# Patient Record
Sex: Female | Born: 1945 | Race: White | Hispanic: No | Smoking: Former smoker
Health system: Southern US, Community
[De-identification: ages and names within clinical notes are randomized; demographics above are authoritative.]

## PROBLEM LIST (undated history)

## (undated) DIAGNOSIS — R918 Other nonspecific abnormal finding of lung field: Secondary | ICD-10-CM

## (undated) DIAGNOSIS — F17200 Nicotine dependence, unspecified, uncomplicated: Secondary | ICD-10-CM

## (undated) DIAGNOSIS — J309 Allergic rhinitis, unspecified: Secondary | ICD-10-CM

## (undated) DIAGNOSIS — R0600 Dyspnea, unspecified: Secondary | ICD-10-CM

## (undated) DIAGNOSIS — R61 Generalized hyperhidrosis: Secondary | ICD-10-CM

## (undated) DIAGNOSIS — J189 Pneumonia, unspecified organism: Secondary | ICD-10-CM

## (undated) DIAGNOSIS — E785 Hyperlipidemia, unspecified: Secondary | ICD-10-CM

## (undated) DIAGNOSIS — J45909 Unspecified asthma, uncomplicated: Secondary | ICD-10-CM

## (undated) DIAGNOSIS — F4321 Adjustment disorder with depressed mood: Secondary | ICD-10-CM

## (undated) DIAGNOSIS — I10 Essential (primary) hypertension: Secondary | ICD-10-CM

## (undated) DIAGNOSIS — Z8701 Personal history of pneumonia (recurrent): Secondary | ICD-10-CM

## (undated) DIAGNOSIS — C801 Malignant (primary) neoplasm, unspecified: Secondary | ICD-10-CM

## (undated) DIAGNOSIS — L299 Pruritus, unspecified: Secondary | ICD-10-CM

## (undated) DIAGNOSIS — F419 Anxiety disorder, unspecified: Secondary | ICD-10-CM

## (undated) DIAGNOSIS — J449 Chronic obstructive pulmonary disease, unspecified: Secondary | ICD-10-CM

## (undated) HISTORY — DX: Hyperlipidemia, unspecified: E78.5

## (undated) HISTORY — DX: Nicotine dependence, unspecified, uncomplicated: F17.200

## (undated) HISTORY — DX: Other nonspecific abnormal finding of lung field: R91.8

## (undated) HISTORY — DX: Pruritus, unspecified: L29.9

## (undated) HISTORY — DX: Chronic obstructive pulmonary disease, unspecified: J44.9

## (undated) HISTORY — DX: Generalized hyperhidrosis: R61

## (undated) HISTORY — DX: Essential (primary) hypertension: I10

## (undated) HISTORY — DX: Allergic rhinitis, unspecified: J30.9

## (undated) HISTORY — DX: Unspecified asthma, uncomplicated: J45.909

## (undated) HISTORY — DX: Adjustment disorder with depressed mood: F43.21

---
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
## 1973-08-20 HISTORY — PX: TOTAL ABDOMINAL HYSTERECTOMY W/ BILATERAL SALPINGOOPHORECTOMY: SHX83

## 1998-07-12 ENCOUNTER — Ambulatory Visit (HOSPITAL_COMMUNITY): Admission: RE | Admit: 1998-07-12 | Discharge: 1998-07-12 | Payer: Self-pay | Admitting: Gastroenterology

## 2000-10-29 ENCOUNTER — Encounter: Admission: RE | Admit: 2000-10-29 | Discharge: 2000-10-29 | Payer: Self-pay | Admitting: Family Medicine

## 2000-10-29 ENCOUNTER — Encounter: Payer: Self-pay | Admitting: Family Medicine

## 2000-11-26 ENCOUNTER — Encounter: Payer: Self-pay | Admitting: Family Medicine

## 2000-11-26 ENCOUNTER — Encounter: Admission: RE | Admit: 2000-11-26 | Discharge: 2000-11-26 | Payer: Self-pay | Admitting: Family Medicine

## 2002-08-31 ENCOUNTER — Ambulatory Visit (HOSPITAL_COMMUNITY): Admission: RE | Admit: 2002-08-31 | Discharge: 2002-08-31 | Payer: Self-pay | Admitting: Gastroenterology

## 2002-08-31 ENCOUNTER — Encounter: Payer: Self-pay | Admitting: Gastroenterology

## 2003-01-27 ENCOUNTER — Encounter: Payer: Self-pay | Admitting: *Deleted

## 2003-01-28 ENCOUNTER — Encounter: Payer: Self-pay | Admitting: Cardiology

## 2003-01-28 ENCOUNTER — Observation Stay (HOSPITAL_COMMUNITY): Admission: EM | Admit: 2003-01-28 | Discharge: 2003-01-28 | Payer: Self-pay | Admitting: *Deleted

## 2005-09-10 ENCOUNTER — Encounter: Admission: RE | Admit: 2005-09-10 | Discharge: 2005-09-10 | Payer: Self-pay | Admitting: Family Medicine

## 2006-08-20 DIAGNOSIS — J45909 Unspecified asthma, uncomplicated: Secondary | ICD-10-CM

## 2006-08-20 HISTORY — DX: Unspecified asthma, uncomplicated: J45.909

## 2007-03-04 ENCOUNTER — Encounter: Admission: RE | Admit: 2007-03-04 | Discharge: 2007-03-04 | Payer: Self-pay | Admitting: Family Medicine

## 2011-01-05 NOTE — H&P (Signed)
NAME:  Pamela Russell, Pamela Russell                        ACCOUNT NO.:  [id number]   MEDICAL RECORD NO.:  [id number]                   PATIENT TYPE:  OBV   LOCATION:  3731                                 FACILITY:  [facility]   PHYSICIAN:  Peter M. Swaziland, M.D.               DATE OF BIRTH:  [birth year]   DATE OF ADMISSION:  01/28/2003  DATE OF DISCHARGE:                                HISTORY & PHYSICAL   HISTORY OF PRESENT ILLNESS:  The patient is a 65 year old white woman who  was admitted to [facility] for further evaluation of chest pain.   The patient has no past history of cardiac disease other than mitral valve  prolapse.  She presented to the emergency department after having two  episodes of chest pain.  These episodes occurred while she was eating dinner  at work.  The chest pain was described as a sharp, focal discomfort located  in the left inframammary region.  It radiated up the left side of her body.  It lased several seconds.  Several minutes later, she experience another  similar episode.  It was also fleeting in duration.  The chest pain was not  associated with dyspnea, diaphoresis or nausea.  There were no exacerbating  or ameliorating factors.  It appeared not to be related to position,  activity, meals or respiration.  The patient did take nitroglycerin (which  she was given in the remote past for chest pain thought to be related to her  mitral valve prolapse) but her chest pain had already resolved by the time  that she took it.   As noted, the patient has a history of mitral valve prolapse.  In the remote  past, she had experienced episodes of chest pain.  These were attributed to  the mitral valve prolapse.  She was given nitroglycerin but had not used it  in a number of years.  The patient had no history of coronary artery  disease, myocardial infarction, congestive heart failure, or arrhythmia.   The patient has a history of hypertension and chronic obstructive  pulmonary  disease.  She smokes one pack of cigarettes a day.  There is no history of  hyperlipidemia, diabetes mellitus, or family history of early coronary  artery disease.   ALLERGIES:  The patient is reportedly allergic to codeine and penicillin.   MEDICATIONS:  She takes a number of medications but is not sure of their  names.  She takes Altace, Premarin, several inhalers, and one other blood  pressure medication.  The name is not known at this time.   SOCIAL HISTORY:  The patient lives with her son.  She is widowed.   FAMILY HISTORY:  Noncontributory.   REVIEW OF SYSTEMS:  No problems related to her head, eyes, ears, nose,  mouth, throat, lungs, gastrointestinal system, or extremities.  There is no  history of neurologic or psychiatric disorder.  There  is no history of  fever, chills, or weight loss.   PHYSICAL EXAMINATION:  VITAL SIGNS:  Blood pressure 169/83, pulse 80 and  regular, respirations 18, temperature 97.1.  GENERAL:  The patient was a thin, middle-aged white woman in no discomfort.  She was alert, oriented, appropriate, and responsive.  HEENT:  Head, eyes, nose and mouth were unremarkable.  NECK:  Without thyromegaly or adenopathy.  Carotid pulses were palpable  bilaterally and without bruits.  CARDIAC:  Examination revealed a normal S1 and S2.  There was no S3, S4,  murmur, rub or click.  Cardiac rhythm was regular.  No chest wall tenderness  was noted.  LUNGS:  Clear.  ABDOMEN:  Soft, nontender.  There was no mas, hepatosplenic, bruits,  distension, rebound, guarding or rigidity.  Bowel sounds are normal.  BREASTS/PELVIS AND RECTAL:  Examinations were not performed as they were not  pertinent to the reason for acute-care hospitalization.  EXTREMITIES:  Without edema, deviation, or deformity.  Radials are palpable.  Pedal pulses were palpable bilaterally.  NEUROLOGIC:  Brief screening neurological survey was unremarkable.   Echocardiogram revealed normal  sinus rhythm. Q-M's were present in V2 and V3  consistent with a prior anteroseptal myocardial infarction.   The chest radiograph revealed a small left upper-lobe nodule.  There was no  focal infiltrate or edema.   LABORATORY DATA:  Initial CK was 37 with CK-MB of 0.9 and troponin 0.01.  White count was 7.7 with a hemoglobin of 12.7 and hematocrit of 36.9.  Potassium was 3.6, BUN 13, creatinine 0.7.  The remaining studies were  pending at the time of this dictation.   IMPRESSION:  1. Chest pain, rule out coronary artery disease.  The pain was sharp,     fleeting and located in a focal region in the left inframmary area.     However, there are anteroseptal Q waves on her electrocardiogram.  2. History of mitral valve prolapse.  3. Hypertension.  4. Chronic obstructive pulmonary disease.  5. Left upper-lobe nodule on chest radiograph.   PLAN:  1. Telemetry.  2. Serial cardiac enzymes.  3. Aspirin.  4. Nitroglycerin paste.  5. Echocardiogram to assess mitral valve prolapse and wall motion.  6. Adenosine Cardiolite.  7.     Fasting lipid profile.  8. Discontinue smoking.  9. Check CT and follow up left upper lobe nodule.     Quita Skye. Waldon Reining, MD                   Peter M. Swaziland, M.D.    MSC/MEDQ  D:  01/28/2003  T:  01/28/2003  Job:  [id number]   cc:   Quita Skye. Waldon Reining, MD  [address]  [place], [place] [address]  Fax: [phone]   Peter M. Swaziland, M.D.  [address], [address]  [place], [place] [address]  Fax: [phone]

## 2011-01-05 NOTE — Discharge Summary (Signed)
NAME:  Pamela Russell, Pamela Russell                        ACCOUNT NO.:  [id number]   MEDICAL RECORD NO.:  [id number]                   PATIENT TYPE:  OBV   LOCATION:  3731                                 FACILITY:  [facility]   PHYSICIAN:  Peter M. [place]aziland, M.D.               DATE OF BIRTH:  June 21, 1946   DATE OF ADMISSION:  01/28/2003  DATE OF DISCHARGE:  01/28/2003                                 DISCHARGE SUMMARY   HISTORY OF PRESENT ILLNESS:  [name] is a 65 year old white female with  a history of tobacco abuse, COPD, and hypertension who presented with  atypical chest pain, described as a focal pain beneath her left breast that  was sharp and fleeting in character.  Because of her risk factors, she was  admitted for further evaluation.  For details of her past medical history,  social history, family history, and physical exam, please see admission  history and physical.   LABORATORY DATA:  ECG showed poor R-wave progression in V1 through V3;  otherwise normal.  White count was 7700, hemoglobin 12.7, hematocrit 36.8,  platelets 190,000.  Sodium 141, potassium 3.6, chloride 106, CO2 of 27, BUN  13, creatinine 0.7, glucose 133.  Coags were normal.  CPK-MB and troponin  were negative times three.  Magnesium was normal at 2.1.  Chest x-ray showed  a tiny left upper lobe nodular density; otherwise, no acute process.  Lipid  panel showed cholesterol 186, LDL of 105, HDL of 60, and triglycerides of  107.   HOSPITAL COURSE:  The patient was admitted to telemetry; she ruled out for  myocardial infarction.  She was treated with aspirin and nitro paste.  She  subsequently underwent an adenosine Cardiolite study, which was normal,  showing no perfusion abnormalities and an ejection fraction of 72%.  She  also had an echocardiogram, which was a normal study, showing normal left  ventricular function and no valvular abnormalities; specifically, there was  no evidence of mitral valve prolapse.  A CT of  the chest was also performed,  which showed no pulmonary nodules or other acute pathology; it was noted  that she had extensive coronary calcification.  Based on these studies, it  was felt that her pain was noncardiac.  She was discharged home on the same  day.  It was recommended that she continue on her prior medications and stop  smoking.   DISCHARGE DIAGNOSES:  1. Atypical chest pain.  2. Tobacco abuse.  3. Chronic obstructive pulmonary disease.  4. Hypertension.   RECOMMENDATIONS:   DISCHARGE MEDICATIONS:  We will continue her prior medications, which  include:  1. Altace 10 mg daily.  2. Premarin 0.625 mg daily.  3. Norvasc 10 mg per day.  4. Advair 250/50 mcg one puff b.i.d.  5. Albuterol inhaler p.r.n.   DISCHARGE INSTRUCTIONS:  She was encouraged to stop smoking.   DIET:  I recommended a low-fat  diet.    FOLLOW UP:  Will follow up with Dr. Arvilla Market in two weeks.   DISCHARGE STATUS:  Improved.                                               Peter M. Swaziland, M.D.    PMJ/MEDQ  D:  01/28/2003  T:  01/29/2003  Job:  [id number]   cc:   Donia Guiles, M.D.  [address] [place]  [place] [address]  Fax: [phone]

## 2013-04-27 ENCOUNTER — Ambulatory Visit (INDEPENDENT_AMBULATORY_CARE_PROVIDER_SITE_OTHER): Payer: Medicare Other | Admitting: Emergency Medicine

## 2013-04-27 VITALS — BP 168/80 | HR 100 | Temp 99.0°F | Resp 20 | Ht 63.0 in | Wt 110.0 lb

## 2013-04-27 DIAGNOSIS — L84 Corns and callosities: Secondary | ICD-10-CM

## 2013-04-27 NOTE — Progress Notes (Signed)
[facility] [address] [place] [address] [phone]  Date:  04/27/2013   Name:  Pamela Russell   DOB:  23-Nov-1945   MRN:  [id number]  PCP:  Hassan Rowan, MD    Chief Complaint: Callouses   History of Present Illness:  Pamela Russell is a 67 y.o. very pleasant female patient who presents with the following:  Has a corn between her great and second toe on apppositional surfaces on left for months and is experiencing pain and difficulty wearing shoes.  No improvement with over the counter medications or other home remedies. Denies other complaint or health concern today.   There are no active problems to display for this patient.   History reviewed. No pertinent past medical history.  History reviewed. No pertinent past surgical history.  History  Substance Use Topics  . Smoking status: Current Every Day Smoker  . Smokeless tobacco: Not on file  . Alcohol Use: Not on file    Family History  Problem Relation Age of Onset  . Heart disease Mother   . Heart disease Father     Allergies  Allergen Reactions  . Codeine Swelling  . Penicillins Swelling    Medication list has been reviewed and updated.  No current outpatient prescriptions on file prior to visit.   No current facility-administered medications on file prior to visit.    Review of Systems:  As per HPI, otherwise negative.    Physical Examination: Filed Vitals:   04/27/13 1446  BP: 168/80  Pulse: 100  Temp: 99 F (37.2 C)  Resp: 20   Filed Vitals:   04/27/13 1446  Height: 5\' 3"  (1.6 m)  Weight: 110 lb (49.896 kg)   Body mass index is 19.49 kg/(m^2). Ideal Body Weight: Weight in (lb) to have BMI = 25: 140.8   GEN: WDWN, NAD, Non-toxic, Alert & Oriented x 3 HEENT: Atraumatic, Normocephalic.  Ears and Nose: No external deformity. EXTR: No clubbing/cyanosis/edema NEURO: Normal gait.  PSYCH: Normally interactive. Conversant. Not depressed or anxious appearing.   Calm demeanor.  LEFT foot corn on first and second toe.  Assessment and Plan: Corn Podiatrist Salicylic acid plasters. Signed,  Phillips Odor, MD

## 2013-04-27 NOTE — Patient Instructions (Addendum)

## 2016-05-08 HISTORY — PX: CATARACT EXTRACTION: SUR2

## 2016-05-22 HISTORY — PX: CATARACT EXTRACTION: SUR2

## 2016-06-08 ENCOUNTER — Ambulatory Visit (INDEPENDENT_AMBULATORY_CARE_PROVIDER_SITE_OTHER): Payer: Medicare Other | Admitting: Family Medicine

## 2016-06-08 ENCOUNTER — Encounter: Payer: Self-pay | Admitting: Family Medicine

## 2016-06-08 VITALS — BP 168/92 | HR 94 | Temp 97.7°F | Ht 63.0 in | Wt 108.6 lb

## 2016-06-08 DIAGNOSIS — R61 Generalized hyperhidrosis: Secondary | ICD-10-CM | POA: Insufficient documentation

## 2016-06-08 DIAGNOSIS — R7309 Other abnormal glucose: Secondary | ICD-10-CM

## 2016-06-08 DIAGNOSIS — J449 Chronic obstructive pulmonary disease, unspecified: Secondary | ICD-10-CM

## 2016-06-08 DIAGNOSIS — J4520 Mild intermittent asthma, uncomplicated: Secondary | ICD-10-CM

## 2016-06-08 DIAGNOSIS — I10 Essential (primary) hypertension: Secondary | ICD-10-CM | POA: Diagnosis not present

## 2016-06-08 DIAGNOSIS — J45909 Unspecified asthma, uncomplicated: Secondary | ICD-10-CM | POA: Insufficient documentation

## 2016-06-08 DIAGNOSIS — F4320 Adjustment disorder, unspecified: Secondary | ICD-10-CM

## 2016-06-08 DIAGNOSIS — N951 Menopausal and female climacteric states: Secondary | ICD-10-CM

## 2016-06-08 DIAGNOSIS — Z0000 Encounter for general adult medical examination without abnormal findings: Secondary | ICD-10-CM | POA: Insufficient documentation

## 2016-06-08 DIAGNOSIS — E559 Vitamin D deficiency, unspecified: Secondary | ICD-10-CM

## 2016-06-08 DIAGNOSIS — F4321 Adjustment disorder with depressed mood: Secondary | ICD-10-CM

## 2016-06-08 HISTORY — DX: Generalized hyperhidrosis: R61

## 2016-06-08 HISTORY — DX: Adjustment disorder with depressed mood: F43.21

## 2016-06-08 HISTORY — DX: Chronic obstructive pulmonary disease, unspecified: J44.9

## 2016-06-08 LAB — COMPLETE METABOLIC PANEL WITH GFR
ALBUMIN: 4.6 g/dL (ref 3.6–5.1)
ALK PHOS: 69 U/L (ref 33–130)
ALT: 16 U/L (ref 6–29)
AST: 16 U/L (ref 10–35)
BILIRUBIN TOTAL: 0.6 mg/dL (ref 0.2–1.2)
BUN: 6 mg/dL — AB (ref 7–25)
CALCIUM: 10 mg/dL (ref 8.6–10.4)
CO2: 28 mmol/L (ref 20–31)
CREATININE: 0.69 mg/dL (ref 0.50–0.99)
Chloride: 100 mmol/L (ref 98–110)
GFR, Est African American: >89 mL/min (ref >=60)
GFR, Est Non African American: 89 mL/min (ref >=60)
GLUCOSE: 102 mg/dL — AB (ref 65–99)
POTASSIUM: 4 mmol/L (ref 3.5–5.3)
SODIUM: 137 mmol/L (ref 135–146)
TOTAL PROTEIN: 7.2 g/dL (ref 6.1–8.1)

## 2016-06-08 LAB — LIPID PANEL
CHOLESTEROL: 239 mg/dL — AB (ref 125–200)
HDL: 82 mg/dL (ref >=46)
LDL Cholesterol: 137 mg/dL — ABNORMAL HIGH (ref <130)
Total CHOL/HDL Ratio: 2.9 Ratio (ref <=5.0)
Triglycerides: 101 mg/dL (ref <150)
VLDL: 20 mg/dL (ref <30)

## 2016-06-08 LAB — POCT GLYCOSYLATED HEMOGLOBIN (HGB A1C): HEMOGLOBIN A1C: 5.6

## 2016-06-08 LAB — TSH: TSH: 0.98 m[IU]/L

## 2016-06-08 MED ORDER — HYDROCHLOROTHIAZIDE 12.5 MG PO TABS: 12.5000 mg | ORAL_TABLET | Freq: Every day | ORAL | 3 refills | Status: DC

## 2016-06-08 NOTE — Assessment & Plan Note (Addendum)
Acute. Lost son 1 year ago as of next week. Patient was tearful but said she was managing fine. Does not seem to impact her ADL. Depression screen neg. Denied SI or HI. Active with friends and activities. Continues to work. Close to daughter but lives alone. - Discussed with patient need to continue to maintain active lifestyle - Educated on resources at [facility] for grieving and counseling options if needed - F/u 1 month

## 2016-06-08 NOTE — Assessment & Plan Note (Addendum)
Uncontrolled. New diagnosis. BP 169/92. Seem chronic given h/o hypertension on encounters. Continues to smoke 2 ppd. Uncertain cardiac history but seems to carry probable COPD history given meds. Used to take aspirin 81 mg daily but stopped in 2014 when told to not take it by physician. - Will start HCTZ 12.5 mg QD - CMET, A1c, lipid panel, TSH pending - Patient to schedule appointment with Dr. Valentina Lucks for smoking cessation - Educated to restrict sodium intake - F/u in 1 month

## 2016-06-08 NOTE — Progress Notes (Signed)
Subjective:   Patient ID: Pamela Russell    DOB: 1946-06-04, 70 y.o. female   MRN: [id number]  CC: Establishing Care  HPI: Pamela Russell is a 70 y.o. female who presents to clinic today to establish care as new patient. Problems discussed today are as follows:  1. Preventative Care: patient was receiving care until 2008 when PCP retired. Has not seen physician since except urgent care. Says she has no complaints today. Has received all of her immunizations. Wants to be checked for diabetes because her sister was recently diagnosed. Also wants flu shot today.   2. Grieving: says she lost her son approximately 1 year ago. Was not initially discussed until patient told me she could not follow up at clinic in 2 weeks because "she would not be good." She was upset but denies depressive feelings. She lives alone but remains active with biking and working part time. Her grievance has not caused a decrease in activity and she is thinking positive. Will be seeing daughter next week. Denies suicidal or homicidal ideations.  3. Tobacco Abuse: ongoing use since age 38. Currently smoking 2 packs per day and wish to stop. Says she has attempted to use nicotine patches and lozenges without success. Does continue to use her Spiriva for what she says is "asthma." rarely uses albuterol inhaler. Denies fevers, chest pain, dyspnea, cough, lightheadedness, fatigue.   ROS: See HPI for pertinent ROS.  [place]: Pertinent past medical, surgical, family, and social history were reviewed and updated as appropriate. Smoking status reviewed.  Medications reviewed. Current Outpatient Prescriptions  Medication Sig Dispense Refill  . albuterol (PROVENTIL HFA;VENTOLIN HFA) 108 (90 BASE) MCG/ACT inhaler Inhale 2 puffs into the lungs every 6 (six) hours as needed for wheezing.    . Black Cohosh-Soy-Ginkgo-Magnol (ESTROVEN MOOD & MEMORY PO) Take 1 tablet by mouth.    . diphenhydrAMINE (BENADRYL) 25 MG tablet Take 25 mg by  mouth every 6 (six) hours as needed.    Marland Kitchen glucosamine-chondroitin 500-400 MG tablet Take 1 tablet by mouth once.    Marland Kitchen guaifenesin (HUMIBID E) 400 MG TABS tablet Take 400 mg by mouth once.    . Multiple Vitamin (MULTIVITAMIN) tablet Take 1 tablet by mouth daily.    Marland Kitchen tiotropium (SPIRIVA) 18 MCG inhalation capsule Place 18 mcg into inhaler and inhale daily.    Marland Kitchen aspirin 81 MG tablet Take 81 mg by mouth daily.    . hydrochlorothiazide (HYDRODIURIL) 12.5 MG tablet Take 1 tablet (12.5 mg total) by mouth daily. 90 tablet 3   No current facility-administered medications for this visit.     Objective:   BP (!) 168/92   Pulse 94   Temp 97.7 F (36.5 C) (Oral)   Ht '5\' 3"'$  (1.6 m)   Wt 108 lb 9.6 oz (49.3 kg)   BMI 19.24 kg/m  Vitals and nursing note reviewed.  General: thin, well nourished, well developed, in no acute distress with non-toxic appearance HEENT: normocephalic, atraumatic, moist mucous membranes Neck: supple, non-tender without lymphadenopathy CV: regular rate and rhythm without murmurs, rubs, or gallops Lungs: clear to auscultation bilaterally with normal work of breathing Abdomen: soft, non-tender, non-distended, no masses or organomegaly palpable, normoactive bowel sounds Skin: warm, dry, no rashes or lesions, cap refill < 2 seconds Extremities: warm and well perfused, normal tone Psych: anxious and grieving  Assessment & Plan:   Essential hypertension, benign Uncontrolled. New diagnosis. BP 169/92. Seem chronic given h/o hypertension on encounters. Continues to smoke 2 ppd.  Uncertain cardiac history but seems to carry probable COPD history given meds. Used to take aspirin 81 mg daily but stopped in 2014 when told to not take it by physician. - Will start HCTZ 12.5 mg QD - CMET, A1c, lipid panel, TSH pending - Patient to schedule appointment with Dr. Valentina Lucks for smoking cessation - Educated to restrict sodium intake - F/u in 1 month  Chronic obstructive pulmonary disease  (COPD) ([place]) Chronic since 2008. Patient continues to smoke 2 ppd. Significant ~60 pack year history. Says she has "asthma" but patient does not seem to understand why. On SABA and Spiriva which is more consistent with COPD. Other risk factors include ridding motorcycles often and working in [employer], nature as lab tech, uses mask at all times). - Proventil inhaler PRN - Spiriva 18 mcg inhaler QD - Spent majority of time counseling smoking cessation, patient agreeable to stop, referred to Dr. Valentina Lucks for smoking cessation - Encouraged patient to continue wearing mask at work - F/u 1 month  Encounter for screening and preventative care Establishing care today. No concerns per patient. Has not received health care in last decade due to no PCP. Concerned she is not aware of her medical problems because she says she has asthma but is on COPD medications and continues to smoke. No cardiac history per patient. - Will discuss colonoscopy/mammogram/DEXA during next visit - Discussed need for daily Aspirin 81 mg, patient will start taking considering her smoking history - Up to date on flu and other vaccinations - A1c, TSH, CMET, lipid panel, vit D level pending - F/u in 1 month  Grieving Acute. Lost son 1 year ago as of next week. Patient was tearful but said she was managing fine. Does not seem to impact her ADL. Depression screen neg. Denied SI or HI. Active with friends and activities. Continues to work. Close to daughter but lives alone. - Discussed with patient need to continue to maintain active lifestyle - Educated on resources at [facility] for grieving and counseling options if needed - F/u 1 month  Orders Placed This Encounter  Procedures  . COMPLETE METABOLIC PANEL WITH GFR  . Lipid panel  . TSH  . VITAMIN D 25 Hydroxy (Vit-D Deficiency, Fractures)  . POCT glycosylated hemoglobin (Hb A1C)   Meds ordered this encounter  Medications  . glucosamine-chondroitin 500-400 MG tablet     Sig: Take 1 tablet by mouth once.  . Multiple Vitamin (MULTIVITAMIN) tablet    Sig: Take 1 tablet by mouth daily.  . Black Cohosh-Soy-Ginkgo-Magnol (ESTROVEN MOOD & MEMORY PO)    Sig: Take 1 tablet by mouth.  . guaifenesin (HUMIBID E) 400 MG TABS tablet    Sig: Take 400 mg by mouth once.  . diphenhydrAMINE (BENADRYL) 25 MG tablet    Sig: Take 25 mg by mouth every 6 (six) hours as needed.  . hydrochlorothiazide (HYDRODIURIL) 12.5 MG tablet    Sig: Take 1 tablet (12.5 mg total) by mouth daily.    Dispense:  90 tablet    Refill:  [place], [place], PGY-1 06/08/2016 12:03 PM

## 2016-06-08 NOTE — Assessment & Plan Note (Addendum)
Establishing care today. No concerns per patient. Has not received health care in last decade due to no PCP. Concerned she is not aware of her medical problems because she says she has asthma but is on COPD medications and continues to smoke. No cardiac history per patient. - Will discuss colonoscopy/mammogram/DEXA during next visit - Discussed need for daily Aspirin 81 mg, patient will start taking considering her smoking history - Up to date on flu and other vaccinations - A1c, TSH, CMET, lipid panel, vit D level pending - F/u in 1 month

## 2016-06-08 NOTE — Assessment & Plan Note (Deleted)
A 

## 2016-06-08 NOTE — Assessment & Plan Note (Addendum)
Chronic since 2008. Patient continues to smoke 2 ppd. Significant ~120 pack year history. Says she has "asthma" but patient does not seem to understand why. On SABA and Spiriva which is more consistent with COPD. Other risk factors include ridding motorcycles often and working in [employer], nature as lab tech, uses mask at all times). - Proventil inhaler PRN - Spiriva 18 mcg inhaler QD - Spent majority of time counseling smoking cessation, patient agreeable to stop, referred to Dr. Valentina Lucks for smoking cessation - Encouraged patient to continue wearing mask at work - F/u 1 month

## 2016-06-08 NOTE — Patient Instructions (Addendum)
It was a pleasure to meet you today. Please see below to review our plan for today's visit.  1. Your blood pressure is elevated. We will start you on a medication called HCTZ. Please take this daily. 2. You will be notified of your blood work. 3. Please return in 2 weeks.   Please call the clinic at [phone] if your symptoms worsen or you have any concerns. It was my pleasure to see you. -- Harriet Butte, [place], PGY-1   Hypertension Hypertension, commonly called high blood pressure, is when the force of blood pumping through your arteries is too strong. Your arteries are the blood vessels that carry blood from your heart throughout your body. A blood pressure reading consists of a higher number over a lower number, such as 110/72. The higher number (systolic) is the pressure inside your arteries when your heart pumps. The lower number (diastolic) is the pressure inside your arteries when your heart relaxes. Ideally you want your blood pressure below 120/80. Hypertension forces your heart to work harder to pump blood. Your arteries may become narrow or stiff. Having untreated or uncontrolled hypertension can cause heart attack, stroke, kidney disease, and other problems. RISK FACTORS Some risk factors for high blood pressure are controllable. Others are not.  Risk factors you cannot control include:   Race. You may be at higher risk if you are African American.  Age. Risk increases with age.  Gender. Men are at higher risk than women before age 57 years. After age 52, women are at higher risk than men. Risk factors you can control include:  Not getting enough exercise or physical activity.  Being overweight.  Getting too much fat, sugar, calories, or salt in your diet.  Drinking too much alcohol. SIGNS AND SYMPTOMS Hypertension does not usually cause signs or symptoms. Extremely high blood pressure (hypertensive crisis) may cause headache, anxiety,  shortness of breath, and nosebleed. DIAGNOSIS To check if you have hypertension, your health care provider will measure your blood pressure while you are seated, with your arm held at the level of your heart. It should be measured at least twice using the same arm. Certain conditions can cause a difference in blood pressure between your right and left arms. A blood pressure reading that is higher than normal on one occasion does not mean that you need treatment. If it is not clear whether you have high blood pressure, you may be asked to return on a different day to have your blood pressure checked again. Or, you may be asked to monitor your blood pressure at home for 1 or more weeks. TREATMENT Treating high blood pressure includes making lifestyle changes and possibly taking medicine. Living a healthy lifestyle can help lower high blood pressure. You may need to change some of your habits. Lifestyle changes may include:  Following the DASH diet. This diet is high in fruits, vegetables, and whole grains. It is low in salt, red meat, and added sugars.  Keep your sodium intake below 2,300 mg per day.  Getting at least 30-45 minutes of aerobic exercise at least 4 times per week.  Losing weight if necessary.  Not smoking.  Limiting alcoholic beverages.  Learning ways to reduce stress. Your health care provider may prescribe medicine if lifestyle changes are not enough to get your blood pressure under control, and if one of the following is true:  You are 38-4 years of age and your systolic blood pressure is above 140.  You are 61 years of age or older, and your systolic blood pressure is above 150.  Your diastolic blood pressure is above 90.  You have diabetes, and your systolic blood pressure is over 505 or your diastolic blood pressure is over 90.  You have kidney disease and your blood pressure is above 140/90.  You have heart disease and your blood pressure is above 140/90. Your  personal target blood pressure may vary depending on your medical conditi ons, your age, and other factors. HOME CARE INSTRUCTIONS  Have your blood pressure rechecked as directed by your health care provider.   Take medicines only as directed by your health care provider. Follow the directions carefully. Blood pressure medicines must be taken as prescribed. The medicine does not work as well when you skip doses. Skipping doses also puts you at risk for problems.  Do not smoke.   Monitor your blood pressure at home as directed by your health care provider. SEEK MEDICAL CARE IF:   You think you are having a reaction to medicines taken.  You have recurrent headaches or feel dizzy.  You have swelling in your ankles.  You have trouble with your vision. SEEK IMMEDIATE MEDICAL CARE IF:  You develop a severe headache or confusion.  You have unusual weakness, numbness, or feel faint.  You have severe chest or abdominal pain.  You vomit repeatedly.  You have trouble breathing. MAKE SURE YOU:   Understand these instructions.  Will watch your condition.  Will get help right away if you are not doing well or get worse.   This information is not intended to replace advice given to you by your health care provider. Make sure you discuss any questions you have with your health care provider.   Document Released: 08/06/2005 Document Revised: 12/21/2014 Document Reviewed: 05/29/2013 Elsevier Interactive Patient Education [employer].

## 2016-06-09 LAB — VITAMIN D 25 HYDROXY (VIT D DEFICIENCY, FRACTURES): VIT D 25 HYDROXY: 43 ng/mL (ref 30–100)

## 2016-06-11 ENCOUNTER — Encounter: Payer: Self-pay | Admitting: Family Medicine

## 2016-07-06 ENCOUNTER — Ambulatory Visit (INDEPENDENT_AMBULATORY_CARE_PROVIDER_SITE_OTHER): Payer: Medicare Other | Admitting: Family Medicine

## 2016-07-06 ENCOUNTER — Encounter: Payer: Self-pay | Admitting: Family Medicine

## 2016-07-06 DIAGNOSIS — Z720 Tobacco use: Secondary | ICD-10-CM

## 2016-07-06 DIAGNOSIS — I10 Essential (primary) hypertension: Secondary | ICD-10-CM | POA: Diagnosis not present

## 2016-07-06 DIAGNOSIS — F17200 Nicotine dependence, unspecified, uncomplicated: Secondary | ICD-10-CM

## 2016-07-06 HISTORY — DX: Nicotine dependence, unspecified, uncomplicated: F17.200

## 2016-07-06 MED ORDER — MOMETASONE FUROATE 0.1 % EX SOLN: Freq: Every day | CUTANEOUS | 0 refills | Status: DC

## 2016-07-06 MED ORDER — TETANUS-DIPHTH-ACELL PERTUSSIS 5-2-15.5 LF-MCG/0.5 IM SUSP: 0.5000 mL | Freq: Once | INTRAMUSCULAR | 0 refills | Status: AC

## 2016-07-06 MED ORDER — HYDROCHLOROTHIAZIDE 25 MG PO TABS: 25.0000 mg | ORAL_TABLET | Freq: Every day | ORAL | 3 refills | Status: DC

## 2016-07-06 NOTE — Assessment & Plan Note (Addendum)
Chronic. Improved. Started HCTZ 12.5 mg last visit 1 month ago with improvement but not at goal for BP. Patient tolerating well. Says she feels better than last visit. --Increase HCTZ to 25 mg QD --Educated on smoking cessation, actively cutting back, congratulated patient for effort --Avoid high salt intake --Will f/u in 1 month to evaluate BP

## 2016-07-06 NOTE — Progress Notes (Signed)
Subjective:   Patient ID: Pamela Russell    DOB: 12/31/1945, 70 y.o. female   MRN: [id number]  CC: "High blood pressure follow up"  HPI: Pamela Russell is a 70 y.o. female who presents to clinic today for f/u concerning BP and smoking. Problems discussed today are as follows:  1. Tobacco Abuse: Long h/o smoking cigs. Has been working on cutting back. Has tried nicotine in past without relief. Says she tries non-nicotine alternatives with some success. Has reduced smoking from 2 packs to 1 pack per day over past month. Is very encouraged with her success.  2. Hypertension: Blood pressure elevated in past with medication for past decade prior to coming to clinic 1 month ago. Says she smokes and is working on cutting back. Takes ASA daily.   ROS: Complete ROS performed, see HPI for pertinent ROS.  [place]: Pertinent past medical, surgical, family, and social history were reviewed and updated as appropriate. Smoking status reviewed.  Medications reviewed. Current Outpatient Prescriptions  Medication Sig Dispense Refill  . albuterol (PROVENTIL HFA;VENTOLIN HFA) 108 (90 BASE) MCG/ACT inhaler Inhale 2 puffs into the lungs every 6 (six) hours as needed for wheezing.    Marland Kitchen aspirin 81 MG tablet Take 81 mg by mouth daily.    . Black Cohosh-Soy-Ginkgo-Magnol (ESTROVEN MOOD & MEMORY PO) Take 1 tablet by mouth.    Marland Kitchen glucosamine-chondroitin 500-400 MG tablet Take 1 tablet by mouth once.    Marland Kitchen guaifenesin (HUMIBID E) 400 MG TABS tablet Take 400 mg by mouth once.    . Multiple Vitamin (MULTIVITAMIN) tablet Take 1 tablet by mouth daily.    Marland Kitchen tiotropium (SPIRIVA) 18 MCG inhalation capsule Place 18 mcg into inhaler and inhale daily.    . hydrochlorothiazide (HYDRODIURIL) 25 MG tablet Take 1 tablet (25 mg total) by mouth daily. 90 tablet 3  . mometasone (ELOCON) 0.1 % lotion Apply topically daily. 30 mL 0  . Tdap (ADACEL) 12-19-13.5 LF-MCG/0.5 injection Inject 0.5 mLs into the muscle once. 0.5 mL 0   No  current facility-administered medications for this visit.     Objective:   BP (!) 154/79 (BP Location: Right Arm, Patient Position: Sitting, Cuff Size: Normal)   Pulse (!) 107   Temp 97.7 F (36.5 C) (Oral)   Wt 109 lb (49.4 kg)   BMI 19.31 kg/m  Vitals and nursing note reviewed.  General: well nourished, well developed, in no acute distress with non-toxic appearance HEENT: normocephalic, atraumatic, moist mucous membranes CV: regular rate and rhythm without murmurs, rubs, or gallops, no lower extremity edema Lungs: clear to auscultation bilaterally with normal work of breathing Abdomen: soft, non-tender, non-distended, normoactive bowel sounds Skin: warm, dry, no rashes or lesions, cap refill < 2 seconds Extremities: warm and well perfused, normal tone  Assessment & Plan:   Essential hypertension, benign Chronic. Improved. Started HCTZ 12.5 mg last visit 1 month ago with improvement but not at goal for BP. Patient tolerating well. Says she feels better than last visit. --Increase HCTZ to 25 mg QD --Educated on smoking cessation, actively cutting back, congratulated patient for effort --Avoid high salt intake --Will f/u in 1 month to evaluate BP  Tobacco abuse Began smoking age 43. Was smoking 2 ppd but cut back over past month to 1 ppd. Patient in good spirits and appears motivated. --Offered assistance with nicotine patch but declined --Patient wants to cut back w/o nicotine and would be open to seeing Dr. Valentina Lucks at next visit if unable --Congratulated patient  on success --Goal to cut back 1/2 pack over next month --F/u in 1 month  No orders of the defined types were placed in this encounter.  Meds ordered this encounter  Medications  . mometasone (ELOCON) 0.1 % lotion    Sig: Apply topically daily.    Dispense:  30 mL    Refill:  0  . hydrochlorothiazide (HYDRODIURIL) 25 MG tablet    Sig: Take 1 tablet (25 mg total) by mouth daily.    Dispense:  90 tablet    Refill:   3  . Tdap (ADACEL) 12-19-13.5 LF-MCG/0.5 injection    Sig: Inject 0.5 mLs into the muscle once.    Dispense:  0.5 mL    Refill:  0    Harriet Butte, DO [place], PGY-1 07/06/2016 5:59 PM

## 2016-07-06 NOTE — Patient Instructions (Signed)
It was a pleasure to meet you today. Please see below to review our plan for today's visit.  1. I will increase you HCTZ to 25 mg daily. We will recheck your blood pressure at that time. 2. Congratulations on cutting back on your smoking! I would like to challenge you to continue cutting back. Please know we have resources here to help if you need. 3. Please get your mammogram after thanksgiving. 4. I have written you a prescription for your ear drops. 5. Take the Tdap prescription and see how much it would cost to get the shot. 6. Return in 1 month.  Please call the clinic at [phone] if your symptoms worsen or you have any concerns. It was my pleasure to see you. -- Harriet Butte, [place], PGY-1   Steps to Quit Smoking Smoking tobacco can be bad for your health. It can also affect almost every organ in your body. Smoking puts you and people around you at risk for many serious long-lasting (chronic) diseases. Quitting smoking is hard, but it is one of the best things that you can do for your health. It is never too late to quit. What are the benefits of quitting smoking? When you quit smoking, you lower your risk for getting serious diseases and conditions. They can include:  Lung cancer or lung disease.  Heart disease.  Stroke.  Heart attack.  Not being able to have children (infertility).  Weak bones (osteoporosis) and broken bones (fractures). If you have coughing, wheezing, and shortness of breath, those symptoms may get better when you quit. You may also get sick less often. If you are pregnant, quitting smoking can help to lower your chances of having a baby of low birth weight. What can I do to help me quit smoking? Talk with your doctor about what can help you quit smoking. Some things you can do (strategies) include:  Quitting smoking totally, instead of slowly cutting back how much you smoke over a period of time.  Going to in-person  counseling. You are more likely to quit if you go to many counseling sessions.  Using resources and support systems, such as:  Online chats with a Social worker.  Phone quitlines.  Printed Furniture conservator/restorer.  Support groups or group counseling.  Text messaging programs.  Mobile phone apps or applications.  Taking medicines. Some of these medicines may have nicotine in them. If you are pregnant or breastfeeding, do not take any medicines to quit smoking unless your doctor says it is okay. Talk with your doctor about counseling or other things that can help you. Talk with your doctor about using more than one strategy at the same time, such as taking medicines while you are also going to in-person counseling. This can help make quitting easier. What things can I do to make it easier to quit? Quitting smoking might feel very hard at first, but there is a lot that you can do to make it easier. Take these steps:  Talk to your family and friends. Ask them to support and encourage you.  Call phone quitlines, reach out to support groups, or work with a Social worker.  Ask people who smoke to not smoke around you.  Avoid places that make you want (trigger) to smoke, such as:  Bars.  Parties.  Smoke-break areas at work.  Spend time with people who do not smoke.  Lower the stress in your life. Stress can make you want to smoke. Try these  things to help your stress:  Getting regular exercise.  Deep-breathing exercises.  Yoga.  Meditating.  Doing a body scan. To do this, close your eyes, focus on one area of your body at a time from head to toe, and notice which parts of your body are tense. Try to relax the muscles in those areas.  Download or buy apps on your mobile phone or tablet that can help you stick to your quit plan. There are many free apps, such as QuitGuide from the [employer] Office manager for Disease Control and Prevention). You can find more support from smokefree.gov and other  websites. This information is not intended to replace advice given to you by your health care provider. Make sure you discuss any questions you have with your health care provider. Document Released: 06/02/2009 Document Revised: 04/03/2016 Document Reviewed: 12/21/2014 Elsevier Interactive Patient Education  2017 [employer].

## 2016-07-06 NOTE — Assessment & Plan Note (Signed)
Began smoking age 70. Was smoking 2 ppd but cut back over past month to 1 ppd. Patient in good spirits and appears motivated. --Offered assistance with nicotine patch but declined --Patient wants to cut back w/o nicotine and would be open to seeing Dr. Valentina Lucks at next visit if unable --Congratulated patient on success --Goal to cut back 1/2 pack over next month --F/u in 1 month

## 2016-07-10 ENCOUNTER — Ambulatory Visit: Payer: Medicare Other | Admitting: Family Medicine

## 2016-08-09 ENCOUNTER — Ambulatory Visit (INDEPENDENT_AMBULATORY_CARE_PROVIDER_SITE_OTHER): Payer: Medicare Other | Admitting: Family Medicine

## 2016-08-09 ENCOUNTER — Encounter: Payer: Self-pay | Admitting: Family Medicine

## 2016-08-09 VITALS — BP 138/62 | HR 95 | Temp 97.7°F | Wt 107.8 lb

## 2016-08-09 DIAGNOSIS — I10 Essential (primary) hypertension: Secondary | ICD-10-CM | POA: Diagnosis not present

## 2016-08-09 DIAGNOSIS — Z720 Tobacco use: Secondary | ICD-10-CM | POA: Diagnosis not present

## 2016-08-09 DIAGNOSIS — Z1159 Encounter for screening for other viral diseases: Secondary | ICD-10-CM | POA: Diagnosis not present

## 2016-08-09 NOTE — Assessment & Plan Note (Signed)
Chronic. Improved. Has 60 pack year hx. Dropped from 2ppd to 1ppd over past 2 months without assistance. Patient appears to be motivated about stopping. Started e-cigs to help reduce cig use over last month. --Patient challenged to drop from 1ppd to 1/2ppd by next visit --Given information about e-cigs and told not an effective option for smoking cessation --Will discuss more smoking cessation options during next visit after the holidays --RTC in 1 month

## 2016-08-09 NOTE — Assessment & Plan Note (Addendum)
Chronic. Controlled. HCTZ increased from 12.5 to 25 mg QD last visit. BP at 138/62 in office. Pt working on smoking cessation. --Continue HCTZ 25 mg QD --Patient instructed to restrict sodium consumption to less than 2000 mg daily --Maintain healthy diet and exercise by walking encouraged --Continue ASA 81 mg QD --Pt instructed to stop smoking

## 2016-08-09 NOTE — Progress Notes (Signed)
   Subjective:   Patient ID: CAYTLYN EVERS    DOB: [birth year], 70 y.o. female   MRN: [id number]  CC: "high blood pressure follow up"  HPI: PEGGYANN ZWIEFELHOFER is a 70 y.o. female who presents to clinic today for HTN and smoking cessation f/u. Problems discussed today are as follows:  HTN: patient given HCTZ for BP control, and was increased to 25 mg QD last visit. Tolerating well w/o side effects. Has also been weaning down cig use and has started e-cigs to help cut down. Patient says she is not interested in nicotine supplements for cessation saying "she needs something to hold in her hand." Patient is optimistic about stopping and does not smoke around son or grandchildren.   Smoking: has been trying to cut back on use. Says she has been able to "occasionally" reduce from 1ppd to 1/2ppd but has "harder days then others." Please refer to what is noted above.  ROS: complete ROS performed, see HPI for pertinent ROS.  [place]: PMH asthma, tobacco abuse, SHx TAHBSO, cataract b/l, FHx heart disease and HTN mother and father. Smoking status reviewed. Medications reviewed.  Objective:   BP 138/62   Pulse 95   Temp 97.7 F (36.5 C)   Wt 107 lb 12.8 oz (48.9 kg)   SpO2 97%   BMI 19.10 kg/m  Vitals and nursing note reviewed.  General: well nourished, well developed, in no acute distress with non-toxic appearance HEENT: normocephalic, atraumatic, moist mucous membranes Neck: supple, non-tender without lymphadenopathy CV: regular rate and rhythm without murmurs, rubs, or gallops, no lower extremity edema Lungs: clear to auscultation bilaterally with normal work of breathing Skin: warm, dry, no rashes or lesions, cap refill < 2 seconds Extremities: warm and well perfused, normal tone  Assessment & Plan:   Essential hypertension, benign Chronic. Controlled. HCTZ increased from 12.5 to 25 mg QD last visit. BP at 138/62 in office. Pt working on smoking cessation. --Continue HCTZ 25 mg  QD --Patient instructed to restrict sodium consumption to less than 2000 mg daily --Maintain healthy diet and exercise by walking encouraged --Continue ASA 81 mg QD --Pt instructed to stop smoking  Tobacco abuse Chronic. Improved. Has 60 pack year hx. Dropped from 2ppd to 1ppd over past 2 months without assistance. Patient appears to be motivated about stopping. Started e-cigs to help reduce cig use over last month. --Patient challenged to drop from 1ppd to 1/2ppd by next visit --Given information about e-cigs and told not an effective option for smoking cessation --Will discuss more smoking cessation options during next visit after the holidays --RTC in 1 month  Orders Placed This Encounter  Procedures  . Hepatitis C antibody   Meds ordered this encounter  Medications  . Red Yeast Rice 600 MG CAPS    Sig: Take by mouth.    Harriet Butte, [place], PGY-1 08/09/2016 4:16 PM

## 2016-08-09 NOTE — Patient Instructions (Signed)
It was a pleasure to meet you today. Please see below to review our plan for today's visit.  1. Blood pressure is now controlled with HCTZ 25 mg daily. PLease continue taking this along with aspirin daily. Restrict salt intake to 2000 mg daily. Most important is to STOP SMOKING! Please see the information below concerning e-cigs. 2. You have made great progress with smoking cessation. Please cut down you packs to 1/2 pack per day this month until I see you again. We will revisit this during our next visit. 3. I will notify you of your blood tests. 4. Return to clinic in 1 month.  HAVE A MERRY CHRISTMAS!  Please call the clinic at [phone] if your symptoms worsen or you have any concerns. It was my pleasure to see you. -- Harriet Butte, [place], PGY-1   What You Need to Know About Electronic Cigarettes Electronic cigarettes, or e-cigarettes, are battery-operated devices that deliver nicotine to your body. They come in many shapes, including in the shape of a cigarette, pipe, pen, and even a USB memory stick. E-cigarettes have a cartridge that contains a liquid form of nicotine. When you use the device, the liquid heats up. It then becomes a vapor. Inhaling this vapor is called vaping. While e-cigarettes do not contain tar and the same cancer-causing chemicals that are in tobacco cigarettes, they may contain other harmful and cancer-causing chemicals, such as formaldehyde or acetaldehyde. Nicotine is thought to increase your risk for certain types of cancer. Many e-cigarettes have chemical colorings and flavorings. It is not clear how much nicotine you get when vaping. The health effects of vaping are not completely known. Some people may use e-cigarettes in order to quit smoking tobacco. However, this has not been proven to work, and the Transport planner (FDA) has not approved e-cigarettes for this purpose. How can using electronic cigarettes affect  me?  E-cigarettes contain nicotine, which is a very addictive drug. Vaping may make you crave nicotine. Nicotine:  Changes your blood sugar levels.  Increases your heart rate, blood pressure, and breathing rate.  Increases your risk of developing blood clots (hypercoaguable state) and diabetes.  If you smoke e-cigarettes, you may be more likely to start smoking or to smoke more tobacco cigarettes.  Becoming addicted to nicotine may make your brain more sensitive to other addictive drugs. You may move to other addictive substances.  If you are pregnant, the nicotine in e-cigarettes may be harmful to your baby. Nicotine can cause:  Brain or lung problems for your baby.  Your baby to be born too early.  Your baby to be born with a low birth weight.  If you are a child or a teen, vaping may affect your memory or lower your attention span.  You may be in danger of overdosing on nicotine. Nicotine poisoning can cause nausea, vomiting, seizures, and trouble breathing. What are the benefits of stopping vaping? If you stop vaping, you can avoid:  Getting addicted to nicotine.  Having nicotine side effects.  Getting nicotine poisoning.  Being exposed to dangerous chemicals.  Increasing your risk of health problems.  Increasing your baby's risk of health problems, if you are pregnant.  Being more likely to use other addictive substances. What steps can I take to stop vaping? If you can stop vaping on your own, do it before you become addicted to nicotine. If you need help stopping, ask your health care provider. There are three effective ways  to fight nicotine addiction:  Nicotine replacement therapy. Using nicotine gum or a nicotine patch blocks your craving for nicotine. Over time, you can reduce the amount of nicotine you use until you can stop using nicotine completely without having cravings.  Prescription medicines approved to fight nicotine addiction. These stop nicotine  cravings or block the effects of nicotine.  Behavioral therapy. This may include:  A self-help smoking cessation program.  Individual or group therapy.  A smoking cessation support group. Where can I get support? You can get support at these sites:  [place] [employer]: MusicTeasers.nl  U.S. Department of Health and Human Services: https://smokefree.gov  American Lung Association: WealthToys.de Where can I get more information? Learn more about e-cigarettes from:  [place]: PicCapture.uy  U.S. Department of Health and Human Services: StoreMirror.com.cy Summary  E-cigarettes can cause nicotine addiction.  E-cigarettes are not approved as a way to stop smoking.  E-cigarettes are not a risk-free alternative to smoking tobacco.  There are ways to fight nicotine addiction.  Talk to your health care provider if you are unable to stop vaping on your own. This information is not intended to replace advice given to you by your health care provider. Make sure you discuss any questions you have with your health care provider. Document Released: 11/28/2015 Document Revised: 04/25/2016 Document Reviewed: 07/29/2015 Elsevier Interactive Patient Education  2017 [employer].

## 2016-08-10 LAB — HEPATITIS C ANTIBODY: HCV Ab: NEGATIVE

## 2016-09-10 ENCOUNTER — Ambulatory Visit (INDEPENDENT_AMBULATORY_CARE_PROVIDER_SITE_OTHER): Payer: Medicare Other | Admitting: Family Medicine

## 2016-09-10 VITALS — BP 125/60 | HR 98 | Temp 98.4°F | Ht 63.0 in | Wt 108.2 lb

## 2016-09-10 DIAGNOSIS — Z720 Tobacco use: Secondary | ICD-10-CM

## 2016-09-10 DIAGNOSIS — E785 Hyperlipidemia, unspecified: Secondary | ICD-10-CM | POA: Insufficient documentation

## 2016-09-10 DIAGNOSIS — E782 Mixed hyperlipidemia: Secondary | ICD-10-CM

## 2016-09-10 HISTORY — DX: Hyperlipidemia, unspecified: E78.5

## 2016-09-10 MED ORDER — ATORVASTATIN CALCIUM 40 MG PO TABS: 40.0000 mg | ORAL_TABLET | Freq: Every day | ORAL | 3 refills | Status: DC

## 2016-09-10 NOTE — Progress Notes (Signed)
Subjective:   Patient ID: Pamela Russell    DOB: 1946-04-14, 71 y.o. female   MRN: [id number]  CC: "stop smoking"  HPI: Pamela Russell is a 71 y.o. female who presents to clinic today for smoking cessation. Problems discussed today are as follows:  Smoking: Making improvements. Has reduced her packs per day from 1.0 to 0.5 over the course of 2 weeks. States today she has only smoked 2 cigs since this morning. Purchased an e-cig a few months ago to "give her something to hold" and to help reduced the number of cigs she smokes, but she has not been able to learn how to use it. Patient says she keeps herself busy in order to help cut down on smoking. She does this by cleaning, yardwork, and riding her bike with friends. She has used nicotine products to help wean off cigs including patches and nicotine. Has COPD and uses albuterol x1 per month. On Spiriva. Denies any symptoms of SOB, CP, cough, or wheeze.  High Cholesterol: Patient has been taking natural substance called red yeast rice to help with her cholesterol. States she tries to avoid eating foods rich in fat and salt. Patient walks for exercise but has been less active with the cold weather but intends to get out as it warms up. Notes she has a family history of heart attacks including her mother at age late 42s early 51s, and father at age 76. Concerned if she is at risk for MI. Says her BP has been well controlled since the increase in HCTZ. Denies any symptoms of palpitations, CP, SOB, change in vision.  ROS: complete ROS performed, see HPI for pertinent ROS.  [place]: HTN, COPD, tobacco abuse, TAHBSO, cataract sx b/l. Smoking status reviewed. Medications reviewed.  Objective:   BP 125/60 (BP Location: Left Arm, Patient Position: Sitting, Cuff Size: Normal)   Pulse 98   Temp 98.4 F (36.9 C) (Oral)   Ht '5\' 3"'$  (1.6 m)   Wt 108 lb 3.2 oz (49.1 kg)   SpO2 98%   BMI 19.17 kg/m  Vitals and nursing note reviewed.  General: well  nourished, well developed, in no acute distress with non-toxic appearance HEENT: normocephalic, atraumatic, moist mucous membranes Neck: supple, non-tender without lymphadenopathy CV: regular rate and rhythm without murmurs, rubs, or gallops, no lower extremity edema Lungs: clear to auscultation bilaterally with normal work of breathing Abdomen: soft, non-tender, non-distended, no masses or organomegaly palpable, normoactive bowel sounds Skin: warm, dry, no rashes or lesions, cap refill < 2 seconds Extremities: warm and well perfused, normal tone  Assessment & Plan:   Tobacco abuse Chronic. Improving. Has a 60 pack year history. Dx of COPD which is controlled. Has reduced from 2 ppd to 1 ppd over 1 month and now down from 1 ppd to 0.5 ppd over 2 months. Using nicotine products. Using healthy methods to cut down. Patient states she is able to cut back on her own. Goal to get to 0 cigs in 2 months time. --Goal 0 cigs daily in 2 months time. Instructed to log #cigs used daily and bring diary to next appt. --Encouraged to continue using healthy lifestyle choices to cut down including exercise. --Will determine Gold criteria next visit. --RTC in 2 months for f/u. Can consider additional methods to assist if unable.  Hyperlipidemia Chronic. Based on last lipid level, would benefit from statin therapy. ASCVD 17.1%. Given strong FHx of MI, both mother and father early 44s, HTN, smoking, will lean  towards high intensity. No h/o MI or stroke/TIA. --Atorvastatin 40 mg QD. --Patient instricted to continue cutting back on smoking. --HTN controlled on HCTZ 25 mg QD. --Making healthy choice and encouraged to exercise. --Check fasting lipid panel at next visit in 2 months.  No orders of the defined types were placed in this encounter.  Meds ordered this encounter  Medications  . atorvastatin (LIPITOR) 40 MG tablet    Sig: Take 1 tablet (40 mg total) by mouth daily.    Dispense:  90 tablet    Refill:   [place], [place], PGY-1 09/11/2016 9:58 AM

## 2016-09-10 NOTE — Patient Instructions (Signed)
It was a pleasure to meet you today. Please see below to review our plan for today's visit.  1. Congratulations on cutting down your cigarettes to half a pack a day! I encourage you to continue trying to cut it down to 0. We will make it a goal for you to return in 2 months at which time your to record how often you smoke daily and bring it to the visit. Please know I have resources to help you if you need until free to call the clinic at any point. 2. Her blood pressure looks controlled on the HCTZ. Please continue taking this medication as prescribed. 3. I will start you on a new medication called atorvastatin (Lipitor). This medication will help reduce your cholesterol level but most importantly will help prevent heart attacks and strokes. I'm starting you on this because of your smoking history, blood pressure, and family history. See attachment below for information. Side effects include muscle pain however this is rare. If this begins please stop the medication and call the clinic. We will recheck your cholesterol level in 2 months. Please make sure you fast when you come to the clinic next time. You can have coffee in the morning feeling like.  Please call the clinic at [phone] if your symptoms worsen or you have any concerns. It was my pleasure to see you. -- Harriet [place], [place], PGY-1  Atorvastatin tablets What is this medicine? ATORVASTATIN (a TORE va sta tin) is known as a HMG-CoA reductase inhibitor or 'statin'. It lowers the level of cholesterol and triglycerides in the blood. This drug may also reduce the risk of heart attack, stroke, or other health problems in patients with risk factors for heart disease. Diet and lifestyle changes are often used with this drug. This medicine may be used for other purposes; ask your health care provider or pharmacist if you have questions. COMMON BRAND NAME(S): Lipitor What should I tell my health care provider before  I take this medicine? They need to know if you have any of these conditions: -frequently drink alcoholic beverages -history of stroke, TIA -kidney disease -liver disease -muscle aches or weakness -other medical condition -an unusual or allergic reaction to atorvastatin, other medicines, foods, dyes, or preservatives -pregnant or trying to get pregnant -breast-feeding How should I use this medicine? Take this medicine by mouth with a glass of water. Follow the directions on the prescription label. You can take this medicine with or without food. Take your doses at regular intervals. Do not take your medicine more often than directed. Talk to your pediatrician regarding the use of this medicine in children. While this drug may be prescribed for children as young as 82 years old for selected conditions, precautions do apply. Overdosage: If you think you have taken too much of this medicine contact a poison control center or emergency room at once. NOTE: This medicine is only for you. Do not share this medicine with others. What if I miss a dose? If you miss a dose, take it as soon as you can. If it is almost time for your next dose, take only that dose. Do not take double or extra doses. What may interact with this medicine? Do not take this medicine with any of the following medications: -red yeast rice -telaprevir -telithromycin -voriconazole This medicine may also interact with the following medications: -alcohol -antiviral medicines for HIV or AIDS -boceprevir -certain antibiotics like clarithromycin, erythromycin, troleandomycin -certain medicines for cholesterol  like fenofibrate or gemfibrozil -cimetidine -clarithromycin -colchicine -cyclosporine -digoxin -female hormones, like estrogens or progestins and birth control pills -grapefruit juice -medicines for fungal infections like fluconazole, itraconazole, ketoconazole -niacin -rifampin -spironolactone This list may not  describe all possible interactions. Give your health care provider a list of all the medicines, herbs, non-prescription drugs, or dietary supplements you use. Also tell them if you smoke, drink alcohol, or use illegal drugs. Some items may interact with your medicine. What should I watch for while using this medicine? Visit your doctor or health care professional for regular check-ups. You may need regular tests to make sure your liver is working properly. Tell your doctor or health care professional right away if you get any unexplained muscle pain, tenderness, or weakness, especially if you also have a fever and tiredness. Your doctor or health care professional may tell you to stop taking this medicine if you develop muscle problems. If your muscle problems do not go away after stopping this medicine, contact your health care professional. This drug is only part of a total heart-health program. Your doctor or a dietician can suggest a low-cholesterol and low-fat diet to help. Avoid alcohol and smoking, and keep a proper exercise schedule. Do not use this drug if you are pregnant or breast-feeding. Serious side effects to an unborn child or to an infant are possible. Talk to your doctor or pharmacist for more information. This medicine may affect blood sugar levels. If you have diabetes, check with your doctor or health care professional before you change your diet or the dose of your diabetic medicine. If you are going to have surgery tell your health care professional that you are taking this drug. What side effects may I notice from receiving this medicine? Side effects that you should report to your doctor or health care professional as soon as possible: -allergic reactions like skin rash, itching or hives, swelling of the face, lips, or tongue -dark urine -fever -joint pain -muscle cramps, pain -redness, blistering, peeling or loosening of the skin, including inside the mouth -trouble passing  urine or change in the amount of urine -unusually weak or tired -yellowing of eyes or skin Side effects that usually do not require medical attention (report to your doctor or health care professional if they continue or are bothersome): -constipation -heartburn -stomach gas, pain, upset This list may not describe all possible side effects. Call your doctor for medical advice about side effects. You may report side effects to FDA at 1-800-FDA-1088. Where should I keep my medicine? Keep out of the reach of children. Store at room temperature between 20 to 25 degrees C (68 to 77 degrees F). Throw away any unused medicine after the expiration date. NOTE: This sheet is a summary. It may not cover all possible information. If you have questions about this medicine, talk to your doctor, pharmacist, or health care provider.  2017 Elsevier/Gold Standard (2011-06-26 81:01:75)

## 2016-09-11 ENCOUNTER — Encounter: Payer: Self-pay | Admitting: Family Medicine

## 2016-09-11 NOTE — Assessment & Plan Note (Addendum)
Chronic. Improving. Has a 60 pack year history. Dx of COPD which is controlled. Has reduced from 2 ppd to 1 ppd over 1 month and now down from 1 ppd to 0.5 ppd over 2 months. Using nicotine products. Using healthy methods to cut down. Patient states she is able to cut back on her own. Goal to get to 0 cigs in 2 months time. --Goal 0 cigs daily in 2 months time. Instructed to log #cigs used daily and bring diary to next appt. --Encouraged to continue using healthy lifestyle choices to cut down including exercise. --Will determine Gold criteria next visit. --RTC in 2 months for f/u. Can consider additional methods to assist if unable.

## 2016-09-11 NOTE — Assessment & Plan Note (Signed)
Chronic. Based on last lipid level, would benefit from statin therapy. ASCVD 17.1%. Given strong FHx of MI, both mother and father early 45s, HTN, smoking, will lean towards high intensity. No h/o MI or stroke/TIA. --Atorvastatin 40 mg QD. --Patient instricted to continue cutting back on smoking. --HTN controlled on HCTZ 25 mg QD. --Making healthy choice and encouraged to exercise. --Check fasting lipid panel at next visit in 2 months.

## 2016-10-03 ENCOUNTER — Other Ambulatory Visit: Payer: Self-pay | Admitting: Family Medicine

## 2016-10-03 ENCOUNTER — Other Ambulatory Visit: Payer: Self-pay | Admitting: *Deleted

## 2016-10-03 DIAGNOSIS — E782 Mixed hyperlipidemia: Secondary | ICD-10-CM

## 2016-10-03 MED ORDER — HYDROCHLOROTHIAZIDE 25 MG PO TABS: 25.0000 mg | ORAL_TABLET | Freq: Every day | ORAL | 3 refills | Status: DC

## 2016-10-03 MED ORDER — ATORVASTATIN CALCIUM 40 MG PO TABS: 40.0000 mg | ORAL_TABLET | Freq: Every day | ORAL | 3 refills | Status: DC

## 2016-10-03 NOTE — Telephone Encounter (Signed)
Needs refill on HCTZ.  [facility].  Phone [phone]

## 2016-11-20 NOTE — Progress Notes (Signed)
   Subjective:   Patient ID: MILISA KIMBELL    DOB: March 28, 1946, 71 y.o. female   MRN: [id number]  CC: "stop smoking"  HPI: LAURIEL HELIN is a 71 y.o. female who presents to clinic today smoking cessation. Problems discussed today are as follows:  Smoking: Now on 2 cigs per day. Using nicotine 21 mg patch daily. Says she feels less of the withdrawal effects. Spends lots of time at home and sometimes has urge to smoke.  Hyperlipidemia: Taking atorvastatin. Says she feels "blah" on it but not sure if it is the statin or the smoking cessation. Denies muscle aches or pains.  Preventative care: Endorses receiving colonoscopy in last few years and told to follow up in 10 years.   COPD: Has been told she has COPD. No formal PFTs done. Taking spirva daily. Minimal use of albuterol.  ROS: complete ROS performed, see HPI for pertinent ROS.  [place]: HTN, COPD, tobacco abuse, TAHBSO, cataract sx b/l. Smoking status reviewed. Medications reviewed.  Objective:   BP 138/86   Pulse (!) 103   Temp 98.3 F (36.8 C) (Oral)   Ht '5\' 3"'$  (1.6 m)   Wt 107 lb (48.5 kg)   SpO2 93%   BMI 18.95 kg/m  Vitals and nursing note reviewed.  General: thin elderly woman, well developed, in no acute distress with non-toxic appearance HEENT: normocephalic, atraumatic, moist mucous membranes Neck: supple, non-tender without lymphadenopathy CV: regular rate and rhythm without murmurs, rubs, or gallops, no lower extremity edema Lungs: clear to auscultation bilaterally with normal work of breathing, slowing of inspiration Abdomen: soft, non-tender, non-distended, normoactive bowel sounds Skin: warm, dry, no rashes or lesions, cap refill < 2 seconds Extremities: warm and well perfused, normal tone  Assessment & Plan:   Hyperlipidemia Chronic. Recently started high intensity statin. Tolerating. No myalgia hx.  --Will obtain LDL, followed by annual fasting lipid panels --Smoking cessation in progress  Tobacco  use disorder [place]. Actively making effirts to stop. On nicotine replacement.  --Continue nicotine 21 mg patch --Will arrange for smoking cessation apt with Dr. Valentina Lucks --RTC in 6 months  Chronic obstructive pulmonary disease (COPD) ([place]) No PFT on file. Pt denies h/o PFT. On Spirva daily with minimal use of albuterol inhaler. --Continue Spiriva QD, albuterol inhaler PRN --Arranged apt with Dr. Valentina Lucks for PFT --See smoking cessation  Orders Placed This Encounter  Procedures  . LDL cholesterol, direct   Meds ordered this encounter  Medications  . atorvastatin (LIPITOR) 40 MG tablet    Sig: Take 1 tablet (40 mg total) by mouth daily.    Dispense:  90 tablet    Refill:  [place], [place], PGY-1 11/21/2016 8:48 PM

## 2016-11-21 ENCOUNTER — Ambulatory Visit (INDEPENDENT_AMBULATORY_CARE_PROVIDER_SITE_OTHER): Payer: Medicare Other | Admitting: Family Medicine

## 2016-11-21 ENCOUNTER — Encounter: Payer: Self-pay | Admitting: Family Medicine

## 2016-11-21 DIAGNOSIS — E782 Mixed hyperlipidemia: Secondary | ICD-10-CM | POA: Diagnosis not present

## 2016-11-21 DIAGNOSIS — J449 Chronic obstructive pulmonary disease, unspecified: Secondary | ICD-10-CM | POA: Diagnosis not present

## 2016-11-21 DIAGNOSIS — F17200 Nicotine dependence, unspecified, uncomplicated: Secondary | ICD-10-CM

## 2016-11-21 DIAGNOSIS — I10 Essential (primary) hypertension: Secondary | ICD-10-CM | POA: Diagnosis not present

## 2016-11-21 MED ORDER — ATORVASTATIN CALCIUM 40 MG PO TABS: 40.0000 mg | ORAL_TABLET | Freq: Every day | ORAL | 3 refills | Status: DC

## 2016-11-21 NOTE — Patient Instructions (Signed)
Thank you for coming in to see [place] today. Please see below to review our plan for today's visit.  1. You done a great job at reducing her cigarette use. Please continue using the nicotine patch. I need you to request an appointment with Dr. Valentina Lucks for smoking cessation when you leave. He can also requests to have your pulmonary function tests performed at this visit. 2. He seemed to be tolerating the atorvastatin well. I will check an LDL (bad cholesterol) level and let you know the results. 3. Return to clinic in 6 months or sooner if needed.  Please call the clinic at [phone] if your symptoms worsen or you have any concerns. It was my pleasure to see you. -- Harriet [place], [place], PGY-1  Nicotine skin patches What is this medicine? NICOTINE ([place] teen) helps people stop smoking. The patches replace the nicotine found in cigarettes and help to decrease withdrawal effects. They are most effective when used in combination with a stop-smoking program. This medicine may be used for other purposes; ask your health care provider or pharmacist if you have questions. COMMON BRAND NAME(S): Habitrol, Nicoderm CQ, Nicotrol What should I tell my health care provider before I take this medicine? They need to know if you have any of these conditions: -diabetes -heart disease, angina, irregular heartbeat or previous heart attack -high blood pressure -lung disease, including asthma -overactive thyroid -pheochromocytoma -seizures or a history of seizures -skin problems, like eczema -stomach problems or ulcers -an unusual or allergic reaction to nicotine, adhesives, other medicines, foods, dyes, or preservatives -pregnant or trying to get pregnant -breast-feeding How should I use this medicine? This medicine is for use on the skin. Follow the directions that come with the patches. Find an area of skin on your upper arm, chest, or back that is clean, dry, greaseless,  undamaged and hairless. Wash hands with plain soap and water. Do not use anything that contains aloe, lanolin or glycerin as these may prevent the patch from sticking. Dry thoroughly. Remove the patch from the sealed pouch. Do not try to cut or trim the patch. Using your palm, press the patch firmly in place for 10 seconds to make sure that there is good contact with your skin. After applying the patch, wash your hands. Change the patch every day, keeping to a regular schedule. When you apply a new patch, use a new area of skin. Wait at least 1 week before using the same area again. Talk to your pediatrician regarding the use of this medicine in children. Special care may be needed. Overdosage: If you think you have taken too much of this medicine contact a poison control center or emergency room at once. NOTE: This medicine is only for you. Do not share this medicine with others. What if I miss a dose? If you forget to replace a patch, use it as soon as you can. Only use one patch at a time and do not leave on the skin for longer than directed. If a patch falls off, you can replace it, but keep to your schedule and remove the patch at the right time. What may interact with this medicine? -medicines for asthma -medicines for blood pressure -medicines for mental depression This list may not describe all possible interactions. Give your health care provider a list of all the medicines, herbs, non-prescription drugs, or dietary supplements you use. Also tell them if you smoke, drink alcohol, or use illegal drugs. Some  items may interact with your medicine. What should I watch for while using this medicine? You should begin using the nicotine patch the day you stop smoking. It is okay if you do not succeed at your attempt to quit and have a cigarette. You can still continue your quit attempt and keep using the product as directed. Just throw away your cigarettes and get back to your quit plan. You can keep  the patch in place during swimming, bathing, and showering. If your patch falls off during these activities, replace it. When you first apply the patch, your skin may itch or burn. This should go away soon. When you remove a patch, the skin may look red, but this should only last for a few days. Call your doctor or health care professional if skin redness does not go away after 4 days, if your skin swells, or if you get a rash. If you are a diabetic and you quit smoking, the effects of insulin may be increased and you may need to reduce your insulin dose. Check with your doctor or health care professional about how you should adjust your insulin dose. If you are going to have a magnetic resonance imaging (MRI) procedure, tell your MRI technician if you have this patch on your body. It must be removed before a MRI. What side effects may I notice from receiving this medicine? Side effects that you should report to your doctor or health care professional as soon as possible: -allergic reactions like skin rash, itching or hives, swelling of the face, lips, or tongue -breathing problems -changes in hearing -changes in vision -chest pain -cold sweats -confusion -fast, irregular heartbeat -feeling faint or lightheaded, falls -headache -increased saliva -skin redness that lasts more than 4 days -stomach pain -signs and symptoms of nicotine overdose like nausea; vomiting; dizziness; weakness; and rapid heartbeat Side effects that usually do not require medical attention (report to your doctor or health care professional if they continue or are bothersome): -diarrhea -dry mouth -hiccups -irritability -nervousness or restlessness -trouble sleeping or vivid dreams This list may not describe all possible side effects. Call your doctor for medical advice about side effects. You may report side effects to FDA at 1-800-FDA-1088. Where should I keep my medicine? Keep out of the reach of children. Store  at room temperature between 20 and 25 degrees C (68 and 77 degrees F). Protect from heat and light. Store in International aid/development worker until ready to use. Throw away unused medicine after the expiration date. When you remove a patch, fold with sticky sides together; put in an empty opened pouch and throw away. NOTE: This sheet is a summary. It may not cover all possible information. If you have questions about this medicine, talk to your doctor, pharmacist, or health care provider.  2018 Elsevier/Gold Standard (2014-07-05 15:46:21)

## 2016-11-21 NOTE — Assessment & Plan Note (Addendum)
[place]. Actively making effirts to stop. On nicotine replacement.  --Continue nicotine 21 mg patch --Will arrange for smoking cessation apt with Dr. Valentina Lucks --RTC in 6 months

## 2016-11-21 NOTE — Assessment & Plan Note (Signed)
No PFT on file. Pt denies h/o PFT. On Spirva daily with minimal use of albuterol inhaler. --Continue Spiriva QD, albuterol inhaler PRN --Arranged apt with Dr. Valentina Lucks for PFT --See smoking cessation

## 2016-11-21 NOTE — Assessment & Plan Note (Addendum)
Chronic. Recently started high intensity statin. Tolerating. No myalgia hx.  --Will obtain LDL, followed by annual fasting lipid panels --Smoking cessation in progress

## 2016-11-22 ENCOUNTER — Encounter: Payer: Self-pay | Admitting: Family Medicine

## 2016-11-22 LAB — LDL CHOLESTEROL, DIRECT: LDL Direct: 86 mg/dL (ref 0–99)

## 2016-11-29 ENCOUNTER — Ambulatory Visit (INDEPENDENT_AMBULATORY_CARE_PROVIDER_SITE_OTHER): Payer: Medicare Other | Admitting: Pharmacist

## 2016-11-29 ENCOUNTER — Encounter: Payer: Self-pay | Admitting: Pharmacist

## 2016-11-29 VITALS — BP 156/88 | HR 102 | Ht 64.0 in | Wt 106.0 lb

## 2016-11-29 DIAGNOSIS — J449 Chronic obstructive pulmonary disease, unspecified: Secondary | ICD-10-CM

## 2016-11-29 DIAGNOSIS — F17200 Nicotine dependence, unspecified, uncomplicated: Secondary | ICD-10-CM

## 2016-11-29 MED ORDER — NICOTINE 21 MG/24HR TD PT24: 21.0000 mg | MEDICATED_PATCH | Freq: Every day | TRANSDERMAL | 1 refills | Status: DC

## 2016-11-29 NOTE — Progress Notes (Signed)
S:    Patient arrives in good spirits. Presents for lung function evaluation. Patient was referred on 11/21/2016. Patient was last seen by Primary Care Provider Dr. Yisroel Ramming on 11/21/2016. Patient reports breathing has been rough lately because the pollen is bothering her but breathing being fine when she is sitting down. She works in her yard frequently and wears a mask.  She reports taking an allergy medication but does not recall the name of it.  She reports previously working in [employer], community with some particle exposure.  She reports working in the yard today and mowing the lawn which causes her breathing to be worse than normal but when caring for her elderly friends and sister who are sick without difficulty breathing.  She has cat/dog/dust/chemical allergen triggers which she reports significantly affect her breathing.    Patient reports last dose of COPD medications was Spiriva (tiotropium) this morning.  She has not required the use of albuterol.   Smoking: Reports the most she smoked was 3 ppd for 20+ years. She started smoking at the age of 44. She reports currently smoking 2-5 cigarettes a day for the past 3 months. She smokes L&L Blue lights 100s. States she is a very hyper and nervous person which smoking helps her calm down. Food and boredum are triggers for smoking. Patient reports she is not sleeping well. Does not wake to smoke and >30 min for first cigarette. She states she enjoys smoking. Cost and frequent doctors visits are motivating her to stop smoking. Reports confidence in quitting smoking within the next 30 days is 4/10. Patient is unable to give answer for motivation for quitting smoking. She reports previously quiting cold [place] for 3 months after being hypnotized about 25 years ago and in 2007 with patches.   O: mMRC score= <2 CAT score= 6 See Documentation Flowsheet - CAT/COPD for complete symptom scoring.  See "scanned report" or Documentation Flowsheet  (discrete results - PFTs) for  Spirometry results. Patient provided good effort while attempting spirometry.   Lung Age = 89 Albuterol Neb  Lot# [id number]  Exp. Nov 19  A/P: COPD: Spirometry evaluation reveals moderately severe obstructive lung disease corresponding to GOLD Classification A, 0 exacerbations in the past year and CAT score of 6.  Post bronchodilator spirometry revealed 10% reversibility in FEV1.   -Reviewed results of pulmonary function tests.   -Continue Spiriva 18 mcg daily and albuterol as needed -Consider addition of inhaled corticosteroid or LABA if breathing worsens.   -Followup OTC allergy medication during telephone followup in 1 week as patient reports multiple allergen/environmental triggers for breathing.   Tobacco Abuse: 50 year smoker with history of 3 ppd for 20+ years.  Currently down to 2-5 cigarettes per day.  Patient is fair candidate for success with smoking cessation based on previous quit attempts of 3 month duration. Continue Nicotine 21 mg patches daily.  Will send to pharmacy to see if covered otherwise patient may be eligible for OTC benefit with [employer] for patches.  Encourage to set quit date within the next 2 weeks and to continue tapering down.  Provided with 1800QUITNOW and she plans to call today.  -Consider addition of bupropion at next visit   Pt verbalized understanding of results and education.  Written pt instructions provided.  F/U telephone visit in 1 weeks.   Total time in face to face counseling 60 minutes.  Patient seen with Ida Rogue, PharmD Candidate and Bennye Alm, PharmD, BCPS, PGY2 Resident.  Marland Kitchen

## 2016-11-29 NOTE — Assessment & Plan Note (Signed)
COPD: Spirometry evaluation reveals moderately severe obstructive lung disease corresponding to GOLD Classification A, 0 exacerbations in the past year and CAT score of 6.  Post bronchodilator spirometry revealed 10% reversibility in FEV1.   -Reviewed results of pulmonary function tests.   -Continue Spiriva 18 mcg daily and albuterol as needed -Consider addition of inhaled corticosteroid or LABA if breathing worsens.   -Followup OTC allergy medication during telephone followup in 1 week as patient reports multiple allergen/environmental triggers for breathing.

## 2016-11-29 NOTE — Patient Instructions (Signed)
Continue Spiriva 18 mcg once daily Continue albuterol as needed  Call 1800-QUIT NOW  We will call you in 1 week

## 2016-11-29 NOTE — Assessment & Plan Note (Signed)
Tobacco Abuse: 59 year smoker with history of 3 ppd for 20+ years.  Currently down to 2-5 cigarettes per day.  Patient is fair candidate for success with smoking cessation based on previous quit attempts of 3 month duration. Continue Nicotine 21 mg patches daily.  Will send to pharmacy to see if covered otherwise patient may be eligible for OTC benefit with [employer] for patches.  Encourage to set quit date within the next 2 weeks and to continue tapering down.  Provided with 1800QUITNOW and she plans to call today.  -Consider addition of bupropion at next visit

## 2016-11-30 ENCOUNTER — Telehealth: Payer: Self-pay | Admitting: Pharmacist

## 2016-11-30 NOTE — Progress Notes (Signed)
Patient ID: Pamela Russell, female   DOB: 08/07/1946, 71 y.o.   MRN: [id number] Reviewed: Agree with Dr. Graylin Shiver documentation and management.

## 2016-11-30 NOTE — Telephone Encounter (Signed)
11/30/16  Called patient today to notify of Nicotine patches that were sent to her pharmacy and to discuss other potential options for recieiving patches for free including 1-800-QUIT-NOW and [employer] OTC potential eligibility [phone]; [email or web address]).   Patient states she plans to call 1-800-QUIT-NOW at 9 am today when they open.  Will follow-up in 1 week as planned to assess smoking cessation and COPD.  Bennye Alm, PharmD, [place] PGY2 Pharmacy Resident [phone]

## 2016-12-03 ENCOUNTER — Encounter: Payer: Self-pay | Admitting: Family Medicine

## 2017-05-20 NOTE — Progress Notes (Deleted)
   Subjective:   Patient ID: LADAWNA WALGREN    DOB: Oct 30, 1945, 71 y.o. female   MRN: [id number]  CC: "***"  HPI: TALEAH BELLANTONI is a 71 y.o. female who presents to clinic today ***. Problems discussed today are as follows:  ***: *** ROS: ***  ***Last seen 11/2016.   Complete ROS performed, see HPI for pertinent.  [place]: HTN, COPD, tobacco abuse. Surgical history TAHBSO, cataract b/l. Family history heart disease, stroke, DM. Smoking status reviewed. Medications reviewed.  Objective:   There were no vitals taken for this visit. Vitals and nursing note reviewed.  General: well nourished, well developed, in no acute distress with non-toxic appearance HEENT: normocephalic, atraumatic, moist mucous membranes Neck: supple, non-tender without lymphadenopathy CV: regular rate and rhythm without murmurs, rubs, or gallops, no lower extremity edema Lungs: clear to auscultation bilaterally with normal work of breathing Abdomen: soft, non-tender, non-distended, no masses or organomegaly palpable, normoactive bowel sounds Skin: warm, dry, no rashes or lesions, cap refill < 2 seconds Extremities: warm and well perfused, normal tone  Assessment & Plan:   No problem-specific Assessment & Plan notes found for this encounter.  No orders of the defined types were placed in this encounter.  No orders of the defined types were placed in this encounter.   Harriet Butte, [place], PGY-2 05/20/2017 3:32 PM

## 2017-05-21 ENCOUNTER — Ambulatory Visit: Payer: Medicare Other | Admitting: Family Medicine

## 2017-05-27 ENCOUNTER — Encounter: Payer: Self-pay | Admitting: Family Medicine

## 2017-05-27 ENCOUNTER — Ambulatory Visit (INDEPENDENT_AMBULATORY_CARE_PROVIDER_SITE_OTHER): Payer: Medicare Other | Admitting: Family Medicine

## 2017-05-27 VITALS — BP 172/96 | HR 98 | Temp 98.1°F | Wt 104.0 lb

## 2017-05-27 DIAGNOSIS — I10 Essential (primary) hypertension: Secondary | ICD-10-CM

## 2017-05-27 DIAGNOSIS — N951 Menopausal and female climacteric states: Secondary | ICD-10-CM

## 2017-05-27 DIAGNOSIS — F17200 Nicotine dependence, unspecified, uncomplicated: Secondary | ICD-10-CM | POA: Diagnosis not present

## 2017-05-27 DIAGNOSIS — R232 Flushing: Secondary | ICD-10-CM | POA: Diagnosis not present

## 2017-05-27 DIAGNOSIS — Z23 Encounter for immunization: Secondary | ICD-10-CM | POA: Diagnosis not present

## 2017-05-27 DIAGNOSIS — E782 Mixed hyperlipidemia: Secondary | ICD-10-CM | POA: Diagnosis not present

## 2017-05-27 DIAGNOSIS — Z122 Encounter for screening for malignant neoplasm of respiratory organs: Secondary | ICD-10-CM | POA: Diagnosis not present

## 2017-05-27 NOTE — Progress Notes (Signed)
Subjective:   Patient ID: Pamela Russell    DOB: 07-18-1946, 71 y.o. female   MRN: [id number]  CC: "High blood pressure"  HPI: Pamela Russell is a 71 y.o. female who presents to clinic today for hypertension. Problems discussed today are as follows:  Hypertension: Patient states she has taken her blood pressure meds this morning. She is compliant with her HCTZ does not miss a dose during the week. She continues to smoke on average 5 cigarettes per day. ROS: Denies chest pain, shortness of breath, cough, palpitations.  Smoker: Patient smokes on average 5 cigarettes per day. States she doesn't smoke or then 7-8 cigarettes in a day. She is currently using nicotine patches with some improvement. This is a stressful time for her given the anniversary of her oldest son's death. ROS: Denies hemoptysis, change in weight, shortness of breath, chest pain.  Night sweats: Ongoing issue almost nightly for the past 3-4 years. Patient attributes this to menopause though she states she went through menopause back in her late 48s. She states she underwent a hysterectomy. She has not taking any medications except for her over-the-counter black co-hosh. ROS: Denies fevers or chills, cough, change in weight, palpitations, diarrhea.  Complete ROS performed, see HPI for pertinent.  [place]: HTN, COPD, tobacco abuse. Surgical history TAHBSO, cataract b/l. Family history heart disease, stroke, DM. Smoking status reviewed. Medications reviewed.  Objective:   BP (!) 172/96   Pulse 98   Temp 98.1 F (36.7 C) (Oral)   Wt 104 lb (47.2 kg)   SpO2 98%   BMI 17.85 kg/m  Vitals and nursing note reviewed.  General: frail elderly woman, appears older than stated age, in no acute distress with non-toxic appearance HEENT: normocephalic, atraumatic, moist mucous membranes Neck: supple, non-tender without lymphadenopathy CV: regular rate and rhythm without murmurs, rubs, or gallops, no lower extremity edema Lungs:  clear to auscultation bilaterally with normal work of breathing Abdomen: soft, non-tender, non-distended, no masses or organomegaly palpable, normoactive bowel sounds Skin: warm, dry, no rashes or lesions, cap refill < 2 seconds Extremities: warm and well perfused, normal tone Psych: anxious, euthymic mood, congruent affect  Assessment & Plan:   Primary hypertension Chronic. Uncontrolled. Patient adherent to current regimen. Current every day smoker. Could also be exacerbated by underlying grief due to anniversary of older son's death. --Continue HCTZ 25 mg daily --Will obtain CMET, CBC with differential, TSH, LDL --Counseled patient on importance of cessation of smoking --Urged patient to start additional medication today, however patient resistant and agreeable to reconsideration in 2 weeks at next appointment if appropriate  Tobacco use disorder Chronic. Current every day smoker. Currently smoking 5 cigarettes per day. On nicotine replacement. No history of low contrast CT. --Continue nicotine replacement therapy --Ordered low contrast chest CT  Hot flashes Chronic. Daily issue for approximately 4 years. Patient endorsing menopause around late 62s with history of hysterectomy. Other associated symptoms include nausea. Unlikely from post menopause given latency. Will need further workup. --Will obtain, CBC with differential, CMET, TSH, blood smear --RTC in 2 weeks  Orders Placed This Encounter  Procedures  . CT CHEST LUNG CA SCREEN LOW DOSE W/O CM    Standing Status:   Future    Standing Expiration Date:   07/27/2018    Order Specific Question:   Reason for Exam (SYMPTOM  OR DIAGNOSIS REQUIRED)    Answer:   Smoker    Order Specific Question:   Preferred Imaging Location?    Answer:  [facility]    Order Specific Question:   Preferred imaging location?    Answer:   [facility]    Order Specific Question:   Radiology Contrast Protocol - do NOT remove file path     Answer:   \\[email or web address]  . CMP14+EGFR  . CBC with Differential/Platelet  . TSH  . LDL cholesterol, direct  . Pathologist smear review   No orders of the defined types were placed in this encounter.   Harriet Butte, [place], PGY-2 05/27/2017 4:46 PM

## 2017-05-27 NOTE — Assessment & Plan Note (Addendum)
Chronic. Uncontrolled. Patient adherent to current regimen. Current every day smoker. Could also be exacerbated by underlying grief due to anniversary of older son's death. --Continue HCTZ 25 mg daily --Will obtain CMET, CBC with differential, TSH, LDL --Counseled patient on importance of cessation of smoking --Urged patient to start additional medication today, however patient resistant and agreeable to reconsideration in 2 weeks at next appointment if appropriate

## 2017-05-27 NOTE — Assessment & Plan Note (Addendum)
Chronic. Daily issue for approximately 4 years. Patient endorsing menopause around late 79s with history of hysterectomy. Other associated symptoms include nausea. Unlikely from post menopause given latency. Will need further workup. --Will obtain, CBC with differential, CMET, TSH, blood smear --RTC in 2 weeks

## 2017-05-27 NOTE — Assessment & Plan Note (Deleted)
A 

## 2017-05-27 NOTE — Patient Instructions (Signed)
Thank you for coming in to see [place] today. Please see below to review our plan for today's visit.  1. Continue taking your HCTZ as prescribed. We will recheck your blood pressure during your next visit. Record your blood pressures at home a few times throughout the week. These remain elevated we may have to start a new medication. The best thing you can to stop smoking. I understand this is a difficult time for you. If we start you on any medication, there is a possibility we can stop it if her blood pressure improves. 2. I will have a return in 2 weeks so we can further workup can night sweats you're experiencing. 3. Please call and schedule your DEXA screening. 4. We will call schedule the low contrast CT for your lung cancer screening.  Please call the clinic at [phone] if your symptoms worsen or you have any concerns. It was my pleasure to see you. -- Harriet Butte, [place], PGY-2

## 2017-05-27 NOTE — Assessment & Plan Note (Addendum)
Chronic. Current every day smoker. Currently smoking 5 cigarettes per day. On nicotine replacement. No history of low contrast CT. --Continue nicotine replacement therapy --Ordered low contrast chest CT

## 2017-05-28 LAB — TSH: TSH: 1.12 u[IU]/mL (ref 0.450–4.500)

## 2017-05-28 LAB — CBC WITH DIFFERENTIAL/PLATELET
BASOS: 1 %
Basophils Absolute: 0.1 10*3/uL (ref 0.0–0.2)
EOS (ABSOLUTE): 0 10*3/uL (ref 0.0–0.4)
Eos: 1 %
HEMOGLOBIN: 14.3 g/dL (ref 11.1–15.9)
Hematocrit: 41.3 % (ref 34.0–46.6)
IMMATURE GRANS (ABS): 0 10*3/uL (ref 0.0–0.1)
Immature Granulocytes: 0 %
LYMPHS: 32 %
Lymphocytes Absolute: 2.7 10*3/uL (ref 0.7–3.1)
MCH: 31.5 pg (ref 26.6–33.0)
MCHC: 34.6 g/dL (ref 31.5–35.7)
MCV: 91 fL (ref 79–97)
MONOCYTES: 12 %
Monocytes Absolute: 1 10*3/uL — ABNORMAL HIGH (ref 0.1–0.9)
NEUTROS ABS: 4.5 10*3/uL (ref 1.4–7.0)
Neutrophils: 54 %
Platelets: 245 10*3/uL (ref 150–379)
RBC: 4.54 x10E6/uL (ref 3.77–5.28)
RDW: 14.1 % (ref 12.3–15.4)
WBC: 8.3 10*3/uL (ref 3.4–10.8)

## 2017-05-28 LAB — CMP14+EGFR
ALT: 35 IU/L — AB (ref 0–32)
AST: 26 IU/L (ref 0–40)
Albumin/Globulin Ratio: 2.2 (ref 1.2–2.2)
Albumin: 4.9 g/dL — ABNORMAL HIGH (ref 3.5–4.8)
Alkaline Phosphatase: 82 IU/L (ref 39–117)
BUN/Creatinine Ratio: 13 (ref 12–28)
BUN: 7 mg/dL — AB (ref 8–27)
Bilirubin Total: 0.3 mg/dL (ref 0.0–1.2)
CALCIUM: 9.6 mg/dL (ref 8.7–10.3)
CO2: 27 mmol/L (ref 20–29)
CREATININE: 0.53 mg/dL — AB (ref 0.57–1.00)
Chloride: 90 mmol/L — ABNORMAL LOW (ref 96–106)
GFR, EST AFRICAN AMERICAN: 111 mL/min/{1.73_m2} (ref >59)
GFR, EST NON AFRICAN AMERICAN: 97 mL/min/{1.73_m2} (ref >59)
GLUCOSE: 101 mg/dL — AB (ref 65–99)
Globulin, Total: 2.2 g/dL (ref 1.5–4.5)
Potassium: 3.8 mmol/L (ref 3.5–5.2)
Sodium: 136 mmol/L (ref 134–144)
TOTAL PROTEIN: 7.1 g/dL (ref 6.0–8.5)

## 2017-05-28 LAB — LDL CHOLESTEROL, DIRECT: LDL Direct: 55 mg/dL (ref 0–99)

## 2017-05-29 ENCOUNTER — Encounter: Payer: Self-pay | Admitting: Family Medicine

## 2017-05-29 LAB — PATHOLOGIST SMEAR REVIEW
BASOS ABS: 0 10*3/uL (ref 0.0–0.2)
Basos: 1 %
EOS (ABSOLUTE): 0.1 10*3/uL (ref 0.0–0.4)
EOS: 1 %
HEMOGLOBIN: 14.1 g/dL (ref 11.1–15.9)
Hematocrit: 41.1 % (ref 34.0–46.6)
Immature Grans (Abs): 0 10*3/uL (ref 0.0–0.1)
Immature Granulocytes: 0 %
LYMPHS ABS: 2.9 10*3/uL (ref 0.7–3.1)
Lymphs: 33 %
MCH: 31.5 pg (ref 26.6–33.0)
MCHC: 34.3 g/dL (ref 31.5–35.7)
MCV: 92 fL (ref 79–97)
Monocytes Absolute: 1.1 10*3/uL — ABNORMAL HIGH (ref 0.1–0.9)
Monocytes: 12 %
NEUTROS ABS: 4.6 10*3/uL (ref 1.4–7.0)
Neutrophils: 53 %
PLATELETS: 239 10*3/uL (ref 150–379)
RBC: 4.48 x10E6/uL (ref 3.77–5.28)
RDW: 13.8 % (ref 12.3–15.4)
WBC: 8.6 10*3/uL (ref 3.4–10.8)

## 2017-06-11 ENCOUNTER — Encounter: Payer: Self-pay | Admitting: Family Medicine

## 2017-06-11 NOTE — Progress Notes (Signed)
Subjective:   Patient ID: Pamela Russell    DOB: [birth year], 71 y.o. female   MRN: [id number]  CC: "High blood pressure"  HPI: Pamela Russell is a 71 y.o. female who presents to clinic today for the following:  Hypertension: Patient states she has been compliant with her current medication regimen.  She checks her blood pressure about 3 times daily with systolics 774-128 and diastolics 78-67.  She feels her blood pressure is more elevated today due to her anticipation concerning the results of her CT.  Smoking: Patient continues to smoke 5-7 cigarettes daily.  She is using nicotine patches when "she thinks she needs one and does not want to smoke around a particular individual."  She is not interested in Chantix at this time due to side effects.  Night sweats: Continues to have night sweats nightly.  Also endorsing occasional episodes during the day the most recent prior to her CT exam.  She states her night sweats are worse at night requiring her to change clothes.  She states this is been an ongoing issue for the past 4 years.  She has never had workup for her night sweats. ROS: Denies fevers, nausea or vomiting, chest pain, shortness of breath, cough, abdominal pain, diarrhea or constipation, joint or muscle pain, rash.  Complete ROS performed, see HPI for pertinent.  [place]: HTN, COPD, tobacco abuse. Surgical history TAHBSO, cataract b/l. Family history heart disease, stroke, DM. Smoking status reviewed. Medications reviewed.  Objective:   BP 140/70   Pulse (!) 104   Temp 98 F (36.7 C) (Oral)   Wt 105 lb (47.6 kg)   SpO2 97%   BMI 18.02 kg/m  Vitals and nursing note reviewed.  General: thin elderly woman appearing older than stated age, in no acute distress with non-toxic appearance HEENT: normocephalic, atraumatic, moist mucous membranes Neck: supple, non-tender without lymphadenopathy CV: regular rate and rhythm without murmurs, rubs, or gallops, no lower extremity  edema Lungs: clear to auscultation bilaterally with normal work of breathing Abdomen: soft, non-tender, non-distended, no masses or organomegaly palpable, normoactive bowel sounds Skin: warm, dry, no rashes or lesions, cap refill < 2 seconds Extremities: warm and well perfused, normal tone  Assessment & Plan:   Night sweats Chronic.  Uncertain etiology.  Given long-term tobacco use and CT findings, would need to rule out pulmonary malignancy.  No red flags.  Well-appearing.  Has a persistent history of tachycardia and hypertension.  CBC, peripheral smear, TSH within normal limits.  -Ambulatory referral to pulmonology for evaluations of pulmonary nodules -Will obtain HIV, RPR, 24-hour metanephrine, catecholamine, 5-HIAA level  Primary hypertension Chronic.  Suboptimal control.  Currently on HCTZ, max dose.  Continues to smoke. -Discussed smoking cessation -Continue HCTZ 25 mg daily, initiating amlodipine 5 mg daily -RTC 2 weeks  Tobacco use disorder Chronic.  Continues to smoke.  Motivated yet apprehensive to decrease at this time.  Currently on nicotine replacement on a as needed basis per patient. -Discussed importance of smoking cessation given uncontrolled hypertension and pulmonary nodules -Goal 3-4 cigarettes/week at next visit in 2 weeks, could consider Wellbutrin  Orders Placed This Encounter  Procedures  . Catecholamines, fractionated, urine, 24 hour    Standing Status:   Future    Standing Expiration Date:   06/12/2018  . Metanephrines, urine, 24 hour    Standing Status:   Future    Standing Expiration Date:   06/12/2018  . 5 HIAA w/Creatinine, 24 hr.    Standing Status:  Future    Standing Expiration Date:   06/12/2018  . HIV antibody (with reflex)  . RPR  . Ambulatory referral to Pulmonology    Referral Priority:   Routine    Referral Type:   Consultation    Referral Reason:   Specialty Services Required    Requested Specialty:   Pulmonary Disease    Number of  Visits Requested:   1   Meds ordered this encounter  Medications  . DISCONTD: amLODipine (NORVASC) 5 MG tablet    Sig: Take 1 tablet (5 mg total) by mouth daily.    Dispense:  90 tablet    Refill:  3  . amLODipine (NORVASC) 5 MG tablet    Sig: Take 1 tablet (5 mg total) by mouth daily.    Dispense:  90 tablet    Refill:  [place], [place], PGY-2 06/12/2017 8:42 PM

## 2017-06-12 ENCOUNTER — Ambulatory Visit
Admission: RE | Admit: 2017-06-12 | Discharge: 2017-06-12 | Disposition: A | Discharge status: Home / Self Care | Payer: Medicare Other | Source: Ambulatory Visit | Attending: Family Medicine | Admitting: Family Medicine

## 2017-06-12 ENCOUNTER — Ambulatory Visit (INDEPENDENT_AMBULATORY_CARE_PROVIDER_SITE_OTHER): Payer: Medicare Other | Admitting: Family Medicine

## 2017-06-12 ENCOUNTER — Encounter: Payer: Self-pay | Admitting: Family Medicine

## 2017-06-12 VITALS — BP 140/70 | HR 104 | Temp 98.0°F | Wt 105.0 lb

## 2017-06-12 DIAGNOSIS — I10 Essential (primary) hypertension: Secondary | ICD-10-CM | POA: Diagnosis not present

## 2017-06-12 DIAGNOSIS — Z114 Encounter for screening for human immunodeficiency virus [HIV]: Secondary | ICD-10-CM | POA: Diagnosis not present

## 2017-06-12 DIAGNOSIS — R918 Other nonspecific abnormal finding of lung field: Secondary | ICD-10-CM | POA: Diagnosis not present

## 2017-06-12 DIAGNOSIS — F17200 Nicotine dependence, unspecified, uncomplicated: Secondary | ICD-10-CM

## 2017-06-12 DIAGNOSIS — Z122 Encounter for screening for malignant neoplasm of respiratory organs: Secondary | ICD-10-CM

## 2017-06-12 DIAGNOSIS — R61 Generalized hyperhidrosis: Secondary | ICD-10-CM | POA: Diagnosis not present

## 2017-06-12 HISTORY — DX: Other nonspecific abnormal finding of lung field: R91.8

## 2017-06-12 MED ORDER — AMLODIPINE BESYLATE 5 MG PO TABS: 5.0000 mg | ORAL_TABLET | Freq: Every day | ORAL | 3 refills | Status: DC

## 2017-06-12 NOTE — Assessment & Plan Note (Addendum)
Chronic.  Uncertain etiology.  Given long-term tobacco use and CT findings, would need to rule out pulmonary malignancy.  No red flags.  Well-appearing.  Has a persistent history of tachycardia and hypertension.  CBC, peripheral smear, TSH within normal limits.  -Ambulatory referral to pulmonology for evaluations of pulmonary nodules -Will obtain HIV, RPR, 24-hour metanephrine, catecholamine, 5-HIAA level

## 2017-06-12 NOTE — Assessment & Plan Note (Addendum)
Chronic.  Continues to smoke.  Motivated yet apprehensive to decrease at this time.  Currently on nicotine replacement on a as needed basis per patient. -Discussed importance of smoking cessation given uncontrolled hypertension and pulmonary nodules -Goal 3-4 cigarettes/week at next visit in 2 weeks, could consider Wellbutrin

## 2017-06-12 NOTE — Assessment & Plan Note (Addendum)
Chronic.  Suboptimal control.  Currently on HCTZ, max dose.  Continues to smoke. -Discussed smoking cessation -Continue HCTZ 25 mg daily, initiating amlodipine 5 mg daily -RTC 2 weeks

## 2017-06-12 NOTE — Patient Instructions (Signed)
Thank you for coming in to see Pamela Russell today. Please see below to review our plan for today's visit.  1. I will call you if your lab work is abnormal. 2. You do have several small pulmonary nodules which need to be further evaluated by a pulmonologist. I have placed a referral and they will contact you concerning the appointment. 3. Please call and schedule a screening colonoscopy. 4. Return in 2 weeks. We will see how your blood pressure is doing with the new medication amlodipine.  Please call the clinic at [phone] if your symptoms worsen or you have any concerns. It was my pleasure to see you. -- Harriet Butte, [place], PGY-2

## 2017-06-13 LAB — RPR: RPR: NONREACTIVE

## 2017-06-13 LAB — HIV ANTIBODY (ROUTINE TESTING W REFLEX): HIV Screen 4th Generation wRfx: NONREACTIVE

## 2017-06-14 ENCOUNTER — Encounter: Payer: Self-pay | Admitting: Family Medicine

## 2017-07-02 NOTE — Addendum Note (Signed)
Addended by: [place] Pink on: 07/02/2017 12:04 PM   Modules accepted: Orders

## 2017-07-02 NOTE — Addendum Note (Signed)
Addended by: Francene Castle on: 07/02/2017 12:28 PM   Modules accepted: Orders

## 2017-07-02 NOTE — Addendum Note (Signed)
Addended by: Francene Castle on: 07/02/2017 11:44 AM   Modules accepted: Orders

## 2017-07-04 NOTE — Progress Notes (Signed)
   Subjective   Patient ID: Pamela Russell    DOB: Apr 21, 1946, 71 y.o. female   MRN: [id number]  CC: "Blood pressure check"  HPI: LORIENE Russell is a 71 y.o. female who presents to clinic today for the following:  Hypertension: Patient unable to take newly prescribed amlodipine due to her insurance company.  She has continued her HCTZ daily.  She continues to smoke daily.  Itchy ears: Chronic issue well-controlled with mometasone using Q-tips.  She states her ears are very itchy when she misses a day and has been evaluated by ENT before.  She denies any hearing loss, headaches, fevers or chills, ear discharge.  Smoking: Patient continues to smoke as well as 2 cigarettes/day but occasionally up to 5 cigarettes/day.  She states she is currently working on cutting down but is not yet ready to completely stop.  She denies any coughing, chest pain, shortness of breath, wheezing.  ROS: see HPI for pertinent.  [place]: HTN, COPD, tobacco abuse. Surgical history TAHBSO, cataract b/l. Family history heart disease, stroke, DM. Smoking status reviewed. Medications reviewed.  Objective   BP (!) 162/78   Pulse 92   Temp 97.9 F (36.6 C) (Oral)   Ht 5\' 4"  (1.626 m)   Wt 106 lb 6.4 oz (48.3 kg)   SpO2 96%   BMI 18.26 kg/m  Vitals and nursing note reviewed.  General: thin elderly woman, NAD with non-toxic appearance HEENT: normocephalic, atraumatic, moist mucous membranes, gray TMs bilaterally without cerumen impaction or discharge, no tenderness at tragus bilaterally Neck: supple, non-tender without lymphadenopathy Cardiovascular: regular rate and rhythm without murmurs, rubs, or gallops Lungs: clear to auscultation bilaterally with normal work of breathing Skin: warm, dry, no rashes or lesions, cap refill < 2 seconds Extremities: warm and well perfused, normal tone, no edema  Assessment & Plan   Ear itching Chronic. Uncertain source of pruritis though no red flags or clinical signs to  suggest otitis externa, cerumen impaction, secondary infection, or dermatitis. Symptoms well controlled with mometasone. - Given refill mometasone 0.1% lotion  Primary hypertension Chronic. Uncontrolled. Not yet started amlodipine due to difficulties with delivery pharmacy. Continues to smoke. - Given print prescription amlodipine 5 mg daily, continue HCTZ 25 mg daily - RTC in 3 weeks to recheck HTN control  Tobacco use disorder Chronic. Motivated to reduce use of cigs but not ready for complete cessation.  - Encouraged to reduced tobacco use  No orders of the defined types were placed in this encounter.  Meds ordered this encounter  Medications  . mometasone (ELOCON) 0.1 % lotion    Sig: Apply daily topically.    Dispense:  30 mL    Refill:  0  . amLODipine (NORVASC) 5 MG tablet    Sig: Take 1 tablet (5 mg total) daily by mouth.    Dispense:  90 tablet    Refill:  [place], [place], PGY-2 07/07/2017, 8:27 PM

## 2017-07-05 ENCOUNTER — Ambulatory Visit: Payer: Medicare Other | Admitting: Family Medicine

## 2017-07-05 ENCOUNTER — Encounter: Payer: Self-pay | Admitting: Family Medicine

## 2017-07-05 VITALS — BP 162/78 | HR 92 | Temp 97.9°F | Ht 64.0 in | Wt 106.4 lb

## 2017-07-05 DIAGNOSIS — F17200 Nicotine dependence, unspecified, uncomplicated: Secondary | ICD-10-CM

## 2017-07-05 DIAGNOSIS — L299 Pruritus, unspecified: Secondary | ICD-10-CM

## 2017-07-05 DIAGNOSIS — I10 Essential (primary) hypertension: Secondary | ICD-10-CM | POA: Diagnosis not present

## 2017-07-05 HISTORY — DX: Pruritus, unspecified: L29.9

## 2017-07-05 LAB — 5 HIAA, QUANTITATIVE, URINE, 24 HOUR
5 HIAA UR: 0.9 mg/L
5-HIAA,QUANT.,24 HR URINE: 1.4 mg/(24.h) (ref 0.0–14.9)

## 2017-07-05 MED ORDER — AMLODIPINE BESYLATE 5 MG PO TABS: 5.0000 mg | ORAL_TABLET | Freq: Every day | ORAL | 3 refills | Status: DC

## 2017-07-05 MED ORDER — MOMETASONE FUROATE 0.1 % EX SOLN: Freq: Every day | CUTANEOUS | 0 refills | Status: DC

## 2017-07-05 NOTE — Assessment & Plan Note (Addendum)
Chronic. Motivated to reduce use of cigs but not ready for complete cessation.  - Encouraged to reduced tobacco use

## 2017-07-05 NOTE — Assessment & Plan Note (Addendum)
Chronic. Uncontrolled. Not yet started amlodipine due to difficulties with delivery pharmacy. Continues to smoke. - Given print prescription amlodipine 5 mg daily, continue HCTZ 25 mg daily - RTC in 3 weeks to recheck HTN control

## 2017-07-05 NOTE — Assessment & Plan Note (Addendum)
Chronic. Uncertain source of pruritis though no red flags or clinical signs to suggest otitis externa, cerumen impaction, secondary infection, or dermatitis. Symptoms well controlled with mometasone. - Given refill mometasone 0.1% lotion

## 2017-07-05 NOTE — Patient Instructions (Signed)
Thank you for coming in to see [place] today. Please see below to review our plan for today's visit.  1.  Please take your amlodipine 5 mg daily.  I would record blood pressures once daily and keep them on a log and bring them to your next visit. 2.  Please call [facility] at [phone] to schedule your appointment.  3.  Return to the clinic in 3 weeks to recheck your high blood pressure.  You are also due for your annual wellness visit.  We can schedule you for this sometime at the beginning of next year.  Please call the clinic at [phone] if your symptoms worsen or you have any concerns. It was our pleasure to serve you.  Harriet Butte, [place], PGY-2

## 2017-07-10 LAB — CATECHOLAMINES, FRACTIONATED, URINE, 24 HOUR
DOPAMINE 24H UR: 78 ug/(24.h) (ref 0–510)
Dopamine, Rand Ur: 52 ug/L
Epinephrine, 24H Ur: 5 ug/24 hr (ref 0–20)
Epinephrine, Rand Ur: 3 ug/L
NOREPINEPH RAND UR: 17 ug/L
Norepinephrine, 24H Ur: 26 ug/24 hr (ref 0–135)

## 2017-07-10 LAB — METANEPHRINES, URINE, 24 HOUR
METANEPH TOTAL UR: 69 ug/L
METANEPHRINES 24H UR: 104 ug/(24.h) (ref 45–290)
NORMETANEPHRINE UR: 154 ug/L
Normetanephrine, 24H Ur: 231 ug/24 hr (ref 82–500)

## 2017-07-29 ENCOUNTER — Ambulatory Visit: Payer: Medicare Other | Admitting: Family Medicine

## 2017-07-31 ENCOUNTER — Ambulatory Visit: Payer: Medicare Other | Admitting: Family Medicine

## 2017-08-21 ENCOUNTER — Other Ambulatory Visit: Payer: Self-pay | Admitting: Family Medicine

## 2017-08-23 ENCOUNTER — Institutional Professional Consult (permissible substitution): Payer: Medicare Other | Admitting: Pulmonary Disease

## 2017-08-29 ENCOUNTER — Other Ambulatory Visit: Payer: Self-pay

## 2017-08-29 ENCOUNTER — Encounter: Payer: Self-pay | Admitting: Family Medicine

## 2017-08-29 ENCOUNTER — Institutional Professional Consult (permissible substitution): Payer: Medicare Other | Admitting: Pulmonary Disease

## 2017-08-29 ENCOUNTER — Ambulatory Visit: Payer: Medicare Other | Admitting: Family Medicine

## 2017-08-29 VITALS — BP 142/68 | HR 97 | Temp 97.2°F | Wt 101.4 lb

## 2017-08-29 DIAGNOSIS — F17200 Nicotine dependence, unspecified, uncomplicated: Secondary | ICD-10-CM

## 2017-08-29 DIAGNOSIS — J441 Chronic obstructive pulmonary disease with (acute) exacerbation: Secondary | ICD-10-CM | POA: Insufficient documentation

## 2017-08-29 DIAGNOSIS — I10 Essential (primary) hypertension: Secondary | ICD-10-CM | POA: Diagnosis not present

## 2017-08-29 MED ORDER — AZITHROMYCIN 250 MG PO TABS: ORAL_TABLET | ORAL | 0 refills | Status: DC

## 2017-08-29 MED ORDER — PREDNISONE 20 MG PO TABS: 20.0000 mg | ORAL_TABLET | Freq: Every day | ORAL | 0 refills | Status: DC

## 2017-08-29 NOTE — Assessment & Plan Note (Addendum)
Chronic.  Suboptimal control.  Likely due to COPD exacerbation.  Continues to smoke. - Continue HCTZ 25 mg daily and amlodipine 5 mg daily

## 2017-08-29 NOTE — Assessment & Plan Note (Addendum)
Chronic.  Current everyday smoker.  Smokes 5-6 cigarettes/day.  Not interested in cessation today. - Provided smoking cessation counseling importance of discontinuation to prevent COPD exacerbations and other complications

## 2017-08-29 NOTE — Assessment & Plan Note (Addendum)
Acute.  Currently smoking.  Wheezes throughout without focality.  Likely secondary to viral infection given sick contact.  Does not appear to be in respiratory distress and mildly hypoxic as low as 90% with ambulation. - Will treat for COPD exacerbation with 5 days of prednisone 20 mg daily and azithromycin - Reviewed return precautions and red flags - Advised smoking cessation

## 2017-08-29 NOTE — Progress Notes (Signed)
   Subjective   Patient ID: Pamela Russell    DOB: Dec 30, 1945, 71 y.o. female   MRN: [id number]  CC: "Sinus congestion"  HPI: Pamela Russell is a 72 y.o. female who presents to clinic today for the following:  Sinus congestion: Patient states she has been experiencing sinus congestion, nausea, productive cough with white phlegm, and occasional epistaxis for the past 5 days.  She was recently around her daughter who has covered from a viral illness.  Patient has increased her use of albuterol to 3-5 times per day.  She feels like her symptoms are worse at night.  She denies any chest pain or shortness of breath.  Her congestion improved with use of an over-the-counter nasal flush which she used sterile water with.  She continues to smoke.  Smoking: Patient continues to smoke 5-6 cigarettes/day.  She is not yet ready for smoking cessation.  Hypertension: Patient recently started amlodipine since last visit.  She has been compliant with her medications.  She states she took her medication earlier this morning.  ROS: see HPI for pertinent.  [place]: HTN, COPD, tobacco abuse. Surgical history TAHBSO, cataract b/l. Family history heart disease, stroke, DM. Smoking status reviewed. Medications reviewed.  Objective   BP (!) 142/68   Pulse 97   Temp (!) 97.2 F (36.2 C) (Oral)   Wt 101 lb 6.4 oz (46 kg)   SpO2 90%   BMI 17.41 kg/m  Vitals and nursing note reviewed.  General: emaciated elderly woman, NAD with non-toxic appearance HEENT: normocephalic, atraumatic, moist mucous membranes, nasal congestion Neck: supple, non-tender without lymphadenopathy Cardiovascular: regular rate and rhythm without murmurs, rubs, or gallops Lungs: wheeze bilaterally without rales or rhonchi, minimal increased work of breathing getting onto exam table, cough present Skin: warm, dry, no rashes or lesions, cap refill < 2 seconds Extremities: warm and well perfused, normal tone, no edema  Assessment & Plan     COPD exacerbation (HCC) Acute.  Currently smoking.  Wheezes throughout without focality.  Likely secondary to viral infection given sick contact.  Does not appear to be in respiratory distress and mildly hypoxic as low as 90% with ambulation. - Will treat for COPD exacerbation with 5 days of prednisone 20 mg daily and azithromycin - Reviewed return precautions and red flags - Advised smoking cessation  Primary hypertension Chronic.  Suboptimal control.  Likely due to COPD exacerbation.  Continues to smoke. - Continue HCTZ 25 mg daily and amlodipine 5 mg daily  Tobacco use disorder Chronic.  Current everyday smoker.  Smokes 5-6 cigarettes/day.  Not interested in cessation today. - Provided smoking cessation counseling importance of discontinuation to prevent COPD exacerbations and other complications  No orders of the defined types were placed in this encounter.  Meds ordered this encounter  Medications  . predniSONE (DELTASONE) 20 MG tablet    Sig: Take 1 tablet (20 mg total) by mouth daily with breakfast for 5 days.    Dispense:  5 tablet    Refill:  0  . azithromycin (ZITHROMAX) 250 MG tablet    Sig: Take 2 tabs by mouth on day 1, then 1 tab by mouth daily    Dispense:  6 each    Refill:  0    Harriet Butte, DO [place], PGY-2 08/30/2017, 6:46 PM

## 2017-08-29 NOTE — Patient Instructions (Signed)
Thank you for coming in to see [place] today. Please see below to review our plan for today's visit.  1.  Your symptoms are consistent with a COPD exacerbation.  Take the azithromycin antibiotic and the steroid as instructed.  You should be completed after 5 days.  If you develop worsening shortness of breath, chest pain, seek immediate medical attention and call 911.  I would like you to be seen in 2 days to make sure you are symptoms are improving. 2.  It is important that you stop smoking.  You can greatly improve your lung function if you stop today.  If this will help prevent you from getting infections such as the one you are coming in today with. 3.  Her blood pressure is elevated but this is likely due to your illness.  We will follow-up and manage appropriately.  Please call the clinic at [phone] if your symptoms worsen or you have any concerns. It was our pleasure to serve you.  Harriet Butte, [place], PGY-2

## 2017-09-03 ENCOUNTER — Ambulatory Visit: Payer: Medicare Other | Admitting: Family Medicine

## 2017-09-03 ENCOUNTER — Encounter: Payer: Self-pay | Admitting: Family Medicine

## 2017-09-03 ENCOUNTER — Other Ambulatory Visit: Payer: Self-pay

## 2017-09-03 DIAGNOSIS — F17200 Nicotine dependence, unspecified, uncomplicated: Secondary | ICD-10-CM | POA: Diagnosis not present

## 2017-09-03 DIAGNOSIS — J441 Chronic obstructive pulmonary disease with (acute) exacerbation: Secondary | ICD-10-CM | POA: Diagnosis not present

## 2017-09-03 NOTE — Patient Instructions (Signed)
Thank you for coming in to see [place] today. Please see below to review our plan for today's visit.  You had a COPD exacerbation likely due to a viral illness.  You have completed steroids and antibiotics and should improve over the next week.  I would like to see you in 3 months.  Please call the clinic at [phone] if your symptoms worsen or you have any concerns. It was our pleasure to serve you.  Harriet Butte, [place], PGY-2

## 2017-09-03 NOTE — Assessment & Plan Note (Addendum)
Acute. Resolved. Likely d/t viral URI. Current every-day smoker. Was treated with 5-day prednisone burst and Z-pack with significant improvement of symptoms. Cough persists but no-longer productive. Not hypoxic and without wheeze or increased respiratory effort. Flu and PNA vaccine UTD. - Advised against smoking given advanced COPD - Reviewed return precautions and red flags

## 2017-09-03 NOTE — Assessment & Plan Note (Signed)
Chronic. Has stopped smoking since illness 1 week onset. Interested in cessation. - Discussed importance of cessation - RTC 3 months or sooner if needed

## 2017-09-03 NOTE — Progress Notes (Signed)
   Subjective   Patient ID: [name]    DOB: 10-28-1945, 72 y.o. female   MRN: [id number]  CC: "COPD exacerbation follow-up"  HPI: Pamela Russell is a 72 y.o. female who presents to clinic today for the following:  COPD exacerbation: Patient was seen by me 5 days ago for shortness of breath and sinus congestion.  She was endorsing viral URI symptoms including increased white phlegm cough production.  She was found to be hypoxic as low as 90%.  Patient was given azithromycin and prednisone for 5-day burst with completion of course.  She feels much better since completing her medication but continues to have some sinus congestion.  She also has a nonproductive cough.  She has not smoked since the onset of her illness.  She intends to stop smoking at present.  ROS: see HPI for pertinent.  [place]: HTN, COPD, tobacco abuse. Surgical history TAHBSO, cataract b/l. Family history heart disease, stroke, DM. Smoking status reviewed. Medications reviewed.  Objective   BP 122/70   Pulse 88   Temp 98.1 F (36.7 C) (Oral)   Ht 5\' 4"  (1.626 m)   Wt 104 lb (47.2 kg)   SpO2 93%   BMI 17.85 kg/m  Vitals and nursing note reviewed.  General: thin elderly woman, NAD with non-toxic appearance HEENT: normocephalic, atraumatic, moist mucous membranes Neck: supple, non-tender without lymphadenopathy Cardiovascular: regular rate and rhythm without murmurs, rubs, or gallops Lungs: clear to auscultation bilaterally with normal work of breathing, nonproductive cough present Skin: warm, dry, no rashes or lesions, cap refill < 2 seconds Extremities: warm and well perfused, normal tone, no edema  Assessment & Plan   COPD exacerbation (HCC) Acute. Resolved. Likely d/t viral URI. Current every-day smoker. Was treated with 5-day prednisone burst and Z-pack with significant improvement of symptoms. Cough persists but no-longer productive. Not hypoxic and without wheeze or increased respiratory effort. Flu  and PNA vaccine UTD. - Advised against smoking given advanced COPD - Reviewed return precautions and red flags  Tobacco use disorder Chronic. Has stopped smoking since illness 1 week onset. Interested in cessation. - Discussed importance of cessation - RTC 3 months or sooner if needed  No orders of the defined types were placed in this encounter.  No orders of the defined types were placed in this encounter.   Harriet Butte, [place], PGY-2 09/03/2017, 8:02 PM

## 2017-09-09 ENCOUNTER — Encounter: Payer: Self-pay | Admitting: Family Medicine

## 2017-09-19 ENCOUNTER — Institutional Professional Consult (permissible substitution): Payer: Medicare Other | Admitting: Pulmonary Disease

## 2017-10-07 ENCOUNTER — Other Ambulatory Visit: Payer: Self-pay

## 2017-10-07 DIAGNOSIS — I10 Essential (primary) hypertension: Secondary | ICD-10-CM

## 2017-10-07 DIAGNOSIS — E782 Mixed hyperlipidemia: Secondary | ICD-10-CM

## 2017-10-07 MED ORDER — AMLODIPINE BESYLATE 5 MG PO TABS: 5.0000 mg | ORAL_TABLET | Freq: Every day | ORAL | 3 refills | Status: DC

## 2017-10-07 MED ORDER — HYDROCHLOROTHIAZIDE 25 MG PO TABS: 25.0000 mg | ORAL_TABLET | Freq: Every day | ORAL | 3 refills | Status: DC

## 2017-10-07 MED ORDER — ATORVASTATIN CALCIUM 40 MG PO TABS: 40.0000 mg | ORAL_TABLET | Freq: Every day | ORAL | 3 refills | Status: DC

## 2017-10-07 NOTE — Telephone Encounter (Signed)
Patient called for refills of HCTZ ([facility] states did not receive), Amlodipine (sending to [facility] for first time) and Atorvastatin. Danley Danker, RN [facility] RN)

## 2017-10-15 ENCOUNTER — Encounter: Payer: Self-pay | Admitting: Pulmonary Disease

## 2017-10-15 ENCOUNTER — Institutional Professional Consult (permissible substitution): Payer: Medicare Other | Admitting: Pulmonary Disease

## 2017-10-15 ENCOUNTER — Ambulatory Visit: Payer: Medicare Other | Admitting: Pulmonary Disease

## 2017-10-15 VITALS — BP 140/82 | HR 99 | Ht 64.0 in | Wt 105.8 lb

## 2017-10-15 DIAGNOSIS — R918 Other nonspecific abnormal finding of lung field: Secondary | ICD-10-CM

## 2017-10-15 DIAGNOSIS — F17210 Nicotine dependence, cigarettes, uncomplicated: Secondary | ICD-10-CM

## 2017-10-15 DIAGNOSIS — J449 Chronic obstructive pulmonary disease, unspecified: Secondary | ICD-10-CM | POA: Diagnosis not present

## 2017-10-15 DIAGNOSIS — J45909 Unspecified asthma, uncomplicated: Secondary | ICD-10-CM | POA: Diagnosis not present

## 2017-10-15 LAB — NITRIC OXIDE: NITRIC OXIDE: 5

## 2017-10-15 NOTE — Progress Notes (Signed)
Pamela Russell    [id number]    03-24-1946  Primary Care Physician:McMullen, Grayling Congress, DO  Referring Physician: [place] Bing, DO [place], [place] [address]  Chief complaint: Abnormal CT scan  HPI: 72 year old with history of COPD, asthma, hypertension, active smoker She had a low-dose screening CT in November 2018 which showed right upper lobe nodules, groundglass opacities and is here for follow-up. She reports chronic dyspnea on exertion, denies any cough, mucus production, hemoptysis, fevers, chills. Maintain on Spiriva and albuterol PRN for many years.  Pets: None Occupation: Used to work for [employer].  Retired in the 1990s. Exposures: No known exposures, no mold, hot tub Smoking history: 60 pack year smoking hisotry. Continues to smoke 1/2 ppd Travel History: Born and raised in [place].  No significant travel  Outpatient Encounter Medications as of 10/15/2017  Medication Sig  . albuterol (PROVENTIL HFA;VENTOLIN HFA) 108 (90 BASE) MCG/ACT inhaler Inhale 2 puffs into the lungs every 6 (six) hours as needed for wheezing.  Marland Kitchen amLODipine (NORVASC) 5 MG tablet Take 1 tablet (5 mg total) by mouth daily.  Marland Kitchen aspirin 81 MG tablet Take 81 mg by mouth daily.  Marland Kitchen atorvastatin (LIPITOR) 40 MG tablet Take 1 tablet (40 mg total) by mouth daily.  . Black Cohosh-Soy-Ginkgo-Magnol (ESTROVEN MOOD & MEMORY PO) Take 1 tablet by mouth.  Marland Kitchen glucosamine-chondroitin 500-400 MG tablet Take 1 tablet by mouth once.  Marland Kitchen guaifenesin (HUMIBID E) 400 MG TABS tablet Take 400 mg by mouth once.  . hydrochlorothiazide (HYDRODIURIL) 25 MG tablet Take 1 tablet (25 mg total) by mouth daily.  . mometasone (ELOCON) 0.1 % lotion Apply daily topically.  . Multiple Vitamin (MULTIVITAMIN) tablet Take 1 tablet by mouth daily.  . nicotine (NICODERM CQ - DOSED IN MG/24 HOURS) 21 mg/24hr patch Place 1 patch (21 mg total) onto the skin daily. May substitute formulary preferred brand.   . tiotropium (SPIRIVA) 18 MCG inhalation capsule Place 18 mcg into inhaler and inhale daily.   No facility-administered encounter medications on file as of 10/15/2017.     Allergies as of 10/15/2017 - Review Complete 10/15/2017  Allergen Reaction Noted  . Codeine Anaphylaxis and Swelling 04/27/2013  . Penicillins Anaphylaxis and Swelling 04/27/2013    Past Medical History:  Diagnosis Date  . Allergic rhinitis   . Asthma 2008  . Hypertension     Past Surgical History:  Procedure Laterality Date  . CATARACT EXTRACTION Left 05/08/2016  . CATARACT EXTRACTION Right 05/22/2016  . TOTAL ABDOMINAL HYSTERECTOMY W/ BILATERAL SALPINGOOPHORECTOMY Bilateral 1975    Family History  Problem Relation Age of Onset  . Heart disease Mother   . Heart disease Father   . Hypertension Father   . Heart disease Sister   . Stroke Sister   . Diabetes Sister     Social History   Socioeconomic History  . Marital status: Widowed    Spouse name: Not on file  . Number of children: Not on file  . Years of education: Not on file  . Highest education level: Not on file  Social Needs  . Financial resource strain: Not on file  . Food insecurity - worry: Not on file  . Food insecurity - inability: Not on file  . Transportation needs - medical: Not on file  . Transportation needs - non-medical: Not on file  Occupational History  . Not on file  Tobacco Use  . Smoking status: Current Every  Day Smoker    Packs/day: 0.50    Years: 60.00    Pack years: 30.00    Types: Cigarettes    Start date: 08/21/1955  . Smokeless tobacco: Never Used  . Tobacco comment: 3 ppd for 20+ years, has cut down to 0.5 ppd in the past 3 months  Substance and Sexual Activity  . Alcohol use: Yes    Alcohol/week: 0.6 oz    Types: 1 Cans of beer per week    Comment: occ  . Drug use: No  . Sexual activity: No  Other Topics Concern  . Not on file  Social History Narrative  . Not on file    Review of systems: Review  of Systems  Constitutional: Negative for fever and chills.  HENT: Negative.   Eyes: Negative for blurred vision.  Respiratory: as per HPI  Cardiovascular: Negative for chest pain and palpitations.  Gastrointestinal: Negative for vomiting, diarrhea, blood per rectum. Genitourinary: Negative for dysuria, urgency, frequency and hematuria.  Musculoskeletal: Negative for myalgias, back pain and joint pain.  Skin: Negative for itching and rash.  Neurological: Negative for dizziness, tremors, focal weakness, seizures and loss of consciousness.  Endo/Heme/Allergies: Negative for environmental allergies.  Psychiatric/Behavioral: Negative for depression, suicidal ideas and hallucinations.  All other systems reviewed and are negative.  Physical Exam: Blood pressure 140/82, pulse 99, height 5\' 4"  (1.626 m), weight 105 lb 12.8 oz (48 kg), SpO2 99 %. Gen:      No acute distress HEENT:  EOMI, sclera anicteric Neck:     No masses; no thyromegaly Lungs:    Clear to auscultation bilaterally; normal respiratory effort CV:         Regular rate and rhythm; no murmurs Abd:      + bowel sounds; soft, non-tender; no palpable masses, no distension Ext:    No edema; adequate peripheral perfusion Skin:      Warm and dry; no rash Neuro: alert and oriented x 3 Psych: normal mood and affect  Data Reviewed: FENO 10/15/17 < 5  CT scan 06/12/17-biapical scarring, mild emphysematous changes, right upper lobe scattered subcentimeter pulmonary nodules and groundglass opacities, mucoid impaction I have reviewed the images personally  Spirometry 11/29/16 FVC 2.72 [96%], FEV1 1.10 (49%], F/F 40 Severe obstruction  Assessment:  Abnormal CT scan Reviewed scan which shows right upper lobe nodularity and groundglass which could be from chronic bronchitis, inflammation.  She will need a follow-up CT scan in 6 months for surveillance  Severe COPD On Spiriva, albuterol  Active smoker Emphasized that she needs to quit  smoking.  Use nicotine patches Time spent counseling-5 minutes  Plan/Recommendations: - Follow-up CT in April 2019 - Continue Spiriva, albuterol - Nicotine patches, smoking cessation.  Marshell Garfinkel MD [facility] Pager [phone] 10/15/2017, 9:56 AM  CC: [place] Bing, DO

## 2017-10-15 NOTE — Patient Instructions (Signed)
We will order a follow-up CT of the chest to make sure the spots in the lung have not changed The scan will be ordered for end of April Continue Spiriva and albuterol Work on smoking cessation.  Will order nicotine patches to help with this Follow-up in clinic after scan for review.

## 2017-10-30 ENCOUNTER — Telehealth: Payer: Self-pay | Admitting: Pulmonary Disease

## 2017-10-30 NOTE — Telephone Encounter (Signed)
Spoke with patient. She stated that she was returning a call from someone from our office. She said it was regarding a CT scan in April. Looked in patient's chart, advised her that she has a CT scan scheduled for 4/26 at 1030am.   She verbalized understanding about appt. Nothing else needed at time of call.

## 2017-11-06 ENCOUNTER — Other Ambulatory Visit: Payer: Self-pay | Admitting: *Deleted

## 2017-12-13 ENCOUNTER — Ambulatory Visit (INDEPENDENT_AMBULATORY_CARE_PROVIDER_SITE_OTHER)
Admission: RE | Admit: 2017-12-13 | Discharge: 2017-12-13 | Disposition: A | Discharge status: Home / Self Care | Payer: Medicare Other | Source: Ambulatory Visit | Attending: Pulmonary Disease | Admitting: Pulmonary Disease

## 2017-12-13 DIAGNOSIS — R918 Other nonspecific abnormal finding of lung field: Secondary | ICD-10-CM | POA: Diagnosis not present

## 2017-12-18 ENCOUNTER — Ambulatory Visit: Payer: Medicare Other | Admitting: Pulmonary Disease

## 2017-12-18 ENCOUNTER — Encounter: Payer: Self-pay | Admitting: Pulmonary Disease

## 2017-12-18 VITALS — BP 130/68 | HR 106 | Ht 64.0 in | Wt 103.0 lb

## 2017-12-18 DIAGNOSIS — I2510 Atherosclerotic heart disease of native coronary artery without angina pectoris: Secondary | ICD-10-CM

## 2017-12-18 DIAGNOSIS — R911 Solitary pulmonary nodule: Secondary | ICD-10-CM | POA: Diagnosis not present

## 2017-12-18 DIAGNOSIS — J449 Chronic obstructive pulmonary disease, unspecified: Secondary | ICD-10-CM | POA: Diagnosis not present

## 2017-12-18 MED ORDER — NICOTINE 21-14-7 MG/24HR TD KIT: PACK | TRANSDERMAL | 0 refills | Status: DC

## 2017-12-18 NOTE — Patient Instructions (Signed)
I have reviewed your CT scan which shows that the lung nodule on the left side is slightly bigger compared to prior Worried that this may be a malignancy I will review your CT with a colleague to see if a biopsy is possible In the meantime will order a follow-up CT in 3 months We will refer you to cardiology for evaluation of coronary artery disease and schedule you for pulmonary function test  Please continue to work on smoking cessation.  Will prescribe nicotine patches Follow-up in 3 months.

## 2017-12-18 NOTE — Progress Notes (Signed)
Pamela Russell    [id number]    August 06, 1946  Primary Care Physician:McMullen, Grayling Congress, DO  Referring Physician: [place] Bing, DO [place], [place] [address]  Chief complaint: Abnormal CT scan  HPI: 72 year old with history of COPD, asthma, hypertension, active smoker She had a low-dose screening CT in November 2018 which showed right upper lobe nodules, groundglass opacities and is here for follow-up. She reports chronic dyspnea on exertion, denies any cough, mucus production, hemoptysis, fevers, chills. Maintain on Spiriva and albuterol PRN for many years.  Pets: None Occupation: Used to work for [employer].  Retired in the 1990s. Exposures: No known exposures, no mold, hot tub Smoking history: 60 pack year smoking hisotry. Continues to smoke 1/2 ppd Travel History: Born and raised in [place].  No significant travel  Interim history: She is had a follow-up CT of the chest last month and is here for review Continues on Spiriva.  States that dyspnea on exertion is at baseline.  She still continues to smoke  Outpatient Encounter Medications as of 12/18/2017  Medication Sig  . albuterol (PROVENTIL HFA;VENTOLIN HFA) 108 (90 BASE) MCG/ACT inhaler Inhale 2 puffs into the lungs every 6 (six) hours as needed for wheezing.  Marland Kitchen amLODipine (NORVASC) 5 MG tablet Take 1 tablet (5 mg total) by mouth daily.  Marland Kitchen aspirin 81 MG tablet Take 81 mg by mouth daily.  Marland Kitchen atorvastatin (LIPITOR) 40 MG tablet Take 1 tablet (40 mg total) by mouth daily.  . Black Cohosh-Soy-Ginkgo-Magnol (ESTROVEN MOOD & MEMORY PO) Take 1 tablet by mouth.  Marland Kitchen glucosamine-chondroitin 500-400 MG tablet Take 1 tablet by mouth once.  Marland Kitchen guaifenesin (HUMIBID E) 400 MG TABS tablet Take 400 mg by mouth once.  . hydrochlorothiazide (HYDRODIURIL) 25 MG tablet Take 1 tablet (25 mg total) by mouth daily.  . mometasone (ELOCON) 0.1 % lotion Apply daily topically.  . Multiple Vitamin (MULTIVITAMIN)  tablet Take 1 tablet by mouth daily.  Marland Kitchen tiotropium (SPIRIVA) 18 MCG inhalation capsule Place 18 mcg into inhaler and inhale daily.  . [DISCONTINUED] nicotine (NICODERM CQ - DOSED IN MG/24 HOURS) 21 mg/24hr patch Place 1 patch (21 mg total) onto the skin daily. May substitute formulary preferred brand.   No facility-administered encounter medications on file as of 12/18/2017.     Allergies as of 12/18/2017 - Review Complete 12/18/2017  Allergen Reaction Noted  . Codeine Anaphylaxis and Swelling 04/27/2013  . Penicillins Anaphylaxis and Swelling 04/27/2013    Past Medical History:  Diagnosis Date  . Allergic rhinitis   . Asthma 2008  . Hypertension     Past Surgical History:  Procedure Laterality Date  . CATARACT EXTRACTION Left 05/08/2016  . CATARACT EXTRACTION Right 05/22/2016  . TOTAL ABDOMINAL HYSTERECTOMY W/ BILATERAL SALPINGOOPHORECTOMY Bilateral 1975    Family History  Problem Relation Age of Onset  . Heart disease Mother   . Heart disease Father   . Hypertension Father   . Heart disease Sister   . Stroke Sister   . Diabetes Sister     Social History   Socioeconomic History  . Marital status: Widowed    Spouse name: Not on file  . Number of children: Not on file  . Years of education: Not on file  . Highest education level: Not on file  Occupational History  . Not on file  Social Needs  . Financial resource strain: Not on file  . Food insecurity:  Worry: Not on file    Inability: Not on file  . Transportation needs:    Medical: Not on file    Non-medical: Not on file  Tobacco Use  . Smoking status: Current Every Day Smoker    Packs/day: 0.50    Years: 60.00    Pack years: 30.00    Types: Cigarettes    Start date: 08/21/1955  . Smokeless tobacco: Never Used  . Tobacco comment: 3 ppd for 20+ years, has cut down to 0.5 ppd in the past 3 months  Substance and Sexual Activity  . Alcohol use: Yes    Alcohol/week: 0.6 oz    Types: 1 Cans of beer per week     Comment: occ  . Drug use: No  . Sexual activity: Never  Lifestyle  . Physical activity:    Days per week: Not on file    Minutes per session: Not on file  . Stress: Not on file  Relationships  . Social connections:    Talks on phone: Not on file    Gets together: Not on file    Attends religious service: Not on file    Active member of club or organization: Not on file    Attends meetings of clubs or organizations: Not on file    Relationship status: Not on file  . Intimate partner violence:    Fear of current or ex partner: Not on file    Emotionally abused: Not on file    Physically abused: Not on file    Forced sexual activity: Not on file  Other Topics Concern  . Not on file  Social History Narrative  . Not on file    Review of systems: Review of Systems  Constitutional: Negative for fever and chills.  HENT: Negative.   Eyes: Negative for blurred vision.  Respiratory: as per HPI  Cardiovascular: Negative for chest pain and palpitations.  Gastrointestinal: Negative for vomiting, diarrhea, blood per rectum. Genitourinary: Negative for dysuria, urgency, frequency and hematuria.  Musculoskeletal: Negative for myalgias, back pain and joint pain.  Skin: Negative for itching and rash.  Neurological: Negative for dizziness, tremors, focal weakness, seizures and loss of consciousness.  Endo/Heme/Allergies: Negative for environmental allergies.  Psychiatric/Behavioral: Negative for depression, suicidal ideas and hallucinations.  All other systems reviewed and are negative.  Physical Exam: Blood pressure 130/68, pulse (!) 106, height 5\' 4"  (1.626 m), weight 103 lb (46.7 kg), SpO2 97 %. Gen:      No acute distress HEENT:  EOMI, sclera anicteric Neck:     No masses; no thyromegaly Lungs:    Clear to auscultation bilaterally; normal respiratory effort CV:         Regular rate and rhythm; no murmurs Abd:      + bowel sounds; soft, non-tender; no palpable masses, no  distension Ext:    No edema; adequate peripheral perfusion Skin:      Warm and dry; no rash Neuro: alert and oriented x 3 Psych: normal mood and affect  Data Reviewed: FENO 10/15/17 < 5  CT scan 06/12/17-biapical scarring, mild emphysematous changes, right upper lobe scattered subcentimeter pulmonary nodules and groundglass opacities, mucoid impaction  CT scan 12/13/17-moderate emphysema, left upper lobe nodule has increased in size measuring about 10 to 11 mm.  13 mm right upper lobe nodule is decreased.  Another right upper lobe nodule slightly increased in size. Peripheral 2.7 mm nodule associated with cyst cystic changes in the right upper lobe.  Extensive coronary artery  calcification. I have reviewed the images personally  Spirometry 11/29/16 FVC 2.72 [96%], FEV1 1.10 (49%], F/F 40 Severe obstruction  Assessment:  Enlarging lung nodule.  Follow-up CT reviewed.  There is an increase in the left upper lobe nodule which is worrisome for malignancy.  It is now measuring around 10 - 11 mm in size.  It is at the limits of detection for the PET. The right upper lobe nodules appear inflammatory and are of lesser concern.  I will discuss with Dr. Lamonte Sakai, my colleague to see if this can be approached by navigational bronchoscopy but I suspect it still too small to be able to get a good sample.  We will get a CT for short-term follow-up in 3 months and pulmonary function test in case she is up needing either lung biopsy or resection  Refer her to cardiology given findings of significant coronary atherosclerosis, calcification on CT.  Severe COPD Continues on Spiriva and albuterol with stable symptoms.  Active smoker Discussed smoking cessation again and emphasized that she needs to quit smoking.  Prescribe nicotine patches Time spent counseling-5 minutes.  Plan/Recommendations: - CT without contrast in 3 months - PFTs, cardiology consult - Continue Spiriva, albuterol - Smoking cessation,  nicotine patches  Marshell Garfinkel MD  Pulmonary and Critical Care Pager [phone] 12/18/2017, 11:03 AM  CC: [place] Bing, DO

## 2017-12-23 ENCOUNTER — Encounter: Payer: Self-pay | Admitting: Family Medicine

## 2017-12-23 ENCOUNTER — Ambulatory Visit (INDEPENDENT_AMBULATORY_CARE_PROVIDER_SITE_OTHER): Payer: Medicare Other | Admitting: Family Medicine

## 2017-12-23 ENCOUNTER — Other Ambulatory Visit: Payer: Self-pay

## 2017-12-23 VITALS — BP 156/74 | HR 88 | Temp 98.1°F | Ht 64.0 in | Wt 105.0 lb

## 2017-12-23 DIAGNOSIS — R918 Other nonspecific abnormal finding of lung field: Secondary | ICD-10-CM | POA: Diagnosis not present

## 2017-12-23 DIAGNOSIS — F17200 Nicotine dependence, unspecified, uncomplicated: Secondary | ICD-10-CM

## 2017-12-23 DIAGNOSIS — J449 Chronic obstructive pulmonary disease, unspecified: Secondary | ICD-10-CM

## 2017-12-23 NOTE — Assessment & Plan Note (Signed)
Long-standing tobacco use.  Spacious for malignancy.  Will need biopsy following cardiology evaluation later this month. - Follow-up with Dr. Vaughan Browner and Phylliss Bob regarding pulmonary biopsy

## 2017-12-23 NOTE — Assessment & Plan Note (Signed)
Chronic.  Continues to smoke.  Interested in nicotine patch, not yet started. - Patient to start nicotine patch later today with quit date 1 week from today

## 2017-12-23 NOTE — Progress Notes (Signed)
   Subjective   Patient ID: Pamela Russell    DOB: Sep 01, 1945, 72 y.o. female   MRN: [id number]  CC: " Pulmonary nodule"  HPI: Pamela Russell is a 72 y.o. female who presents to clinic today for the following:  Pulmonary nodule: Patient recently underwent LDCT on 12/13/2017 given long-term smoking history.  Findings consistent with lungs-RADS 4B, suspicious.  Patient sees Dr. Vaughan Browner who recommended pulmonary biopsy pending cardiology evaluation on 01/15/2018 with Dr. Phylliss Bob.  Patient does endorse some anxiety about the procedure and results concerning the nodule.  COPD: Patient states she has been more short of breath over the last 2 weeks due to the seasonal pollen.  She has been taking her allergy medications and her Spiriva scheduled.  Patient endorses albuterol use daily with last dose around noon earlier today.  She endorses some tremor following use of albuterol.  Patient feels short of breath when ambulating across the house but is improved with rest.  She denies fevers or chills, productive cough, chest pain, wheeze.  Smoking: Patient continues to smoke a cigarette every other day since her COPD exacerbation.  She was recently seen by her pulmonologist who gave her nicotine patches which she plans on taking up from her pharmacy later today.  Patient has not set a quit date.  Hypertension: Patient endorsing adherence to antihypertensive medications including this morning's dose.  She continues to smoke regularly.  ROS: see HPI for pertinent.  [place]: HTN, COPD, tobacco abuse. Surgical history TAHBSO, cataract b/l. Family history heart disease, stroke, DM. Smoking status reviewed. Medications reviewed.  Objective   BP (!) 156/74   Pulse 88   Temp 98.1 F (36.7 C) (Oral)   Ht 5\' 4"  (1.626 m)   Wt 105 lb (47.6 kg)   SpO2 97%   BMI 18.02 kg/m  Vitals and nursing note reviewed.  General: frail elderly woman, well developed, NAD with non-toxic appearance, tremulous HEENT:  normocephalic, atraumatic, moist mucous membranes Neck: supple, non-tender without lymphadenopathy Cardiovascular: regular rate and rhythm without murmurs, rubs, or gallops Lungs: clear to auscultation bilaterally with normal work of breathing on room air, able to speak in complete sentences but will occasionally pause Skin: warm, dry, no rashes or lesions, cap refill < 2 seconds Extremities: warm and well perfused, normal tone, no edema  Assessment & Plan   Tobacco use disorder Chronic.  Continues to smoke.  Interested in nicotine patch, not yet started. - Patient to start nicotine patch later today with quit date 1 week from today  Pulmonary nodules Long-standing tobacco use.  Spacious for malignancy.  Will need biopsy following cardiology evaluation later this month. - Follow-up with Dr. Vaughan Browner and Phylliss Bob regarding pulmonary biopsy  Chronic obstructive pulmonary disease (COPD) ([place]) Chronic.  Does have increased shortness of breath without signs of exacerbation on exam.  Absent wheezing with good air movement throughout.  Maintained O2 saturation at 95% with ambulation.  No sputum production. - Reviewed return precautions - Continue Spiriva daily and albuterol 2 puffs every 4 hours as needed  No orders of the defined types were placed in this encounter.  No orders of the defined types were placed in this encounter.   Harriet Butte, [place], PGY-2 12/23/2017, 4:53 PM

## 2017-12-23 NOTE — Patient Instructions (Addendum)
Thank you for coming in to see [place] today. Please see below to review our plan for today's visit.  1.  I am okay with you following up in 3 months after we get more definitive answers regarding this pulmonary nodule.  2.  The most important thing for you to do is to stop smoking.  It would be best for you to stop completely 1 week from starting the nicotine patch.   3.  It is important that you call 911 and go to the emergency room if shortness of breath does not improve with the albuterol inhaler or you began having chest pain.  Please call the clinic at [phone] if your symptoms worsen or you have any concerns. It was our pleasure to serve you.  Harriet Butte, [place], PGY-2

## 2017-12-23 NOTE — Assessment & Plan Note (Signed)
Chronic.  Does have increased shortness of breath without signs of exacerbation on exam.  Absent wheezing with good air movement throughout.  Maintained O2 saturation at 95% with ambulation.  No sputum production. - Reviewed return precautions - Continue Spiriva daily and albuterol 2 puffs every 4 hours as needed

## 2018-01-15 ENCOUNTER — Other Ambulatory Visit: Payer: Self-pay | Admitting: Cardiology

## 2018-01-15 ENCOUNTER — Encounter: Payer: Self-pay | Admitting: Cardiology

## 2018-01-15 ENCOUNTER — Ambulatory Visit: Payer: Medicare Other | Admitting: Cardiology

## 2018-01-15 VITALS — BP 158/86 | HR 93 | Ht 64.0 in | Wt 104.2 lb

## 2018-01-15 DIAGNOSIS — Z01818 Encounter for other preprocedural examination: Secondary | ICD-10-CM

## 2018-01-15 DIAGNOSIS — I6523 Occlusion and stenosis of bilateral carotid arteries: Secondary | ICD-10-CM

## 2018-01-15 DIAGNOSIS — E782 Mixed hyperlipidemia: Secondary | ICD-10-CM | POA: Diagnosis not present

## 2018-01-15 DIAGNOSIS — R0989 Other specified symptoms and signs involving the circulatory and respiratory systems: Secondary | ICD-10-CM

## 2018-01-15 DIAGNOSIS — J449 Chronic obstructive pulmonary disease, unspecified: Secondary | ICD-10-CM

## 2018-01-15 DIAGNOSIS — F17200 Nicotine dependence, unspecified, uncomplicated: Secondary | ICD-10-CM

## 2018-01-15 DIAGNOSIS — I10 Essential (primary) hypertension: Secondary | ICD-10-CM | POA: Diagnosis not present

## 2018-01-15 DIAGNOSIS — R0609 Other forms of dyspnea: Secondary | ICD-10-CM

## 2018-01-15 NOTE — Progress Notes (Signed)
Cardiology Office Note:    Date:  01/16/2018   ID:  Kaeley, Vinje October 29, 1945, MRN [id number]  PCP:  [place] Bing, DO  Cardiologist:   Jenkins Rouge, MD   Referring MD: Caroline Bing, DO   Chief Complaint  Patient presents with  . Shortness of Breath    History of Present Illness:    Pamela Russell is a 72 y.o. female who is being seen today for the evaluation of shortness of breath and preop evaluation at the request of Eldridge Bing, DO.   The patient has a past medical history significant for COPD, Tobacco abuse, hypertension.  She has no history of cardiac issues. She was evaluated years ago for sharp chest pain and was told that it was related to stress.   She was found to have a pulmonary nodule on low-dose CT on 12/13/2017 done given her long smoking history.  This nodule is suspicious and pulmonologist would like to do a biopsy.  They requested cardiology evaluation prior to the biopsy.   She has a family history significant for heart disease, stroke, diabetes.  She denies any chest pain/pressure/tigthness. 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 She stays busy, but takes a lot of breaks for breathing. She gets short of breath with minimal activity. She denies swelling. She does elevate on 2 pillows for breathing that she relates to her COPD/asthma. No palpitations, light headedness, dizziness or syncope.     PAD Screen 01/15/2018  Previous PAD dx? No  Previous surgical procedure? No  Pain with walking? No  Feet/toe relief with dangling? No  Painful, non-healing ulcers? No  Extremities discolored? No     Past Medical History:  Diagnosis Date  . Allergic rhinitis   . Asthma 2008  . Chronic obstructive pulmonary disease (COPD) ([place]) 06/08/2016   Meds: Spiriva, albuterol inhaler PFT (11/29/2016): Moderate-severe obstruction by PFT 11/2016  . Ear itching 07/05/2017  . Grieving 06/08/2016  . Hyperlipidemia  09/10/2016   On atorvastatin 40 mg QD, ASA 81 mg QD ASCVD risk 17.1%, mod-high intensity statin Risk factors: FHx sig for MI: mother 52s-50s, and father age 57, HTN, smoker  . Hypertension   . Night sweats 06/08/2016  . Pulmonary nodules 06/12/2017  . Tobacco use disorder 07/06/2016   On nicotine replacement patch. Smoking cigs since age 37. Has 60 pack year history. Has COPD dx on SABA and LAMA.    Past Surgical History:  Procedure Laterality Date  . CATARACT EXTRACTION Left 05/08/2016  . CATARACT EXTRACTION Right 05/22/2016  . TOTAL ABDOMINAL HYSTERECTOMY W/ BILATERAL SALPINGOOPHORECTOMY Bilateral 1975    Current Medications: Current Meds  Medication Sig  . albuterol (PROVENTIL HFA;VENTOLIN HFA) 108 (90 BASE) MCG/ACT inhaler Inhale 2 puffs into the lungs every 6 (six) hours as needed for wheezing.  Marland Kitchen amLODipine (NORVASC) 5 MG tablet Take 1 tablet (5 mg total) by mouth daily.  Marland Kitchen aspirin 81 MG tablet Take 81 mg by mouth daily.  Marland Kitchen atorvastatin (LIPITOR) 40 MG tablet Take 1 tablet (40 mg total) by mouth daily.  Marland Kitchen glucosamine-chondroitin 500-400 MG tablet Take 1 tablet by mouth once.  Marland Kitchen guaifenesin (HUMIBID E) 400 MG TABS tablet Take 400 mg by mouth once.  . hydrochlorothiazide (HYDRODIURIL) 25 MG tablet Take 1 tablet (25 mg total) by mouth daily.  . mometasone (ELOCON) 0.1 % lotion Apply daily topically.  . Multiple Vitamin (MULTIVITAMIN) tablet Take 1 tablet by mouth daily.  Marland Kitchen  Nicotine 21-14-7 MG/24HR KIT Use as directed  . tiotropium (SPIRIVA) 18 MCG inhalation capsule Place 18 mcg into inhaler and inhale daily.     Allergies:   Codeine and Penicillins   Social History   Socioeconomic History  . Marital status: Widowed    Spouse name: Not on file  . Number of children: Not on file  . Years of education: Not on file  . Highest education level: Not on file  Occupational History  . Not on file  Social Needs  . Financial resource strain: Not on file  . Food insecurity:     Worry: Not on file    Inability: Not on file  . Transportation needs:    Medical: Not on file    Non-medical: Not on file  Tobacco Use  . Smoking status: Current Every Day Smoker    Packs/day: 0.50    Years: 60.00    Pack years: 30.00    Types: Cigarettes    Start date: 08/21/1955  . Smokeless tobacco: Never Used  . Tobacco comment: 3 ppd for 20+ years, has cut down to 0.5 ppd in the past 3 months  Substance and Sexual Activity  . Alcohol use: Yes    Alcohol/week: 0.6 oz    Types: 1 Cans of beer per week    Comment: occ  . Drug use: No  . Sexual activity: Never  Lifestyle  . Physical activity:    Days per week: Not on file    Minutes per session: Not on file  . Stress: Not on file  Relationships  . Social connections:    Talks on phone: Not on file    Gets together: Not on file    Attends religious service: Not on file    Active member of club or organization: Not on file    Attends meetings of clubs or organizations: Not on file    Relationship status: Not on file  Other Topics Concern  . Not on file  Social History Narrative  . Not on file     Family History: The patient's family history includes Diabetes in her sister; Heart disease in her father and mother; Heart disease (age of onset: 89) in her sister; Hypertension in her father; Stroke in her mother and sister. ROS:   Please see the history of present illness.     All other systems reviewed and are negative.  EKGs/Labs/Other Studies Reviewed:    The following studies were reviewed today: none  EKG:  EKG is  ordered today.  The ekg ordered today demonstrates NSR with biatrial enlargement and pulmonary disease pattern.   Recent Labs: 05/27/2017: ALT 35; BUN 7; Creatinine, Ser 0.53; Hemoglobin 14.1; Platelets 239; Potassium 3.8; Sodium 136; TSH 1.120   Recent Lipid Panel    Component Value Date/Time   CHOL 239 (H) 06/08/2016 1128   TRIG 101 06/08/2016 1128   HDL 82 06/08/2016 1128   CHOLHDL 2.9  06/08/2016 1128   VLDL 20 06/08/2016 1128   LDLCALC 137 (H) 06/08/2016 1128   LDLDIRECT 55 05/27/2017 1458    Physical Exam:    VS:  BP (!) 158/86   Pulse 93   Ht _0  (1.626 m)   Wt 104 lb 3.2 oz (47.3 kg)   BMI 17.89 kg/m     Wt Readings from Last 3 Encounters:  01/15/18 104 lb 3.2 oz (47.3 kg)  12/23/17 105 lb (47.6 kg)  12/18/17 103 lb (46.7 kg)     GEN:  Thin female HEENT: Normal NECK: No JVD; Left carotid bruit present  LYMPHATICS: No lymphadenopathy CARDIAC: RRR, no murmurs, rubs, gallops RESPIRATORY:  Clear, but diminished, without rales, wheezing or rhonchi  ABDOMEN: Soft, non-tender, non-distended MUSCULOSKELETAL:  No edema; No deformity  SKIN: Warm and dry NEUROLOGIC:  Alert and oriented x 3 PSYCHIATRIC:  Normal affect   ASSESSMENT:    1. Pre-op evaluation   2. Primary hypertension   3. Chronic obstructive pulmonary disease, unspecified COPD type ([place])   4. Tobacco use disorder   5. Mixed hyperlipidemia   6. DOE (dyspnea on exertion)   7. Bruit of left carotid artery    PLAN:   . This patient's case was discussed in depth with Dr. Johnsie Cancel. The plan below was formulated per our discussion.  In order of problems listed above:  Pre-operative cardiac evaluation: CVD risk factors include hypertension, hyperlipidemia, smoking and family history.  The 10-year ASCVD risk score Mikey Bussing DC Brooke Bonito., et al., 2013) is: 30.6%   Values used to calculate the score:     Age: 34 years     Sex: Female     Is Non-Hispanic African American: No     Diabetic: No     Tobacco smoker: Yes     Systolic Blood Pressure: 517 mmHg     Is BP treated: Yes     HDL Cholesterol: 82 mg/dL     Total Cholesterol: 239 mg/dL She has no exertional chest discomfort but does have significant DOE which could be related to her long smoking history vs myocardial ischemia. Will check Lexiscan Myoview to evaluate for myocardial ischemia. Will also check echocardiogram for heart function and  structure.   Hypertension: HCTZ. BP elevated here today. Pt says she is nervous and her BP is usually well controlled.   COPD: Recently found to have a suspicious lung nodule and pulmonology would like to do a biopsy.   Tobacco abuse: Smoked > 1PPD since age 51. She is tryng to quit. Down to 4-5 cigarettes per day. She is using nicotine patches. We discussed importance of cessation.   Hyperlipidemia: Atorvastatin 40 mg with LDL down from 137 to 55, last checked in 05/2017.  Carotid bruit: left. Will check carotid dopplers.    Medication Adjustments/Labs and Tests Ordered: Current medicines are reviewed at length with the patient today.  Concerns regarding medicines are outlined above. Labs and tests ordered and medication changes are outlined in the patient instructions below:  Patient Instructions  Medication Instructions:  Your physician recommends that you continue on your current medications as directed. Please refer to the Current Medication list given to you today.  If you need a refill on your cardiac medications, please contact your pharmacy first.  Labwork: None ordered   Testing/Procedures: Your physician has requested that you have an echocardiogram. Echocardiography is a painless test that uses sound waves to create images of your heart. It provides your doctor with information about the size and shape of your heart and how well your heart's chambers and valves are working. This procedure takes approximately one hour. There are no restrictions for this procedure.  Your physician has requested that you have a lexiscan myoview. For further information please visit [email or web address]. Please follow instruction sheet, as given.  Your physician has requested that you have a carotid duplex. This test is an ultrasound of the carotid arteries in your neck. It looks at blood flow through these arteries that supply the brain with blood. Allow one hour for this exam.  There are no  restrictions or special instructions.   Follow-Up: Your physician wants you to follow-up in: 1 year with Dr. Johnsie Cancel. You will receive a reminder letter in the mail two months in advance. If you don't receive a letter, please call our office to schedule the follow-up appointment.  Any Other Special Instructions Will Be Listed Below (If Applicable).  DASH Eating Plan DASH stands for "Dietary Approaches to Stop Hypertension." The DASH eating plan is a healthy eating plan that has been shown to reduce high blood pressure (hypertension). It may also reduce your risk for type 2 diabetes, heart disease, and stroke. The DASH eating plan may also help with weight loss. What are tips for following this plan? General guidelines  Avoid eating more than 2,300 mg (milligrams) of salt (sodium) a day. If you have hypertension, you may need to reduce your sodium intake to 1,500 mg a day.  Limit alcohol intake to no more than 1 drink a day for nonpregnant women and 2 drinks a day for men. One drink equals 12 oz of beer, 5 oz of wine, or 1 oz of hard liquor.  Work with your health care provider to maintain a healthy body weight or to lose weight. Ask what an ideal weight is for you.  Get at least 30 minutes of exercise that causes your heart to beat faster (aerobic exercise) most days of the week. Activities may include walking, swimming, or biking.  Work with your health care provider or diet and nutrition specialist (dietitian) to adjust your eating plan to your individual calorie needs. Reading food labels  Check food labels for the amount of sodium per serving. Choose foods with less than 5 percent of the Daily Value of sodium. Generally, foods with less than 300 mg of sodium per serving fit into this eating plan.  To find whole grains, look for the word "whole" as the first word in the ingredient list. Shopping  Buy products labeled as "low-sodium" or "no salt added."  Buy fresh foods. Avoid canned  foods and premade or frozen meals. Cooking  Avoid adding salt when cooking. Use salt-free seasonings or herbs instead of table salt or sea salt. Check with your health care provider or pharmacist before using salt substitutes.  Do not fry foods. Cook foods using healthy methods such as baking, boiling, grilling, and broiling instead.  Cook with heart-healthy oils, such as olive, canola, soybean, or sunflower oil. Meal planning   Eat a balanced diet that includes: ? 5 or more servings of fruits and vegetables each day. At each meal, try to fill half of your plate with fruits and vegetables. ? Up to 6-8 servings of whole grains each day. ? Less than 6 oz of lean meat, poultry, or fish each day. A 3-oz serving of meat is about the same size as a deck of cards. One egg equals 1 oz. ? 2 servings of low-fat dairy each day. ? A serving of nuts, seeds, or beans 5 times each week. ? Heart-healthy fats. Healthy fats called Omega-3 fatty acids are found in foods such as flaxseeds and coldwater fish, like sardines, salmon, and mackerel.  Limit how much you eat of the following: ? Canned or prepackaged foods. ? Food that is high in trans fat, such as fried foods. ? Food that is high in saturated fat, such as fatty meat. ? Sweets, desserts, sugary drinks, and other foods with added sugar. ? Full-fat dairy products.  Do not salt foods before  eating.  Try to eat at least 2 vegetarian meals each week.  Eat more home-cooked food and less restaurant, buffet, and fast food.  When eating at a restaurant, ask that your food be prepared with less salt or no salt, if possible. What foods are recommended? The items listed may not be a complete list. Talk with your dietitian about what dietary choices are best for you. Grains Whole-grain or whole-wheat bread. Whole-grain or whole-wheat pasta. Brown rice. [place]. Bulgur. Whole-grain and low-sodium cereals. Pita bread. Low-fat, low-sodium crackers.  Whole-wheat flour tortillas. Vegetables Fresh or frozen vegetables (raw, steamed, roasted, or grilled). Low-sodium or reduced-sodium tomato and vegetable juice. Low-sodium or reduced-sodium tomato sauce and tomato paste. Low-sodium or reduced-sodium canned vegetables. Fruits All fresh, dried, or frozen fruit. Canned fruit in natural juice (without added sugar). Meat and other protein foods Skinless chicken or [place]. Ground chicken or [place]. Pork with fat trimmed off. Fish and seafood. Egg whites. Dried beans, peas, or lentils. Unsalted nuts, nut butters, and seeds. Unsalted canned beans. Lean cuts of beef with fat trimmed off. Low-sodium, lean deli meat. Dairy Low-fat (1%) or fat-free (skim) milk. Fat-free, low-fat, or reduced-fat cheeses. Nonfat, low-sodium ricotta or cottage cheese. Low-fat or nonfat yogurt. Low-fat, low-sodium cheese. Fats and oils Soft margarine without trans fats. Vegetable oil. Low-fat, reduced-fat, or light mayonnaise and salad dressings (reduced-sodium). Canola, safflower, olive, soybean, and sunflower oils. Avocado. Seasoning and other foods Herbs. Spices. Seasoning mixes without salt. Unsalted popcorn and pretzels. Fat-free sweets. What foods are not recommended? The items listed may not be a complete list. Talk with your dietitian about what dietary choices are best for you. Grains Baked goods made with fat, such as croissants, muffins, or some breads. Dry pasta or rice meal packs. Vegetables Creamed or fried vegetables. Vegetables in a cheese sauce. Regular canned vegetables (not low-sodium or reduced-sodium). Regular canned tomato sauce and paste (not low-sodium or reduced-sodium). Regular tomato and vegetable juice (not low-sodium or reduced-sodium). Angie Fava. Olives. Fruits Canned fruit in a light or heavy syrup. Fried fruit. Fruit in cream or butter sauce. Meat and other protein foods Fatty cuts of meat. Ribs. Fried meat. Berniece Salines. Sausage. Bologna and other  processed lunch meats. Salami. Fatback. Hotdogs. Bratwurst. Salted nuts and seeds. Canned beans with added salt. Canned or smoked fish. Whole eggs or egg yolks. Chicken or [place] with skin. Dairy Whole or 2% milk, cream, and half-and-half. Whole or full-fat cream cheese. Whole-fat or sweetened yogurt. Full-fat cheese. Nondairy creamers. Whipped toppings. Processed cheese and cheese spreads. Fats and oils Butter. Stick margarine. Lard. Shortening. Ghee. Bacon fat. Tropical oils, such as coconut, palm kernel, or palm oil. Seasoning and other foods Salted popcorn and pretzels. Onion salt, garlic salt, seasoned salt, table salt, and sea salt. Worcestershire sauce. Tartar sauce. Barbecue sauce. Teriyaki sauce. Soy sauce, including reduced-sodium. Steak sauce. Canned and packaged gravies. Fish sauce. Oyster sauce. Cocktail sauce. Horseradish that you find on the shelf. Ketchup. Mustard. Meat flavorings and tenderizers. Bouillon cubes. Hot sauce and Tabasco sauce. Premade or packaged marinades. Premade or packaged taco seasonings. Relishes. Regular salad dressings. Where to find more information:  National Heart, Lung, and [place]: https://wilson-eaton.com/  American Heart Association: www.heart.org Summary  The DASH eating plan is a healthy eating plan that has been shown to reduce high blood pressure (hypertension). It may also reduce your risk for type 2 diabetes, heart disease, and stroke.  With the DASH eating plan, you should limit salt (sodium) intake to 2,300 mg a  day. If you have hypertension, you may need to reduce your sodium intake to 1,500 mg a day.  When on the DASH eating plan, aim to eat more fresh fruits and vegetables, whole grains, lean proteins, low-fat dairy, and heart-healthy fats.  Work with your health care provider or diet and nutrition specialist (dietitian) to adjust your eating plan to your individual calorie needs. This information is not intended to replace advice given to  you by your health care provider. Make sure you discuss any questions you have with your health care provider. Document Released: 07/26/2011 Document Revised: 07/30/2016 Document Reviewed: 07/30/2016 Elsevier Interactive Patient Education  2018 [employer].   Smoking Tobacco Information Smoking tobacco will very likely harm your health. Tobacco contains a poisonous (toxic), colorless chemical called nicotine. Nicotine affects the brain and makes tobacco addictive. This change in your brain can make it hard to stop smoking. Tobacco also has other toxic chemicals that can hurt your body and raise your risk of many cancers. How can smoking tobacco affect me? Smoking tobacco can increase your chances of having serious health conditions, such as:  Cancer. Smoking is most commonly associated with lung cancer, but can lead to cancer in other parts of the body.  Chronic obstructive pulmonary disease (COPD). This is a long-term lung condition that makes it hard to breathe. It also gets worse over time.  High blood pressure (hypertension), heart disease, stroke, or heart attack.  Lung infections, such as pneumonia.  Cataracts. This is when the lenses in the eyes become clouded.  Digestive problems. This may include peptic ulcers, heartburn, and gastroesophageal reflux disease (GERD).  Oral health problems, such as gum disease and tooth loss.  Loss of taste and smell.  Smoking can affect your appearance by causing:  Wrinkles.  Yellow or stained teeth, fingers, and fingernails.  Smoking tobacco can also affect your social life.  Many workplaces, [employer], hotels, and public places are tobacco-free. This means that you may experience challenges in finding places to smoke when away from home.  The cost of a smoking habit can be expensive. Expenses for someone who smokes come in two ways: ? You spend money on a regular basis to buy tobacco. ? Your health care costs in the long-term are  higher if you smoke.  Tobacco smoke can also affect the health of those around you. Children of smokers have greater chances of: ? Sudden infant death syndrome (SIDS). ? Ear infections. ? Lung infections.  What lifestyle changes can be made?  Do not start smoking. Quit if you already do.  To quit smoking: ? Make a plan to quit smoking and commit yourself to it. Look for programs to help you and ask your health care provider for recommendations and ideas. ? Talk with your health care provider about using nicotine replacement medicines to help you quit. Medicine replacement medicines include gum, lozenges, patches, sprays, or pills. ? Do not replace cigarette smoking with electronic cigarettes, which are commonly called e-cigarettes. The safety of e-cigarettes is not known, and some may contain harmful chemicals. ? Avoid places, people, or situations that tempt you to smoke. ? If you try to quit but return to smoking, don't give up hope. It is very common for people to try a number of times before they fully succeed. When you feel ready again, give it another try.  Quitting smoking might affect the way you eat as well as your weight. Be prepared to monitor your eating habits. Get support in  planning and following a healthy diet.  Ask your health care provider about having regular tests (screenings) to check for cancer. This may include blood tests, imaging tests, and other tests.  Exercise regularly. Consider taking walks, joining a gym, or doing yoga or exercise classes.  Develop skills to manage your stress. These skills include meditation. What are the benefits of quitting smoking? By quitting smoking, you may:  Lower your risk of getting cancer and other diseases caused by smoking.  Live longer.  Breathe better.  Lower your blood pressure and heart rate.  Stop your addiction to tobacco.  Stop creating secondhand smoke that hurts other people.  Improve your sense of taste and  smell.  Look better over time, due to having fewer wrinkles and less staining.  What can happen if changes are not made? If you do not stop smoking, you may:  Get cancer and other diseases.  Develop COPD or other long-term (chronic) lung conditions.  Develop serious problems with your heart and blood vessels (cardiovascular system).  Need more tests to screen for problems caused by smoking.  Have higher, long-term healthcare costs from medicines or treatments related to smoking.  Continue to have worsening changes in your lungs, mouth, and nose.  Where to find support: To get support to quit smoking, consider:  Asking your health care provider for more information and resources.  Taking classes to learn more about quitting smoking.  Looking for local organizations that offer resources about quitting smoking.  Joining a support group for people who want to quit smoking in your local community.  Where to find more information: You may find more information about quitting smoking from:  HelpGuide.org: www.helpguide.org/articles/addictions/how-to-quit-smoking.htm  https://hall.com/: smokefree.gov  American Lung Association: www.lung.org  Contact a health care provider if:  You have problems breathing.  Your lips, nose, or fingers turn blue.  You have chest pain.  You are coughing up blood.  You feel faint or you pass out.  You have other noticeable changes that cause you to worry. Summary  Smoking tobacco can negatively affect your health, the health of those around you, your finances, and your social life.  Do not start smoking. Quit if you already do. If you need help quitting, ask your health care provider.  Think about joining a support group for people who want to quit smoking in your local community. There are many effective programs that will help you to quit this behavior. This information is not intended to replace advice given to you by your health care  provider. Make sure you discuss any questions you have with your health care provider. Document Released: 08/21/2016 Document Revised: 08/21/2016 Document Reviewed: 08/21/2016 Elsevier Interactive Patient Education  [employer].   Thank you for choosing [facility]    [phone]  If you need a refill on your cardiac medications before your next appointment, please call your pharmacy.      Signed, Daune Perch, NP  01/16/2018 6:07 AM    [place]

## 2018-01-15 NOTE — Patient Instructions (Signed)
Medication Instructions:  Your physician recommends that you continue on your current medications as directed. Please refer to the Current Medication list given to you today.  If you need a refill on your cardiac medications, please contact your pharmacy first.  Labwork: None ordered   Testing/Procedures: Your physician has requested that you have an echocardiogram. Echocardiography is a painless test that uses sound waves to create images of your heart. It provides your doctor with information about the size and shape of your heart and how well your heart's chambers and valves are working. This procedure takes approximately one hour. There are no restrictions for this procedure.  Your physician has requested that you have a lexiscan myoview. For further information please visit [email or web address]. Please follow instruction sheet, as given.  Your physician has requested that you have a carotid duplex. This test is an ultrasound of the carotid arteries in your neck. It looks at blood flow through these arteries that supply the brain with blood. Allow one hour for this exam. There are no restrictions or special instructions.   Follow-Up: Your physician wants you to follow-up in: 1 year with Dr. Johnsie Cancel. You will receive a reminder letter in the mail two months in advance. If you don't receive a letter, please call our office to schedule the follow-up appointment.  Any Other Special Instructions Will Be Listed Below (If Applicable).  DASH Eating Plan DASH stands for "Dietary Approaches to Stop Hypertension." The DASH eating plan is a healthy eating plan that has been shown to reduce high blood pressure (hypertension). It may also reduce your risk for type 2 diabetes, heart disease, and stroke. The DASH eating plan may also help with weight loss. What are tips for following this plan? General guidelines  Avoid eating more than 2,300 mg (milligrams) of salt (sodium) a day. If you have  hypertension, you may need to reduce your sodium intake to 1,500 mg a day.  Limit alcohol intake to no more than 1 drink a day for nonpregnant women and 2 drinks a day for men. One drink equals 12 oz of beer, 5 oz of wine, or 1 oz of hard liquor.  Work with your health care provider to maintain a healthy body weight or to lose weight. Ask what an ideal weight is for you.  Get at least 30 minutes of exercise that causes your heart to beat faster (aerobic exercise) most days of the week. Activities may include walking, swimming, or biking.  Work with your health care provider or diet and nutrition specialist (dietitian) to adjust your eating plan to your individual calorie needs. Reading food labels  Check food labels for the amount of sodium per serving. Choose foods with less than 5 percent of the Daily Value of sodium. Generally, foods with less than 300 mg of sodium per serving fit into this eating plan.  To find whole grains, look for the word "whole" as the first word in the ingredient list. Shopping  Buy products labeled as "low-sodium" or "no salt added."  Buy fresh foods. Avoid canned foods and premade or frozen meals. Cooking  Avoid adding salt when cooking. Use salt-free seasonings or herbs instead of table salt or sea salt. Check with your health care provider or pharmacist before using salt substitutes.  Do not fry foods. Cook foods using healthy methods such as baking, boiling, grilling, and broiling instead.  Cook with heart-healthy oils, such as olive, canola, soybean, or sunflower oil. Meal planning   Eat a balanced  diet that includes: ? 5 or more servings of fruits and vegetables each day. At each meal, try to fill half of your plate with fruits and vegetables. ? Up to 6-8 servings of whole grains each day. ? Less than 6 oz of lean meat, poultry, or fish each day. A 3-oz serving of meat is about the same size as a deck of cards. One egg equals 1 oz. ? 2 servings of  low-fat dairy each day. ? A serving of nuts, seeds, or beans 5 times each week. ? Heart-healthy fats. Healthy fats called Omega-3 fatty acids are found in foods such as flaxseeds and coldwater fish, like sardines, salmon, and mackerel.  Limit how much you eat of the following: ? Canned or prepackaged foods. ? Food that is high in trans fat, such as fried foods. ? Food that is high in saturated fat, such as fatty meat. ? Sweets, desserts, sugary drinks, and other foods with added sugar. ? Full-fat dairy products.  Do not salt foods before eating.  Try to eat at least 2 vegetarian meals each week.  Eat more home-cooked food and less restaurant, buffet, and fast food.  When eating at a restaurant, ask that your food be prepared with less salt or no salt, if possible. What foods are recommended? The items listed may not be a complete list. Talk with your dietitian about what dietary choices are best for you. Grains Whole-grain or whole-wheat bread. Whole-grain or whole-wheat pasta. Brown rice. [place]. Bulgur. Whole-grain and low-sodium cereals. Pita bread. Low-fat, low-sodium crackers. Whole-wheat flour tortillas. Vegetables Fresh or frozen vegetables (raw, steamed, roasted, or grilled). Low-sodium or reduced-sodium tomato and vegetable juice. Low-sodium or reduced-sodium tomato sauce and tomato paste. Low-sodium or reduced-sodium canned vegetables. Fruits All fresh, dried, or frozen fruit. Canned fruit in natural juice (without added sugar). Meat and other protein foods Skinless chicken or [place]. Ground chicken or [place]. Pork with fat trimmed off. Fish and seafood. Egg whites. Dried beans, peas, or lentils. Unsalted nuts, nut butters, and seeds. Unsalted canned beans. Lean cuts of beef with fat trimmed off. Low-sodium, lean deli meat. Dairy Low-fat (1%) or fat-free (skim) milk. Fat-free, low-fat, or reduced-fat cheeses. Nonfat, low-sodium ricotta or cottage cheese. Low-fat or  nonfat yogurt. Low-fat, low-sodium cheese. Fats and oils Soft margarine without trans fats. Vegetable oil. Low-fat, reduced-fat, or light mayonnaise and salad dressings (reduced-sodium). Canola, safflower, olive, soybean, and sunflower oils. Avocado. Seasoning and other foods Herbs. Spices. Seasoning mixes without salt. Unsalted popcorn and pretzels. Fat-free sweets. What foods are not recommended? The items listed may not be a complete list. Talk with your dietitian about what dietary choices are best for you. Grains Baked goods made with fat, such as croissants, muffins, or some breads. Dry pasta or rice meal packs. Vegetables Creamed or fried vegetables. Vegetables in a cheese sauce. Regular canned vegetables (not low-sodium or reduced-sodium). Regular canned tomato sauce and paste (not low-sodium or reduced-sodium). Regular tomato and vegetable juice (not low-sodium or reduced-sodium). Angie Fava. Olives. Fruits Canned fruit in a light or heavy syrup. Fried fruit. Fruit in cream or butter sauce. Meat and other protein foods Fatty cuts of meat. Ribs. Fried meat. Berniece Salines. Sausage. Bologna and other processed lunch meats. Salami. Fatback. Hotdogs. Bratwurst. Salted nuts and seeds. Canned beans with added salt. Canned or smoked fish. Whole eggs or egg yolks. Chicken or [place] with skin. Dairy Whole or 2% milk, cream, and half-and-half. Whole or full-fat cream cheese. Whole-fat or sweetened yogurt. Full-fat cheese. Nondairy  creamers. Whipped toppings. Processed cheese and cheese spreads. Fats and oils Butter. Stick margarine. Lard. Shortening. Ghee. Bacon fat. Tropical oils, such as coconut, palm kernel, or palm oil. Seasoning and other foods Salted popcorn and pretzels. Onion salt, garlic salt, seasoned salt, table salt, and sea salt. Worcestershire sauce. Tartar sauce. Barbecue sauce. Teriyaki sauce. Soy sauce, including reduced-sodium. Steak sauce. Canned and packaged gravies. Fish sauce. Oyster  sauce. Cocktail sauce. Horseradish that you find on the shelf. Ketchup. Mustard. Meat flavorings and tenderizers. Bouillon cubes. Hot sauce and Tabasco sauce. Premade or packaged marinades. Premade or packaged taco seasonings. Relishes. Regular salad dressings. Where to find more information:  National Heart, Lung, and [place]: https://wilson-eaton.com/  American Heart Association: www.heart.org Summary  The DASH eating plan is a healthy eating plan that has been shown to reduce high blood pressure (hypertension). It may also reduce your risk for type 2 diabetes, heart disease, and stroke.  With the DASH eating plan, you should limit salt (sodium) intake to 2,300 mg a day. If you have hypertension, you may need to reduce your sodium intake to 1,500 mg a day.  When on the DASH eating plan, aim to eat more fresh fruits and vegetables, whole grains, lean proteins, low-fat dairy, and heart-healthy fats.  Work with your health care provider or diet and nutrition specialist (dietitian) to adjust your eating plan to your individual calorie needs. This information is not intended to replace advice given to you by your health care provider. Make sure you discuss any questions you have with your health care provider. Document Released: 07/26/2011 Document Revised: 07/30/2016 Document Reviewed: 07/30/2016 Elsevier Interactive Patient Education  2018 [employer].   Smoking Tobacco Information Smoking tobacco will very likely harm your health. Tobacco contains a poisonous (toxic), colorless chemical called nicotine. Nicotine affects the brain and makes tobacco addictive. This change in your brain can make it hard to stop smoking. Tobacco also has other toxic chemicals that can hurt your body and raise your risk of many cancers. How can smoking tobacco affect me? Smoking tobacco can increase your chances of having serious health conditions, such as:  Cancer. Smoking is most commonly associated with lung  cancer, but can lead to cancer in other parts of the body.  Chronic obstructive pulmonary disease (COPD). This is a long-term lung condition that makes it hard to breathe. It also gets worse over time.  High blood pressure (hypertension), heart disease, stroke, or heart attack.  Lung infections, such as pneumonia.  Cataracts. This is when the lenses in the eyes become clouded.  Digestive problems. This may include peptic ulcers, heartburn, and gastroesophageal reflux disease (GERD).  Oral health problems, such as gum disease and tooth loss.  Loss of taste and smell.  Smoking can affect your appearance by causing:  Wrinkles.  Yellow or stained teeth, fingers, and fingernails.  Smoking tobacco can also affect your social life.  Many workplaces, [employer], hotels, and public places are tobacco-free. This means that you may experience challenges in finding places to smoke when away from home.  The cost of a smoking habit can be expensive. Expenses for someone who smokes come in two ways: ? You spend money on a regular basis to buy tobacco. ? Your health care costs in the long-term are higher if you smoke.  Tobacco smoke can also affect the health of those around you. Children of smokers have greater chances of: ? Sudden infant death syndrome (SIDS). ? Ear infections. ? Lung infections.  What  lifestyle changes can be made?  Do not start smoking. Quit if you already do.  To quit smoking: ? Make a plan to quit smoking and commit yourself to it. Look for programs to help you and ask your health care provider for recommendations and ideas. ? Talk with your health care provider about using nicotine replacement medicines to help you quit. Medicine replacement medicines include gum, lozenges, patches, sprays, or pills. ? Do not replace cigarette smoking with electronic cigarettes, which are commonly called e-cigarettes. The safety of e-cigarettes is not known, and some may contain  harmful chemicals. ? Avoid places, people, or situations that tempt you to smoke. ? If you try to quit but return to smoking, don't give up hope. It is very common for people to try a number of times before they fully succeed. When you feel ready again, give it another try.  Quitting smoking might affect the way you eat as well as your weight. Be prepared to monitor your eating habits. Get support in planning and following a healthy diet.  Ask your health care provider about having regular tests (screenings) to check for cancer. This may include blood tests, imaging tests, and other tests.  Exercise regularly. Consider taking walks, joining a gym, or doing yoga or exercise classes.  Develop skills to manage your stress. These skills include meditation. What are the benefits of quitting smoking? By quitting smoking, you may:  Lower your risk of getting cancer and other diseases caused by smoking.  Live longer.  Breathe better.  Lower your blood pressure and heart rate.  Stop your addiction to tobacco.  Stop creating secondhand smoke that hurts other people.  Improve your sense of taste and smell.  Look better over time, due to having fewer wrinkles and less staining.  What can happen if changes are not made? If you do not stop smoking, you may:  Get cancer and other diseases.  Develop COPD or other long-term (chronic) lung conditions.  Develop serious problems with your heart and blood vessels (cardiovascular system).  Need more tests to screen for problems caused by smoking.  Have higher, long-term healthcare costs from medicines or treatments related to smoking.  Continue to have worsening changes in your lungs, mouth, and nose.  Where to find support: To get support to quit smoking, consider:  Asking your health care provider for more information and resources.  Taking classes to learn more about quitting smoking.  Looking for local organizations that offer  resources about quitting smoking.  Joining a support group for people who want to quit smoking in your local community.  Where to find more information: You may find more information about quitting smoking from:  HelpGuide.org: www.helpguide.org/articles/addictions/how-to-quit-smoking.htm  https://hall.com/: smokefree.gov  American Lung Association: www.lung.org  Contact a health care provider if:  You have problems breathing.  Your lips, nose, or fingers turn blue.  You have chest pain.  You are coughing up blood.  You feel faint or you pass out.  You have other noticeable changes that cause you to worry. Summary  Smoking tobacco can negatively affect your health, the health of those around you, your finances, and your social life.  Do not start smoking. Quit if you already do. If you need help quitting, ask your health care provider.  Think about joining a support group for people who want to quit smoking in your local community. There are many effective programs that will help you to quit this behavior. This information is not intended  to replace advice given to you by your health care provider. Make sure you discuss any questions you have with your health care provider. Document Released: 08/21/2016 Document Revised: 08/21/2016 Document Reviewed: 08/21/2016 Elsevier Interactive Patient Education  [employer].   Thank you for choosing [facility]    [phone]  If you need a refill on your cardiac medications before your next appointment, please call your pharmacy.

## 2018-01-20 ENCOUNTER — Ambulatory Visit (HOSPITAL_COMMUNITY)
Admission: RE | Admit: 2018-01-20 | Discharge: 2018-01-20 | Disposition: A | Discharge status: Home / Self Care | Payer: Medicare Other | Source: Ambulatory Visit | Attending: Internal Medicine | Admitting: Internal Medicine

## 2018-01-20 ENCOUNTER — Telehealth (HOSPITAL_COMMUNITY): Payer: Self-pay | Admitting: *Deleted

## 2018-01-20 DIAGNOSIS — I6523 Occlusion and stenosis of bilateral carotid arteries: Secondary | ICD-10-CM | POA: Diagnosis not present

## 2018-01-20 DIAGNOSIS — R0989 Other specified symptoms and signs involving the circulatory and respiratory systems: Secondary | ICD-10-CM | POA: Diagnosis present

## 2018-01-20 NOTE — Telephone Encounter (Signed)
Patient given detailed instructions per Myocardial Perfusion Study Information Sheet for the test on 01/22/18 at 1030. Patient notified to arrive 15 minutes early and that it is imperative to arrive on time for appointment to keep from having the test rescheduled.  If you need to cancel or reschedule your appointment, please call the office within 24 hours of your appointment. . Patient verbalized understanding.Pamela Russell, [name]

## 2018-01-22 ENCOUNTER — Ambulatory Visit (HOSPITAL_BASED_OUTPATIENT_CLINIC_OR_DEPARTMENT_OTHER): Payer: Medicare Other

## 2018-01-22 ENCOUNTER — Other Ambulatory Visit: Payer: Self-pay

## 2018-01-22 ENCOUNTER — Ambulatory Visit (HOSPITAL_COMMUNITY): Payer: Medicare Other | Attending: Cardiovascular Disease

## 2018-01-22 DIAGNOSIS — J449 Chronic obstructive pulmonary disease, unspecified: Secondary | ICD-10-CM | POA: Diagnosis not present

## 2018-01-22 DIAGNOSIS — R0609 Other forms of dyspnea: Secondary | ICD-10-CM

## 2018-01-22 DIAGNOSIS — E785 Hyperlipidemia, unspecified: Secondary | ICD-10-CM | POA: Insufficient documentation

## 2018-01-22 DIAGNOSIS — I361 Nonrheumatic tricuspid (valve) insufficiency: Secondary | ICD-10-CM | POA: Diagnosis not present

## 2018-01-22 LAB — MYOCARDIAL PERFUSION IMAGING
CHL CUP RESTING HR STRESS: 82 {beats}/min
LVDIAVOL: 51 mL (ref 46–106)
LVSYSVOL: 12 mL
NUC STRESS TID: 0.99
Peak HR: 103 {beats}/min
RATE: 0.33
SDS: 4
SRS: 15
SSS: 19

## 2018-01-22 MED ORDER — TECHNETIUM TC 99M TETROFOSMIN IV KIT
10.2000 | PACK | Freq: Once | INTRAVENOUS | Status: AC | PRN
  Administered 2018-01-22: 10.2 via INTRAVENOUS
  Filled 2018-01-22: qty 11

## 2018-01-22 MED ORDER — TECHNETIUM TC 99M TETROFOSMIN IV KIT
31.9000 | PACK | Freq: Once | INTRAVENOUS | Status: AC | PRN
  Administered 2018-01-22: 31.9 via INTRAVENOUS
  Filled 2018-01-22: qty 32

## 2018-01-22 MED ORDER — REGADENOSON 0.4 MG/5ML IV SOLN
0.4000 mg | Freq: Once | INTRAVENOUS | Status: AC
  Administered 2018-01-22: 0.4 mg via INTRAVENOUS

## 2018-01-24 ENCOUNTER — Telehealth: Payer: Self-pay | Admitting: Family Medicine

## 2018-01-24 NOTE — Telephone Encounter (Signed)
Contacted patient regarding normal stress test and echocardiogram showing mild carotid stenosis, otherwise negative.  Patient cleared for lung biopsy.  Patient appreciative without further questions or concerns.  Harriet But[place], [employer], PGY-2

## 2018-03-20 ENCOUNTER — Ambulatory Visit (INDEPENDENT_AMBULATORY_CARE_PROVIDER_SITE_OTHER)
Admission: RE | Admit: 2018-03-20 | Discharge: 2018-03-20 | Disposition: A | Discharge status: Home / Self Care | Payer: Medicare Other | Source: Ambulatory Visit | Attending: Pulmonary Disease | Admitting: Pulmonary Disease

## 2018-03-20 DIAGNOSIS — R911 Solitary pulmonary nodule: Secondary | ICD-10-CM | POA: Diagnosis not present

## 2018-03-24 ENCOUNTER — Ambulatory Visit: Payer: Medicare Other | Admitting: Pulmonary Disease

## 2018-03-24 ENCOUNTER — Other Ambulatory Visit: Payer: Self-pay | Admitting: Pulmonary Disease

## 2018-03-24 ENCOUNTER — Ambulatory Visit (INDEPENDENT_AMBULATORY_CARE_PROVIDER_SITE_OTHER): Payer: Medicare Other | Admitting: Pulmonary Disease

## 2018-03-24 ENCOUNTER — Encounter: Payer: Self-pay | Admitting: Pulmonary Disease

## 2018-03-24 VITALS — BP 132/60 | HR 87 | Ht 62.6 in | Wt 105.0 lb

## 2018-03-24 DIAGNOSIS — J449 Chronic obstructive pulmonary disease, unspecified: Secondary | ICD-10-CM | POA: Diagnosis not present

## 2018-03-24 DIAGNOSIS — R911 Solitary pulmonary nodule: Secondary | ICD-10-CM

## 2018-03-24 LAB — PULMONARY FUNCTION TEST
DL/VA % pred: 66 %
DL/VA: 3.08 ml/min/mmHg/L
DLCO UNC % PRED: 61 %
DLCO unc: 13.68 ml/min/mmHg
FEF 25-75 PRE: 0.52 L/s
FEF 25-75 Post: 0.63 L/sec
FEF2575-%CHANGE-POST: 21 %
FEF2575-%PRED-POST: 35 %
FEF2575-%Pred-Pre: 29 %
FEV1-%Change-Post: 0 %
FEV1-%PRED-PRE: 56 %
FEV1-%Pred-Post: 57 %
FEV1-POST: 1.19 L
FEV1-Pre: 1.18 L
FEV1FVC-%CHANGE-POST: -3 %
FEV1FVC-%Pred-Pre: 71 %
FEV6-%CHANGE-POST: 5 %
FEV6-%Pred-Post: 82 %
FEV6-%Pred-Pre: 78 %
FEV6-Post: 2.19 L
FEV6-Pre: 2.08 L
FEV6FVC-%Change-Post: 1 %
FEV6FVC-%Pred-Post: 101 %
FEV6FVC-%Pred-Pre: 100 %
FVC-%CHANGE-POST: 3 %
FVC-%Pred-Post: 81 %
FVC-%Pred-Pre: 78 %
FVC-Post: 2.27 L
FVC-Pre: 2.19 L
POST FEV1/FVC RATIO: 52 %
POST FEV6/FVC RATIO: 96 %
PRE FEV1/FVC RATIO: 54 %
Pre FEV6/FVC Ratio: 95 %
RV % pred: 267 %
RV: 5.74 L
TLC % pred: 171 %
TLC: 8.28 L

## 2018-03-24 MED ORDER — ALBUTEROL SULFATE HFA 108 (90 BASE) MCG/ACT IN AERS: 2.0000 | INHALATION_SPRAY | Freq: Four times a day (QID) | RESPIRATORY_TRACT | 1 refills | Status: DC | PRN

## 2018-03-24 NOTE — Progress Notes (Signed)
PFT completed today. 03/24/18

## 2018-03-24 NOTE — Progress Notes (Signed)
pft  

## 2018-03-24 NOTE — Patient Instructions (Addendum)
We will order a follow-up PET scan in 6 months time for follow-up of the lung nodule We will stop the Spiriva and start you on anoro Continue to work on smoking cessation  Follow-up in 6 months after scan.

## 2018-03-24 NOTE — Progress Notes (Signed)
Pamela Russell    [id number]    Nov 04, 1945  Primary Care Physician:McMullen, Grayling Congress, DO  Referring Physician: [place] Bing, DO [place], [place] [address]  Chief complaint: Follow abnormal CT scan, emphysema  HPI: 72 year old with history of COPD, asthma, hypertension, active smoker She had a low-dose screening CT in November 2018 which showed right upper lobe nodules, groundglass opacities and is here for follow-up. She reports chronic dyspnea on exertion, denies any cough, mucus production, hemoptysis, fevers, chills. Maintain on Spiriva and albuterol PRN for many years.  Being followed in clinic for left lung 10 mm nodule.  Pets: None Occupation: Used to work for [employer].  Retired in the 1990s. Exposures: No known exposures, no mold, hot tub Smoking history: 60 pack year smoking hisotry. Continues to smoke 1/2 ppd Travel History: Born and raised in [place].  No significant travel  Interim history: She is had a follow-up CT of the chest last week and is here for review She is on Spiriva.  States that dyspnea on exertion is at baseline Trying to quit smoking and is on nicotine patches  Had a cardiac work-up including echo, stress test which are normal per cardiology review.  Outpatient Encounter Medications as of 03/24/2018  Medication Sig  . albuterol (PROVENTIL HFA;VENTOLIN HFA) 108 (90 BASE) MCG/ACT inhaler Inhale 2 puffs into the lungs every 6 (six) hours as needed for wheezing.  Marland Kitchen amLODipine (NORVASC) 5 MG tablet Take 1 tablet (5 mg total) by mouth daily.  Marland Kitchen aspirin 81 MG tablet Take 81 mg by mouth daily.  Marland Kitchen atorvastatin (LIPITOR) 40 MG tablet Take 1 tablet (40 mg total) by mouth daily.  Marland Kitchen glucosamine-chondroitin 500-400 MG tablet Take 1 tablet by mouth once.  Marland Kitchen guaifenesin (HUMIBID E) 400 MG TABS tablet Take 400 mg by mouth once.  . hydrochlorothiazide (HYDRODIURIL) 25 MG tablet Take 1 tablet (25 mg total) by mouth daily.   . mometasone (ELOCON) 0.1 % lotion Apply daily topically.  . Multiple Vitamin (MULTIVITAMIN) tablet Take 1 tablet by mouth daily.  . Nicotine 21-14-7 MG/24HR KIT Use as directed  . tiotropium (SPIRIVA) 18 MCG inhalation capsule Place 18 mcg into inhaler and inhale daily.   No facility-administered encounter medications on file as of 03/24/2018.     Allergies as of 03/24/2018 - Review Complete 03/24/2018  Allergen Reaction Noted  . Codeine Anaphylaxis and Swelling 04/27/2013  . Penicillins Anaphylaxis and Swelling 04/27/2013    Past Medical History:  Diagnosis Date  . Allergic rhinitis   . Asthma 2008  . Chronic obstructive pulmonary disease (COPD) ([facility]) 06/08/2016   Meds: Spiriva, albuterol inhaler PFT (11/29/2016): Moderate-severe obstruction by PFT 11/2016  . Ear itching 07/05/2017  . Grieving 06/08/2016  . Hyperlipidemia 09/10/2016   On atorvastatin 40 mg QD, ASA 81 mg QD ASCVD risk 17.1%, mod-high intensity statin Risk factors: FHx sig for MI: mother 73s-50s, and father age 32, HTN, smoker  . Hypertension   . Night sweats 06/08/2016  . Pulmonary nodules 06/12/2017  . Tobacco use disorder 07/06/2016   On nicotine replacement patch. Smoking cigs since age 55. Has 60 pack year history. Has COPD dx on SABA and LAMA.    Past Surgical History:  Procedure Laterality Date  . CATARACT EXTRACTION Left 05/08/2016  . CATARACT EXTRACTION Right 05/22/2016  . TOTAL ABDOMINAL HYSTERECTOMY W/ BILATERAL SALPINGOOPHORECTOMY Bilateral 1975    Family History  Problem Relation Age of Onset  .  Heart disease Mother        MI in her 25's  . Stroke Mother   . Heart disease Father        CABG  . Hypertension Father   . Heart disease Sister 27       CABG  . Stroke Sister   . Diabetes Sister     Social History   Socioeconomic History  . Marital status: Widowed    Spouse name: Not on file  . Number of children: Not on file  . Years of education: Not on file  . Highest education level:  Not on file  Occupational History  . Not on file  Social Needs  . Financial resource strain: Not on file  . Food insecurity:    Worry: Not on file    Inability: Not on file  . Transportation needs:    Medical: Not on file    Non-medical: Not on file  Tobacco Use  . Smoking status: Current Every Day Smoker    Packs/day: 0.50    Years: 60.00    Pack years: 30.00    Types: Cigarettes    Start date: 08/21/1955  . Smokeless tobacco: Never Used  . Tobacco comment: 3 ppd for 20+ years, has cut down to 0.5 ppd in the past 3 months  Substance and Sexual Activity  . Alcohol use: Yes    Alcohol/week: 0.6 oz    Types: 1 Cans of beer per week    Comment: occ  . Drug use: No  . Sexual activity: Never  Lifestyle  . Physical activity:    Days per week: Not on file    Minutes per session: Not on file  . Stress: Not on file  Relationships  . Social connections:    Talks on phone: Not on file    Gets together: Not on file    Attends religious service: Not on file    Active member of club or organization: Not on file    Attends meetings of clubs or organizations: Not on file    Relationship status: Not on file  . Intimate partner violence:    Fear of current or ex partner: Not on file    Emotionally abused: Not on file    Physically abused: Not on file    Forced sexual activity: Not on file  Other Topics Concern  . Not on file  Social History Narrative  . Not on file    Review of systems: Review of Systems  Constitutional: Negative for fever and chills.  HENT: Negative.   Eyes: Negative for blurred vision.  Respiratory: as per HPI  Cardiovascular: Negative for chest pain and palpitations.  Gastrointestinal: Negative for vomiting, diarrhea, blood per rectum. Genitourinary: Negative for dysuria, urgency, frequency and hematuria.  Musculoskeletal: Negative for myalgias, back pain and joint pain.  Skin: Negative for itching and rash.  Neurological: Negative for dizziness, tremors,  focal weakness, seizures and loss of consciousness.  Endo/Heme/Allergies: Negative for environmental allergies.  Psychiatric/Behavioral: Negative for depression, suicidal ideas and hallucinations.  All other systems reviewed and are negative.  Physical Exam: Blood pressure 130/68, pulse (!) 106, height 5' 4"  (1.626 m), weight 103 lb (46.7 kg), SpO2 97 %. Gen:      No acute distress HEENT:  EOMI, sclera anicteric Neck:     No masses; no thyromegaly Lungs:    Clear to auscultation bilaterally; normal respiratory effort CV:         Regular rate and rhythm;  no murmurs Abd:      + bowel sounds; soft, non-tender; no palpable masses, no distension Ext:    No edema; adequate peripheral perfusion Skin:      Warm and dry; no rash Neuro: alert and oriented x 3 Psych: normal mood and affect  Data Reviewed: FENO 10/15/17 < 5  CT scan 06/12/17-biapical scarring, mild emphysematous changes, right upper lobe scattered subcentimeter pulmonary nodules and groundglass opacities, mucoid impaction  CT scan 12/13/17-moderate emphysema, left upper lobe nodule has increased in size measuring about 10 to 11 mm.  13 mm right upper lobe nodule is decreased.  Another right upper lobe nodule slightly increased in size. Peripheral 2.7 mm nodule associated with cyst cystic changes in the right upper lobe.  Extensive coronary artery calcification. I have reviewed the images personally  Spirometry 11/29/16 FVC 2.72 [96%], FEV1 1.10 (49%], F/F 40 Severe obstruction   PFTs 03/24/2018 FVC 2.27 [81%], FEV1 1.19 [57%], F/F 52, TLC 171%, DLCO 61% Moderate obstruction with hyperinflation, air trapping.  Moderate diffusion defect  Echo 01/22/2018- LVEF 60-65%, elevated ventricular end-diastolic filling pressure.? 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  Assessment:  Lung nodule Follow-up CT reviewed.  The nodule has remained stable over the past 4 months Unclear if this is a malignancy as it appears to be at the  confluence of some blood vessels Estimated risk of malignancy by [facility] is 15-25%  We have discussed biopsy versus follow-up imaging and given the stability have decided to monitor this.  I will order a follow-up scan in 3 months time We will get a PET scan this time since the nodule is at the size limits of detection on PET  She may be a candidate for the new Biodesix blood test for evaluation of lung nodule.    Severe COPD PFTs reviewed with marked hyperinflation, air trapping Stop Spiriva.  Start anoro  Active smoker Discussed smoking cessation.  Continue nicotine patches  Plan/Recommendations: - PET scan in 3 months - Stop Spiriva, start Anoro - Smoking cessation, nicotine patches  Marshell Garfinkel MD [facility] 03/24/2018, 3:07 PM  CC: [place] Bing, DO

## 2018-03-26 ENCOUNTER — Telehealth: Payer: Self-pay

## 2018-03-26 NOTE — Telephone Encounter (Signed)
I called pt to advise her that we changed her PET scan appt to 3 months on 06/24/2018 at 10:00, arrive at 9:30. NPO after midnight, pt understood and nothing further is needed.

## 2018-03-28 ENCOUNTER — Ambulatory Visit: Payer: Medicare Other | Admitting: Family Medicine

## 2018-03-31 ENCOUNTER — Telehealth: Payer: Self-pay | Admitting: Pulmonary Disease

## 2018-03-31 MED ORDER — ALBUTEROL SULFATE HFA 108 (90 BASE) MCG/ACT IN AERS: 2.0000 | INHALATION_SPRAY | Freq: Four times a day (QID) | RESPIRATORY_TRACT | 1 refills | Status: DC | PRN

## 2018-03-31 NOTE — Telephone Encounter (Signed)
Received a fax from [facility] stating that Proventil and Ventolin would no longer be covered under her insurance. Covered alternative includes ProAir HFA or ProAir Respiclick.   Spoke with patient, she is ok with switching to ProAir. Advised her that it is the same medication, just in a different device. She verbalized understanding. RX will be sent to [facility].   Nothing further needed at time of call.

## 2018-04-01 ENCOUNTER — Telehealth: Payer: Self-pay | Admitting: Pulmonary Disease

## 2018-04-01 MED ORDER — UMECLIDINIUM-VILANTEROL 62.5-25 MCG/INH IN AEPB: 1.0000 | INHALATION_SPRAY | Freq: Every day | RESPIRATORY_TRACT | 0 refills | Status: DC

## 2018-04-01 NOTE — Telephone Encounter (Signed)
Called and spoke with pt who stated she was requesting a Rx of anoro to be sent to [facility].  Sent Rx to preferred pharmacy for pt. Nothing further needed.

## 2018-04-28 ENCOUNTER — Telehealth: Payer: Self-pay | Admitting: Pulmonary Disease

## 2018-04-28 NOTE — Telephone Encounter (Signed)
Reviewed Biodesix results which show low-moderate risk of malignancy (scanned into computer) Estimated 16% risk of malignancy, with negative predictive value of 90%.  Discussed results with patient We will continue as planned with follow-up scan  Marshell Garfinkel MD [facility] 04/28/2018, 9:47 AM

## 2018-05-26 ENCOUNTER — Other Ambulatory Visit: Payer: Self-pay | Admitting: Pulmonary Disease

## 2018-06-23 ENCOUNTER — Telehealth: Payer: Self-pay

## 2018-06-23 NOTE — Telephone Encounter (Signed)
Pt LVM on nurse line stating she needs a new handicap form filled out on her behalf. Pt is requesting the dating be more than 107mos. Pt stated its the same price for 15mos sticker as it is for 5 years. Pts current sticker expired on 10/31. Please advise.

## 2018-06-23 NOTE — Telephone Encounter (Signed)
Would be happy to if someone could leave a form in my physical box. I will get to it this week.  Harriet Butte, [place], PGY-3

## 2018-06-23 NOTE — Telephone Encounter (Signed)
Placed handicapped placard request in box for completion.  Pamela Russell, [place]

## 2018-06-24 ENCOUNTER — Encounter (HOSPITAL_COMMUNITY)
Admission: RE | Admit: 2018-06-24 | Discharge: 2018-06-24 | Disposition: A | Discharge status: Home / Self Care | Payer: Medicare Other | Source: Ambulatory Visit | Attending: Pulmonary Disease | Admitting: Pulmonary Disease

## 2018-06-24 DIAGNOSIS — R911 Solitary pulmonary nodule: Secondary | ICD-10-CM | POA: Insufficient documentation

## 2018-06-24 LAB — GLUCOSE, CAPILLARY: Glucose-Capillary: 111 mg/dL — ABNORMAL HIGH (ref 70–99)

## 2018-06-24 MED ORDER — FLUDEOXYGLUCOSE F - 18 (FDG) INJECTION
5.1900 | Freq: Once | INTRAVENOUS | Status: AC | PRN
  Administered 2018-06-24: 5.19 via INTRAVENOUS

## 2018-06-24 NOTE — Telephone Encounter (Signed)
Will complete 11/6 when I return to [facility]. Thanks.  Harriet Butte, [place], PGY-3'

## 2018-06-25 NOTE — Telephone Encounter (Signed)
Called and informed patient that Handicapped placard request paperwork is ready for pickup.  Pamela Russell, [place]

## 2018-06-25 NOTE — Telephone Encounter (Signed)
Form ready for pick-up. Please advise.  Harriet Butte, [place], PGY-3

## 2018-06-27 ENCOUNTER — Telehealth: Payer: Self-pay | Admitting: Pulmonary Disease

## 2018-06-27 DIAGNOSIS — R918 Other nonspecific abnormal finding of lung field: Secondary | ICD-10-CM

## 2018-06-27 NOTE — Telephone Encounter (Signed)
Super D without contrast ordered.  Nothing further.

## 2018-06-27 NOTE — Telephone Encounter (Signed)
CT super D with or without contrast?

## 2018-06-27 NOTE — Telephone Encounter (Signed)
Super D without contrast

## 2018-06-27 NOTE — Telephone Encounter (Signed)
I called and reviewed the results of the PET scan with Pamela Russell. She has 2 areas of concern on PET scan for lung malignancy and will need biopsy of both. She has been seen by cardiology earlier this year with negative stress test and cleared for GA.  I will discuss with my partners about how this can be arranged- Either same day for both navigational biopsies with radial EBUS or different procedures. We will need a repeat super D made.   She understands our discussion and we also talked about the risk benefit of the biopsy. She is willing to go ahead with the procedure.  Marshell Garfinkel MD [facility] 06/27/2018, 8:22 AM

## 2018-07-01 ENCOUNTER — Ambulatory Visit (INDEPENDENT_AMBULATORY_CARE_PROVIDER_SITE_OTHER)
Admission: RE | Admit: 2018-07-01 | Discharge: 2018-07-01 | Disposition: A | Discharge status: Home / Self Care | Payer: Medicare Other | Source: Ambulatory Visit | Attending: Pulmonary Disease | Admitting: Pulmonary Disease

## 2018-07-01 DIAGNOSIS — R918 Other nonspecific abnormal finding of lung field: Secondary | ICD-10-CM | POA: Diagnosis not present

## 2018-07-03 ENCOUNTER — Telehealth: Payer: Self-pay | Admitting: Pulmonary Disease

## 2018-07-03 ENCOUNTER — Other Ambulatory Visit: Payer: Self-pay | Admitting: Emergency Medicine

## 2018-07-03 DIAGNOSIS — R918 Other nonspecific abnormal finding of lung field: Secondary | ICD-10-CM

## 2018-07-03 NOTE — Telephone Encounter (Signed)
Called and spoke to patient, patient requesting results of CT.   PM please advise of CT results done 11.12.19. Thanks.

## 2018-07-03 NOTE — Telephone Encounter (Signed)
PCCM:  I could do the case next Friday afternoon 22nd if my remaining clinic is blocked and the 4pm patient is rescheduled?  Thanks  Garner Nash, DO [facility] 07/03/2018 7:47 AM

## 2018-07-03 NOTE — Telephone Encounter (Signed)
I spoke with Dr. Lamonte Sakai and he will arrange procedure for 11/20 [facility] so we can put on schedule. Thanks to all for your help.

## 2018-07-03 NOTE — Telephone Encounter (Signed)
PCCM:  Ok great thanks  Garner Nash, DO [facility] 07/03/2018 12:20 PM

## 2018-07-04 ENCOUNTER — Telehealth: Payer: Self-pay | Admitting: Pulmonary Disease

## 2018-07-04 NOTE — Telephone Encounter (Signed)
I called and updated Pamela Russell on the CT scan and next steps..  She is scheduled for lung biopsy on 11/20.  Will make a follow-up appointment in clinic to discuss results and plan for further steps after biopsy.

## 2018-07-04 NOTE — Telephone Encounter (Signed)
Called and spoke with Patient.  Explained office is currently closed and moving.  Patient stated understanding.  Instructed Patient to call Monday morning at the new office for samples.  Samples are currently boxed up.  New office number and directions given.

## 2018-07-04 NOTE — Telephone Encounter (Signed)
Pt has a f/u appt scheduled with Lazaro Arms, NP 07/14/18 to discuss biopsy results. Nothing further needed.

## 2018-07-04 NOTE — Telephone Encounter (Addendum)
Pt has been scheduled for OV 07/14/18 with TN at 2:30p to discuss bx results.  Pt aware of new office address and phone number. Okay with NP per Dr. Mannam//ms

## 2018-07-07 NOTE — Pre-Procedure Instructions (Signed)
Pamela Russell  07/07/2018      [id number] Pam[place] Russell, [place] AT [place] [address] Phone: [phone] Fax: [phone]  Pamela Russell, Pamela Russell Pamela Russell [address] [place] [address] [place] [address] Phone: [phone] Fax: [phone]    Your procedure is scheduled on Wednesday November 20th.  Report to [facility] Admitting at [place].M.  Call this number if you have problems the morning of surgery:  [phone]   Remember:  Do not eat or drink after midnight.    Take these medicines the morning of surgery with A SIP OF WATER   albuterol (PROAIR HFA) if needed. Bring Inhaler with you  amLODipine (NORVASC)   atorvastatin (LIPITOR)  cetirizine (ZYRTEC)   7 days prior to surgery STOP taking any Aspirin(unless otherwise instructed by your surgeon), Aleve, Naproxen, Ibuprofen, Motrin, Advil, Goody's, BC's, all herbal medications, fish oil, and all vitamins     Do not wear jewelry, make-up or nail polish.  Do not wear lotions, powders, or perfumes, or deodorant.  Do not shave 48 hours prior to surgery.  Men may shave face and neck.  Do not bring valuables to the hospital.  [facility] is not responsible for any belongings or valuables.  Contacts, dentures or bridgework may not be worn into surgery.  Leave your suitcase in the car.  After surgery it may be brought to your room.  For patients admitted to the hospital, discharge time will be determined by your treatment team.  Patients discharged the day of surgery will not be allowed to drive home.    [facility]- Preparing For Surgery  Before surgery, you can play an important role. Because skin is not sterile, your skin needs to be as free of germs as possible. You can reduce the number of germs on your skin by washing with CHG (chlorahexidine gluconate) Soap before  surgery.  CHG is an antiseptic cleaner which kills germs and bonds with the skin to continue killing germs even after washing.    Oral Hygiene is also important to reduce your risk of infection.  Remember - BRUSH YOUR TEETH THE MORNING OF SURGERY WITH YOUR REGULAR TOOTHPASTE  Please do not use if you have an allergy to CHG or antibacterial soaps. If your skin becomes reddened/irritated stop using the CHG.  Do not shave (including legs and underarms) for at least 48 hours prior to first CHG shower. It is OK to shave your face.  Please follow these instructions carefully.   1. Shower the NIGHT BEFORE SURGERY and the MORNING OF SURGERY with CHG.   2. If you chose to wash your hair, wash your hair first as usual with your normal shampoo.  3. After you shampoo, rinse your hair and body thoroughly to remove the shampoo.  4. Use CHG as you would any other liquid soap. You can apply CHG directly to the skin and wash gently with a scrungie or a clean washcloth.   5. Apply the CHG Soap to your body ONLY FROM THE NECK DOWN.  Do not use on open wounds or open sores. Avoid contact with your eyes, ears, mouth and genitals (private parts). Wash Face and genitals (private parts)  with your normal soap.  6. Wash thoroughly, paying special attention to the area where your surgery will be performed.  7. Thoroughly rinse  your body with warm water from the neck down.  8. DO NOT shower/wash with your normal soap after using and rinsing off the CHG Soap.  9. Pat yourself dry with a CLEAN TOWEL.  10. Wear CLEAN PAJAMAS to bed the night before surgery, wear comfortable clothes the morning of surgery  11. Place CLEAN SHEETS on your bed the night of your first shower and DO NOT SLEEP WITH PETS.    Day of Surgery:  Do not apply any deodorants/lotions.  Please wear clean clothes to the hospital/surgery [facility].   Remember to brush your teeth WITH YOUR REGULAR TOOTHPASTE.    Please read over the following  fact sheets that you were given.

## 2018-07-08 ENCOUNTER — Encounter (HOSPITAL_COMMUNITY): Payer: Self-pay

## 2018-07-08 ENCOUNTER — Encounter (HOSPITAL_COMMUNITY)
Admission: RE | Admit: 2018-07-08 | Discharge: 2018-07-08 | Disposition: A | Discharge status: Home / Self Care | Payer: Medicare Other | Source: Ambulatory Visit | Attending: Emergency Medicine | Admitting: Emergency Medicine

## 2018-07-08 ENCOUNTER — Telehealth: Payer: Self-pay | Admitting: Family Medicine

## 2018-07-08 ENCOUNTER — Other Ambulatory Visit: Payer: Self-pay

## 2018-07-08 DIAGNOSIS — Z01812 Encounter for preprocedural laboratory examination: Secondary | ICD-10-CM | POA: Insufficient documentation

## 2018-07-08 DIAGNOSIS — Z79899 Other long term (current) drug therapy: Secondary | ICD-10-CM | POA: Diagnosis not present

## 2018-07-08 DIAGNOSIS — C3411 Malignant neoplasm of upper lobe, right bronchus or lung: Secondary | ICD-10-CM | POA: Diagnosis not present

## 2018-07-08 DIAGNOSIS — E785 Hyperlipidemia, unspecified: Secondary | ICD-10-CM | POA: Diagnosis not present

## 2018-07-08 DIAGNOSIS — Z7982 Long term (current) use of aspirin: Secondary | ICD-10-CM | POA: Diagnosis not present

## 2018-07-08 DIAGNOSIS — I10 Essential (primary) hypertension: Secondary | ICD-10-CM | POA: Diagnosis not present

## 2018-07-08 DIAGNOSIS — J449 Chronic obstructive pulmonary disease, unspecified: Secondary | ICD-10-CM | POA: Diagnosis not present

## 2018-07-08 DIAGNOSIS — R918 Other nonspecific abnormal finding of lung field: Secondary | ICD-10-CM | POA: Diagnosis present

## 2018-07-08 DIAGNOSIS — J309 Allergic rhinitis, unspecified: Secondary | ICD-10-CM | POA: Diagnosis not present

## 2018-07-08 DIAGNOSIS — F17210 Nicotine dependence, cigarettes, uncomplicated: Secondary | ICD-10-CM | POA: Diagnosis not present

## 2018-07-08 HISTORY — DX: Personal history of pneumonia (recurrent): Z87.01

## 2018-07-08 LAB — COMPREHENSIVE METABOLIC PANEL
ALT: 22 U/L (ref 0–44)
AST: 19 U/L (ref 15–41)
Albumin: 4.1 g/dL (ref 3.5–5.0)
Alkaline Phosphatase: 58 U/L (ref 38–126)
Anion gap: 10 (ref 5–15)
BILIRUBIN TOTAL: 0.9 mg/dL (ref 0.3–1.2)
BUN: 7 mg/dL — AB (ref 8–23)
CO2: 26 mmol/L (ref 22–32)
Calcium: 9.4 mg/dL (ref 8.9–10.3)
Chloride: 98 mmol/L (ref 98–111)
Creatinine, Ser: 0.55 mg/dL (ref 0.44–1.00)
GFR calc Af Amer: >60 mL/min (ref >60)
GLUCOSE: 114 mg/dL — AB (ref 70–99)
Potassium: 3.5 mmol/L (ref 3.5–5.1)
Sodium: 134 mmol/L — ABNORMAL LOW (ref 135–145)
TOTAL PROTEIN: 7 g/dL (ref 6.5–8.1)

## 2018-07-08 LAB — CBC
HCT: 42 % (ref 36.0–46.0)
HEMOGLOBIN: 14 g/dL (ref 12.0–15.0)
MCH: 31 pg (ref 26.0–34.0)
MCHC: 33.3 g/dL (ref 30.0–36.0)
MCV: 93.1 fL (ref 80.0–100.0)
NRBC: 0 % (ref 0.0–0.2)
Platelets: 203 10*3/uL (ref 150–400)
RBC: 4.51 MIL/uL (ref 3.87–5.11)
RDW: 13.2 % (ref 11.5–15.5)
WBC: 8.2 10*3/uL (ref 4.0–10.5)

## 2018-07-08 LAB — PROTIME-INR
INR: 0.93
PROTHROMBIN TIME: 12.4 s (ref 11.4–15.2)

## 2018-07-08 LAB — APTT: aPTT: 26 seconds (ref 24–36)

## 2018-07-08 NOTE — Telephone Encounter (Signed)
Reviewed form for Jury duty and placed in PCP's box for completion.  Pamela Russell, [place]

## 2018-07-08 NOTE — Telephone Encounter (Signed)
Pt dropped forms to be filled for jury duty. Forms are in the red folder at the front desk. jw

## 2018-07-08 NOTE — Progress Notes (Signed)
PCP: Dr. Yisroel Ramming Cardiologist: Daune Perch, NP--clearance note 12/2017  EKG: 01/15/2018 ECHO: 2019 Stress Test: 2019 Cardiac Cath: Denies  Patient denies fever, and chest pain at PAT appointment.  Reports exertional SOB and cough.   Patient verbalized understanding of instructions provided today at the PAT appointment.  Patient asked to review instructions at home and day of surgery.

## 2018-07-09 ENCOUNTER — Encounter (HOSPITAL_COMMUNITY)
Admission: RE | Disposition: A | Discharge status: Home / Self Care | Payer: Self-pay | Source: Ambulatory Visit | Attending: Emergency Medicine

## 2018-07-09 ENCOUNTER — Encounter (HOSPITAL_COMMUNITY): Payer: Self-pay

## 2018-07-09 ENCOUNTER — Ambulatory Visit (HOSPITAL_COMMUNITY): Payer: Medicare Other

## 2018-07-09 ENCOUNTER — Ambulatory Visit (HOSPITAL_COMMUNITY): Payer: Medicare Other | Admitting: Certified Registered"

## 2018-07-09 ENCOUNTER — Ambulatory Visit (HOSPITAL_COMMUNITY)
Admission: RE | Admit: 2018-07-09 | Discharge: 2018-07-09 | Disposition: A | Discharge status: Home / Self Care | Payer: Medicare Other | Source: Ambulatory Visit | Attending: Emergency Medicine | Admitting: Emergency Medicine

## 2018-07-09 DIAGNOSIS — I10 Essential (primary) hypertension: Secondary | ICD-10-CM | POA: Diagnosis not present

## 2018-07-09 DIAGNOSIS — C3411 Malignant neoplasm of upper lobe, right bronchus or lung: Secondary | ICD-10-CM | POA: Insufficient documentation

## 2018-07-09 DIAGNOSIS — L299 Pruritus, unspecified: Secondary | ICD-10-CM

## 2018-07-09 DIAGNOSIS — Z9889 Other specified postprocedural states: Secondary | ICD-10-CM

## 2018-07-09 DIAGNOSIS — E785 Hyperlipidemia, unspecified: Secondary | ICD-10-CM | POA: Insufficient documentation

## 2018-07-09 DIAGNOSIS — Z7982 Long term (current) use of aspirin: Secondary | ICD-10-CM | POA: Insufficient documentation

## 2018-07-09 DIAGNOSIS — F17210 Nicotine dependence, cigarettes, uncomplicated: Secondary | ICD-10-CM | POA: Insufficient documentation

## 2018-07-09 DIAGNOSIS — J309 Allergic rhinitis, unspecified: Secondary | ICD-10-CM | POA: Diagnosis not present

## 2018-07-09 DIAGNOSIS — R918 Other nonspecific abnormal finding of lung field: Secondary | ICD-10-CM

## 2018-07-09 DIAGNOSIS — J449 Chronic obstructive pulmonary disease, unspecified: Secondary | ICD-10-CM | POA: Diagnosis not present

## 2018-07-09 DIAGNOSIS — Z79899 Other long term (current) drug therapy: Secondary | ICD-10-CM | POA: Insufficient documentation

## 2018-07-09 HISTORY — PX: VIDEO BRONCHOSCOPY WITH ENDOBRONCHIAL NAVIGATION: SHX6175

## 2018-07-09 SURGERY — VIDEO BRONCHOSCOPY WITH ENDOBRONCHIAL NAVIGATION
Anesthesia: General | Site: Bronchus | Laterality: Left

## 2018-07-09 MED ORDER — MOMETASONE FUROATE 0.1 % EX SOLN: 1.0000 "application " | Freq: Every day | CUTANEOUS | Status: DC

## 2018-07-09 MED ORDER — ONDANSETRON HCL 4 MG/2ML IJ SOLN
INTRAMUSCULAR | Status: AC
  Filled 2018-07-09: qty 2

## 2018-07-09 MED ORDER — FENTANYL CITRATE (PF) 100 MCG/2ML IJ SOLN
INTRAMUSCULAR | Status: DC | PRN
  Administered 2018-07-09: 25 ug via INTRAVENOUS
  Administered 2018-07-09: 100 ug via INTRAVENOUS
  Administered 2018-07-09: 25 ug via INTRAVENOUS

## 2018-07-09 MED ORDER — PROPOFOL 10 MG/ML IV BOLUS
INTRAVENOUS | Status: DC | PRN
  Administered 2018-07-09: 70 mg via INTRAVENOUS

## 2018-07-09 MED ORDER — ROCURONIUM BROMIDE 50 MG/5ML IV SOSY
PREFILLED_SYRINGE | INTRAVENOUS | Status: AC
  Filled 2018-07-09: qty 5

## 2018-07-09 MED ORDER — DEXAMETHASONE SODIUM PHOSPHATE 10 MG/ML IJ SOLN
INTRAMUSCULAR | Status: DC | PRN
  Administered 2018-07-09: 10 mg via INTRAVENOUS

## 2018-07-09 MED ORDER — ONDANSETRON HCL 4 MG/2ML IJ SOLN
INTRAMUSCULAR | Status: DC | PRN
  Administered 2018-07-09: 4 mg via INTRAVENOUS

## 2018-07-09 MED ORDER — MIDAZOLAM HCL 2 MG/2ML IJ SOLN
INTRAMUSCULAR | Status: DC | PRN
  Administered 2018-07-09 (×2): 1 mg via INTRAVENOUS

## 2018-07-09 MED ORDER — 0.9 % SODIUM CHLORIDE (POUR BTL) OPTIME
TOPICAL | Status: DC | PRN
  Administered 2018-07-09: 1000 mL

## 2018-07-09 MED ORDER — LACTATED RINGERS IV SOLN
INTRAVENOUS | Status: DC | PRN
  Administered 2018-07-09: 08:00:00 via INTRAVENOUS

## 2018-07-09 MED ORDER — LIDOCAINE 2% (20 MG/ML) 5 ML SYRINGE
INTRAMUSCULAR | Status: AC
  Filled 2018-07-09: qty 5

## 2018-07-09 MED ORDER — ROCURONIUM BROMIDE 50 MG/5ML IV SOSY
PREFILLED_SYRINGE | INTRAVENOUS | Status: DC | PRN
  Administered 2018-07-09: 10 mg via INTRAVENOUS
  Administered 2018-07-09: 40 mg via INTRAVENOUS
  Administered 2018-07-09: 20 mg via INTRAVENOUS

## 2018-07-09 MED ORDER — UMECLIDINIUM-VILANTEROL 62.5-25 MCG/INH IN AEPB: 1.0000 | INHALATION_SPRAY | Freq: Every day | RESPIRATORY_TRACT | Status: DC

## 2018-07-09 MED ORDER — PHENYLEPHRINE 40 MCG/ML (10ML) SYRINGE FOR IV PUSH (FOR BLOOD PRESSURE SUPPORT)
PREFILLED_SYRINGE | INTRAVENOUS | Status: AC
  Filled 2018-07-09: qty 10

## 2018-07-09 MED ORDER — DEXAMETHASONE SODIUM PHOSPHATE 10 MG/ML IJ SOLN
INTRAMUSCULAR | Status: AC
  Filled 2018-07-09: qty 1

## 2018-07-09 MED ORDER — PROPOFOL 10 MG/ML IV BOLUS
INTRAVENOUS | Status: AC
  Filled 2018-07-09: qty 20

## 2018-07-09 MED ORDER — LIDOCAINE 2% (20 MG/ML) 5 ML SYRINGE
INTRAMUSCULAR | Status: DC | PRN
  Administered 2018-07-09: 40 mg via INTRAVENOUS

## 2018-07-09 MED ORDER — SODIUM CHLORIDE 0.9 % IV SOLN
INTRAVENOUS | Status: DC | PRN
  Administered 2018-07-09: 25 ug/min via INTRAVENOUS

## 2018-07-09 MED ORDER — FENTANYL CITRATE (PF) 250 MCG/5ML IJ SOLN
INTRAMUSCULAR | Status: AC
  Filled 2018-07-09: qty 5

## 2018-07-09 MED ORDER — PHENYLEPHRINE 40 MCG/ML (10ML) SYRINGE FOR IV PUSH (FOR BLOOD PRESSURE SUPPORT)
PREFILLED_SYRINGE | INTRAVENOUS | Status: DC | PRN
  Administered 2018-07-09 (×2): 80 ug via INTRAVENOUS

## 2018-07-09 MED ORDER — SUGAMMADEX SODIUM 200 MG/2ML IV SOLN
INTRAVENOUS | Status: DC | PRN
  Administered 2018-07-09: 100 mg via INTRAVENOUS

## 2018-07-09 MED ORDER — SUGAMMADEX SODIUM 200 MG/2ML IV SOLN
INTRAVENOUS | Status: AC
  Filled 2018-07-09: qty 2

## 2018-07-09 MED ORDER — MIDAZOLAM HCL 2 MG/2ML IJ SOLN
INTRAMUSCULAR | Status: AC
  Filled 2018-07-09: qty 2

## 2018-07-09 SURGICAL SUPPLY — 45 items
ADAPTER BRONCHOSCOPE OLYMPUS (ADAPTER) ×2 IMPLANT
ADAPTER VALVE BIOPSY EBUS (MISCELLANEOUS) IMPLANT
ADPR BSCP OLMPS EDG (ADAPTER) ×1
ADPTR VALVE BIOPSY EBUS (MISCELLANEOUS)
BRUSH BIOPSY BRONCH 10 SDTNB (MISCELLANEOUS) ×1 IMPLANT
BRUSH CYTOL CELLEBRITY 1.5X140 (MISCELLANEOUS) ×2 IMPLANT
BRUSH SUPERTRAX BIOPSY (INSTRUMENTS) IMPLANT
BRUSH SUPERTRAX NDL-TIP CYTO (INSTRUMENTS) ×3 IMPLANT
CANISTER SUCT 3000ML PPV (MISCELLANEOUS) ×2 IMPLANT
CHANNEL WORK EXTEND EDGE 180 (KITS) IMPLANT
CHANNEL WORK EXTEND EDGE 90 (KITS) IMPLANT
CONT SPEC 4OZ CLIKSEAL STRL BL (MISCELLANEOUS) ×2 IMPLANT
COVER BACK TABLE 60X90IN (DRAPES) ×2 IMPLANT
COVER WAND RF STERILE (DRAPES) ×2 IMPLANT
FILTER STRAW FLUID ASPIR (MISCELLANEOUS) IMPLANT
FORCEPS BIOP SUPERTRX PREMAR (INSTRUMENTS) ×2 IMPLANT
GAUZE SPONGE 4X4 12PLY STRL (GAUZE/BANDAGES/DRESSINGS) ×2 IMPLANT
GLOVE BIO SURGEON STRL SZ7.5 (GLOVE) ×4 IMPLANT
GOWN STRL REUS W/ TWL LRG LVL3 (GOWN DISPOSABLE) ×2 IMPLANT
GOWN STRL REUS W/TWL LRG LVL3 (GOWN DISPOSABLE) ×4
KIT CLEAN ENDO COMPLIANCE (KITS) ×2 IMPLANT
KIT ENDOBRONCHIAL EDGE FIRM (KITS) ×1 IMPLANT
KIT LOCATABLE GUIDE (CANNULA) IMPLANT
KIT MARKER FIDUCIAL DELIVERY (KITS) IMPLANT
KIT PROCEDURE EDGE 180 (KITS) IMPLANT
KIT PROCEDURE EDGE 90 (KITS) IMPLANT
KIT TURNOVER KIT B (KITS) ×2 IMPLANT
MARKER SKIN DUAL TIP RULER LAB (MISCELLANEOUS) ×2 IMPLANT
NDL SUPERTRX PREMARK BIOPSY (NEEDLE) ×1 IMPLANT
NEEDLE SUPERTRX PREMARK BIOPSY (NEEDLE) ×2 IMPLANT
NS IRRIG 1000ML POUR BTL (IV SOLUTION) ×2 IMPLANT
OIL SILICONE PENTAX (PARTS (SERVICE/REPAIRS)) ×2 IMPLANT
PAD ARMBOARD 7.5X6 YLW CONV (MISCELLANEOUS) ×4 IMPLANT
PATCHES PATIENT (LABEL) ×6 IMPLANT
SYR 20CC LL (SYRINGE) ×2 IMPLANT
SYR 20ML ECCENTRIC (SYRINGE) ×2 IMPLANT
SYR 50ML SLIP (SYRINGE) ×2 IMPLANT
TOWEL OR 17X24 6PK STRL BLUE (TOWEL DISPOSABLE) ×2 IMPLANT
TRAP SPECIMEN MUCOUS 40CC (MISCELLANEOUS) IMPLANT
TUBE CONNECTING 20X1/4 (TUBING) ×2 IMPLANT
UNDERPAD 30X30 (UNDERPADS AND DIAPERS) ×2 IMPLANT
VALVE BIOPSY  SINGLE USE (MISCELLANEOUS) ×1
VALVE BIOPSY SINGLE USE (MISCELLANEOUS) ×1 IMPLANT
VALVE SUCTION BRONCHIO DISP (MISCELLANEOUS) ×2 IMPLANT
WATER STERILE IRR 1000ML POUR (IV SOLUTION) ×2 IMPLANT

## 2018-07-09 NOTE — Op Note (Signed)
Video Bronchoscopy with Electromagnetic Navigation Procedure Note  Date of Operation: 07/09/2018  Pre-op Diagnosis: Bilateral pulmonary nodules  Post-op Diagnosis: Same  Surgeon: Baltazar Apo  Assistants: None  Anesthesia: General endotracheal anesthesia  Operation: Flexible video fiberoptic bronchoscopy with electromagnetic navigation and biopsies.  Estimated Blood Loss: Minimal  Complications: None Apparent  Indications and History: Pamela Russell is a 72 y.o. female with history of tobacco use.  She is been followed in our office by Dr. Vaughan Browner for pulmonary nodules.  Recommendation was made to achieve a tissue diagnosis via navigational bronchoscopy.  The risks, benefits, complications, treatment options and expected outcomes were discussed with the patient.  The possibilities of pneumothorax, pneumonia, reaction to medication, pulmonary aspiration, perforation of a viscus, bleeding, failure to diagnose a condition and creating a complication requiring transfusion or operation were discussed with the patient who freely signed the consent.    Description of Procedure: The patient was seen in the Preoperative Area, was examined and was deemed appropriate to proceed.  The patient was taken to OR10, identified as Pamela Russell and the procedure verified as Flexible Video Fiberoptic Bronchoscopy.  A Time Out was held and the above information confirmed.   Prior to the date of the procedure a high-resolution CT scan of the chest was performed. Utilizing [place] a virtual tracheobronchial tree was generated to allow the creation of distinct navigation pathways to the patient's parenchymal abnormalities. After being taken to the operating room general anesthesia was initiated and the patient  was orally intubated. The video fiberoptic bronchoscope was introduced via the endotracheal tube and a general inspection was performed which showed normal airways throughout.  There  was some mild cobblestoning change in the right lower lobe airways but no discrete endobronchial lesion. The extendable working channel and locator guide were introduced into the bronchoscope. The distinct navigation pathways prepared prior to this procedure were then utilized to navigate to within 0.5 to 0.9 cm of patient's lesions identified on CT scan as below:  Target 1: Left upper lobe solid nodule Target 2: Right upper lobe more posterior nodule Target 3: Right upper lobe more anterior/superior nodule  The extendable working channel was secured into place at each location and the locator guide was withdrawn.  Approaching the left upper lobe nodule was difficult because it was located at an airway bifurcation.  Navigation to targets 2 and 3 was more straightforward.   Under fluoroscopic guidance transbronchial needle brushings were obtained at each target location, transbronchial Wang needle biopsies were taken from targets 1 and 2, and transbronchial forceps biopsies were performed at targets 1 and 2 to be sent for cytology and pathology. Bronchioalveolar lavage was performed at 2 locations in the right upper lobe in the vicinity of targets 2 and 3 and sent for cytology and microbiology (bacterial, fungal, AFB smears and cultures). At the end of the procedure a general airway inspection was performed and there was no evidence of active bleeding. The bronchoscope was removed.  The patient tolerated the procedure well. There was no significant blood loss and there were no obvious complications. A post-procedural chest x-ray is pending.  Samples: 1. Transbronchial needle brushings from left upper lobe nodule, 2 discrete right upper lobe nodules 2. Transbronchial Wang needle biopsies from left upper lobe nodule, right upper lobe nodule (target 2) 3. Transbronchial forceps biopsies from left upper lobe nodule, right upper lobe nodule (target 2) 4. Bronchoalveolar lavage from 2 locations in the right  upper lobe adjacent to targets  2 and 3   Plans:  The patient will be discharged from the PACU to home when recovered from anesthesia and after chest x-ray is reviewed. We will review the cytology, pathology and microbiology results with the patient when they become available. Outpatient followup will be with Lazaro Arms, Dr Kelby Fam, MD, PhD 07/09/2018, 11:15 AM [facility] [phone] or if no answer [phone]

## 2018-07-09 NOTE — Discharge Instructions (Signed)
Flexible Bronchoscopy, Care After These instructions give you information on caring for yourself after your procedure. Your doctor may also give you more specific instructions. Call your doctor if you have any problems or questions after your procedure. Follow these instructions at home:  Do not eat or drink anything for 2 hours after your procedure. If you try to eat or drink before the medicine wears off, food or drink could go into your lungs. You could also burn yourself.  After 2 hours have passed and when you can cough and gag normally, you may eat soft food and drink liquids slowly.  The day after the test, you may eat your normal diet.  You may do your normal activities.  Keep all doctor visits. Get help right away if:  You get more and more short of breath.  You get light-headed.  You feel like you are going to pass out (faint).  You have chest pain.  You have new problems that worry you.  You cough up more than a little blood.  You cough up more blood than before.  Please call our office for any questions or concerns.  [phone].  This information is not intended to replace advice given to you by your health care provider. Make sure you discuss any questions you have with your health care provider. Document Released: 06/03/2009 Document Revised: 01/12/2016 Document Reviewed: 04/10/2013 Elsevier Interactive Patient Education  2017 [facility] Instructions  Activity: Get plenty of rest for the remainder of the day. A responsible individual must stay with you for 24 hours following the procedure.  For the next 24 hours, DO NOT: -Drive a car -Paediatric nurse -Drink alcoholic beverages -Take any medication unless instructed by your physician -Make any legal decisions or sign important papers.  Meals: Start with liquid foods such as gelatin or soup. Progress to regular foods as tolerated. Avoid greasy, spicy, heavy foods. If  nausea and/or vomiting occur, drink only clear liquids until the nausea and/or vomiting subsides. Call your physician if vomiting continues.  Special Instructions/Symptoms: Your throat may feel dry or sore from the anesthesia or the breathing tube placed in your throat during surgery. If this causes discomfort, gargle with warm salt water. The discomfort should disappear within 24 hours.  If you had a scopolamine patch placed behind your ear for the management of post- operative nausea and/or vomiting:  1. The medication in the patch is effective for 72 hours, after which it should be removed.  Wrap patch in a tissue and discard in the trash. Wash hands thoroughly with soap and water. 2. You may remove the patch earlier than 72 hours if you experience unpleasant side effects which may include dry mouth, dizziness or visual disturbances. 3. Avoid touching the patch. Wash your hands with soap and water after contact with the patch.

## 2018-07-09 NOTE — Transfer of Care (Signed)
Immediate Anesthesia Transfer of Care Note  Patient: Pamela Russell  Procedure(s) Performed: VIDEO BRONCHOSCOPY WITH ENDOBRONCHIAL NAVIGATION (Left Bronchus)  Patient Location: PACU  Anesthesia Type:General  Level of Consciousness: drowsy and patient cooperative  Airway & Oxygen Therapy: Patient Spontanous Breathing  Post-op Assessment: Report given to RN  Post vital signs: Reviewed and stable  Last Vitals:  Vitals Value Taken Time  BP 125/80 07/09/2018 10:46 AM  Temp    Pulse 92 07/09/2018 10:47 AM  Resp 16 07/09/2018 10:47 AM  SpO2 93 % 07/09/2018 10:47 AM  Vitals shown include unvalidated device data.  Last Pain:  Vitals:   07/09/18 0649  TempSrc:   PainSc: 0-No pain         Complications: No apparent anesthesia complications

## 2018-07-09 NOTE — Telephone Encounter (Signed)
Form needs a letter from provider.  I have highlighted the info portion.  No letter in chart.  Will forward back to MD and place form in box. Chalmer Zheng, Salome Spotted, CMA

## 2018-07-09 NOTE — H&P (Signed)
Pamela Russell is an 72 y.o. female.   Chief Complaint: Pulmonary nodules HPI: 72 year old active smoker with a history of COPD with asthmatic features, hypertension, being followed by Dr. Vaughan Browner in our office for bilateral pulmonary nodules.  She has 2 hazy poorly formed right upper lobe nodules with some adjacent area cavitation.  There is also a more central solid left upper lobe nodule.  These were followed for interval change by PET scan on 06/24/2018.  There was some minimal increase in wall thickening in the more posterior right upper lobe nodule with hypermetabolism present.  The more solid left upper lobe nodule was also hypermetabolic.  Both are concerning for possible malignancy.  Recommendation was made to achieve a tissue diagnosis via navigational bronchoscopy.    Past Medical History:  Diagnosis Date  . Allergic rhinitis   . Asthma 2008  . Chronic obstructive pulmonary disease (COPD) ([place]) 06/08/2016   Meds: Spiriva, albuterol inhaler PFT (11/29/2016): Moderate-severe obstruction by PFT 11/2016  . Ear itching 07/05/2017  . Grieving 06/08/2016  . History of pneumonia    x4  . Hyperlipidemia 09/10/2016   On atorvastatin 40 mg QD, ASA 81 mg QD ASCVD risk 17.1%, mod-high intensity statin Risk factors: FHx sig for MI: mother 15s-50s, and father age 45, HTN, smoker  . Hypertension   . Night sweats 06/08/2016  . Pulmonary nodules 06/12/2017  . Tobacco use disorder 07/06/2016   On nicotine replacement patch. Smoking cigs since age 48. Has 60 pack year history. Has COPD dx on SABA and LAMA.    Past Surgical History:  Procedure Laterality Date  . CATARACT EXTRACTION Left 05/08/2016  . CATARACT EXTRACTION Right 05/22/2016  . TOTAL ABDOMINAL HYSTERECTOMY W/ BILATERAL SALPINGOOPHORECTOMY Bilateral 1975    Family History  Problem Relation Age of Onset  . Heart disease Mother        MI in her 62's  . Stroke Mother   . Heart disease Father        CABG  . Hypertension Father   .  Heart disease Sister 70       CABG  . Stroke Sister   . Diabetes Sister    Social History:  reports that she has been smoking cigarettes. She started smoking about 62 years ago. She has a 30.00 pack-year smoking history. She has never used smokeless tobacco. She reports that she drinks about 1.0 standard drinks of alcohol per week. She reports that she does not use drugs.  Allergies:  Allergies  Allergen Reactions  . Codeine Anaphylaxis and Swelling  . Penicillins Anaphylaxis and Swelling    Childhood reaction. Has patient had a PCN reaction causing immediate rash, facial/tongue/throat swelling, SOB or lightheadedness with hypotension: Yes Has patient had a PCN reaction causing severe rash involving mucus membranes or skin necrosis: No Has patient had a PCN reaction that required hospitalization: No Has patient had a PCN reaction occurring within the last 10 years: No If all of the above answers are "NO", then may proceed with Cephalosporin use.     Medications Prior to Admission  Medication Sig Dispense Refill  . albuterol (PROAIR HFA) 108 (90 Base) MCG/ACT inhaler Inhale 2 puffs into the lungs every 6 (six) hours as needed for wheezing or shortness of breath. 3 Inhaler 1  . amLODipine (NORVASC) 5 MG tablet Take 1 tablet (5 mg total) by mouth daily. 90 tablet 3  . ANORO ELLIPTA 62.5-25 MCG/INH AEPB USE 1 INHALATION BY MOUTH  DAILY (Patient taking differently: Inhale 1  puff into the lungs daily. ) 180 each 6  . aspirin EC 81 MG tablet Take 81 mg by mouth daily.    Marland Kitchen atorvastatin (LIPITOR) 40 MG tablet Take 1 tablet (40 mg total) by mouth daily. 90 tablet 3  . cetirizine (ZYRTEC) 10 MG tablet Take 10 mg by mouth daily.    Marland Kitchen glucosamine-chondroitin 500-400 MG tablet Take 1 tablet by mouth daily.     Marland Kitchen guaifenesin (HUMIBID E) 400 MG TABS tablet Take 400 mg by mouth daily.     . hydrochlorothiazide (HYDRODIURIL) 25 MG tablet Take 1 tablet (25 mg total) by mouth daily. 90 tablet 3  .  Multiple Vitamin (MULTIVITAMIN WITH MINERALS) TABS tablet Take 1 tablet by mouth daily.    . mometasone (ELOCON) 0.1 % lotion Apply daily topically. (Patient taking differently: Apply 1 application topically daily. Applied to ears to prevent itching.) 30 mL 0    Results for orders placed or performed during the hospital encounter of 07/08/18 (from the past 48 hour(s))  APTT     Status: None   Collection Time: 07/08/18 11:35 AM  Result Value Ref Range   aPTT 26 24 - 36 seconds    Comment: Performed at [facility], [place] [address], [place] [address]  CBC     Status: None   Collection Time: 07/08/18 11:35 AM  Result Value Ref Range   WBC 8.2 4.0 - 10.5 K/uL   RBC 4.51 3.87 - 5.11 MIL/uL   Hemoglobin 14.0 12.0 - 15.0 g/dL   HCT 42.0 36.0 - 46.0 %   MCV 93.1 80.0 - 100.0 fL   MCH 31.0 26.0 - 34.0 pg   MCHC 33.3 30.0 - 36.0 g/dL   RDW 13.2 11.5 - 15.5 %   Platelets 203 150 - 400 K/uL   nRBC 0.0 0.0 - 0.2 %    Comment: Performed at [facility], [place] [address], [place], [place] [address]  Comprehensive metabolic panel     Status: Abnormal   Collection Time: 07/08/18 11:35 AM  Result Value Ref Range   Sodium 134 (L) 135 - 145 mmol/L   Potassium 3.5 3.5 - 5.1 mmol/L   Chloride 98 98 - 111 mmol/L   CO2 26 22 - 32 mmol/L   Glucose, Bld 114 (H) 70 - 99 mg/dL   BUN 7 (L) 8 - 23 mg/dL   Creatinine, Ser 0.55 0.44 - 1.00 mg/dL   Calcium 9.4 8.9 - 10.3 mg/dL   Total Protein 7.0 6.5 - 8.1 g/dL   Albumin 4.1 3.5 - 5.0 g/dL   AST 19 15 - 41 U/L   ALT 22 0 - 44 U/L   Alkaline Phosphatase 58 38 - 126 U/L   Total Bilirubin 0.9 0.3 - 1.2 mg/dL   GFR calc non Af Amer >60 >60 mL/min   GFR calc Af Amer >60 >60 mL/min    Comment: (NOTE) The eGFR has been calculated using the CKD EPI equation. This calculation has not been validated in all clinical situations. eGFR's persistently <60 mL/min signify possible Chronic Kidney Disease.    Anion gap 10 5 - 15    Comment: Performed  at [facility] [address] [place],  [address]  Protime-INR     Status: None   Collection Time: 07/08/18 11:35 AM  Result Value Ref Range   Prothrombin Time 12.4 11.4 - 15.2 seconds   INR 0.93     Comment: Performed at [facility]  [facility], [place] [address], [place], [place] [address]   No results found.  ROS Patient denies any significant changes in her breathing.  She has occasional cough.  No chest pain.  Blood pressure (!) 169/85, pulse 92, temperature 97.7 F (36.5 C), temperature source Oral, resp. rate 18, height 5' 4"  (1.626 m), weight 47.6 kg, SpO2 98 %. Physical Exam  Gen: Pleasant, thin woman, in no distress,  normal affect  ENT: No lesions,  mouth clear,  oropharynx clear, no postnasal drip, M3 airway  Neck: No JVD, no stridor  Lungs: No use of accessory muscles, distant without any wheezing or crackles  Cardiovascular: RRR, heart sounds normal, no murmur or gallops, no peripheral edema  Abdomen: Not examined  Musculoskeletal: No deformities, no cyanosis or clubbing  Neuro: alert, non focal  Skin: Warm, no lesions or rash   Assessment/Plan Bilateral pulmonary nodules.  The right sided nodules are more cavitary with thickened components, this compared with the left upper lobe nodule which is completely solid.  There is hypermetabolism present and the pretest suspicion for malignancy is moderate to high.  We will plan for navigational bronchoscopy, attempt to approach the solid left sided nodule first and then turned attention to the hypermetabolic right upper lobe cavitary lesion as long as there are no apparent complications after left-sided biopsies.  Discussed the case in detail with the patient.  All questions answered.  She understands the risk and benefits and elects to proceed.  No barriers identified.  Collene Gobble, MD 07/09/2018, 8:26 AM

## 2018-07-09 NOTE — Anesthesia Procedure Notes (Signed)
Procedure Name: Intubation Date/Time: 07/09/2018 8:35 AM Performed by: Barrington Ellison, CRNA Pre-anesthesia Checklist: Patient identified, Emergency Drugs available, Suction available and Patient being monitored Patient Re-evaluated:Patient Re-evaluated prior to induction Oxygen Delivery Method: Circle System Utilized Preoxygenation: Pre-oxygenation with 100% oxygen Induction Type: IV induction Ventilation: Mask ventilation without difficulty Laryngoscope Size: Mac and 3 Grade View: Grade I Tube type: Oral Tube size: 8.5 mm Number of attempts: 1 Airway Equipment and Method: Stylet and Oral airway Placement Confirmation: ETT inserted through vocal cords under direct vision,  positive ETCO2 and breath sounds checked- equal and bilateral Secured at: 21 cm Tube secured with: Tape Dental Injury: Teeth and Oropharynx as per pre-operative assessment

## 2018-07-09 NOTE — Telephone Encounter (Signed)
Form completed and left in RN box.  Harriet Butte, [place], PGY-3

## 2018-07-09 NOTE — Anesthesia Preprocedure Evaluation (Addendum)
Anesthesia Evaluation  Patient identified by MRN, date of birth, ID band Patient awake    Reviewed: Allergy & Precautions, NPO status , Patient's Chart, lab work & pertinent test results  History of Anesthesia Complications Negative for: history of anesthetic complications  Airway Mallampati: II  TM Distance: >3 FB Neck ROM: Full    Dental  (+) Teeth Intact, Missing, Poor Dentition, Dental Advisory Given   Pulmonary asthma , COPD,  COPD inhaler, Current Smoker,     + decreased breath sounds      Cardiovascular hypertension, Pt. on medications (-) angina(-) Past MI and (-) CHF  Rhythm:Regular     Neuro/Psych negative neurological ROS  negative psych ROS   GI/Hepatic negative GI ROS, Neg liver ROS,   Endo/Other  negative endocrine ROS  Renal/GU negative Renal ROS     Musculoskeletal   Abdominal   Peds  Hematology negative hematology ROS (+)   Anesthesia Other Findings 2018 stress:  Nuclear stress EF: 77%.  There was no ST segment deviation noted during stress.  The study is normal.  The left ventricular ejection fraction is hyperdynamic (>65%).   1. EF 77%, normal wall motion.  2. No evidence for ischemia or infarction on perfusion images.   Reproductive/Obstetrics                            Anesthesia Physical Anesthesia Plan  ASA: III  Anesthesia Plan: General   Post-op Pain Management:    Induction: Intravenous  PONV Risk Score and Plan: 2 and Ondansetron and Dexamethasone  Airway Management Planned: Oral ETT  Additional Equipment: None  Intra-op Plan:   Post-operative Plan: Extubation in OR  Informed Consent: I have reviewed the patients History and Physical, chart, labs and discussed the procedure including the risks, benefits and alternatives for the proposed anesthesia with the patient or authorized representative who has indicated his/her understanding and  acceptance.   Dental advisory given  Plan Discussed with: Surgeon and CRNA  Anesthesia Plan Comments:         Anesthesia Quick Evaluation

## 2018-07-10 ENCOUNTER — Encounter (HOSPITAL_COMMUNITY): Payer: Self-pay | Admitting: Emergency Medicine

## 2018-07-10 LAB — ACID FAST SMEAR (AFB, MYCOBACTERIA)
Acid Fast Smear: NEGATIVE
Acid Fast Smear: NEGATIVE

## 2018-07-10 LAB — ACID FAST SMEAR (AFB)

## 2018-07-10 NOTE — Anesthesia Postprocedure Evaluation (Signed)
Anesthesia Post Note  Patient: Pamela Russell  Procedure(s) Performed: VIDEO BRONCHOSCOPY WITH ENDOBRONCHIAL NAVIGATION (Left Bronchus)     Patient location during evaluation: PACU Anesthesia Type: General Level of consciousness: awake and alert Pain management: pain level controlled Vital Signs Assessment: post-procedure vital signs reviewed and stable Respiratory status: spontaneous breathing, nonlabored ventilation, respiratory function stable and patient connected to nasal cannula oxygen Cardiovascular status: blood pressure returned to baseline and stable Postop Assessment: no apparent nausea or vomiting Anesthetic complications: no    Last Vitals:  Vitals:   07/09/18 1101 07/09/18 1107  BP: 118/63 (!) 112/56  Pulse: 91   Resp: (!) 30   Temp:    SpO2: 97%     Last Pain:  Vitals:   07/09/18 1104  TempSrc:   PainSc: 0-No pain                 Sophea Rackham

## 2018-07-11 ENCOUNTER — Telehealth: Payer: Self-pay | Admitting: Emergency Medicine

## 2018-07-11 ENCOUNTER — Encounter: Payer: Self-pay | Admitting: Family Medicine

## 2018-07-11 DIAGNOSIS — C3491 Malignant neoplasm of unspecified part of right bronchus or lung: Secondary | ICD-10-CM

## 2018-07-11 LAB — CULTURE, RESPIRATORY

## 2018-07-11 LAB — CULTURE, RESPIRATORY W GRAM STAIN
Culture: NO GROWTH
Culture: NO GROWTH

## 2018-07-11 NOTE — Telephone Encounter (Signed)
I spoke with the patient and her daughter regarding navigational bronchoscopy results from earlier this week.  The solid left sided nodule biopsies were nondiagnostic.  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  She will follow-up in our office next Monday.  She needs referral to [facility] and also an MRI of her brain.  I will order the oncology referral now.

## 2018-07-11 NOTE — Telephone Encounter (Signed)
Letter attached and placed back in RN box.  Harriet Butte, [place], PGY-3

## 2018-07-14 ENCOUNTER — Encounter: Payer: Self-pay | Admitting: Nurse Practitioner

## 2018-07-14 ENCOUNTER — Telehealth: Payer: Self-pay | Admitting: *Deleted

## 2018-07-14 ENCOUNTER — Ambulatory Visit: Payer: Medicare Other | Admitting: Nurse Practitioner

## 2018-07-14 DIAGNOSIS — C3411 Malignant neoplasm of upper lobe, right bronchus or lung: Secondary | ICD-10-CM | POA: Insufficient documentation

## 2018-07-14 DIAGNOSIS — C3490 Malignant neoplasm of unspecified part of unspecified bronchus or lung: Secondary | ICD-10-CM | POA: Diagnosis not present

## 2018-07-14 MED ORDER — TIOTROPIUM BROMIDE-OLODATEROL 2.5-2.5 MCG/ACT IN AERS: 2.0000 | INHALATION_SPRAY | Freq: Every day | RESPIRATORY_TRACT | 0 refills | Status: AC

## 2018-07-14 MED ORDER — PREDNISONE 10 MG PO TABS: ORAL_TABLET | ORAL | 0 refills | Status: DC

## 2018-07-14 NOTE — Assessment & Plan Note (Signed)
Patient Instructions  Will order MRI of brain Referral has already been placed to oncology Please keep upcoming appointment with oncology Will give sample of stiolto to trial May stop anoro due to nausea Follow up with Dr. Vaughan Browner in 4 weeks or sooner if needed

## 2018-07-14 NOTE — Telephone Encounter (Signed)
lmovm (DPR in chart) informing pt that she can pick up the form.  No need to scan as letter was provided and MD did not have to complete or sign the form (pt responsibility). Baily Serpe, Salome Spotted, CMA

## 2018-07-14 NOTE — Progress Notes (Signed)
@Patient  ID: Pamela Russell, female    DOB: 1946-05-16, 72 y.o.   MRN: [id number]  Chief Complaint  Patient presents with  . Results    Referring provider: Loretto Bing, DO  HPI 72 year old female active smoker with history of COPD, asthma, and recent biopsy which showed malignant non small cell carcinoma followed by Dr. Vaughan Browner.   Tests: Biopsy results:TRANSBRONCHIAL NEEDLE ASPIRATION, TARGET #2, SPECIMEN C, RUL NEEDLE BRUSHING (SPECIMEN 3 OF 7 COLLECTED 07/09/18) MALIGNANT CELLS PRESENT, CONSISTENT WITH NON SMALL CELL CARCINOMA. 06/24/18 - PET - Hypermetabolic LEFT upper lobe nodule just superior to the hilar vessels with intense metabolic activity is consistent primary bronchogenic carcinoma. New posterior wall thickening of the RIGHT upper lobe thin wall cavitary lesion with intense metabolic activity for size. Findings are concerning for a synchronous bronchogenic carcinoma. No evidence of metastatic mediastinal adenopathy or distant metastatic disease.  OV 07/14/18 - OV Biopsy results Patient presents today for biopsy results. She states that Dr. Lamonte Sakai did her biopsy and has already called patient with results - non small cell carcinoma of RUL. She states that she was told to come in today to get MRI ordered to rule out mets to brain. She already has her appointment set up with oncology. She also complains today of anoro causing nausea. She would like to switch to a different inhaler. She denies any fever, chest pain, or edema. She states that she is doing well over all.        Allergies  Allergen Reactions  . Codeine Anaphylaxis and Swelling  . Penicillins Anaphylaxis and Swelling    Childhood reaction. Has patient had a PCN reaction causing immediate rash, facial/tongue/throat swelling, SOB or lightheadedness with hypotension: Yes Has patient had a PCN reaction causing severe rash involving mucus membranes or skin necrosis: No Has patient had a PCN reaction that  required hospitalization: No Has patient had a PCN reaction occurring within the last 10 years: No If all of the above answers are "NO", then may proceed with Cephalosporin use.   Celedonio Miyamoto [Umeclidinium-Vilanterol] Nausea And Vomiting    severe    Immunization History  Administered Date(s) Administered  . Influenza,inj,Quad PF,6+ Mos 05/27/2017  . Influenza-Unspecified 05/20/2013, 06/08/2016  . Pneumococcal Conjugate-13 04/30/2014  . Pneumococcal-Unspecified 07/01/2014  . Tdap 07/23/2016  . Zoster 05/12/2013    Past Medical History:  Diagnosis Date  . Allergic rhinitis   . Asthma 2008  . Chronic obstructive pulmonary disease (COPD) ([place]) 06/08/2016   Meds: Spiriva, albuterol inhaler PFT (11/29/2016): Moderate-severe obstruction by PFT 11/2016  . Ear itching 07/05/2017  . Grieving 06/08/2016  . History of pneumonia    x4  . Hyperlipidemia 09/10/2016   On atorvastatin 40 mg QD, ASA 81 mg QD ASCVD risk 17.1%, mod-high intensity statin Risk factors: FHx sig for MI: mother 50s-50s, and father age 57, HTN, smoker  . Hypertension   . Night sweats 06/08/2016  . Pulmonary nodules 06/12/2017  . Tobacco use disorder 07/06/2016   On nicotine replacement patch. Smoking cigs since age 26. Has 60 pack year history. Has COPD dx on SABA and LAMA.    Tobacco History: Social History   Tobacco Use  Smoking Status Current Every Day Smoker  . Packs/day: 0.50  . Years: 60.00  . Pack years: 30.00  . Types: Cigarettes  . Start date: 08/21/1955  Smokeless Tobacco Never Used  Tobacco Comment   3 ppd for 20+ years, has cut down to 0.5 ppd in the  past 3 months   Ready to quit: No Counseling given: Yes Comment: 3 ppd for 20+ years, has cut down to 0.5 ppd in the past 3 months   Outpatient Encounter Medications as of 07/14/2018  Medication Sig  . albuterol (PROAIR HFA) 108 (90 Base) MCG/ACT inhaler Inhale 2 puffs into the lungs every 6 (six) hours as needed for wheezing or shortness of  breath.  Marland Kitchen amLODipine (NORVASC) 5 MG tablet Take 1 tablet (5 mg total) by mouth daily.  Marland Kitchen aspirin EC 81 MG tablet Take 81 mg by mouth daily.  Marland Kitchen atorvastatin (LIPITOR) 40 MG tablet Take 1 tablet (40 mg total) by mouth daily.  . cetirizine (ZYRTEC) 10 MG tablet Take 10 mg by mouth daily.  Marland Kitchen glucosamine-chondroitin 500-400 MG tablet Take 1 tablet by mouth daily.   Marland Kitchen guaifenesin (HUMIBID E) 400 MG TABS tablet Take 400 mg by mouth daily.   . hydrochlorothiazide (HYDRODIURIL) 25 MG tablet Take 1 tablet (25 mg total) by mouth daily.  . mometasone (ELOCON) 0.1 % lotion Apply 1 application topically daily.  . Multiple Vitamin (MULTIVITAMIN WITH MINERALS) TABS tablet Take 1 tablet by mouth daily.  . Tiotropium Bromide-Olodaterol (STIOLTO RESPIMAT) 2.5-2.5 MCG/ACT AERS Inhale 2 puffs into the lungs daily.  . [DISCONTINUED] predniSONE (DELTASONE) 10 MG tablet Take 1 tab for 3 days, then take 1/2 tablet for 3 days, then stop  . [DISCONTINUED] umeclidinium-vilanterol (ANORO ELLIPTA) 62.5-25 MCG/INH AEPB Inhale 1 puff into the lungs daily. (Patient not taking: Reported on 07/14/2018)   No facility-administered encounter medications on file as of 07/14/2018.      Review of Systems  Review of Systems  Constitutional: Negative.  Negative for chills and fever.  HENT: Negative.   Respiratory: Negative for cough, shortness of breath and wheezing.   Cardiovascular: Negative.  Negative for chest pain, palpitations and leg swelling.  Gastrointestinal: Negative.   Allergic/Immunologic: Negative.   Neurological: Negative.   Psychiatric/Behavioral: Negative.        Physical Exam  BP 132/78 (BP Location: Left Arm, Patient Position: Sitting, Cuff Size: Normal)   Pulse 74   Ht 5' 2.6" (1.59 m)   Wt 106 lb (48.1 kg)   SpO2 97%   BMI 19.02 kg/m   Wt Readings from Last 5 Encounters:  07/14/18 106 lb (48.1 kg)  07/09/18 105 lb (47.6 kg)  07/08/18 105 lb 11.2 oz (47.9 kg)  03/24/18 105 lb (47.6 kg)    01/22/18 104 lb (47.2 kg)     Physical Exam  Constitutional: She is oriented to person, place, and time. She appears well-developed and well-nourished. No distress.  Cardiovascular: Normal rate and regular rhythm.  Pulmonary/Chest: Effort normal and breath sounds normal.  Neurological: She is alert and oriented to person, place, and time.  Psychiatric: She has a normal mood and affect.  Nursing note and vitals reviewed.    Imaging: Nm Pet Image Initial (pi) Skull Base To Thigh  Result Date: 06/24/2018 CLINICAL DATA:  Initial treatment strategy for pulmonary nodule. EXAM: NUCLEAR MEDICINE PET SKULL BASE TO THIGH TECHNIQUE: 5.2 mCi F-18 FDG was injected intravenously. Full-ring PET imaging was performed from the skull base to thigh after the radiotracer. CT data was obtained and used for attenuation correction and anatomic localization. Fasting blood glucose: 112 mg/dl COMPARISON:  CT 03/20/2018, CT 06/12/2017 FINDINGS: Mediastinal blood pool activity: SUV max 1.7 NECK: No hypermetabolic lymph nodes in the neck. Incidental CT findings: none CHEST: LEFT upper lobe nodule just superior to the hilar vessels  measures 11 mm (image 27/8) with SUV max equal 14.4. No hypermetabolic mediastinal lymph nodes. Within the RIGHT upper, thin walled cavitary lesion measures by 15 mm by 15 mm (image 18/8). The thin walled cavitary portion lesion is similar to comparison CT from 06/12/2017 however on the posterior wall of this lesion there is new thickening measure approximately 5 mm. This new posterior wall thickening is hypermetabolic with SUV max equal 3.3. Incidental CT findings: none ABDOMEN/PELVIS: No abnormal hypermetabolic activity within the liver, pancreas, adrenal glands, or spleen. No hypermetabolic lymph nodes in the abdomen or pelvis. Incidental CT findings: Post hysterectomy Atherosclerotic calcification of the aorta. SKELETON: No focal hypermetabolic activity to suggest skeletal metastasis. Incidental  CT findings: none IMPRESSION: 1. Hypermetabolic LEFT upper lobe nodule just superior to the hilar vessels with intense metabolic activity is consistent primary bronchogenic carcinoma. 2. New posterior wall thickening of the RIGHT upper lobe thin wall cavitary lesion with intense metabolic activity for size. Findings are concerning for a synchronous bronchogenic carcinoma. 3. No evidence of metastatic mediastinal adenopathy or distant metastatic disease. Electronically Signed   By: Suzy Bouchard M.D.   On: 06/24/2018 10:03   Dg Chest Port 1 View  Result Date: 07/09/2018 CLINICAL DATA:  Post bronchoscopy, biopsy EXAM: PORTABLE CHEST 1 VIEW COMPARISON:  03/03/2005 FINDINGS: Is increasing opacity in the right upper lobe, possibly related to bronchoscopy. No pneumothorax. Left lung clear. Heart is normal size. No effusions or acute bony abnormality. IMPRESSION: Increasing vague opacities in the right upper lobe, likely related to bronchoscopy. No visible pneumothorax. Electronically Signed   By: Rolm Baptise M.D.   On: 07/09/2018 11:06   Ct Super D Chest Wo Contrast  Result Date: 07/02/2018 CLINICAL DATA:  Pulmonary nodules EXAM: CT CHEST WITHOUT CONTRAST TECHNIQUE: Multidetector CT imaging of the chest was performed using thin slice collimation for electromagnetic bronchoscopy planning purposes, without intravenous contrast. COMPARISON:  03/20/2018 FINDINGS: Cardiovascular: The heart size is normal. No substantial pericardial effusion. Coronary artery calcification is evident. Atherosclerotic calcification is noted in the wall of the thoracic aorta. Mediastinum/Nodes: No mediastinal lymphadenopathy. No evidence for gross hilar lymphadenopathy although assessment is limited by the lack of intravenous contrast on today's study. The esophagus has normal imaging features. There is no axillary lymphadenopathy. Lungs/Pleura: Biapical pleuroparenchymal scarring. Centrilobular paraseptal emphysema. Stable sub solid  13 mm nodule anterior aspect of the medial right upper lobe. More posteriorly is a cystic lesion with 6 mm nodular component as seen on the prior study. Multi lobular solid and sub solid lesion in the paramediastinal right upper lobe again demonstrates associated 7 mm component. 5 mm ground-glass nodule in the medial right upper lobe (48/3) is unchanged. 12 mm left suprahilar nodule (48/3) is similar to prior. This lesion was hypermetabolic on PET-CT of 37/16/9678. No focal airspace consolidation. No pulmonary edema or pleural effusion. Upper Abdomen: Unremarkable. Musculoskeletal: No worrisome lytic or sclerotic osseous abnormality. IMPRESSION: 1. No substantial interval change in the bilateral pulmonary nodules. Previous PET-CT noted hypermetabolism in the left suprahilar lesion and posterior right upper lobe cystic lesion with 6 mm posterior nodule. 2.  Aortic Atherosclerois (ICD10-170.0) 3.  Emphysema. (LFY10-F75.9) Electronically Signed   By: Misty Stanley M.D.   On: 07/02/2018 08:05   Dg C-arm Bronchoscopy  Result Date: 07/09/2018 C-ARM BRONCHOSCOPY: Fluoroscopy was utilized by the requesting physician.  No radiographic interpretation.     Assessment & Plan:   Malignant neoplasm of unspecified part of unspecified bronchus or lung [facility]) Patient Instructions  Will order  MRI of brain Referral has already been placed to oncology Please keep upcoming appointment with oncology Will give sample of stiolto to trial May stop anoro due to nausea Follow up with Dr. Vaughan Browner in 4 weeks or sooner if needed       Fenton Foy, NP 07/14/2018

## 2018-07-14 NOTE — Telephone Encounter (Signed)
Oncology Nurse Navigator Documentation  Oncology Nurse Navigator Flowsheets 07/14/2018  Navigator Location [facility]  Referral date to RadOnc/MedOnc 07/11/2018  Navigator Encounter Type Telephone/I received referral on Pamela Russell Friday.  I called her and scheduled her to be seen at [facility] on 07/31/18 arrive at 2:45.  She verbalized understanding of appt time and place.  I will update Dr. Agustina Caroli office.   Telephone Outgoing Call  Treatment Phase Pre-Tx/Tx Discussion  Barriers/Navigation Needs Education;Coordination of Care  Education Other  Interventions Coordination of Care;Education  Coordination of Care Appts  Education Method Verbal  Acuity Level 2  Time Spent with Patient 30

## 2018-07-14 NOTE — Patient Instructions (Signed)
Will order MRI of brain Referral has already been placed to oncology Please keep upcoming appointment with oncology Will give sample of stiolto to trial May stop anoro due to nausea Follow up with Dr. Vaughan Browner in 4 weeks or sooner if needed

## 2018-07-22 ENCOUNTER — Ambulatory Visit (HOSPITAL_COMMUNITY)
Admission: RE | Admit: 2018-07-22 | Discharge: 2018-07-22 | Disposition: A | Discharge status: Home / Self Care | Payer: Medicare Other | Source: Ambulatory Visit | Attending: Nurse Practitioner | Admitting: Nurse Practitioner

## 2018-07-22 ENCOUNTER — Telehealth: Payer: Self-pay | Admitting: Pulmonary Disease

## 2018-07-22 DIAGNOSIS — C3490 Malignant neoplasm of unspecified part of unspecified bronchus or lung: Secondary | ICD-10-CM | POA: Insufficient documentation

## 2018-07-22 NOTE — Telephone Encounter (Signed)
Pt was unable to complete MRI due to panic attack-was told to call her to get medication to help with her nerves so she can have MRI done.

## 2018-07-23 MED ORDER — ALPRAZOLAM 0.25 MG PO TABS: ORAL_TABLET | ORAL | 0 refills | Status: DC

## 2018-07-23 NOTE — Telephone Encounter (Signed)
Called and spoke with pt letting her know that Dr. Vaughan Browner stated to [place] to send Rx of Xanax to her pharmacy for her to take prior to the MRI. Pt expressed understanding.   I asked pt if she had rescheduled the MRI yet and pt stated she was told to get the Rx first and then once she had the Rx to call to reschedule the MRI. I stated to pt if she still was not able to complete the MRI even after xanax that Dr. Vaughan Browner said that we would order a CT but we would see if the xanax would help to be able to get the MRI completed.  I verified pt's preferred pharmacy. Called pt's pharmacy and spoke with Kasandra Knudsen, pharmacist giving him the verbal Rx information. Kasandra Knudsen stated he would get Rx ready for pt. Nothing further needed.

## 2018-07-23 NOTE — Telephone Encounter (Signed)
Order xanax 0.25 mg PO before procedure. If we are still unable to complete MRI then order CT brain with contrast.

## 2018-07-25 ENCOUNTER — Other Ambulatory Visit: Payer: Self-pay | Admitting: *Deleted

## 2018-07-25 ENCOUNTER — Ambulatory Visit (HOSPITAL_COMMUNITY): Admission: RE | Admit: 2018-07-25 | Payer: Medicare Other | Source: Ambulatory Visit

## 2018-07-25 NOTE — Progress Notes (Signed)
Patient's case discussed at thoracic cancer conference on 07/24/18 and documented for communication purpose.  Patient was not in attendance nor examine at cancer conference.

## 2018-07-31 ENCOUNTER — Ambulatory Visit
Admission: RE | Admit: 2018-07-31 | Discharge: 2018-07-31 | Disposition: A | Discharge status: Home / Self Care | Payer: Medicare Other | Source: Ambulatory Visit | Attending: Radiation Oncology | Admitting: Radiation Oncology

## 2018-07-31 ENCOUNTER — Encounter: Payer: Self-pay | Admitting: Thoracic Surgery (Cardiothoracic Vascular Surgery)

## 2018-07-31 ENCOUNTER — Institutional Professional Consult (permissible substitution) (INDEPENDENT_AMBULATORY_CARE_PROVIDER_SITE_OTHER): Payer: Medicare Other | Admitting: Thoracic Surgery (Cardiothoracic Vascular Surgery)

## 2018-07-31 VITALS — BP 182/84 | HR 100 | Temp 98.0°F | Resp 20 | Wt 106.3 lb

## 2018-07-31 DIAGNOSIS — R918 Other nonspecific abnormal finding of lung field: Secondary | ICD-10-CM | POA: Diagnosis not present

## 2018-07-31 DIAGNOSIS — C3490 Malignant neoplasm of unspecified part of unspecified bronchus or lung: Secondary | ICD-10-CM

## 2018-07-31 NOTE — Progress Notes (Signed)
PCP is [place] Bing, DO Referring Provider is [place] Bing, DO  No chief complaint on file.   HPI: Pamela Russell is a 72 yo woman with a past history of tobacco abuse, COPD, hypertension, hyperlipidemia and pneumonia. She started smoking at age 14 and smoked 3 ppd until recently when she has started to cut back. She started with low dose CT screening a year ago. She was found to have multiple ground glass opacities and has been followed since.   Recently she had increase in size of several of the nodules. A PET showed the lesions were hypermetabolic. 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   She is limited by dyspnea. She cannot get up a full flight of stairs without stopping to rest. She can walk < 1/2 block without stopping. No change in appetite or weight loss.  Zubrod Score: At the time of surgery this patient's most appropriate activity status/level should be described as: []     0    Normal activity, no symptoms []     1    Restricted in physical strenuous activity but ambulatory, able to do out light work [x]     2    Ambulatory and capable of self care, unable to do work activities, up and about >50 % of waking hours                              []     3    Only limited self care, in bed greater than 50% of waking hours []     4    Completely disabled, no self care, confined to bed or chair []     5    Moribund   Past Medical History:  Diagnosis Date  . Allergic rhinitis   . Asthma 2008  . Chronic obstructive pulmonary disease (COPD) ([place]) 06/08/2016   Meds: Spiriva, albuterol inhaler PFT (11/29/2016): Moderate-severe obstruction by PFT 11/2016  . Ear itching 07/05/2017  . Grieving 06/08/2016  . History of pneumonia    x4  . Hyperlipidemia 09/10/2016   On atorvastatin 40 mg QD, ASA 81 mg QD ASCVD risk 17.1%, mod-high intensity statin Risk factors: FHx sig for MI: mother 29s-50s, and father age 16, HTN, smoker  . Hypertension   . Night sweats 06/08/2016   . Pulmonary nodules 06/12/2017  . Tobacco use disorder 07/06/2016   On nicotine replacement patch. Smoking cigs since age 15. Has 60 pack year history. Has COPD dx on SABA and LAMA.    Past Surgical History:  Procedure Laterality Date  . CATARACT EXTRACTION Left 05/08/2016  . CATARACT EXTRACTION Right 05/22/2016  . TOTAL ABDOMINAL HYSTERECTOMY W/ BILATERAL SALPINGOOPHORECTOMY Bilateral 1975  . VIDEO BRONCHOSCOPY WITH ENDOBRONCHIAL NAVIGATION Left 07/09/2018   Procedure: VIDEO BRONCHOSCOPY WITH ENDOBRONCHIAL NAVIGATION;  Surgeon: Collene Gobble, MD;  Location: [facility] OR;  Service: Thoracic;  Laterality: Left;    Family History  Problem Relation Age of Onset  . Heart disease Mother        MI in her 54's  . Stroke Mother   . Heart disease Father        CABG  . Hypertension Father   . Heart disease Sister 14       CABG  . Stroke Sister   . Diabetes Sister     Social History Social History   Tobacco Use  . Smoking status: Current Every Day Smoker    Packs/day: 0.50  Years: 60.00    Pack years: 30.00    Types: Cigarettes    Start date: 08/21/1955  . Smokeless tobacco: Never Used  . Tobacco comment: 3 ppd for 20+ years, has cut down to 0.5 ppd in the past 3 months  Substance Use Topics  . Alcohol use: Yes    Alcohol/week: 1.0 standard drinks    Types: 1 Cans of beer per week    Comment: occ  . Drug use: No    Current Outpatient Medications  Medication Sig Dispense Refill  . albuterol (PROAIR HFA) 108 (90 Base) MCG/ACT inhaler Inhale 2 puffs into the lungs every 6 (six) hours as needed for wheezing or shortness of breath. 3 Inhaler 1  . ALPRAZolam (XANAX) 0.25 MG tablet Take 1 tablet prior to MRI procedure. 1 tablet 0  . amLODipine (NORVASC) 5 MG tablet Take 1 tablet (5 mg total) by mouth daily. 90 tablet 3  . aspirin EC 81 MG tablet Take 81 mg by mouth daily.    Marland Kitchen atorvastatin (LIPITOR) 40 MG tablet Take 1 tablet (40 mg total) by mouth daily. 90 tablet 3  . cetirizine  (ZYRTEC) 10 MG tablet Take 10 mg by mouth daily.    Marland Kitchen glucosamine-chondroitin 500-400 MG tablet Take 1 tablet by mouth daily.     Marland Kitchen guaifenesin (HUMIBID E) 400 MG TABS tablet Take 400 mg by mouth daily.     . hydrochlorothiazide (HYDRODIURIL) 25 MG tablet Take 1 tablet (25 mg total) by mouth daily. 90 tablet 3  . mometasone (ELOCON) 0.1 % lotion Apply 1 application topically daily.    . Multiple Vitamin (MULTIVITAMIN WITH MINERALS) TABS tablet Take 1 tablet by mouth daily.    . Tiotropium Bromide-Olodaterol (STIOLTO RESPIMAT) 2.5-2.5 MCG/ACT AERS Inhale 2 puffs into the lungs daily. 1 Inhaler 0   No current facility-administered medications for this visit.     Allergies  Allergen Reactions  . Codeine Anaphylaxis and Swelling  . Penicillins Anaphylaxis and Swelling    Childhood reaction. Has patient had a PCN reaction causing immediate rash, facial/tongue/throat swelling, SOB or lightheadedness with hypotension: Yes Has patient had a PCN reaction causing severe rash involving mucus membranes or skin necrosis: No Has patient had a PCN reaction that required hospitalization: No Has patient had a PCN reaction occurring within the last 10 years: No If all of the above answers are "NO", then may proceed with Cephalosporin use.   Celedonio Miyamoto [Umeclidinium-Vilanterol] Nausea And Vomiting    severe    Review of Systems  Constitutional: Negative for appetite change and unexpected weight change.  HENT: Negative for trouble swallowing and voice change.   Respiratory: Positive for shortness of breath and wheezing.   Cardiovascular: Negative for chest pain and leg swelling.  Gastrointestinal: Negative for abdominal pain.  Genitourinary: Negative for dysuria.  Musculoskeletal: Positive for arthralgias.  Neurological: Negative for dizziness and weakness.    BP (!) 182/84   Pulse 100   Temp 98 F (36.7 C)   Resp 20   Wt 106 lb 4.8 oz (48.2 kg)   SpO2 94%   BMI 19.07 kg/m  Physical  Exam Vitals signs reviewed.  Constitutional:      General: She is not in acute distress.    Comments: thin  HENT:     Head: Normocephalic and atraumatic.  Eyes:     Extraocular Movements: Extraocular movements intact.     Conjunctiva/sclera: Conjunctivae normal.  Neck:     Musculoskeletal: Neck supple. No muscular  tenderness.  Cardiovascular:     Rate and Rhythm: Normal rate and regular rhythm.     Heart sounds: No murmur.  Pulmonary:     Effort: Pulmonary effort is normal.     Breath sounds: Wheezing (faint bilaterally) present.  Abdominal:     General: There is no distension.     Palpations: Abdomen is soft.  Musculoskeletal:        General: No swelling.  Lymphadenopathy:     Cervical: No cervical adenopathy.  Skin:    General: Skin is warm and dry.  Neurological:     General: No focal deficit present.     Mental Status: She is alert.     Cranial Nerves: No cranial nerve deficit.     Motor: No weakness.     Coordination: Coordination normal.  Psychiatric:        Mood and Affect: Mood normal.     Diagnostic Tests: CT CHEST WITHOUT CONTRAST  TECHNIQUE: Multidetector CT imaging of the chest was performed using thin slice collimation for electromagnetic bronchoscopy planning purposes, without intravenous contrast.  COMPARISON:  03/20/2018  FINDINGS: Cardiovascular: The heart size is normal. No substantial pericardial effusion. Coronary artery calcification is evident. Atherosclerotic calcification is noted in the wall of the thoracic aorta.  Mediastinum/Nodes: No mediastinal lymphadenopathy. No evidence for gross hilar lymphadenopathy although assessment is limited by the lack of intravenous contrast on today's study. The esophagus has normal imaging features. There is no axillary lymphadenopathy.  Lungs/Pleura: Biapical pleuroparenchymal scarring. Centrilobular paraseptal emphysema. Stable sub solid 13 mm nodule anterior aspect of the medial right upper  lobe. More posteriorly is a cystic lesion with 6 mm nodular component as seen on the prior study. Multi lobular solid and sub solid lesion in the paramediastinal right upper lobe again demonstrates associated 7 mm component. 5 mm ground-glass nodule in the medial right upper lobe (48/3) is unchanged. 12 mm left suprahilar nodule (48/3) is similar to prior. This lesion was hypermetabolic on PET-CT of 99/83/3825. No focal airspace consolidation. No pulmonary edema or pleural effusion.  Upper Abdomen: Unremarkable.  Musculoskeletal: No worrisome lytic or sclerotic osseous abnormality.  IMPRESSION: 1. No substantial interval change in the bilateral pulmonary nodules. Previous PET-CT noted hypermetabolism in the left suprahilar lesion and posterior right upper lobe cystic lesion with 6 mm posterior nodule. 2.  Aortic Atherosclerois (ICD10-170.0) 3.  Emphysema. (KNL97-Q73.9)   Electronically Signed   By: Misty Stanley M.D.   On: 07/02/2018 08:05 NUCLEAR MEDICINE PET SKULL BASE TO THIGH  TECHNIQUE: 5.2 mCi F-18 FDG was injected intravenously. Full-ring PET imaging was performed from the skull base to thigh after the radiotracer. CT data was obtained and used for attenuation correction and anatomic localization.  Fasting blood glucose: 112 mg/dl  COMPARISON:  CT 03/20/2018, CT 06/12/2017  FINDINGS: Mediastinal blood pool activity: SUV max 1.7  NECK: No hypermetabolic lymph nodes in the neck.  Incidental CT findings: none  CHEST: LEFT upper lobe nodule just superior to the hilar vessels measures 11 mm (image 27/8) with SUV max equal 14.4.  No hypermetabolic mediastinal lymph nodes.  Within the RIGHT upper, thin walled cavitary lesion measures by 15 mm by 15 mm (image 18/8). The thin walled cavitary portion lesion is similar to comparison CT from 06/12/2017 however on the posterior wall of this lesion there is new thickening measure approximately 5 mm. This  new posterior wall thickening is hypermetabolic with SUV max equal 3.3.  Incidental CT findings: none  ABDOMEN/PELVIS: No abnormal hypermetabolic  activity within the liver, pancreas, adrenal glands, or spleen. No hypermetabolic lymph nodes in the abdomen or pelvis.  Incidental CT findings: Post hysterectomy Atherosclerotic calcification of the aorta.  SKELETON: No focal hypermetabolic activity to suggest skeletal metastasis.  Incidental CT findings: none  IMPRESSION: 1. Hypermetabolic LEFT upper lobe nodule just superior to the hilar vessels with intense metabolic activity is consistent primary bronchogenic carcinoma. 2. New posterior wall thickening of the RIGHT upper lobe thin wall cavitary lesion with intense metabolic activity for size. Findings are concerning for a synchronous bronchogenic carcinoma. 3. No evidence of metastatic mediastinal adenopathy or distant metastatic disease.   Electronically Signed   By: Suzy Bouchard M.D.   On: 06/24/2018 10:03 I personally reviewed the CT and PET images and concur with the findings noted above  Pulmonary function testing FVC=  2.19(78%) FEV1= 1.18(56%) DLCO= 13.68(61%)  Impression: 72 yo woman with a history of tobacco abuse, COPD and multiple lung nodules including a 1.2 cm left upper lobe nodule and multiple right upper lobe nodules. The cavitary nodule in the right upper lobe is an adenocarcinoma. In all likelihood the other nodules are also adenocarcinomas as well. She has significant COPD with an FEV1 of 56%, but her exercise capacity is lower than that.   We discussed potential treatment options for her multifocal cancer. Options include staged upper lobectomies, surgery on one side and SBRT to the contralateral side. I do not think she has adequate pulmonary reserve to tolerate bilateral upper lobectomies. Surgery on one side with radiation to the other is an option but does not confer a survival advantage over  SBRT. Surgery would provide adequate tissue for molecular testing.  She is not interested in having surgical resection.  Plan: She will meet with Dr. Lisbeth Renshaw of Radiation Oncology to discuss possible SBRT  Melrose Nakayama, MD [facility] [phone]

## 2018-07-31 NOTE — Progress Notes (Signed)
Radiation Oncology         [phone] ________________________________  Name: Pamela Russell        MRN: [id number]  Date of Service: 07/31/2018 DOB: 13-Jul-1946  CC:[place] Bing, DO  Byrum, Rose Fillers, MD     REFERRING PHYSICIAN: Collene Gobble, MD   DIAGNOSIS: The encounter diagnosis was Malignant neoplasm of unspecified part of unspecified bronchus or lung ([place]).   HISTORY OF PRESENT ILLNESS: Pamela Russell is a 72 y.o. female seen at the request of Dr. 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 The patient was part of the low dose CT screening program at [facility] and a year ago was found to have multiple ground glass opacities. She has had recent CT imaging on 03/20/18 that revealed concerns for progressive changes in the RUL and LUL. A PET scan on 06/24/18 revealed hypermetabolism in the 12 mm LUL nodules and in the RUL in the 15 mm lesion. She underwent bronchoscopy and the RUL lesion revealed adenocarcinoma, and the LUL lesion was an acellular specimen. She comes today to discuss options of treatment for her cancers.    PREVIOUS RADIATION THERAPY: No   PAST MEDICAL HISTORY:  Past Medical History:  Diagnosis Date  . Allergic rhinitis   . Asthma 2008  . Chronic obstructive pulmonary disease (COPD) ([place]) 06/08/2016   Meds: Spiriva, albuterol inhaler PFT (11/29/2016): Moderate-severe obstruction by PFT 11/2016  . Ear itching 07/05/2017  . Grieving 06/08/2016  . History of pneumonia    x4  . Hyperlipidemia 09/10/2016   On atorvastatin 40 mg QD, ASA 81 mg QD ASCVD risk 17.1%, mod-high intensity statin Risk factors: FHx sig for MI: mother 46s-50s, and father age 99, HTN, smoker  . Hypertension   . Night sweats 06/08/2016  . Pulmonary nodules 06/12/2017  . Tobacco use disorder 07/06/2016   On nicotine replacement patch. Smoking cigs since age 28. Has 60 pack year history. Has COPD dx on SABA and LAMA.        PAST SURGICAL HISTORY: Past Surgical History:  Procedure Laterality Date  . CATARACT EXTRACTION Left 05/08/2016  . CATARACT EXTRACTION Right 05/22/2016  . TOTAL ABDOMINAL HYSTERECTOMY W/ BILATERAL SALPINGOOPHORECTOMY Bilateral 1975  . VIDEO BRONCHOSCOPY WITH ENDOBRONCHIAL NAVIGATION Left 07/09/2018   Procedure: VIDEO BRONCHOSCOPY WITH ENDOBRONCHIAL NAVIGATION;  Surgeon: Collene Gobble, MD;  Location: [facility] OR;  Service: Thoracic;  Laterality: Left;     FAMILY HISTORY:  Family History  Problem Relation Age of Onset  . Heart disease Mother        MI in her 81's  . Stroke Mother   . Heart disease Father        CABG  . Hypertension Father   . Heart disease Sister 54       CABG  . Stroke Sister   . Diabetes Sister      SOCIAL HISTORY:  reports that she has been smoking cigarettes. She started smoking about 62 years ago. She has a 30.00 pack-year smoking history. She has never used smokeless tobacco. She reports current alcohol use of about 1.0 standard drinks of alcohol per week. She reports that she does not use drugs. The patient is widowed and lives in [place]. She's accompanied by her daughter.   ALLERGIES: Codeine; Penicillins; and Anoro ellipta [umeclidinium-vilanterol]   MEDICATIONS:  Current Outpatient Medications  Medication Sig Dispense Refill  . albuterol (PROAIR HFA) 108 (90 Base) MCG/ACT inhaler  Inhale 2 puffs into the lungs every 6 (six) hours as needed for wheezing or shortness of breath. 3 Inhaler 1  . ALPRAZolam (XANAX) 0.25 MG tablet Take 1 tablet prior to MRI procedure. 1 tablet 0  . amLODipine (NORVASC) 5 MG tablet Take 1 tablet (5 mg total) by mouth daily. 90 tablet 3  . aspirin EC 81 MG tablet Take 81 mg by mouth daily.    Marland Kitchen atorvastatin (LIPITOR) 40 MG tablet Take 1 tablet (40 mg total) by mouth daily. 90 tablet 3  . cetirizine (ZYRTEC) 10 MG tablet Take 10 mg by mouth daily.    Marland Kitchen glucosamine-chondroitin 500-400 MG tablet Take 1 tablet by mouth  daily.     Marland Kitchen guaifenesin (HUMIBID E) 400 MG TABS tablet Take 400 mg by mouth daily.     . hydrochlorothiazide (HYDRODIURIL) 25 MG tablet Take 1 tablet (25 mg total) by mouth daily. 90 tablet 3  . mometasone (ELOCON) 0.1 % lotion Apply 1 application topically daily.    . Multiple Vitamin (MULTIVITAMIN WITH MINERALS) TABS tablet Take 1 tablet by mouth daily.    . Tiotropium Bromide-Olodaterol (STIOLTO RESPIMAT) 2.5-2.5 MCG/ACT AERS Inhale 2 puffs into the lungs daily. 1 Inhaler 0   No current facility-administered medications for this encounter.      REVIEW OF SYSTEMS: On review of systems, the patient reports that she is doing well overall. She does have some cough and occasional shortness of breath with exertion. She denies any chest pain, fevers, chills, night sweats, unintended weight changes. She denies any bowel or bladder disturbances, and denies abdominal pain, nausea or vomiting. She denies any new musculoskeletal or joint aches or pains. A complete review of systems is obtained and is otherwise negative.     PHYSICAL EXAM:  Wt Readings from Last 3 Encounters:  07/31/18 106 lb 4.8 oz (48.2 kg)  07/31/18 106 lb 4.8 oz (48.2 kg)  07/14/18 106 lb (48.1 kg)   Temp Readings from Last 3 Encounters:  07/31/18 98 F (36.7 C)  07/31/18 98 F (36.7 C)  07/09/18 97.9 F (36.6 C)   BP Readings from Last 3 Encounters:  07/31/18 (!) 182/84  07/31/18 (!) 182/84  07/14/18 132/78   Pulse Readings from Last 3 Encounters:  07/31/18 100  07/31/18 100  07/14/18 74     In general this is a well appearing caucasian female in no acute distress. She is alert and oriented x4 and appropriate throughout the examination. HEENT reveals that the patient is normocephalic, atraumatic. EOMs are intact. PERRLA. Skin is intact without any evidence of gross lesions. Cardiovascular exam reveals a regular rate and rhythm, no clicks rubs or murmurs are auscultated. Chest is clear to auscultation bilaterally.  Lymphatic assessment is performed and does not reveal any adenopathy in the cervical, supraclavicular, axillary, or inguinal chains. Abdomen has active bowel sounds in all quadrants and is intact. The abdomen is soft, non tender, non distended. Lower extremities are negative for pretibial pitting edema, deep calf tenderness, cyanosis or clubbing.   ECOG = 1  0 - Asymptomatic (Fully active, able to carry on all predisease activities without restriction)  1 - Symptomatic but completely ambulatory (Restricted in physically strenuous activity but ambulatory and able to carry out work of a light or sedentary nature. For example, light housework, office work)  2 - Symptomatic, <50% in bed during the day (Ambulatory and capable of all self care but unable to carry out any work activities. Up and about more than 50%  of waking hours)  3 - Symptomatic, >50% in bed, but not bedbound (Capable of only limited self-care, confined to bed or chair 50% or more of waking hours)  4 - Bedbound (Completely disabled. Cannot carry on any self-care. Totally confined to bed or chair)  5 - Death   Eustace Pen MM, Creech RH, Tormey DC, et al. [phone]). "Toxicity and response criteria of the [employer] [facility] Group". [place] Oncol. 5 (6): 649-55    LABORATORY DATA:  Lab Results  Component Value Date   WBC 8.2 07/08/2018   HGB 14.0 07/08/2018   HCT 42.0 07/08/2018   MCV 93.1 07/08/2018   PLT 203 07/08/2018   Lab Results  Component Value Date   NA 134 (L) 07/08/2018   K 3.5 07/08/2018   CL 98 07/08/2018   CO2 26 07/08/2018   Lab Results  Component Value Date   ALT 22 07/08/2018   AST 19 07/08/2018   ALKPHOS 58 07/08/2018   BILITOT 0.9 07/08/2018      RADIOGRAPHY: Dg Chest Port 1 View  Result Date: 07/09/2018 CLINICAL DATA:  Post bronchoscopy, biopsy EXAM: PORTABLE CHEST 1 VIEW COMPARISON:  03/03/2005 FINDINGS: Is increasing opacity in the right upper lobe, possibly related to bronchoscopy.  No pneumothorax. Left lung clear. Heart is normal size. No effusions or acute bony abnormality. IMPRESSION: Increasing vague opacities in the right upper lobe, likely related to bronchoscopy. No visible pneumothorax. Electronically Signed   By: Rolm Baptise M.D.   On: 07/09/2018 11:06   Dg C-arm Bronchoscopy  Result Date: 07/09/2018 C-ARM BRONCHOSCOPY: Fluoroscopy was utilized by the requesting physician.  No radiographic interpretation.       IMPRESSION/PLAN: 1. 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 Lisbeth Renshaw discusses the pathology findings and reviews the nature of early stage lung cancers. She is a candidate for surgery but she would like to avoid surgery. She is aware that surgery is the standard of care  We discussed the risks, benefits, short, and long term effects of radiotherapy, and the patient is interested in proceeding. Dr. Lisbeth Renshaw discusses the delivery and logistics of radiotherapy and anticipates a course of 3-5 fractions over about 1 to 1/2 weeks of radiotherapy. Written consent is obtained and placed in the chart, a copy was provided to the patient. She would like to return in a few weeks after the holidays to proceed. Our staff will call her to schedule simulation. 2. HTN. The patient reports she's very anxious today but is not symptomatic from her blood pressure. She will follow this at home and with her PCP.   The above documentation reflects my direct findings during this shared patient visit. Please see the separate note by Dr. Lisbeth Renshaw on this date for the remainder of the patient's plan of care.    Carola Rhine, PAC

## 2018-08-06 ENCOUNTER — Telehealth: Payer: Self-pay | Admitting: *Deleted

## 2018-08-06 NOTE — Telephone Encounter (Signed)
Oncology Nurse Navigator Documentation  Oncology Nurse Navigator Flowsheets 08/06/2018  Navigator Location [facility]  Navigator Encounter Type Telephone/I followed up on Pamela Russell's treatment plan and her schedule. She is scheduled to have SIM on 08/22/18.  I called Pamela Russell to see if she had any questions or concerns regarding her treatment.  She states she was aware of her appt and no questions at this time.   I did speak to her about her smoking cessation.  I gave her tips and tricks to help her quit. She stated she will try the tips.  She was thankful for the help.   Telephone Outgoing Call  Abnormal Finding Date 12/13/2017  Confirmed Diagnosis Date 07/09/2018  Multidisiplinary Clinic Date 07/31/2018  Treatment Initiated Date 08/22/2018  Treatment Phase Pre-Tx/Tx Discussion  Barriers/Navigation Needs Education  Education Smoking cessation;Other  Interventions Education  Education Method Verbal  Acuity Level 1  Time Spent with Patient 30

## 2018-08-08 LAB — FUNGUS CULTURE WITH STAIN

## 2018-08-08 LAB — FUNGAL ORGANISM REFLEX

## 2018-08-08 LAB — FUNGUS CULTURE RESULT

## 2018-08-22 ENCOUNTER — Ambulatory Visit
Admission: RE | Admit: 2018-08-22 | Discharge: 2018-08-22 | Disposition: A | Discharge status: Home / Self Care | Payer: Medicare Other | Source: Ambulatory Visit | Attending: Radiation Oncology | Admitting: Radiation Oncology

## 2018-08-22 DIAGNOSIS — C3412 Malignant neoplasm of upper lobe, left bronchus or lung: Secondary | ICD-10-CM

## 2018-08-22 DIAGNOSIS — C3411 Malignant neoplasm of upper lobe, right bronchus or lung: Secondary | ICD-10-CM | POA: Insufficient documentation

## 2018-08-22 DIAGNOSIS — Z510 Encounter for antineoplastic radiation therapy: Secondary | ICD-10-CM | POA: Diagnosis not present

## 2018-08-22 LAB — ACID FAST CULTURE WITH REFLEXED SENSITIVITIES

## 2018-08-22 LAB — ACID FAST CULTURE WITH REFLEXED SENSITIVITIES (MYCOBACTERIA)
Acid Fast Culture: NEGATIVE
Acid Fast Culture: NEGATIVE

## 2018-08-27 ENCOUNTER — Other Ambulatory Visit: Payer: Self-pay | Admitting: Family Medicine

## 2018-08-27 DIAGNOSIS — E782 Mixed hyperlipidemia: Secondary | ICD-10-CM

## 2018-08-27 DIAGNOSIS — I10 Essential (primary) hypertension: Secondary | ICD-10-CM

## 2018-08-28 ENCOUNTER — Encounter: Payer: Self-pay | Admitting: Family Medicine

## 2018-08-29 DIAGNOSIS — Z510 Encounter for antineoplastic radiation therapy: Secondary | ICD-10-CM | POA: Diagnosis not present

## 2018-09-03 ENCOUNTER — Ambulatory Visit
Admission: RE | Admit: 2018-09-03 | Discharge: 2018-09-03 | Disposition: A | Discharge status: Home / Self Care | Payer: Medicare Other | Source: Ambulatory Visit | Attending: Radiation Oncology | Admitting: Radiation Oncology

## 2018-09-03 DIAGNOSIS — Z510 Encounter for antineoplastic radiation therapy: Secondary | ICD-10-CM | POA: Diagnosis not present

## 2018-09-05 ENCOUNTER — Ambulatory Visit
Admission: RE | Admit: 2018-09-05 | Discharge: 2018-09-05 | Disposition: A | Discharge status: Home / Self Care | Payer: Medicare Other | Source: Ambulatory Visit | Attending: Radiation Oncology | Admitting: Radiation Oncology

## 2018-09-05 DIAGNOSIS — Z510 Encounter for antineoplastic radiation therapy: Secondary | ICD-10-CM | POA: Diagnosis not present

## 2018-09-08 ENCOUNTER — Ambulatory Visit
Admission: RE | Admit: 2018-09-08 | Discharge: 2018-09-08 | Disposition: A | Discharge status: Home / Self Care | Payer: Medicare Other | Source: Ambulatory Visit | Attending: Radiation Oncology | Admitting: Radiation Oncology

## 2018-09-08 DIAGNOSIS — C3412 Malignant neoplasm of upper lobe, left bronchus or lung: Secondary | ICD-10-CM | POA: Insufficient documentation

## 2018-09-08 DIAGNOSIS — Z510 Encounter for antineoplastic radiation therapy: Secondary | ICD-10-CM | POA: Diagnosis not present

## 2018-09-08 NOTE — Progress Notes (Signed)
[facility] Simulation and Treatment Planning Note   Name:  Pamela Russell MRN: [id number]   Date: 08/22/2018  DOB: 1945-10-21  Status:outpatient    DIAGNOSIS:    ICD-10-CM   1. 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  Identity was confirmed.  All relevant records and images related to the planned course of therapy were reviewed.  Then, the patient was positioned in a stable reproducible clinical set-up for radiation therapy. Abdominal compression was applied by me.  4D CT images were obtained and reproducible breathing pattern was confirmed. Free breathing CT images were obtained.  Skin markings were placed.  The CT images were loaded into the planning software where the target and avoidance structures were contoured.  The radiation prescription was entered and confirmed.    TREATMENT PLANNING NOTE:  Treatment planning then occurred. I have requested : IMRT planning.This treatment technique is medically necessary due to the high-dose of radiation delivered to the target region which is in close proximity to adjacent critical normal structures.  3 dimensional simulation is performed and dose volume histogram of the gross tumor volume, planning tumor volume and criticial normal structures including the spinal cord and lungs were analyzed and requested.  Special treatment procedure was performed due to high dose per fraction.  The patient will be monitored for increased risk of toxicity.  Daily imaging using cone beam CT will be used for target localization.  I anticipate that the patient will receive 54 Gy  in 3 fractions to target volume to the right lung tumor and 50 Gy in 5 fractions to the left lung tumor.   Further adjustments will be made based on the planning process is necessary.  ------------------------------------------------  Jodelle Gross, MD, PhD

## 2018-09-10 ENCOUNTER — Ambulatory Visit
Admission: RE | Admit: 2018-09-10 | Discharge: 2018-09-10 | Disposition: A | Discharge status: Home / Self Care | Payer: Medicare Other | Source: Ambulatory Visit | Attending: Radiation Oncology | Admitting: Radiation Oncology

## 2018-09-10 DIAGNOSIS — Z510 Encounter for antineoplastic radiation therapy: Secondary | ICD-10-CM | POA: Diagnosis not present

## 2018-09-11 ENCOUNTER — Telehealth: Payer: Self-pay | Admitting: Pulmonary Disease

## 2018-09-11 NOTE — Telephone Encounter (Signed)
Spoke with the pt  She states that oncology prefers that she do her PET 6-8 wks post treatment  Her last treatment will be tomorrow  They went ahead and cancelled the PET that was scheduled for 09/24/2018  Please advise if okay to reorder this  Thanks

## 2018-09-11 NOTE — Telephone Encounter (Signed)
Please clarify. We did not order that PET scan for 09/24/2018 in the first place.  Oncology should be able to order the PET scan follow up as per their recommendations.

## 2018-09-11 NOTE — Telephone Encounter (Signed)
Called and spoke with Patient.  She is aware that PET was cancelled and oncology is to reorder per recommendations. Nothing further at this time.

## 2018-09-12 ENCOUNTER — Encounter: Payer: Self-pay | Admitting: Radiation Oncology

## 2018-09-12 ENCOUNTER — Ambulatory Visit
Admission: RE | Admit: 2018-09-12 | Discharge: 2018-09-12 | Disposition: A | Discharge status: Home / Self Care | Payer: Medicare Other | Source: Ambulatory Visit | Attending: Radiation Oncology | Admitting: Radiation Oncology

## 2018-09-12 DIAGNOSIS — Z510 Encounter for antineoplastic radiation therapy: Secondary | ICD-10-CM | POA: Diagnosis not present

## 2018-09-22 ENCOUNTER — Encounter: Payer: Self-pay | Admitting: *Deleted

## 2018-09-22 NOTE — Progress Notes (Signed)
Oncology Nurse Navigator Documentation  Oncology Nurse Navigator Flowsheets 09/22/2018  Navigator Location [facility]  Navigator Encounter Type Telephone/I called Pamela Russell to check to see if she has quit smoking.  She has almost quit and I listened to her as she explained. I talked to her about seeing me for smoking cessation appt when she come for a follow up next month.  She would like the appt and if she has not quit to discuss more tips to help.    Telephone Outgoing Call  Treatment Phase Follow-up  Barriers/Navigation Needs Education  Education Smoking cessation;Other  Interventions Education  Education Method Verbal  Acuity Level 2  Time Spent with Patient 30

## 2018-09-24 ENCOUNTER — Ambulatory Visit (HOSPITAL_COMMUNITY): Payer: Medicare Other

## 2018-10-09 NOTE — Progress Notes (Signed)
  Radiation Oncology         [phone] ________________________________  Name: Pamela Russell MRN: [id number]  Date: 09/12/2018  DOB: Jun 13, 1946  End of Treatment Note  Diagnosis:   73 y.o. female with Stage IA2, cT1bN0M0 NSCLC, Adenocarcinoma of the RUL and Putative Stage IA2, T1bN0M0 NSCLC of the LUL   Indication for treatment:  Curative       Radiation treatment dates:   09/03/2018, 09/05/2018, 09/08/2018, 09/10/2018, 09/12/2018  Site/dose:   The tumors in the bilateral lungs were treated with a course of stereotactic body radiation treatment. The RUL tumor received 54 Gy in 3 fractions at 18 Gy per fraction. The LUL tumor received 50 Gy in 5 fractions at 10 Gy per fraction.  Beams/energy:   SBRT/SRT-VMAT // 6X-FFF Photon  Narrative: The patient tolerated radiation treatment relatively well.   The patient did not have any signs of acute toxicity during treatment.  Plan: The patient has completed radiation treatment. The patient will return to radiation oncology clinic for routine followup in one month. I advised the patient to call or return sooner if they have any questions or concerns related to their recovery or treatment.   ------------------------------------------------  Jodelle Gross, MD, PhD  This document serves as a record of services personally performed by Kyung Rudd, MD. It was created on his behalf by Rae Lips, a trained medical scribe. The creation of this record is based on the scribe's personal observations and the provider's statements to them. This document has been checked and approved by the attending provider.

## 2018-10-21 ENCOUNTER — Encounter: Payer: Self-pay | Admitting: Radiation Oncology

## 2018-10-21 ENCOUNTER — Ambulatory Visit (INDEPENDENT_AMBULATORY_CARE_PROVIDER_SITE_OTHER): Payer: Medicare Other | Admitting: Nurse Practitioner

## 2018-10-21 ENCOUNTER — Encounter: Payer: Self-pay | Admitting: Nurse Practitioner

## 2018-10-21 ENCOUNTER — Ambulatory Visit
Admission: RE | Admit: 2018-10-21 | Discharge: 2018-10-21 | Disposition: A | Discharge status: Home / Self Care | Payer: Medicare Other | Source: Ambulatory Visit | Attending: Radiation Oncology | Admitting: Radiation Oncology

## 2018-10-21 ENCOUNTER — Ambulatory Visit (INDEPENDENT_AMBULATORY_CARE_PROVIDER_SITE_OTHER)
Admission: RE | Admit: 2018-10-21 | Discharge: 2018-10-21 | Disposition: A | Discharge status: Home / Self Care | Payer: Medicare Other | Source: Ambulatory Visit | Attending: Nurse Practitioner | Admitting: Nurse Practitioner

## 2018-10-21 ENCOUNTER — Other Ambulatory Visit: Payer: Self-pay

## 2018-10-21 VITALS — BP 153/75 | HR 94 | Temp 97.9°F | Resp 28 | Ht 62.6 in | Wt 108.2 lb

## 2018-10-21 VITALS — BP 144/64 | HR 95 | Temp 97.9°F | Ht 64.0 in | Wt 110.2 lb

## 2018-10-21 DIAGNOSIS — Z7982 Long term (current) use of aspirin: Secondary | ICD-10-CM | POA: Insufficient documentation

## 2018-10-21 DIAGNOSIS — Z79899 Other long term (current) drug therapy: Secondary | ICD-10-CM | POA: Diagnosis not present

## 2018-10-21 DIAGNOSIS — C3411 Malignant neoplasm of upper lobe, right bronchus or lung: Secondary | ICD-10-CM | POA: Diagnosis not present

## 2018-10-21 DIAGNOSIS — J449 Chronic obstructive pulmonary disease, unspecified: Secondary | ICD-10-CM | POA: Diagnosis not present

## 2018-10-21 DIAGNOSIS — J441 Chronic obstructive pulmonary disease with (acute) exacerbation: Secondary | ICD-10-CM | POA: Diagnosis not present

## 2018-10-21 DIAGNOSIS — C3412 Malignant neoplasm of upper lobe, left bronchus or lung: Secondary | ICD-10-CM

## 2018-10-21 MED ORDER — PREDNISONE 10 MG PO TABS: ORAL_TABLET | ORAL | 0 refills | Status: DC

## 2018-10-21 MED ORDER — DOXYCYCLINE HYCLATE 100 MG PO TABS: 100.0000 mg | ORAL_TABLET | Freq: Two times a day (BID) | ORAL | 0 refills | Status: DC

## 2018-10-21 MED ORDER — TIOTROPIUM BROMIDE-OLODATEROL 2.5-2.5 MCG/ACT IN AERS: 2.0000 | INHALATION_SPRAY | Freq: Every day | RESPIRATORY_TRACT | 0 refills | Status: DC

## 2018-10-21 MED ORDER — METHYLPREDNISOLONE ACETATE 80 MG/ML IJ SUSP
80.0000 mg | Freq: Once | INTRAMUSCULAR | Status: AC
  Administered 2018-10-21: 80 mg via INTRAMUSCULAR

## 2018-10-21 MED ORDER — LEVALBUTEROL HCL 0.63 MG/3ML IN NEBU
0.6300 mg | INHALATION_SOLUTION | Freq: Once | RESPIRATORY_TRACT | Status: AC
  Administered 2018-10-21: 0.63 mg via RESPIRATORY_TRACT

## 2018-10-21 NOTE — Progress Notes (Signed)
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   Interval Since Last Radiation: 6 weeks   09/03/2018-09/12/2018 SBRT: The RUL tumor received 54 Gy in 3 fractions at 18 Gy per fraction.  The LUL tumor received 50 Gy in 5 fractions at 10 Gy per fraction.  Narrative:  The patient returns today for routine follow-up.  The patient tolerated radiotherapy well. She reports that since radiation ended, she's had progressive difficulty with shortness of breath. She denies any fevers or chills. She has chronic cough with clear sputum but no increase in the amount. She has been using her albuterol every 4 hours at times. She reports she's had trouble tolerating other inhalers. She denies any chest pain. No other complaints are noted.  ALLERGIES:  is allergic to codeine; penicillins; and anoro ellipta [umeclidinium-vilanterol].  Meds: Current Outpatient Medications  Medication Sig Dispense Refill  . albuterol (PROAIR HFA) 108 (90 Base) MCG/ACT inhaler Inhale 2 puffs into the lungs every 6 (six) hours as needed for wheezing or shortness of breath. 3 Inhaler 1  . amLODipine (NORVASC) 5 MG tablet TAKE 1 TABLET BY MOUTH  DAILY 90 tablet 3  . aspirin EC 81 MG tablet Take 81 mg by mouth daily.    Marland Kitchen atorvastatin (LIPITOR) 40 MG tablet TAKE 1 TABLET BY MOUTH  DAILY 90 tablet 3  . cetirizine (ZYRTEC) 10 MG tablet Take 10 mg by mouth daily.    Marland Kitchen glucosamine-chondroitin 500-400 MG tablet Take 1 tablet by mouth daily.     Marland Kitchen guaifenesin (HUMIBID E) 400 MG TABS tablet Take 400 mg by mouth daily.     . hydrochlorothiazide (HYDRODIURIL) 25 MG tablet TAKE 1 TABLET BY MOUTH  DAILY 90 tablet 3  . mometasone (ELOCON) 0.1 %  lotion Apply 1 application topically daily.    . Multiple Vitamin (MULTIVITAMIN WITH MINERALS) TABS tablet Take 1 tablet by mouth daily.    Marland Kitchen ALPRAZolam (XANAX) 0.25 MG tablet Take 1 tablet prior to MRI procedure. (Patient not taking: Reported on 10/21/2018) 1 tablet 0   No current facility-administered medications for this encounter.     Physical Findings:  height is 5' 2.6" (1.59 m) and weight is 108 lb 3.2 oz (49.1 kg). Her oral temperature is 97.9 F (36.6 C). Her blood pressure is 153/75 (abnormal) and her pulse is 94. Her respiration is 28 (abnormal) and oxygen saturation is 96%.  Pain Assessment Pain Score: 0-No pain/10 In general this is a tired and short of breath appearing caucasian female in no acute distress. She is using accessory muscles for breathing and becomes tachypenic with talking. She's alert and oriented x4 and appropriate throughout the examination. Cardiopulmonary assessment is negative for acute distress and she exhibits normal effort.  RRR, no C/R/M, and chest reveals coarse breath sounds throughout with crackles in all fields. No consolidation is noted. No lower extremity edema is noted.  Lab Findings: Lab Results  Component Value Date   WBC 8.2 07/08/2018   HGB 14.0 07/08/2018   HCT 42.0 07/08/2018   MCV 93.1 07/08/2018   PLT 203 07/08/2018     Radiographic Findings: No results found.  Impression/Plan: 1.  Stage IA2, cT1bN0M0 NSCLC, Adenocarcinoma  of the RUL and Putative Stage IA2, T1bN0M0 NSCLC of the LUL. The patient tolerated the radiotherapy but I'm suspicious she is having a COPD exacerbation. It would be less likely radiation pneumonitis, though possible. I've contacted Dr. Corky Downs office and she will be seen by Lazaro Arms, NP for evaluation today. We will plan to proceed with CT chest with contrast in the next week or so to see how she's responding to treatment.  2. Shortness of Breath. I'm concerned the patient is having symptoms of COPD  exacerbation. She will be seen today by pulmonary. We will follow along with her progress. She is counseled that if her symptoms progressively worsen in an acute setting, she needs to go to the ED. She is in agreement.       Carola Rhine, PAC

## 2018-10-21 NOTE — Progress Notes (Signed)
@Patient  ID: Pamela Russell, female    DOB: [birth year], 73 y.o.   MRN: [id number]  Chief Complaint  Patient presents with  . Shortness of Breath    with wheezing, has worsened since completing radiation treatment about 6 weeks ago.    Referring provider: [place] Bing, DO  HPI 73 year old female active smoker with history of COPD, asthma, Stage IA2, cT1bN0M0 NSCLC, Adenocarcinoma of the RUL and Putative Stage IA2, T1bN0M0 NSCLC of the LUL  followed by Dr. Vaughan Browner.   Tests/recent events:  09/03/2018-09/12/2018 SBRT: The RUL tumorreceived 7 Gyin 3 fractions at 36 Gyper fraction.  The LUL tumor received 50 Gy in 5 fractions at 10 Gy per fraction.  OV 10/21/18 - acute chest congestion and shortness of breath Patient presents today for an acute visit.  She states that over the past 6 weeks she has been experiencing shortness of breath.  States that she experiences shortness of breath more with exertion.  Symptoms have progressively worsened over the past week.  She also complains today of cough that is productive of white sputum and chest congestion.  She has been wheezing.  No recent fever.  She has not been seen in our office since November 2019.  At her last visit with me she was advised to start [place] after she failed Anoro due to nausea.  Patient states that she never started the [place].  She has only been using albuterol as needed.  She has been having to use her albuterol more frequently over the past week.  Her O2 sats in the office today are 100% on room air.  She is afebrile in the office today.  Her pulse rate is normal.  Patient states that she has recently quit smoking.   Allergies  Allergen Reactions  . Codeine Anaphylaxis and Swelling  . Penicillins Anaphylaxis and Swelling    Childhood reaction. Has patient had a PCN reaction causing immediate rash, facial/tongue/throat swelling, SOB or lightheadedness with hypotension: Yes Has patient had a PCN reaction causing  severe rash involving mucus membranes or skin necrosis: No Has patient had a PCN reaction that required hospitalization: No Has patient had a PCN reaction occurring within the last 10 years: No If all of the above answers are "NO", then may proceed with Cephalosporin use.   Celedonio Miyamoto [Umeclidinium-Vilanterol] Nausea And Vomiting    severe    Immunization History  Administered Date(s) Administered  . Influenza,inj,Quad PF,6+ Mos 05/27/2017  . Influenza-Unspecified 05/20/2013, 06/08/2016  . Pneumococcal Conjugate-13 04/30/2014  . Pneumococcal-Unspecified 07/01/2014  . Tdap 07/23/2016  . Zoster 05/12/2013    Past Medical History:  Diagnosis Date  . Allergic rhinitis   . Asthma 2008  . Chronic obstructive pulmonary disease (COPD) ([place]) 06/08/2016   Meds: Spiriva, albuterol inhaler PFT (11/29/2016): Moderate-severe obstruction by PFT 11/2016  . Ear itching 07/05/2017  . Grieving 06/08/2016  . History of pneumonia    x4  . Hyperlipidemia 09/10/2016   On atorvastatin 40 mg QD, ASA 81 mg QD ASCVD risk 17.1%, mod-high intensity statin Risk factors: FHx sig for MI: mother 48s-50s, and father age 60, HTN, smoker  . Hypertension   . Night sweats 06/08/2016  . Pulmonary nodules 06/12/2017  . Tobacco use disorder 07/06/2016   On nicotine replacement patch. Smoking cigs since age 41. Has 60 pack year history. Has COPD dx on SABA and LAMA.    Tobacco History: Social History   Tobacco Use  Smoking Status Current Every Day Smoker  .  Packs/day: 0.50  . Years: 60.00  . Pack years: 30.00  . Types: Cigarettes  . Start date: 08/21/1955  Smokeless Tobacco Never Used  Tobacco Comment   3 ppd for 20+ years, has cut down to 0.5 ppd in the past 3 months   Ready to quit: Not Answered Counseling given: Not Answered Comment: 3 ppd for 20+ years, has cut down to 0.5 ppd in the past 3 months   Outpatient Encounter Medications as of 10/21/2018  Medication Sig  . albuterol (PROAIR HFA) 108 (90  Base) MCG/ACT inhaler Inhale 2 puffs into the lungs every 6 (six) hours as needed for wheezing or shortness of breath.  . ALPRAZolam (XANAX) 0.25 MG tablet Take 1 tablet prior to MRI procedure. (Patient not taking: Reported on 10/21/2018)  . amLODipine (NORVASC) 5 MG tablet TAKE 1 TABLET BY MOUTH  DAILY  . aspirin EC 81 MG tablet Take 81 mg by mouth daily.  Marland Kitchen atorvastatin (LIPITOR) 40 MG tablet TAKE 1 TABLET BY MOUTH  DAILY  . cetirizine (ZYRTEC) 10 MG tablet Take 10 mg by mouth daily.  Marland Kitchen doxycycline (VIBRA-TABS) 100 MG tablet Take 1 tablet (100 mg total) by mouth 2 (two) times daily.  Marland Kitchen glucosamine-chondroitin 500-400 MG tablet Take 1 tablet by mouth daily.   Marland Kitchen guaifenesin (HUMIBID E) 400 MG TABS tablet Take 400 mg by mouth daily.   . hydrochlorothiazide (HYDRODIURIL) 25 MG tablet TAKE 1 TABLET BY MOUTH  DAILY  . mometasone (ELOCON) 0.1 % lotion Apply 1 application topically daily.  . Multiple Vitamin (MULTIVITAMIN WITH MINERALS) TABS tablet Take 1 tablet by mouth daily.  . predniSONE (DELTASONE) 10 MG tablet Take 4 tabs for 2 days, then 3 tabs for 2 days, then 2 tabs for 2 days, then 1 tab for 2 days, then stop  . Tiotropium Bromide-Olodaterol (STIOLTO RESPIMAT) 2.5-2.5 MCG/ACT AERS Inhale 2 puffs into the lungs daily.   Facility-Administered Encounter Medications as of 10/21/2018  Medication  . [COMPLETED] levalbuterol (XOPENEX) nebulizer solution 0.63 mg  . methylPREDNISolone acetate (DEPO-MEDROL) injection 80 mg     Review of Systems  Review of Systems  Constitutional: Negative.  Negative for chills and fever.  HENT: Negative.   Respiratory: Positive for cough, shortness of breath and wheezing.   Cardiovascular: Negative.  Negative for chest pain, palpitations and leg swelling.  Gastrointestinal: Negative.   Allergic/Immunologic: Negative.   Neurological: Negative.   Psychiatric/Behavioral: Negative.        Physical Exam  BP (!) 144/64 (BP Location: Right Arm, Patient  Position: Sitting, Cuff Size: Normal)   Pulse 95   Temp 97.9 F (36.6 C)   Ht 5\' 4"  (1.626 m)   Wt 110 lb 3.2 oz (50 kg)   SpO2 100%   BMI 18.92 kg/m   Wt Readings from Last 5 Encounters:  10/21/18 110 lb 3.2 oz (50 kg)  10/21/18 108 lb 3.2 oz (49.1 kg)  07/31/18 106 lb 4.8 oz (48.2 kg)  07/31/18 106 lb 4.8 oz (48.2 kg)  07/14/18 106 lb (48.1 kg)     Physical Exam Vitals signs and nursing note reviewed.  Constitutional:      General: She is not in acute distress.    Appearance: She is well-developed.  Cardiovascular:     Rate and Rhythm: Normal rate and regular rhythm.  Pulmonary:     Effort: Pulmonary effort is normal.     Breath sounds: Examination of the right-lower field reveals rhonchi. Examination of the left-lower field reveals rhonchi. Wheezing  and rhonchi present.  Musculoskeletal:     Right lower leg: No edema.  Neurological:     Mental Status: She is alert and oriented to person, place, and time.       Assessment & Plan:   COPD with acute exacerbation [facility]) Patient presents today for an acute visit for shortness of breath, cough, and chest congestion.  She has not been using her maintenance inhaler since November.  Her shortness of breath has been becoming progressively worsening over the past 6 to 8 weeks.  She has been using albuterol.  We will check a chest x-ray today in office.  She was given a Xopenex treatment and Depo-Medrol injection in office today.  We will have her come back for close follow-up.  Patient Instructions  Will trial stiolto - take 2 puffs daily Continue albuterol as needed every 6 hours May take mucinex twice daily Will order chest x ray and call with results xopenex given in office today DepoMedrol given in office today Will order prednisone taper to start tomorrow Will order doxycycline  Follow up: Follow up with Me in 1-2 weeks or sooner if needed        Fenton Foy, NP 10/21/2018

## 2018-10-21 NOTE — Assessment & Plan Note (Addendum)
Patient presents today for an acute visit for shortness of breath, cough, and chest congestion.  She has not been using her maintenance inhaler since November.  Her shortness of breath has been becoming progressively worsening over the past 6 to 8 weeks.  She has been using albuterol.  We will check a chest x-ray today in office.  She was given a Xopenex treatment and Depo-Medrol injection in office today.  We will have her come back for close follow-up.  Patient Instructions  Will trial stiolto - take 2 puffs daily Continue albuterol as needed every 6 hours May take mucinex twice daily Will order chest x ray and call with results xopenex given in office today DepoMedrol given in office today Will order prednisone taper to start tomorrow Will order doxycycline  Follow up: Follow up with Me in 1-2 weeks or sooner if needed

## 2018-10-21 NOTE — Patient Instructions (Addendum)
Will trial stiolto - take 2 puffs daily Continue albuterol as needed every 6 hours May take mucinex twice daily Will order chest x ray and call with results xopenex given in office today DepoMedrol given in office today Will order prednisone taper to start tomorrow Will order doxycycline  Follow up: Follow up with Me in 1-2 weeks or sooner if needed

## 2018-11-04 ENCOUNTER — Ambulatory Visit: Payer: Medicare Other | Admitting: Nurse Practitioner

## 2018-11-13 ENCOUNTER — Other Ambulatory Visit: Payer: Self-pay

## 2018-11-13 ENCOUNTER — Encounter: Payer: Self-pay | Admitting: Nurse Practitioner

## 2018-11-13 ENCOUNTER — Ambulatory Visit: Payer: Medicare Other | Admitting: Nurse Practitioner

## 2018-11-13 DIAGNOSIS — J449 Chronic obstructive pulmonary disease, unspecified: Secondary | ICD-10-CM

## 2018-11-13 MED ORDER — GLYCOPYRROLATE-FORMOTEROL 9-4.8 MCG/ACT IN AERO: 2.0000 | INHALATION_SPRAY | Freq: Two times a day (BID) | RESPIRATORY_TRACT | 0 refills | Status: DC

## 2018-11-13 MED ORDER — GLYCOPYRROLATE-FORMOTEROL 9-4.8 MCG/ACT IN AERO: 2.0000 | INHALATION_SPRAY | Freq: Two times a day (BID) | RESPIRATORY_TRACT | 5 refills | Status: DC

## 2018-11-13 NOTE — Progress Notes (Signed)
@Patient  ID: Pamela Russell, female    DOB: 05-01-1946, 73 y.o.   MRN: [id number]  Chief Complaint  Patient presents with  . 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 Vaughan Browner.   Tests/recent events: CXR  10/21/18 - Moderate to severe changes of acute bronchitis and/or asthma without focal airspace pneumonia. MEDIAL LEFT UPPER LOBE lung nodule identified on prior CT is inconspicuous on chest x-ray. 09/03/2018-01/24/2020SBRT: The RUL tumorreceived 54 Gyin 3 fractions at 18 Gyper fraction.  The LUL tumor received 50 Gy in 5 fractions at 10 Gy per fraction.  OV 11/13/18 - follow up Patient presents today for follow-up visit.  She was last seen by me on 10/21/2018 with COPD exacerbation.  Her last visit she was started on Stiolto, Mucinex, prednisone taper, and doxycycline.  She states that she is much improved.  She is much less short of breath.  She feels overall better.  She states that she does not feel that Stiolto is helping her.  Would like to try different inhaler.  She has also tried and failed Anoro due to nausea.  Patient states that she has no longer smoking.  Denies f/c/s, n/v/d, hemoptysis, PND, leg swelling.     Allergies  Allergen Reactions  . Codeine Anaphylaxis and Swelling  . Penicillins Anaphylaxis and Swelling    Childhood reaction. Has patient had a PCN reaction causing immediate rash, facial/tongue/throat swelling, SOB or lightheadedness with hypotension: Yes Has patient had a PCN reaction causing severe rash involving mucus membranes or skin necrosis: No Has patient had a PCN reaction that required hospitalization: No Has patient had a PCN reaction occurring within the last 10 years: No If all of the above answers are "NO", then may proceed with  Cephalosporin use.   Celedonio Miyamoto [Umeclidinium-Vilanterol] Nausea And Vomiting    severe    Immunization History  Administered Date(s) Administered  . Influenza,inj,Quad PF,6+ Mos 05/27/2017  . Influenza-Unspecified 05/20/2013, 06/08/2016  . Pneumococcal Conjugate-13 04/30/2014  . Pneumococcal-Unspecified 07/01/2014  . Tdap 07/23/2016  . Zoster 05/12/2013    Past Medical History:  Diagnosis Date  . Allergic rhinitis   . Asthma 2008  . Chronic obstructive pulmonary disease (COPD) ([place]) 06/08/2016   Meds: Spiriva, albuterol inhaler PFT (11/29/2016): Moderate-severe obstruction by PFT 11/2016  . Ear itching 07/05/2017  . Grieving 06/08/2016  . History of pneumonia    x4  . Hyperlipidemia 09/10/2016   On atorvastatin 40 mg QD, ASA 81 mg QD ASCVD risk 17.1%, mod-high intensity statin Risk factors: FHx sig for MI: mother 34s-50s, and father age 48, HTN, smoker  . Hypertension   . Night sweats 06/08/2016  . Pulmonary nodules 06/12/2017  . Tobacco use disorder 07/06/2016   On nicotine replacement patch. Smoking cigs since age 59. Has 60 pack year history. Has COPD dx on SABA and LAMA.    Tobacco History: Social History   Tobacco Use  Smoking Status Current Every Day Smoker  . Packs/day: 0.50  . Years: 60.00  . Pack years: 30.00  . Types: Cigarettes  . Start date: 08/21/1955  Smokeless Tobacco Never Used  Tobacco Comment   3 ppd for 20+ years, has cut down to 0.5 ppd in the past 3 months   Ready to quit: Yes  Counseling given: Yes Comment: 3 ppd for 20+ years, has cut down to 0.5 ppd in the past 3 months   Outpatient Encounter Medications as of 11/13/2018  Medication Sig  . albuterol (PROAIR HFA) 108 (90 Base) MCG/ACT inhaler Inhale 2 puffs into the lungs every 6 (six) hours as needed for wheezing or shortness of breath.  . ALPRAZolam (XANAX) 0.25 MG tablet Take 1 tablet prior to MRI procedure.  Marland Kitchen amLODipine (NORVASC) 5 MG tablet TAKE 1 TABLET BY MOUTH  DAILY  . aspirin  EC 81 MG tablet Take 81 mg by mouth daily.  Marland Kitchen atorvastatin (LIPITOR) 40 MG tablet TAKE 1 TABLET BY MOUTH  DAILY  . cetirizine (ZYRTEC) 10 MG tablet Take 10 mg by mouth daily.  Marland Kitchen glucosamine-chondroitin 500-400 MG tablet Take 1 tablet by mouth daily.   Marland Kitchen guaifenesin (HUMIBID E) 400 MG TABS tablet Take 400 mg by mouth daily.   . hydrochlorothiazide (HYDRODIURIL) 25 MG tablet TAKE 1 TABLET BY MOUTH  DAILY  . mometasone (ELOCON) 0.1 % lotion Apply 1 application topically daily.  . Multiple Vitamin (MULTIVITAMIN WITH MINERALS) TABS tablet Take 1 tablet by mouth daily.  . [DISCONTINUED] Tiotropium Bromide-Olodaterol (STIOLTO RESPIMAT) 2.5-2.5 MCG/ACT AERS Inhale 2 puffs into the lungs daily.  . Glycopyrrolate-Formoterol (BEVESPI AEROSPHERE) 9-4.8 MCG/ACT AERO Inhale 2 puffs into the lungs 2 (two) times daily.  . [DISCONTINUED] doxycycline (VIBRA-TABS) 100 MG tablet Take 1 tablet (100 mg total) by mouth 2 (two) times daily. (Patient not taking: Reported on 11/13/2018)  . [DISCONTINUED] Glycopyrrolate-Formoterol (BEVESPI AEROSPHERE) 9-4.8 MCG/ACT AERO Inhale 2 puffs into the lungs 2 (two) times daily.  . [DISCONTINUED] predniSONE (DELTASONE) 10 MG tablet Take 4 tabs for 2 days, then 3 tabs for 2 days, then 2 tabs for 2 days, then 1 tab for 2 days, then stop (Patient not taking: Reported on 11/13/2018)   No facility-administered encounter medications on file as of 11/13/2018.      Review of Systems  Review of Systems  Constitutional: Negative.  Negative for chills and fever.  HENT: Negative.   Respiratory: Negative for cough, shortness of breath and wheezing.   Cardiovascular: Negative.  Negative for chest pain, palpitations and leg swelling.  Gastrointestinal: Negative.   Allergic/Immunologic: Negative.   Neurological: Negative.   Psychiatric/Behavioral: Negative.        Physical Exam  BP 136/68 (BP Location: Right Arm, Patient Position: Sitting, Cuff Size: Normal)   Pulse (!) 107   Temp  98.5 F (36.9 C)   Ht 5\' 4"  (1.626 m)   Wt 106 lb (48.1 kg)   SpO2 95%   BMI 18.19 kg/m   Wt Readings from Last 5 Encounters:  11/13/18 106 lb (48.1 kg)  10/21/18 110 lb 3.2 oz (50 kg)  10/21/18 108 lb 3.2 oz (49.1 kg)  07/31/18 106 lb 4.8 oz (48.2 kg)  07/31/18 106 lb 4.8 oz (48.2 kg)     Physical Exam Vitals signs and nursing note reviewed.  Constitutional:      General: She is not in acute distress.    Appearance: She is well-developed.  Cardiovascular:     Rate and Rhythm: Normal rate and regular rhythm.  Pulmonary:     Effort: Pulmonary effort is normal. No respiratory distress.     Breath sounds: Normal breath sounds. No wheezing or rhonchi.  Musculoskeletal:        General: No swelling.  Neurological:     Mental Status: She is alert and oriented to person, place, and time.  Imaging: Dg Chest 2 View  Result Date: 10/21/2018 CLINICAL DATA:  COPD exacerbation with an approximate 3-4 week history of intermittent cough and progressively worsening shortness of breath. EXAM: CHEST - 2 VIEW COMPARISON:  07/09/2018 and earlier, including CT chest 07/01/2018 and earlier. FINDINGS: Cardiac silhouette normal in size, unchanged. Thoracic aorta atherosclerotic, unchanged. Prominent central pulmonary arteries, unchanged. Hilar and mediastinal contours otherwise unremarkable. Nodule involving the MEDIAL LEFT UPPER LOBE identified on the prior CT is inconspicuous on x-ray. Stable biapical pleuroparenchymal scarring with associated calcifications. Prominent bronchovascular markings diffusely and moderate to marked central peribronchial thickening, more so than on prior examinations. Lungs otherwise clear. No localized airspace consolidation. No pleural effusions. No pneumothorax. Normal pulmonary vascularity. Visualized bony thorax intact. IMPRESSION: 1. Moderate to severe changes of acute bronchitis and/or asthma without focal airspace pneumonia. 2. MEDIAL LEFT UPPER LOBE lung nodule  identified on prior CT is inconspicuous on chest x-ray. Electronically Signed   By: Evangeline Dakin M.D.   On: 10/21/2018 11:21     Assessment & Plan:   Chronic obstructive pulmonary disease (COPD) ([place]) Patient presents today for follow-up visit.  She was last seen by me on 10/21/2018 with COPD exacerbation.  Her last visit she was started on Stiolto, Mucinex, prednisone taper, and doxycycline.  She states that she is much improved.  She is much less short of breath.  She feels overall better.  She states that she does not feel that Stiolto is helping her.  Would like to try different inhaler.   Patient Instructions  Glad you are doing better!!! Will trial bevespi Continue albuterol as needed every 6 hours May take mucinex twice daily  Follow up with Dr. Vaughan Browner in 4 months or sooner if needed  Coronavirus (COVID-19) Are you at risk?  Are you at risk for the Coronavirus (COVID-19)?  To be considered HIGH RISK for Coronavirus (COVID-19), you have to meet the following criteria:  . Traveled to [place], [place], [place], [place] or [place]; or in the [place] to [place], [place], [place], or [place]; and have fever, cough, and shortness of breath within the last 2 weeks of travel OR . Been in close contact with a person diagnosed with COVID-19 within the last 2 weeks and have fever, cough, and shortness of breath . IF YOU DO NOT MEET THESE CRITERIA, YOU ARE CONSIDERED LOW RISK FOR COVID-19.  What to do if you are HIGH RISK for COVID-19?  Marland Kitchen If you are having a medical emergency, call 911. . Seek medical care right away. Before you go to a doctor's office, urgent care or emergency department, call ahead and tell them about your recent travel, contact with someone diagnosed with COVID-19, and your symptoms. You should receive instructions from your physician's office regarding next steps of care.  . When you arrive at healthcare provider, tell the healthcare staff immediately you  have returned from visiting [place], [place], [place], [place] or [place]; or traveled in the [place] to [place], [place], [place], or [place]; in the last two weeks or you have been in close contact with a person diagnosed with COVID-19 in the last 2 weeks.   . Tell the health care staff about your symptoms: fever, cough and shortness of breath. . After you have been seen by a medical provider, you will be either: o Tested for (COVID-19) and discharged home on quarantine except to seek medical care if symptoms worsen, and asked to  - Stay home  and avoid contact with others until you get your results (4-5 days)  - Avoid travel on public transportation if possible (such as bus, train, or airplane) or o Sent to the Emergency Department by EMS for evaluation, COVID-19 testing, and possible admission depending on your condition and test results.  What to do if you are LOW RISK for COVID-19?  Reduce your risk of any infection by using the same precautions used for avoiding the common cold or flu:  Marland Kitchen Wash your hands often with soap and warm water for at least 20 seconds.  If soap and water are not readily available, use an alcohol-based hand sanitizer with at least 60% alcohol.  . If coughing or sneezing, cover your mouth and nose by coughing or sneezing into the elbow areas of your shirt or coat, into a tissue or into your sleeve (not your hands). . Avoid shaking hands with others and consider head nods or verbal greetings only. . Avoid touching your eyes, nose, or mouth with unwashed hands.  . Avoid close contact with people who are sick. . Avoid places or events with large numbers of people in one location, like concerts or sporting events. . Carefully consider travel plans you have or are making. . If you are planning any travel outside or inside the [place], visit the CDC's Travelers' Health webpage for the latest health notices. . If you have some symptoms but not all symptoms, continue to  monitor at home and seek medical attention if your symptoms worsen. . If you are having a medical emergency, call 911.   [place] / e-Visit: [email or web address]         [facility]: [facility]: [phone]                   [facility]: [phone]         Fenton Foy, NP 11/14/2018

## 2018-11-13 NOTE — Patient Instructions (Addendum)
Glad you are doing better!!! Will trial bevespi Continue albuterol as needed every 6 hours May take mucinex twice daily  Follow up with Dr. Vaughan Browner in 4 months or sooner if needed  Coronavirus (COVID-19) Are you at risk?  Are you at risk for the Coronavirus (COVID-19)?  To be considered HIGH RISK for Coronavirus (COVID-19), you have to meet the following criteria:  . Traveled to [place], [place], [place], [place] or [place]; or in the [place] to [place], [place], [place], or [place]; and have fever, cough, and shortness of breath within the last 2 weeks of travel OR . Been in close contact with a person diagnosed with COVID-19 within the last 2 weeks and have fever, cough, and shortness of breath . IF YOU DO NOT MEET THESE CRITERIA, YOU ARE CONSIDERED LOW RISK FOR COVID-19.  What to do if you are HIGH RISK for COVID-19?  Marland Kitchen If you are having a medical emergency, call 911. . Seek medical care right away. Before you go to a doctor's office, urgent care or emergency department, call ahead and tell them about your recent travel, contact with someone diagnosed with COVID-19, and your symptoms. You should receive instructions from your physician's office regarding next steps of care.  . When you arrive at healthcare provider, tell the healthcare staff immediately you have returned from visiting [place], [place], [place], [place] or [place]; or traveled in the [place] to [place], [place], [place], or [place]; in the last two weeks or you have been in close contact with a person diagnosed with COVID-19 in the last 2 weeks.   . Tell the health care staff about your symptoms: fever, cough and shortness of breath. . After you have been seen by a medical provider, you will be either: o Tested for (COVID-19) and discharged home on quarantine except to seek medical care if symptoms worsen, and asked to  - Stay home and avoid contact with others until you get your results (4-5 days)   - Avoid travel on public transportation if possible (such as bus, train, or airplane) or o Sent to the Emergency Department by EMS for evaluation, COVID-19 testing, and possible admission depending on your condition and test results.  What to do if you are LOW RISK for COVID-19?  Reduce your risk of any infection by using the same precautions used for avoiding the common cold or flu:  Marland Kitchen Wash your hands often with soap and warm water for at least 20 seconds.  If soap and water are not readily available, use an alcohol-based hand sanitizer with at least 60% alcohol.  . If coughing or sneezing, cover your mouth and nose by coughing or sneezing into the elbow areas of your shirt or coat, into a tissue or into your sleeve (not your hands). . Avoid shaking hands with others and consider head nods or verbal greetings only. . Avoid touching your eyes, nose, or mouth with unwashed hands.  . Avoid close contact with people who are sick. . Avoid places or events with large numbers of people in one location, like concerts or sporting events. . Carefully consider travel plans you have or are making. . If you are planning any travel outside or inside the [place], visit the CDC's Travelers' Health webpage for the latest health notices. . If you have some symptoms but not all symptoms, continue to monitor at home and seek medical attention if your symptoms worsen. . If you are having a  medical emergency, call 911.   [place] / e-Visit: [email or web address]         [facility]: [facility]: [phone]                   [facility]: [phone]

## 2018-11-14 ENCOUNTER — Encounter: Payer: Self-pay | Admitting: Nurse Practitioner

## 2018-11-14 NOTE — Assessment & Plan Note (Signed)
Patient presents today for follow-up visit.  She was last seen by me on 10/21/2018 with COPD exacerbation.  Her last visit she was started on Stiolto, Mucinex, prednisone taper, and doxycycline.  She states that she is much improved.  She is much less short of breath.  She feels overall better.  She states that she does not feel that Stiolto is helping her.  Would like to try different inhaler.   Patient Instructions  Glad you are doing better!!! Will trial bevespi Continue albuterol as needed every 6 hours May take mucinex twice daily  Follow up with Dr. Vaughan Browner in 4 months or sooner if needed  Coronavirus (COVID-19) Are you at risk?  Are you at risk for the Coronavirus (COVID-19)?  To be considered HIGH RISK for Coronavirus (COVID-19), you have to meet the following criteria:  . Traveled to [place], [place], [place], [place] or [place]; or in the [place] to [place], [place], [place], or [place]; and have fever, cough, and shortness of breath within the last 2 weeks of travel OR . Been in close contact with a person diagnosed with COVID-19 within the last 2 weeks and have fever, cough, and shortness of breath . IF YOU DO NOT MEET THESE CRITERIA, YOU ARE CONSIDERED LOW RISK FOR COVID-19.  What to do if you are HIGH RISK for COVID-19?  Marland Kitchen If you are having a medical emergency, call 911. . Seek medical care right away. Before you go to a doctor's office, urgent care or emergency department, call ahead and tell them about your recent travel, contact with someone diagnosed with COVID-19, and your symptoms. You should receive instructions from your physician's office regarding next steps of care.  . When you arrive at healthcare provider, tell the healthcare staff immediately you have returned from visiting [place], [place], [place], [place] or [place]; or traveled in the [place] to [place], [place], [place], or [place]; in the last two weeks or you have been in close contact  with a person diagnosed with COVID-19 in the last 2 weeks.   . Tell the health care staff about your symptoms: fever, cough and shortness of breath. . After you have been seen by a medical provider, you will be either: o Tested for (COVID-19) and discharged home on quarantine except to seek medical care if symptoms worsen, and asked to  - Stay home and avoid contact with others until you get your results (4-5 days)  - Avoid travel on public transportation if possible (such as bus, train, or airplane) or o Sent to the Emergency Department by EMS for evaluation, COVID-19 testing, and possible admission depending on your condition and test results.  What to do if you are LOW RISK for COVID-19?  Reduce your risk of any infection by using the same precautions used for avoiding the common cold or flu:  Marland Kitchen Wash your hands often with soap and warm water for at least 20 seconds.  If soap and water are not readily available, use an alcohol-based hand sanitizer with at least 60% alcohol.  . If coughing or sneezing, cover your mouth and nose by coughing or sneezing into the elbow areas of your shirt or coat, into a tissue or into your sleeve (not your hands). . Avoid shaking hands with others and consider head nods or verbal greetings only. . Avoid touching your eyes, nose, or mouth with unwashed hands.  . Avoid close contact with people who are sick. . Avoid  places or events with large numbers of people in one location, like concerts or sporting events. . Carefully consider travel plans you have or are making. . If you are planning any travel outside or inside the [place], visit the CDC's Travelers' Health webpage for the latest health notices. . If you have some symptoms but not all symptoms, continue to monitor at home and seek medical attention if your symptoms worsen. . If you are having a medical emergency, call 911.   [place] / e-Visit:  [email or web address]         [facility]: [facility]: [phone]                   [facility]: [phone]

## 2018-12-17 ENCOUNTER — Telehealth: Payer: Self-pay | Admitting: Pulmonary Disease

## 2018-12-17 ENCOUNTER — Telehealth: Payer: Self-pay | Admitting: Cardiovascular Disease

## 2018-12-17 MED ORDER — GLYCOPYRROLATE-FORMOTEROL 9-4.8 MCG/ACT IN AERO: 2.0000 | INHALATION_SPRAY | Freq: Two times a day (BID) | RESPIRATORY_TRACT | 2 refills | Status: DC

## 2018-12-17 MED ORDER — ALBUTEROL SULFATE HFA 108 (90 BASE) MCG/ACT IN AERS: 2.0000 | INHALATION_SPRAY | Freq: Four times a day (QID) | RESPIRATORY_TRACT | 1 refills | Status: DC | PRN

## 2018-12-17 NOTE — Telephone Encounter (Signed)
New message     Virtual video visit scheduled for 12-22-18.  Pt gave consent for appt.  YOUR CARDIOLOGY TEAM HAS ARRANGED FOR AN E-VISIT FOR YOUR APPOINTMENT - PLEASE REVIEW IMPORTANT INFORMATION BELOW SEVERAL DAYS PRIOR TO YOUR APPOINTMENT  Due to the recent COVID-19 pandemic, we are transitioning in-person office visits to tele-medicine visits in an effort to decrease unnecessary exposure to our patients, their families, and staff. These visits are billed to your insurance just like a normal visit is. We also encourage you to sign up for MyChart if you have not already done so. You will need a smartphone if possible. For patients that do not have this, we can still complete the visit using a regular telephone but do prefer a smartphone to enable video when possible. You may have a family member that lives with you that can help. If possible, we also ask that you have a blood pressure cuff and scale at home to measure your blood pressure, heart rate and weight prior to your scheduled appointment. Patients with clinical needs that need an in-person evaluation and testing will still be able to come to the office if absolutely necessary. If you have any questions, feel free to call our office.     YOUR PROVIDER WILL BE USING THE FOLLOWING PLATFORM TO COMPLETE YOUR VISIT: Doxy Me   IF USING MYCHART - How to Download the MyChart App to Your SmartPhone   - If Apple, go to [employer] and type in MyChart in the search bar and download the app. If Android, ask patient to go to [employer] and type in [place] in the search bar and download the app. The app is free but as with any other app downloads, your phone may require you to verify saved payment information or Apple/Android password.  - You will need to then log into the app with your MyChart username and password, and select [facility] as your healthcare provider to link the account.  - When it is time for your visit, go to the MyChart app,  find appointments, and click Begin Video Visit. Be sure to Select Allow for your device to access the Microphone and Camera for your visit. You will then be connected, and your provider will be with you shortly.  **If you have any issues connecting or need assistance, please contact MyChart service desk [phone])**  **If using a computer, in order to ensure the best quality for your visit, you will need to use either of the following Internet Browsers: Insurance underwriter or Microsoft Edge**   IF USING DOXIMITY or [email or web address] - The staff will give you instructions on receiving your link to join the meeting the day of your visit.      2-3 DAYS BEFORE YOUR APPOINTMENT  You will receive a telephone call from one of our [facility] team members - your caller ID may say "Unknown caller." If this is a video visit, we will walk you through how to get the video launched on your phone. We will remind you check your blood pressure, heart rate and weight prior to your scheduled appointment. If you have an Apple Watch or Kardia, please upload any pertinent ECG strips the day before or morning of your appointment to [place]. Our staff will also make sure you have reviewed the consent and agree to move forward with your scheduled tele-health visit.     THE DAY OF YOUR APPOINTMENT  Approximately 15 minutes prior to your scheduled appointment,  you will receive a telephone call from one of [facility] team - your caller ID may say "Unknown caller."  Our staff will confirm medications, vital signs for the day and any symptoms you may be experiencing. Please have this information available prior to the time of visit start. It may also be helpful for you to have a pad of paper and pen handy for any instructions given during your visit. They will also walk you through joining the smartphone meeting if this is a video visit.    CONSENT FOR TELE-HEALTH VISIT - PLEASE REVIEW  I hereby voluntarily request,  consent and authorize [facility] and its employed or contracted physicians, physician assistants, nurse practitioners or other licensed health care professionals (the Practitioner), to provide me with telemedicine health care services (the Services") as deemed necessary by the treating Practitioner. I acknowledge and consent to receive the Services by the Practitioner via telemedicine. I understand that the telemedicine visit will involve communicating with the Practitioner through live audiovisual communication technology and the disclosure of certain medical information by electronic transmission. I acknowledge that I have been given the opportunity to request an in-person assessment or other available alternative prior to the telemedicine visit and am voluntarily participating in the telemedicine visit.  I understand that I have the right to withhold or withdraw my consent to the use of telemedicine in the course of my care at any time, without affecting my right to future care or treatment, and that the Practitioner or I may terminate the telemedicine visit at any time. I understand that I have the right to inspect all information obtained and/or recorded in the course of the telemedicine visit and may receive copies of available information for a reasonable fee.  I understand that some of the potential risks of receiving the Services via telemedicine include:   Delay or interruption in medical evaluation due to technological equipment failure or disruption;  Information transmitted may not be sufficient (e.g. poor resolution of images) to allow for appropriate medical decision making by the Practitioner; and/or   In rare instances, security protocols could fail, causing a breach of personal health information.  Furthermore, I acknowledge that it is my responsibility to provide information about my medical history, conditions and care that is complete and accurate to the best of my ability. I  acknowledge that Practitioner's advice, recommendations, and/or decision may be based on factors not within their control, such as incomplete or inaccurate data provided by me or distortions of diagnostic images or specimens that may result from electronic transmissions. I understand that the practice of medicine is not an exact science and that Practitioner makes no warranties or guarantees regarding treatment outcomes. I acknowledge that I will receive a copy of this consent concurrently upon execution via email to the email address I last provided but may also request a printed copy by calling the office of [facility].    I understand that my insurance will be billed for this visit.   I have read or had this consent read to me.  I understand the contents of this consent, which adequately explains the benefits and risks of the Services being provided via telemedicine.   I have been provided ample opportunity to ask questions regarding this consent and the Services and have had my questions answered to my satisfaction.  I give my informed consent for the services to be provided through the use of telemedicine in my medical care  By participating in this telemedicine visit  I agree to the above.

## 2018-12-17 NOTE — Telephone Encounter (Signed)
Called patient made aware rx refill proair submitted to [facility].

## 2018-12-17 NOTE — Addendum Note (Signed)
Addended by: Stephanie Coup on: 12/17/2018 05:02 PM   Modules accepted: Orders

## 2018-12-20 NOTE — Progress Notes (Signed)
Virtual Visit via Video Note   This visit type was conducted due to national recommendations for restrictions regarding the COVID-19 Pandemic (e.g. social distancing) in an effort to limit this patient's exposure and mitigate transmission in our community.  Due to her co-morbid illnesses, this patient is at least at moderate risk for complications without adequate follow up.  This format is felt to be most appropriate for this patient at this time.  All issues noted in this document were discussed and addressed.  A limited physical exam was performed with this format.  Please refer to the patient's chart for her consent to telehealth for [facility].   Date:  12/22/2018   ID:  Pamela Russell, Pamela Russell 12-03-1945, MRN [id number]  Patient Location: Home Provider Location: Office  PCP:  Borup Bing, DO  Cardiologist:  Jenkins Rouge, MD   Electrophysiologist:  None   Evaluation Performed:  New Patient  Chief Complaint:  Dyspnea  History of Present Illness:    Pamela Russell is a 73 y.o. female with COPD and multifocal lung cancer referred by pulmonary Dr Ovidio Hanger for dyspnea and risk CAD. Last seen by Dr Martinique in 2014 with chest pain with normal myouve Has smoked up to 3 ppd since age 29 Very poor functional status Less than a block and unable to go up full flight of steps FEV1 less than 57% Seen by Roxan Hockey and did not think she could tolerate bilateral upper lobectomies.  Referred to oncology for XRT and Rx bilateral lung adenocarcinoma  Last myovue done 01/22/18 for possible lobectomy Dr Roxan Hockey was normal with no ischemia and EF 72% Echo 01/22/18 EF 60-65% no valve disease Carotid 01/20/18 plaque no stenosis   Pathology diagnosis adenocarcinoma confirmed by bronchoscopy/ biopsy Dr Grace Blight 07/08/18 Started XRT January 2020   No cardiac symptoms No chest pain Waiting for COVID restrictions to lift to do f/u CT scan. Not wearing oxygen at home   The patient does not have symptoms  concerning for COVID-19 infection (fever, chills, cough, or new shortness of breath).    Past Medical History:  Diagnosis Date  . Allergic rhinitis   . Asthma 2008  . Chronic obstructive pulmonary disease (COPD) ([place]) 06/08/2016   Meds: Spiriva, albuterol inhaler PFT (11/29/2016): Moderate-severe obstruction by PFT 11/2016  . Ear itching 07/05/2017  . Grieving 06/08/2016  . History of pneumonia    x4  . Hyperlipidemia 09/10/2016   On atorvastatin 40 mg QD, ASA 81 mg QD ASCVD risk 17.1%, mod-high intensity statin Risk factors: FHx sig for MI: mother 76s-50s, and father age 73, HTN, smoker  . Hypertension   . Night sweats 06/08/2016  . Pulmonary nodules 06/12/2017  . Tobacco use disorder 07/06/2016   On nicotine replacement patch. Smoking cigs since age 39. Has 60 pack year history. Has COPD dx on SABA and LAMA.   Past Surgical History:  Procedure Laterality Date  . CATARACT EXTRACTION Left 05/08/2016  . CATARACT EXTRACTION Right 05/22/2016  . TOTAL ABDOMINAL HYSTERECTOMY W/ BILATERAL SALPINGOOPHORECTOMY Bilateral 1975  . VIDEO BRONCHOSCOPY WITH ENDOBRONCHIAL NAVIGATION Left 07/09/2018   Procedure: VIDEO BRONCHOSCOPY WITH ENDOBRONCHIAL NAVIGATION;  Surgeon: Collene Gobble, MD;  Location: [facility] OR;  Service: Thoracic;  Laterality: Left;     Current Meds  Medication Sig  . albuterol (PROAIR HFA) 108 (90 Base) MCG/ACT inhaler Inhale 2 puffs into the lungs every 6 (six) hours as needed for wheezing or shortness of breath.  . ALPRAZolam (XANAX) 0.25 MG tablet Take 1  tablet prior to MRI procedure.  Marland Kitchen amLODipine (NORVASC) 5 MG tablet TAKE 1 TABLET BY MOUTH  DAILY  . aspirin EC 81 MG tablet Take 81 mg by mouth daily.  Marland Kitchen atorvastatin (LIPITOR) 40 MG tablet TAKE 1 TABLET BY MOUTH  DAILY  . cetirizine (ZYRTEC) 10 MG tablet Take 10 mg by mouth daily.  Marland Kitchen glucosamine-chondroitin 500-400 MG tablet Take 1 tablet by mouth daily.   . Glycopyrrolate-Formoterol (BEVESPI AEROSPHERE) 9-4.8 MCG/ACT AERO  Inhale 2 puffs into the lungs 2 (two) times daily.  Marland Kitchen guaifenesin (HUMIBID E) 400 MG TABS tablet Take 400 mg by mouth daily.   . hydrochlorothiazide (HYDRODIURIL) 25 MG tablet TAKE 1 TABLET BY MOUTH  DAILY  . mometasone (ELOCON) 0.1 % lotion Apply 1 application topically daily.  . Multiple Vitamin (MULTIVITAMIN WITH MINERALS) TABS tablet Take 1 tablet by mouth daily.     Allergies:   Codeine; Penicillins; and Anoro ellipta [umeclidinium-vilanterol]   Social History   Tobacco Use  . Smoking status: Current Every Day Smoker    Packs/day: 0.50    Years: 60.00    Pack years: 30.00    Types: Cigarettes    Start date: 08/21/1955  . Smokeless tobacco: Never Used  . Tobacco comment: 3 ppd for 20+ years, has cut down to 0.5 ppd in the past 3 months  Substance Use Topics  . Alcohol use: Yes    Alcohol/week: 1.0 standard drinks    Types: 1 Cans of beer per week    Comment: occ  . Drug use: No     Family Hx: The patient's family history includes Diabetes in her sister; Heart disease in her father and mother; Heart disease (age of onset: 80) in her sister; Hypertension in her father; Stroke in her mother and sister.  ROS:   Please see the history of present illness.     All other systems reviewed and are negative.   Prior CV studies:   The following studies were reviewed today:  Myovue Echo 2019 see HPI  Labs/Other Tests and Data Reviewed:    EKG:  SR rate 93 pulmonary dx pattern bi atrial enlargement 01/15/18  Recent Labs: 07/08/2018: ALT 22; BUN 7; Creatinine, Ser 0.55; Hemoglobin 14.0; Platelets 203; Potassium 3.5; Sodium 134   Recent Lipid Panel Lab Results  Component Value Date/Time   CHOL 239 (H) 06/08/2016 11:28 AM   TRIG 101 06/08/2016 11:28 AM   HDL 82 06/08/2016 11:28 AM   CHOLHDL 2.9 06/08/2016 11:28 AM   LDLCALC 137 (H) 06/08/2016 11:28 AM   LDLDIRECT 55 05/27/2017 02:58 PM    Wt Readings from Last 3 Encounters:  12/22/18 47.6 kg  11/13/18 48.1 kg  10/21/18  50 kg     Objective:    Vital Signs:  BP 136/72   Ht 5\' 4"  (1.626 m)   Wt 47.6 kg   BMI 18.02 kg/m    Chronically ill COPDer Audible wheezing No JVP elevation No tachypnea No edema  Skin warm and dry   ASSESSMENT & PLAN:    1. CAD Risk:  Normal myovue 01/22/18 given prognosis of multilobar adenocarcinoma of lung no further testing indicated 2. Pulmonary:  COPD with long smoking history and now multilobar adenocarcinoma Has finished XRT Rx not thought to be a candidate for bilateral upper lobectomies per Dr Roxan Hockey   3. HTN:  Well controlled.  Continue current medications and low sodium Dash type diet.   4. HLD  On statin labs with primary   COVID-19  Education: The signs and symptoms of COVID-19 were discussed with the patient and how to seek care for testing (follow up with PCP or arrange E-visit).  The importance of social distancing was discussed today.  Time:   Today, I have spent 40 minutes with the patient with telehealth technology discussing the above problems.     Medication Adjustments/Labs and Tests Ordered: Current medicines are reviewed at length with the patient today.  Concerns regarding medicines are outlined above.   Tests Ordered: No orders of the defined types were placed in this encounter.   Medication Changes: No orders of the defined types were placed in this encounter.   Disposition:  Follow up PRN  Signed, Jenkins Rouge, MD  12/22/2018 10:16 AM    [place]

## 2018-12-22 ENCOUNTER — Other Ambulatory Visit: Payer: Self-pay

## 2018-12-22 ENCOUNTER — Telehealth (INDEPENDENT_AMBULATORY_CARE_PROVIDER_SITE_OTHER): Payer: Medicare Other | Admitting: Cardiovascular Disease

## 2018-12-22 ENCOUNTER — Encounter: Payer: Self-pay | Admitting: Cardiovascular Disease

## 2018-12-22 VITALS — BP 136/72 | Ht 64.0 in | Wt 105.0 lb

## 2018-12-22 DIAGNOSIS — R0602 Shortness of breath: Secondary | ICD-10-CM | POA: Diagnosis not present

## 2018-12-22 NOTE — Patient Instructions (Signed)
Medication Instructions:   If you need a refill on your cardiac medications before your next appointment, please call your pharmacy.   Lab work:  If you have labs (blood work) drawn today and your tests are completely normal, you will receive your results only by: Marland Kitchen MyChart Message (if you have MyChart) OR . A paper copy in the mail If you have any lab test that is abnormal or we need to change your treatment, we will call you to review the results.  Testing/Procedures: None ordered today.  Follow-Up: At [facility], you and your health needs are our priority.  As part of our continuing mission to provide you with exceptional heart care, we have created designated Provider Care Teams.  These Care Teams include your primary Cardiologist (physician) and Advanced Practice Providers (APPs -  Physician Assistants and Nurse Practitioners) who all work together to provide you with the care you need, when you need it. . You will need a follow up appointment as needed with Dr. Johnsie Cancel.

## 2019-01-08 ENCOUNTER — Other Ambulatory Visit: Payer: Self-pay | Admitting: *Deleted

## 2019-01-08 DIAGNOSIS — L299 Pruritus, unspecified: Secondary | ICD-10-CM

## 2019-01-08 MED ORDER — MOMETASONE FUROATE 0.1 % EX SOLN: 1.0000 "application " | Freq: Every day | CUTANEOUS | 3 refills | Status: DC

## 2019-01-09 ENCOUNTER — Other Ambulatory Visit: Payer: Self-pay | Admitting: Radiation Oncology

## 2019-01-09 DIAGNOSIS — C3412 Malignant neoplasm of upper lobe, left bronchus or lung: Secondary | ICD-10-CM

## 2019-01-13 ENCOUNTER — Telehealth: Payer: Self-pay | Admitting: *Deleted

## 2019-01-13 NOTE — Telephone Encounter (Signed)
CALLED PATIENT TO INFORM OF STAT LABS ON 01-16-19- @ 12:15 PM @ [facility] CT ON 01-16-19 - ARRIVAL TIME- 1:15 PM @ [facility], PATIENT TO HAVE LIQUIDS ONLY - 4 HRS. PRIOR TO TEST, SPOKE WITH PATIENT AND SHE IS AWARE OF THESE APPTS.

## 2019-01-16 ENCOUNTER — Other Ambulatory Visit: Payer: Self-pay

## 2019-01-16 ENCOUNTER — Ambulatory Visit
Admission: RE | Admit: 2019-01-16 | Discharge: 2019-01-16 | Disposition: A | Discharge status: Home / Self Care | Payer: Medicare Other | Source: Ambulatory Visit | Attending: Radiation Oncology | Admitting: Radiation Oncology

## 2019-01-16 ENCOUNTER — Encounter (HOSPITAL_COMMUNITY): Payer: Self-pay

## 2019-01-16 ENCOUNTER — Ambulatory Visit (HOSPITAL_COMMUNITY)
Admission: RE | Admit: 2019-01-16 | Discharge: 2019-01-16 | Disposition: A | Discharge status: Home / Self Care | Payer: Medicare Other | Source: Ambulatory Visit | Attending: Radiation Oncology | Admitting: Radiation Oncology

## 2019-01-16 DIAGNOSIS — C3412 Malignant neoplasm of upper lobe, left bronchus or lung: Secondary | ICD-10-CM | POA: Diagnosis present

## 2019-01-16 HISTORY — DX: Malignant (primary) neoplasm, unspecified: C80.1

## 2019-01-16 LAB — BUN & CREATININE (CHCC)
BUN: 5 mg/dL — ABNORMAL LOW (ref 8–23)
Creatinine: 0.59 mg/dL (ref 0.44–1.00)
GFR, Est AFR Am: >60 mL/min (ref >60)
GFR, Estimated: >60 mL/min (ref >60)

## 2019-01-16 MED ORDER — SODIUM CHLORIDE (PF) 0.9 % IJ SOLN
INTRAMUSCULAR | Status: AC
  Filled 2019-01-16: qty 50

## 2019-01-16 MED ORDER — IOHEXOL 300 MG/ML  SOLN
75.0000 mL | Freq: Once | INTRAMUSCULAR | Status: AC | PRN
  Administered 2019-01-16: 14:00:00 75 mL via INTRAVENOUS

## 2019-01-19 ENCOUNTER — Telehealth: Payer: Self-pay | Admitting: Nurse Practitioner

## 2019-01-19 NOTE — Telephone Encounter (Signed)
Called and spoke with pt who is requesting the results of CT which was performed 01/16/2019. I stated to pt that it looked like the CT was ordered by Radiation Oncology and Dr. Lisbeth Renshaw is her radiation oncologist. Stated to pt that she needed to contact their office to get the results of the CT but pt stated she was told that she should be receiving the results soon by Dr. Vaughan Browner. Pt has been made aware that Dr. Vaughan Browner is working at hospital due to [place].  Tonya, please advise if you are able to provide pt the results of the CT since you last saw her in March 2020. Thanks!

## 2019-01-19 NOTE — Telephone Encounter (Signed)
Yes. I agree. Please make sure she follows up with oncology to discuss results. Thanks.

## 2019-01-19 NOTE — Telephone Encounter (Signed)
Called and spoke with pt letting her know that [facility] stated for her to follow up with oncology to discuss results of CT and pt verbalized understanding. Nothing further needed.

## 2019-01-21 ENCOUNTER — Other Ambulatory Visit: Payer: Self-pay

## 2019-01-21 ENCOUNTER — Encounter: Payer: Self-pay | Admitting: Primary Care

## 2019-01-21 ENCOUNTER — Encounter (HOSPITAL_COMMUNITY): Payer: Self-pay | Admitting: Emergency Medicine

## 2019-01-21 ENCOUNTER — Ambulatory Visit: Payer: Medicare Other | Admitting: Primary Care

## 2019-01-21 ENCOUNTER — Telehealth: Payer: Self-pay | Admitting: *Deleted

## 2019-01-21 ENCOUNTER — Telehealth: Payer: Self-pay | Admitting: Radiation Oncology

## 2019-01-21 ENCOUNTER — Emergency Department (HOSPITAL_COMMUNITY)
Admission: EM | Admit: 2019-01-21 | Discharge: 2019-01-21 | Disposition: A | Discharge status: Home / Self Care | Payer: Medicare Other | Attending: Emergency Medicine | Admitting: Emergency Medicine

## 2019-01-21 ENCOUNTER — Ambulatory Visit (INDEPENDENT_AMBULATORY_CARE_PROVIDER_SITE_OTHER): Payer: Medicare Other

## 2019-01-21 VITALS — BP 88/50 | HR 102 | Temp 97.3°F | Ht 64.0 in

## 2019-01-21 DIAGNOSIS — R531 Weakness: Secondary | ICD-10-CM | POA: Diagnosis not present

## 2019-01-21 DIAGNOSIS — Z79899 Other long term (current) drug therapy: Secondary | ICD-10-CM | POA: Diagnosis not present

## 2019-01-21 DIAGNOSIS — J449 Chronic obstructive pulmonary disease, unspecified: Secondary | ICD-10-CM | POA: Diagnosis not present

## 2019-01-21 DIAGNOSIS — E871 Hypo-osmolality and hyponatremia: Secondary | ICD-10-CM | POA: Diagnosis not present

## 2019-01-21 DIAGNOSIS — R0602 Shortness of breath: Secondary | ICD-10-CM

## 2019-01-21 DIAGNOSIS — I10 Essential (primary) hypertension: Secondary | ICD-10-CM | POA: Insufficient documentation

## 2019-01-21 DIAGNOSIS — C3412 Malignant neoplasm of upper lobe, left bronchus or lung: Secondary | ICD-10-CM

## 2019-01-21 DIAGNOSIS — Z7982 Long term (current) use of aspirin: Secondary | ICD-10-CM | POA: Diagnosis not present

## 2019-01-21 DIAGNOSIS — F17210 Nicotine dependence, cigarettes, uncomplicated: Secondary | ICD-10-CM | POA: Diagnosis not present

## 2019-01-21 DIAGNOSIS — I959 Hypotension, unspecified: Secondary | ICD-10-CM | POA: Insufficient documentation

## 2019-01-21 DIAGNOSIS — R799 Abnormal finding of blood chemistry, unspecified: Secondary | ICD-10-CM | POA: Diagnosis present

## 2019-01-21 DIAGNOSIS — J700 Acute pulmonary manifestations due to radiation: Secondary | ICD-10-CM

## 2019-01-21 DIAGNOSIS — Z85118 Personal history of other malignant neoplasm of bronchus and lung: Secondary | ICD-10-CM | POA: Insufficient documentation

## 2019-01-21 DIAGNOSIS — I9589 Other hypotension: Secondary | ICD-10-CM | POA: Diagnosis not present

## 2019-01-21 DIAGNOSIS — E876 Hypokalemia: Secondary | ICD-10-CM | POA: Insufficient documentation

## 2019-01-21 LAB — CBC WITH DIFFERENTIAL/PLATELET
Abs Immature Granulocytes: 0.06 10*3/uL (ref 0.00–0.07)
Basophils Absolute: 0 10*3/uL (ref 0.0–0.1)
Basophils Relative: 0 %
Eosinophils Absolute: 0 10*3/uL (ref 0.0–0.5)
Eosinophils Relative: 0 %
HCT: 38.3 % (ref 36.0–46.0)
Hemoglobin: 13.7 g/dL (ref 12.0–15.0)
Immature Granulocytes: 1 %
Lymphocytes Relative: 12 %
Lymphs Abs: 0.9 10*3/uL (ref 0.7–4.0)
MCH: 31.6 pg (ref 26.0–34.0)
MCHC: 35.8 g/dL (ref 30.0–36.0)
MCV: 88.2 fL (ref 80.0–100.0)
Monocytes Absolute: 0.4 10*3/uL (ref 0.1–1.0)
Monocytes Relative: 5 %
Neutro Abs: 6 10*3/uL (ref 1.7–7.7)
Neutrophils Relative %: 82 %
Platelets: 238 10*3/uL (ref 150–400)
RBC: 4.34 MIL/uL (ref 3.87–5.11)
RDW: 12.3 % (ref 11.5–15.5)
WBC: 7.4 10*3/uL (ref 4.0–10.5)
nRBC: 0 % (ref 0.0–0.2)

## 2019-01-21 LAB — BASIC METABOLIC PANEL
Anion gap: 17 — ABNORMAL HIGH (ref 5–15)
BUN: 7 mg/dL (ref 6–23)
BUN: 7 mg/dL — ABNORMAL LOW (ref 8–23)
CO2: 27 mEq/L (ref 19–32)
CO2: 22 mmol/L (ref 22–32)
Calcium: 9.4 mg/dL (ref 8.4–10.5)
Calcium: 9.1 mg/dL (ref 8.9–10.3)
Chloride: 80 mEq/L — ABNORMAL LOW (ref 96–112)
Chloride: 80 mmol/L — ABNORMAL LOW (ref 98–111)
Creatinine, Ser: 0.5 mg/dL (ref 0.40–1.20)
Creatinine, Ser: 0.43 mg/dL — ABNORMAL LOW (ref 0.44–1.00)
GFR calc Af Amer: >60 mL/min (ref >60)
GFR calc non Af Amer: >60 mL/min (ref >60)
GFR: 121.21 mL/min (ref >60.00)
Glucose, Bld: 143 mg/dL — ABNORMAL HIGH (ref 70–99)
Glucose, Bld: 116 mg/dL — ABNORMAL HIGH (ref 70–99)
Potassium: 2.9 mEq/L — ABNORMAL LOW (ref 3.5–5.1)
Potassium: 3 mmol/L — ABNORMAL LOW (ref 3.5–5.1)
Sodium: 117 mEq/L — CL (ref 135–145)
Sodium: 119 mmol/L — CL (ref 135–145)

## 2019-01-21 LAB — URINALYSIS, ROUTINE W REFLEX MICROSCOPIC
Bilirubin Urine: NEGATIVE
Glucose, UA: NEGATIVE mg/dL
Hgb urine dipstick: NEGATIVE
Ketones, ur: 20 mg/dL — AB
Leukocytes,Ua: NEGATIVE
Nitrite: NEGATIVE
Protein, ur: NEGATIVE mg/dL
Specific Gravity, Urine: 1.006 (ref 1.005–1.030)
pH: 6 (ref 5.0–8.0)

## 2019-01-21 LAB — SODIUM, URINE, RANDOM: Sodium, Ur: 34 mmol/L

## 2019-01-21 LAB — SEDIMENTATION RATE: Sed Rate: 7 mm/hr (ref 0–30)

## 2019-01-21 LAB — MAGNESIUM: Magnesium: 1.6 mg/dL — ABNORMAL LOW (ref 1.7–2.4)

## 2019-01-21 MED ORDER — SODIUM CHLORIDE 0.9 % IV BOLUS
1000.0000 mL | Freq: Once | INTRAVENOUS | Status: AC
  Administered 2019-01-21: 1000 mL via INTRAVENOUS

## 2019-01-21 MED ORDER — POTASSIUM CHLORIDE CRYS ER 20 MEQ PO TBCR
40.0000 meq | EXTENDED_RELEASE_TABLET | Freq: Once | ORAL | Status: AC
  Administered 2019-01-21: 20:00:00 40 meq via ORAL
  Filled 2019-01-21: qty 2

## 2019-01-21 MED ORDER — PREDNISONE 10 MG PO TABS: ORAL_TABLET | ORAL | 0 refills | Status: DC

## 2019-01-21 MED ORDER — POTASSIUM CHLORIDE CRYS ER 20 MEQ PO TBCR: 20.0000 meq | EXTENDED_RELEASE_TABLET | Freq: Two times a day (BID) | ORAL | 1 refills | Status: DC

## 2019-01-21 MED ORDER — POTASSIUM CHLORIDE 10 MEQ/100ML IV SOLN
10.0000 meq | INTRAVENOUS | Status: AC
  Administered 2019-01-21 (×3): 10 meq via INTRAVENOUS
  Filled 2019-01-21 (×3): qty 100

## 2019-01-21 MED ORDER — MAGNESIUM SULFATE 2 GM/50ML IV SOLN
2.0000 g | Freq: Once | INTRAVENOUS | Status: AC
  Administered 2019-01-21: 2 g via INTRAVENOUS
  Filled 2019-01-21: qty 50

## 2019-01-21 MED ORDER — MAGNESIUM OXIDE -MG SUPPLEMENT 200 MG PO TABS: 1.0000 | ORAL_TABLET | Freq: Two times a day (BID) | ORAL | 0 refills | Status: DC

## 2019-01-21 NOTE — Telephone Encounter (Signed)
CALLED PATIENT TO INFORM OF CT FOR 01-23-19 - ARRIVAL TIME- 3:45 PM, PT. TO BE NPO- 4 HRS. PRIOR TO TEST, TEST TO BE @ [facility], PT. TO PICK -UP PREP FOR TEST PRIOR TO Friday - 01-23-19, SPOKE WITH PATIENT'S DAUGHTER- SISSY ELLIS AND SHE IS AWARE OF THIS TEST AND THESE RESTRICTIONS

## 2019-01-21 NOTE — Patient Instructions (Addendum)
Labs today  Continue Bevespi 2 puffs once daily   Use nebulizer 2-3 times a day  Steroid taper (30mg  x 5 days; 20mg  x 5 days; 10mg  x 5 days; 5mg  x 5 days)  HOLD norvasc and hydrochlorothiazide until resumed by provider  Push oral fluids, change position slowly   Please contact PCP to let them know your blood pressure was low and we are holding your medication and you need a follow-up with them   Follow up in 10 days with NP in office   If you have worsening shortness of breath, dizziness or O2 <88% please go to ED

## 2019-01-21 NOTE — Telephone Encounter (Signed)
Triage call from patient's daughter Scheduled visit with BW @ 1:30 Per daughter patient's sat dropping 86-87% at rest. She is not on any 02. She c/o of dizziness at times and has fallen. She has lung cancer per daughter. She is awaiting results of a scan ordered by another physician. Daughter made aware we can't result another physician's test. Nothing further needed.

## 2019-01-21 NOTE — Assessment & Plan Note (Addendum)
-   BP 88/50 right arm - Instructed patient to hold Norvasc and HCTZ - Encouraged patient push oral fluids and change position slowly - Checking BMET  - If symptoms worsen ED

## 2019-01-21 NOTE — Assessment & Plan Note (Signed)
-   Continue Bevespi 2 puffs daily - Advised patient use Albuterol nebulizer 2-3 times a day scheduled  - Ambulatory O2 97% RA (however she did not walk far, can try to re-qualify at next visit)

## 2019-01-21 NOTE — ED Triage Notes (Signed)
Pt reports that went to [facility] for follow up. Had lab work done there and had sodium 117 and potassium 2.9 and was advised to ED. Pt reports has lung cancer and had chest xray today that was good.  Pt adamant that she does not want to be admitted to hospital.

## 2019-01-21 NOTE — Progress Notes (Addendum)
@Patient  ID: [name], female    DOB: [birth year], 73 y.o.   MRN: [id number]  Chief Complaint  Patient presents with   Follow-up    SOB x3 weeks, some cough w/white mucus,     Referring provider: Ochlocknee Bing, DO  HPI: 73 year old female, current smoker. PMH significant for tobacco abuse, hypertension, hyperlipidemia, COPD, multiple lung nodules, malignant neoplasm right upper lobe lung. Patient of Dr. Vaughan Browner. Given sample of Bevespi during last OV in March.   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 Patient met with Dr. Roxan Hockey in December 2019 and was not interested in having surgical resection. Referred to Dr. Lisbeth Renshaw with radiation oncology for SBRT treatment in January. Repeat CT chest 01/16/19 showed unchanged right lung nodules, prior left suprahilar nodules no longer visualized. Suspected early localized radiation changes. Incidental findings extensive lymphadenopathy in the posterior mediastinum and centered in the upper abdomen- worrisome for lymphoma or nodal metastases for an unidentified abdominal malignancy. Patient was updated on these findings per radiation/oncology and referred to GI conference on 6/17.   01/21/2019 Patient presents today for acute visit with complaints of shortness of breath x3-4 weeks. States that her breathing has worsened since radiation treatment. Reports that her oxygen drops to 87-89% with activity at home. Continues taking Bevespi as prescribed. She has a nebulizer at home and used it a few days ago. She does not cough that often, when she does it's early in the morning and clear. Occasionally gets dizzy with position changes. Unable to walk >25 feet d/t deconditioning, using wheelchair today. Denies fever or sick contact.   Allergies  Allergen Reactions    Codeine Anaphylaxis and Swelling   Penicillins Anaphylaxis and Swelling    Childhood reaction. Has patient had a PCN reaction causing immediate rash, facial/tongue/throat swelling, SOB or lightheadedness with hypotension: Yes Has patient had a PCN reaction causing severe rash involving mucus membranes or skin necrosis: No Has patient had a PCN reaction that required hospitalization: No Has patient had a PCN reaction occurring within the last 10 years: No If all of the above answers are "NO", then may proceed with Cephalosporin use.    Anoro Ellipta [Umeclidinium-Vilanterol] Nausea And Vomiting    severe    Immunization History  Administered Date(s) Administered   Influenza,inj,Quad PF,6+ Mos 05/27/2017   Influenza-Unspecified 05/20/2013, 06/08/2016   Pneumococcal Conjugate-13 04/30/2014   Pneumococcal-Unspecified 07/01/2014   Tdap 07/23/2016   Zoster 05/12/2013    Past Medical History:  Diagnosis Date   Allergic rhinitis    Asthma 2008   Chronic obstructive pulmonary disease (COPD) ([place]) 06/08/2016   Meds: Spiriva, albuterol inhaler PFT (11/29/2016): Moderate-severe obstruction by PFT 11/2016   Ear itching 07/05/2017   Grieving 06/08/2016   History of pneumonia    x4   Hyperlipidemia 09/10/2016   On atorvastatin 40 mg QD, ASA 81 mg QD ASCVD risk 17.1%, mod-high intensity statin Risk factors: FHx sig for MI: mother 99s-50s, and father age 88, HTN, smoker   Hypertension    Night sweats 06/08/2016   NSCL Ca dx'd 05/2018   xrt comp 09/2018   Pulmonary nodules 06/12/2017   Tobacco use disorder 07/06/2016   On nicotine replacement patch. Smoking cigs since age 68. Has 60 pack year history. Has COPD dx on SABA and LAMA.    Tobacco History: Social History   Tobacco Use  Smoking Status Current Every Day Smoker   Packs/day: 0.50   Years: 60.00   Pack years: 30.00   Types: Cigarettes   Start date: 08/21/1955  Smokeless Tobacco Never Used  Tobacco Comment    3 ppd for 20+ years, has cut down to 0.5 ppd, in the past 3 months, no cigs in 1 month   Ready to quit: Not Answered Counseling given: Not Answered Comment: 3 ppd for 20+ years, has cut down to 0.5 ppd, in the past 3 months, no cigs in 1 month   Outpatient Medications Prior to Visit  Medication Sig Dispense Refill   albuterol (PROAIR HFA) 108 (90 Base) MCG/ACT inhaler Inhale 2 puffs into the lungs every 6 (six) hours as needed for wheezing or shortness of breath. 3 Inhaler 1   amLODipine (NORVASC) 5 MG tablet TAKE 1 TABLET BY MOUTH  DAILY 90 tablet 3   aspirin EC 81 MG tablet Take 81 mg by mouth daily.     atorvastatin (LIPITOR) 40 MG tablet TAKE 1 TABLET BY MOUTH  DAILY 90 tablet 3   cetirizine (ZYRTEC) 10 MG tablet Take 10 mg by mouth daily.     glucosamine-chondroitin 500-400 MG tablet Take 1 tablet by mouth daily.      Glycopyrrolate-Formoterol (BEVESPI AEROSPHERE) 9-4.8 MCG/ACT AERO Inhale 2 puffs into the lungs 2 (two) times daily. 3 Inhaler 2   hydrochlorothiazide (HYDRODIURIL) 25 MG tablet TAKE 1 TABLET BY MOUTH  DAILY 90 tablet 3   mometasone (ELOCON) 0.1 % lotion Apply 1 application topically daily. 60 mL 3   Multiple Vitamin (MULTIVITAMIN WITH MINERALS) TABS tablet Take 1 tablet by mouth daily.     ALPRAZolam (XANAX) 0.25 MG tablet Take 1 tablet prior to MRI procedure. (Patient not taking: Reported on 01/21/2019) 1 tablet 0   guaifenesin (HUMIBID E) 400 MG TABS tablet Take 400 mg by mouth daily.      No facility-administered medications prior to visit.    Review of Systems  Review of Systems  Respiratory: Positive for shortness of breath and wheezing. Negative for cough.   Cardiovascular: Negative.    Physical Exam  Pulse (!) 102    Temp (!) 97.3 F (36.3 C)    Ht 5' 4"  (1.626 m)    SpO2 99%    BMI 18.02 kg/m  Physical Exam Constitutional:      General: She is not in acute distress.    Appearance: She is ill-appearing.  Cardiovascular:     Rate and  Rhythm: Regular rhythm.     Comments: Regular Pulmonary:     Breath sounds: Wheezing present.     Comments: Moderate dyspnea on exertion Musculoskeletal: Normal range of motion.     Comments: No deformity. Slow gait, weak. Using wheelchair   Neurological:     General: No focal deficit present.     Mental Status: She is alert and oriented to person, place, and time. Mental status is at baseline.  Psychiatric:        Mood and Affect: Mood normal.        Behavior: Behavior normal.        Thought Content: Thought content normal.        Judgment: Judgment normal.      Lab Results:  CBC    Component Value Date/Time   WBC 8.2 07/08/2018 1135   RBC 4.51 07/08/2018 1135   HGB 14.0 07/08/2018 1135   HGB 14.1 05/27/2017 1459   HCT 42.0 07/08/2018 1135   HCT  41.1 05/27/2017 1459   PLT 203 07/08/2018 1135   PLT 239 05/27/2017 1459   MCV 93.1 07/08/2018 1135   MCV 92 05/27/2017 1459   MCH 31.0 07/08/2018 1135   MCHC 33.3 07/08/2018 1135   RDW 13.2 07/08/2018 1135   RDW 13.8 05/27/2017 1459   LYMPHSABS 2.9 05/27/2017 1459   EOSABS 0.1 05/27/2017 1459   BASOSABS 0.0 05/27/2017 1459    BMET    Component Value Date/Time   NA 134 (L) 07/08/2018 1135   NA 136 05/27/2017 1458   K 3.5 07/08/2018 1135   CL 98 07/08/2018 1135   CO2 26 07/08/2018 1135   GLUCOSE 114 (H) 07/08/2018 1135   BUN 5 (L) 01/16/2019 1216   BUN 7 (L) 05/27/2017 1458   CREATININE 0.59 01/16/2019 1216   CREATININE 0.69 06/08/2016 1128   CALCIUM 9.4 07/08/2018 1135   GFRNONAA >60 01/16/2019 1216   GFRNONAA 89 06/08/2016 1128   GFRAA >60 01/16/2019 1216   GFRAA >89 06/08/2016 1128    BNP No results found for: BNP  ProBNP No results found for: PROBNP  Imaging: Dg Chest 2 View  Result Date: 01/21/2019 CLINICAL DATA:  Shortness of breath. EXAM: CHEST - 2 VIEW COMPARISON:  Radiographs of October 21, 2018.  CT scan of Jan 16, 2019. FINDINGS: The heart size and mediastinal contours are within normal limits.  Both lungs are clear. Atherosclerosis of thoracic aorta is noted. No pneumothorax or pleural effusion is noted. Hyperexpansion of the lungs is noted. The visualized skeletal structures are unremarkable. IMPRESSION: No acute cardiopulmonary abnormality seen. Hyperexpansion of the lungs is noted consistent with chronic obstructive pulmonary disease. Aortic Atherosclerosis (ICD10-I70.0). Electronically Signed   By: Marijo Conception M.D.   On: 01/21/2019 13:36   Ct Chest W Contrast  Result Date: 01/16/2019 CLINICAL DATA:  Small cell lung cancer, status post SBRT EXAM: CT CHEST WITH CONTRAST TECHNIQUE: Multidetector CT imaging of the chest was performed during intravenous contrast administration. CONTRAST:  61m OMNIPAQUE IOHEXOL 300 MG/ML  SOLN COMPARISON:  CT chest dated 07/01/2018.  PET-CT dated 06/24/2018. FINDINGS: Cardiovascular: Heart is normal in size.  No pericardial effusion. Evidence of thoracic aortic atherosclerotic calcifications aortic root/arch. Three vessel coronary atherosclerosis. Mediastinum/Nodes: Posterior mediastinal lymphadenopathy, predominantly along the descending thoracic aorta, measuring up to 2.3 cm short axis (series 2/image 139). This appearance is new from the prior. Visualized thyroid is unremarkable. Lungs/Pleura: Prior hypermetabolic left suprahilar nodule is no longer evident the current CT. Suspected early localized radiation changes related to SBRT (series 7/image 51). Scattered right lung nodules, grossly unchanged, including: --1.5 cm cavitary nodule right upper lobe (series 7/image 36) --6 mm solid nodule in the right upper lobe (series 7/image 51) --2.3 cm sub solid nodule right (series 7/image 58) --7 mm subpleural nodule in the lateral right upper lobe (series 7/image 70) Mild centrilobular and paraseptal emphysematous changes. No focal consolidation. No pleural effusion or pneumothorax. Upper Abdomen: Upper abdominal lymphadenopathy, new from prior PET, including: --2.6 cm  short axis portacaval node (series 2/image 155) --2.0 cm short axis node in the porta hepatis (series 2/image 167) --2.5 cm short axis peripancreatic node (series 2/image 175) --1.8 cm short axis left para-aortic node (series 2/image 183) Spleen is normal in size. Musculoskeletal: No focal osseous lesions. IMPRESSION: Prior left suprahilar nodule is no longer visualized. Suspected early localized radiation changes. Additional right lung nodules are grossly unchanged. Extensive lymphadenopathy in the posterior mediastinum and centered in the upper abdomen, incompletely visualized. Spleen is normal  in size. This appearance is more worrisome for lymphoma or nodal metastases from an unidentified abdominal malignancy (neither of which are evident on prior PET) rather than nodal metastases related to the patient's known primary bronchogenic neoplasm. Spleen is normal in size. Consider discussion at multidisciplinary tumor board for assessment of potential tissue sampling/nodal excision. PET-CT may also be beneficial. Aortic Atherosclerosis (ICD10-I70.0) and Emphysema (ICD10-J43.9). Electronically Signed   By: Julian Hy M.D.   On: 01/16/2019 15:34     Assessment & Plan:   Post-radiation pneumonitis (HCC) - Increased shortness of breath after radiation - CT chest 01/16/19 showed suspected early localized radiation changes - Plan treat with a prolonged steroid taper (57m x 5 days; 227mx 5 days; 1064m 5 days; 5mg1m5 days) - She did not qualify for home oxygen  - FU in 10-14 days   Chronic obstructive pulmonary disease (COPD) (H[facility])BoazContinue Bevespi 2 puffs daily - Advised patient use Albuterol nebulizer 2-3 times a day scheduled  - Ambulatory O2 97% RA (however she did not walk far, can try to re-qualify at next visit)  Hypotension - BP 88/50 right arm - Instructed patient to hold Norvasc and HCTZ - Encouraged patient push oral fluids and change position slowly - Checking BMET  - If symptoms  worsen ED   Weakness - Overall weakness/deconditioning, currently wheelchair bound unable to walk >25 feet  - Refer for home PT eval d/t weakness   Addendum: BMET showed NA 117 and K 2.9. Spoke with patient and daughter, updated them on critical lab results. Strongly urged her to go to the emergency room d/t hyponatremia and hypokalemia. She felt very strongly about NOT wanting to go to the hospital. Spoke with patient for a second time on the phone and she agreed to go to [facility] 01/21/2019

## 2019-01-21 NOTE — Assessment & Plan Note (Addendum)
-   Overall weakness/deconditioning, currently wheelchair bound unable to walk >25 feet  - Refer for home PT eval d/t weakness

## 2019-01-21 NOTE — Addendum Note (Signed)
Addended by: Karmen Stabs on: 01/21/2019 05:30 PM   Modules accepted: Orders

## 2019-01-21 NOTE — Assessment & Plan Note (Signed)
-   Increased shortness of breath after radiation - CT chest 01/16/19 showed suspected early localized radiation changes - Plan treat with a prolonged steroid taper (30mg  x 5 days; 20mg  x 5 days; 10mg  x 5 days; 5mg  x 5 days) - She did not qualify for home oxygen  - FU in 10-14 days

## 2019-01-21 NOTE — ED Provider Notes (Addendum)
[facility] DEPT Provider Note   CSN: [id number] Arrival date & time: 01/21/19  1804    History   Chief Complaint Chief Complaint  Patient presents with  . Abnormal Lab    sodium 117, potassium 2.9    HPI Pamela Russell is a 73 y.o. female.     HPI   She presents for evaluation and treatment of hypokalemia and hyponatremia.  She had followed up with her doctor and routine testing showed these abnormalities.  Patient denies weakness, dizziness, nausea, vomiting, paresthesia or difficulty walking.  There are no other known modifying factors.  Past Medical History:  Diagnosis Date  . Allergic rhinitis   . Asthma 2008  . Chronic obstructive pulmonary disease (COPD) ([place]) 06/08/2016   Meds: Spiriva, albuterol inhaler PFT (11/29/2016): Moderate-severe obstruction by PFT 11/2016  . Ear itching 07/05/2017  . Grieving 06/08/2016  . History of pneumonia    x4  . Hyperlipidemia 09/10/2016   On atorvastatin 40 mg QD, ASA 81 mg QD ASCVD risk 17.1%, mod-high intensity statin Risk factors: FHx sig for MI: mother 43s-50s, and father age 55, HTN, smoker  . Hypertension   . Night sweats 06/08/2016  . NSCL Ca dx'd 05/2018   xrt comp 09/2018  . Pulmonary nodules 06/12/2017  . Tobacco use disorder 07/06/2016   On nicotine replacement patch. Smoking cigs since age 73. Has 60 pack year history. Has COPD dx on SABA and LAMA.    Patient Active Problem List   Diagnosis Date Noted  . 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 Hypotension 01/21/2019  . Weakness 01/21/2019  . COPD with acute exacerbation ([place]) 10/21/2018  . 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 Malignant neoplasm of right upper lobe of lung ([place]) 07/14/2018  . Ear itching 07/05/2017  . Pulmonary nodules 06/12/2017  . Hyperlipidemia 09/10/2016  . Tobacco use disorder 07/06/2016  . Primary hypertension 06/08/2016  . Chronic obstructive pulmonary  disease (COPD) ([place]) 06/08/2016  . Night sweats 06/08/2016    Past Surgical History:  Procedure Laterality Date  . CATARACT EXTRACTION Left 05/08/2016  . CATARACT EXTRACTION Right 05/22/2016  . TOTAL ABDOMINAL HYSTERECTOMY W/ BILATERAL SALPINGOOPHORECTOMY Bilateral 1975  . VIDEO BRONCHOSCOPY WITH ENDOBRONCHIAL NAVIGATION Left 07/09/2018   Procedure: VIDEO BRONCHOSCOPY WITH ENDOBRONCHIAL NAVIGATION;  Surgeon: Collene Gobble, MD;  Location: [facility] OR;  Service: Thoracic;  Laterality: Left;     OB History   No obstetric history on file.      Home Medications    Prior to Admission medications   Medication Sig Start Date End Date Taking? Authorizing Provider  albuterol (PROAIR HFA) 108 (90 Base) MCG/ACT inhaler Inhale 2 puffs into the lungs every 6 (six) hours as needed for wheezing or shortness of breath. 12/17/18  Yes Mannam, Praveen, MD  albuterol (PROVENTIL) (2.5 MG/3ML) 0.083% nebulizer solution Take 2.5 mg by nebulization daily as needed for wheezing or shortness of breath.   Yes [provider]  amLODipine (NORVASC) 5 MG tablet TAKE 1 TABLET BY MOUTH  DAILY Patient taking differently: Take 5 mg by mouth daily.  08/27/18  Yes [place] Bing, DO  aspirin EC 81 MG tablet Take 81 mg by mouth daily.   Yes [provider]  atorvastatin (LIPITOR) 40 MG tablet TAKE 1 TABLET BY MOUTH  DAILY Patient taking differently: Take 40 mg by mouth daily.  08/27/18  Yes Henry Bing, DO  cetirizine (ZYRTEC) 10 MG tablet Take 10 mg  by mouth daily.   Yes [provider]  diphenhydrAMINE (BENADRYL) 25 MG tablet Take 25 mg by mouth every 6 (six) hours as needed for itching or allergies.   Yes [provider]  glucosamine-chondroitin 500-400 MG tablet Take 1 tablet by mouth daily.    Yes [provider]  Glycopyrrolate-Formoterol (BEVESPI AEROSPHERE) 9-4.8 MCG/ACT AERO Inhale 2 puffs into the lungs 2 (two) times daily. 12/17/18  Yes Fenton Foy, NP   hydrochlorothiazide (HYDRODIURIL) 25 MG tablet TAKE 1 TABLET BY MOUTH  DAILY Patient taking differently: Take 25 mg by mouth daily.  08/27/18  Yes [place] Bing, DO  mometasone (ELOCON) 0.1 % lotion Apply 1 application topically daily. 01/08/19  Yes Ama Bing, DO  Multiple Vitamin (MULTIVITAMIN WITH MINERALS) TABS tablet Take 1 tablet by mouth daily.   Yes [provider]  predniSONE (DELTASONE) 10 MG tablet Take 30mg  daily x 5 days; 20mg  daily x 5 days; 10mg  daily x 5 days; 5mg  daily x 5 days 01/21/19  Yes Martyn Ehrich, NP  Magnesium Oxide 200 MG TABS Take 1 tablet (200 mg total) by mouth 2 (two) times daily. 01/21/19   Daleen Bo, MD  potassium chloride SA (K-DUR) 20 MEQ tablet Take 1 tablet (20 mEq total) by mouth 2 (two) times daily. 01/21/19   Daleen Bo, MD    Family History Family History  Problem Relation Age of Onset  . Heart disease Mother        MI in her 42's  . Stroke Mother   . Heart disease Father        CABG  . Hypertension Father   . Heart disease Sister 37       CABG  . Stroke Sister   . Diabetes Sister     Social History Social History   Tobacco Use  . Smoking status: Current Every Day Smoker    Packs/day: 0.50    Years: 60.00    Pack years: 30.00    Types: Cigarettes    Start date: 08/21/1955  . Smokeless tobacco: Never Used  . Tobacco comment: 3 ppd for 20+ years, has cut down to 0.5 ppd, in the past 3 months, no cigs in 1 month  Substance Use Topics  . Alcohol use: Yes    Alcohol/week: 1.0 standard drinks    Types: 1 Cans of beer per week    Comment: occ  . Drug use: No     Allergies   Codeine; Penicillins; and Anoro ellipta [umeclidinium-vilanterol]   Review of Systems Review of Systems  All other systems reviewed and are negative.    Physical Exam Updated Vital Signs BP (!) 158/94   Pulse 99   Temp 97.8 F (36.6 C) (Oral)   Resp 18   SpO2 97%   Physical Exam Vitals signs and nursing note reviewed.   Constitutional:      General: She is not in acute distress.    Appearance: She is well-developed. She is not ill-appearing or diaphoretic.  HENT:     Head: Normocephalic and atraumatic.     Right Ear: External ear normal.     Left Ear: External ear normal.  Eyes:     Conjunctiva/sclera: Conjunctivae normal.     Pupils: Pupils are equal, round, and reactive to light.  Neck:     Musculoskeletal: Normal range of motion and neck supple.     Trachea: Phonation normal.  Cardiovascular:     Rate and Rhythm: Normal rate and  regular rhythm.     Heart sounds: Normal heart sounds.  Pulmonary:     Effort: Pulmonary effort is normal.     Breath sounds: Normal breath sounds.  Musculoskeletal: Normal range of motion.     Comments: Normal strength arms and legs bilaterally  Skin:    General: Skin is warm and dry.  Neurological:     Mental Status: She is alert and oriented to person, place, and time.     Cranial Nerves: No cranial nerve deficit.     Sensory: No sensory deficit.     Motor: No abnormal muscle tone.     Coordination: Coordination normal.  Psychiatric:        Mood and Affect: Mood normal.        Behavior: Behavior normal.        Thought Content: Thought content normal.        Judgment: Judgment normal.      ED Treatments / Results  Labs (all labs ordered are listed, but only abnormal results are displayed) Labs Reviewed  BASIC METABOLIC PANEL - Abnormal; Notable for the following components:      Result Value   Sodium 119 (*)    Potassium 3.0 (*)    Chloride 80 (*)    Glucose, Bld 116 (*)    BUN 7 (*)    Creatinine, Ser 0.43 (*)    Anion gap 17 (*)    All other components within normal limits  MAGNESIUM - Abnormal; Notable for the following components:   Magnesium 1.6 (*)    All other components within normal limits  URINALYSIS, ROUTINE W REFLEX MICROSCOPIC - Abnormal; Notable for the following components:   Ketones, ur 20 (*)    All other components within  normal limits  CBC WITH DIFFERENTIAL/PLATELET  SODIUM, URINE, RANDOM  CORTISOL  OSMOLALITY  OSMOLALITY, URINE    EKG EKG Interpretation  Date/Time:  Wednesday January 21 2019 18:20:14 EDT Ventricular Rate:  102 PR Interval:    QRS Duration: 108 QT Interval:  361 QTC Calculation: 471 R Axis:   38 Text Interpretation:  Sinus tachycardia Borderline prolonged PR interval Probable left atrial enlargement Anteroseptal infarct, age indeterminate Since last tracing PR interval is longer Confirmed by Daleen Bo [phone]) on 01/21/2019 6:50:01 PM   Radiology Dg Chest 2 View  Result Date: 01/21/2019 CLINICAL DATA:  Shortness of breath. EXAM: CHEST - 2 VIEW COMPARISON:  Radiographs of October 21, 2018.  CT scan of Jan 16, 2019. FINDINGS: The heart size and mediastinal contours are within normal limits. Both lungs are clear. Atherosclerosis of thoracic aorta is noted. No pneumothorax or pleural effusion is noted. Hyperexpansion of the lungs is noted. The visualized skeletal structures are unremarkable. IMPRESSION: No acute cardiopulmonary abnormality seen. Hyperexpansion of the lungs is noted consistent with chronic obstructive pulmonary disease. Aortic Atherosclerosis (ICD10-I70.0). Electronically Signed   By: Marijo Conception M.D.   On: 01/21/2019 13:36    Procedures .Critical Care Performed by: Daleen Bo, MD Authorized by: Daleen Bo, MD   Critical care provider statement:    Critical care time (minutes):  35   Critical care start time:  01/21/2019 6:45 PM   Critical care time was exclusive of:  Separately billable procedures and treating other patients   Critical care was necessary to treat or prevent imminent or life-threatening deterioration of the following conditions:  Metabolic crisis   Critical care was time spent personally by me on the following activities:  Blood draw for specimens,  development of treatment plan with patient or surrogate, discussions with consultants, evaluation  of patient's response to treatment, examination of patient, obtaining history from patient or surrogate, ordering and performing treatments and interventions, ordering and review of laboratory studies, pulse oximetry, re-evaluation of patient's condition, review of old charts and ordering and review of radiographic studies   (including critical care time)  Medications Ordered in ED Medications  potassium chloride 10 mEq in 100 mL IVPB (10 mEq Intravenous New Bag/Given 01/21/19 2045)  magnesium sulfate IVPB 2 g 50 mL (has no administration in time range)  sodium chloride 0.9 % bolus 1,000 mL (0 mLs Intravenous Stopped 01/21/19 2022)  potassium chloride SA (K-DUR) CR tablet 40 mEq (40 mEq Oral Given 01/21/19 1941)     Initial Impression / Assessment and Plan / ED Course  I have reviewed the triage vital signs and the nursing notes.  Pertinent labs & imaging results that were available during my care of the patient were reviewed by me and considered in my medical decision making (see chart for details).  Clinical Course as of Jan 21 2112  Wed Jan 21, 2019  2107 Sodium, Urine: 34 [EW]  2108 Low  Magnesium(!) [EW]  2108 Abnormal, sodium low, potassium low, chloride low, glucose high, BUN low, creatinine low, anion gap elevated  Basic metabolic panel(!!) [EW]  9528 Normal except ketones present  Urinalysis, Routine w reflex microscopic(!) [EW]  2109 Normal  CBC with Differential [EW]    Clinical Course User Index [EW] Daleen Bo, MD        Patient Vitals for the past 24 hrs:  BP Temp Temp src Pulse Resp SpO2  01/21/19 2045 (!) 158/94 - - 99 18 97 %  01/21/19 2015 (!) 160/82 - - (!) 106 19 97 %  01/21/19 1930 (!) 174/86 - - 99 (!) 21 98 %  01/21/19 1900 (!) 162/75 - - 100 (!) 25 97 %  01/21/19 1830 (!) 160/68 - - (!) 101 (!) 21 96 %  01/21/19 1820 (!) 182/79 97.8 F (36.6 C) Oral (!) 101 (!) 22 97 %    9:13 PM Reevaluation with update and discussion. After initial assessment  and treatment, an updated evaluation reveals she is comfortable and has no further complaints.  Findings discussed and questions answered. Daleen Bo   Medical Decision Making: Nonspecific hyponatremia, with associated low potassium and magnesium.  Patient is choosing not to stay in the hospital.  No overt hemodynamic instability.  He has access to follow-up care.  Initial treatment begun, which should help her condition.  Medication ordered to use as an outpatient.  CRITICAL CARE-yes Performed by: Daleen Bo   Nursing Notes Reviewed/ Care Coordinated Applicable Imaging Reviewed Interpretation of Laboratory Data incorporated into ED treatment  The patient appears reasonably screened and/or stabilized for discharge and I doubt any other medical condition or other [facility] requiring further screening, evaluation, or treatment in the ED at this time prior to discharge.  Plan: Home Medications-continue current medication; Home Treatments-increase oral fluid and food intake; return here if the recommended treatment, does not improve the symptoms; Recommended follow up-PCP checkup 1 week, sooner as needed    Final Clinical Impressions(s) / ED Diagnoses   Final diagnoses:  Hyponatremia  Hypokalemia  Hypomagnesemia    ED Discharge Orders         Ordered    potassium chloride SA (K-DUR) 20 MEQ tablet  2 times daily     01/21/19 2113    Magnesium Oxide  200 MG TABS  2 times daily     01/21/19 2113           Daleen Bo, MD 01/21/19 2115        Daleen Bo, MD 01/28/19 1122

## 2019-01-21 NOTE — Telephone Encounter (Signed)
I called the patient to let her know the findings of her recent chest CT and the unexpected retroperitoneal adenopathy seen. She is asymptomatic, and denies any GI symptoms. She is having some trouble with her COPD and was encouraged to keep her appointments with pulmonary today. We will add her on to GI conference on 6/17

## 2019-01-21 NOTE — Discharge Instructions (Signed)
Try to eat 3 meals a day and drink plenty of fluids.  Since you are choosing to not stay in the hospital today, call your PCP in the morning to arrange for follow-up care.  Let them know that we ordered the tests that they wanted, tonight.

## 2019-01-21 NOTE — Telephone Encounter (Signed)
Left message with Pamela Russell to bring mother Chenel to the office at 115pm to get xray before appointment.

## 2019-01-22 ENCOUNTER — Telehealth: Payer: Self-pay

## 2019-01-22 LAB — CORTISOL: Cortisol, Plasma: 12.7 ug/dL

## 2019-01-22 LAB — OSMOLALITY, URINE: Osmolality, Ur: 238 mOsm/kg — ABNORMAL LOW (ref 300–900)

## 2019-01-22 LAB — OSMOLALITY: Osmolality: 245 mOsm/kg — CL (ref 275–295)

## 2019-01-22 NOTE — Telephone Encounter (Signed)
Spoke with pt.  Informed her of BW recommendations of checking BP and adding a little salt to food.  Instructed not to take hydrochlorothiazide until she meets with PCP.  Is taking the mag-ox and potassium as prescribed.  Pt stated she is feeling much better and is able to walk without taking breaks.  Nothinf further is needed.

## 2019-01-22 NOTE — Telephone Encounter (Signed)
I would have her contact PCP for recommendations on sodium replacement. Likely recommend she increase sodium in her diet/salt her food. If unable to get an answer from them call back. She should continue potassium and magnesium recommended per ED doctor. Did they give her any recommendations about resuming her blood pressure medication? Follow up with PCP on Monday as scheduled. Follow up with [place] in 10-14 days as scheduled.

## 2019-01-22 NOTE — Telephone Encounter (Signed)
Pt seen in clinic yesterday.  After labs resulted she was told that sodium and K-Dur were going to be sent to pharmacy. Then informed she needed to proceed to ED.  Does the patient still need sodium? She was d/c with K-Dur 20 bid and Mag Ox 200 bid.        Assessment & Plan:   Post-radiation pneumonitis (HCC) - Increased shortness of breath after radiation - CT chest 01/16/19 showed suspected early localized radiation changes - Plan treat with a prolonged steroid taper (30mg  x 5 days; 20mg  x 5 days; 10mg  x 5 days; 5mg  x 5 days) - She did not qualify for home oxygen  - FU in 10-14 days   Chronic obstructive pulmonary disease (COPD) (H[facility]) - Continue Bevespi 2 puffs daily - Advised patient use Albuterol nebulizer 2-3 times a day scheduled  - Ambulatory O2 97% RA (however she did not walk far, can try to re-qualify at next visit)  Hypotension - BP 88/50 right arm - Instructed patient to hold Norvasc and HCTZ - Encouraged patient push oral fluids and change position slowly - Checking BMET  - If symptoms worsen ED   Weakness - Overall weakness/deconditioning, currently wheelchair bound unable to walk >25 feet  - Refer for home PT eval d/t weakness   Addendum: BMET showed NA 117 and K 2.9. Spoke with patient and daughter, updated them on critical lab results. Strongly urged her to go to the emergency room d/t hyponatremia and hypokalemia. She felt very strongly about NOT wanting to go to the hospital. Spoke with patient for a second time on the phone and she agreed to go to [place], NP 01/21/2019

## 2019-01-23 ENCOUNTER — Ambulatory Visit (HOSPITAL_COMMUNITY): Admission: RE | Admit: 2019-01-23 | Payer: Medicare Other | Source: Ambulatory Visit

## 2019-01-26 ENCOUNTER — Other Ambulatory Visit: Payer: Self-pay

## 2019-01-26 ENCOUNTER — Encounter: Payer: Self-pay | Admitting: Family Medicine

## 2019-01-26 ENCOUNTER — Ambulatory Visit (INDEPENDENT_AMBULATORY_CARE_PROVIDER_SITE_OTHER): Payer: Medicare Other | Admitting: Family Medicine

## 2019-01-26 VITALS — BP 120/60 | HR 75

## 2019-01-26 DIAGNOSIS — J700 Acute pulmonary manifestations due to radiation: Secondary | ICD-10-CM

## 2019-01-26 DIAGNOSIS — E878 Other disorders of electrolyte and fluid balance, not elsewhere classified: Secondary | ICD-10-CM

## 2019-01-26 DIAGNOSIS — I9589 Other hypotension: Secondary | ICD-10-CM

## 2019-01-26 NOTE — Patient Instructions (Addendum)
It was wonderful meeting you today.  You may restart taking Norvasc 5 mg daily for your blood pressure. DO NOT TAKE YOUR HCTZ. I would also like you to take your blood pressure daily at home and start keeping a journal of this.  We will also get some repeat labs today to see how your electrolytes are doing, I will let you know these results within the next week.  Please schedule a telemedicine visit for the next 2-3 weeks that we can go over your blood pressures.  If they remain persistently over 550 systolic, please let us know sooner.

## 2019-01-26 NOTE — Assessment & Plan Note (Signed)
Sodium 119, K 3.0, MG 1.6 on 01/21/2019.  Suspect thiazide induced electrolyte imbalances, however could consider additional differential such as hypovolemic hyponatremia, thyroid abnormality, SIADH with concurrent lung disorders, or glucocorticoid deficiency if her electrolytes do not balance with cessation of HCTZ.  However mindful she is on a prednisone taper currently, started on 6/3. - Obtain BMP and magnesium level today - Continue K-Dur and Mg supplements, discontinuation based off of labs today - Could consider repeat urine sodium/osmolality in future if not improved

## 2019-01-26 NOTE — Assessment & Plan Note (Addendum)
Improving with steroid taper provided by pulmonologist.  Requested an albuterol inhaler sample, as her most recent prescription was mailed to her "empty" and is not due for a refill. - Continue steroid taper, F/U with pulmonologist next week - Unfortunately have no samples of albuterol in the office, suggested reaching out to her pulmonologist to see if they have any samples they can provide her.  Reassured she has albuterol nebulizer at home and beta agonist within her controlling inhaler.

## 2019-01-26 NOTE — Assessment & Plan Note (Addendum)
Resolved, asymptomatic at that time. Normotensive seated and standing in the clinic today, however patient reports consistently hypertensive at home and adamant to restart one of her BP medications.  Will restart Norvasc 5 mg and continue to hold HCTZ as suspect this was contributing to electrolyte abnormalities (hyponatremia, hypokalemia). - Restart home amlodipine 5 mg - Measure BP daily at home and keep a journal -Encouraged good hydration - Return precautions sooner than scheduled discussed, including if SBP consistently >150, or if <90 - Telemedicine visit for BP follow-up in 2-3 weeks

## 2019-01-26 NOTE — Progress Notes (Signed)
Subjective:    Patient ID: Pamela Russell, female    DOB: 1946-05-30, 73 y.o.   MRN: [id number]   CC: low BP at pulmonologist office  HPI: Pamela Russell is a 73 year old female presenting discuss the following:  Episode of Hypotension: BP 88/50 in her right arm during pulmonologist office visit on 6/3.  Thinks she was a little dehydrated at that time, however asymptomatic.  They recommended holding her Norvasc and HCTZ and obtained BMET.  K and sodium returned low, she proceeded to the ED.  While in the ED, her blood pressure ranged 619-509 systolic.  She started on potassium and magnesium supplementation per ED physician.  Obtained urine sodium, osmolality which were noncontributory.  She has not been taking her Norvasc or her HCTZ since 6/3.  However is been taking the magnesium 200 mg twice daily tablets and K-Dur 20 meq twice daily prescribed per ED.  She is adamant to restart 1 of her blood pressure medications today as her blood pressure at home has been around the 326 [id number]/98P diastolic.   Pneumonitis: Recent increase shortness of breath post radiation for lung cancer, saw her pulmonologist on 6/3.   CXR clear at that time. Started her on a prednisone taper which she started on 6/3, down to 20 mg today, has additional 15 days of the taper left.  She is feeling a little less short of breath (acute on chronic) after starting this taper.  Denies any headache, visual changes, weakness, numbness, increased coughing, or fever.    Review of Systems Per HPI    Patient Active Problem List   Diagnosis Date Noted  . Electrolyte disturbance 01/26/2019  . 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 Hypotension 01/21/2019  . Weakness 01/21/2019  . COPD with acute exacerbation ([place]) 10/21/2018  . 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 Malignant neoplasm of right upper lobe of lung ([place]) 07/14/2018  . Ear itching 07/05/2017  . Pulmonary nodules  06/12/2017  . Hyperlipidemia 09/10/2016  . Tobacco use disorder 07/06/2016  . Primary hypertension 06/08/2016  . Chronic obstructive pulmonary disease (COPD) ([place]) 06/08/2016  . Night sweats 06/08/2016     Objective:  BP 120/60   Pulse 75   SpO2 98%   BP after 2 minutes of standing: 126/64 Vitals and nursing note reviewed   General: Pleasant, small older female in no acute distress Cardiac: RRR, normal heart sounds Respiratory: Faint breath sounds throughout, no appreciable wheezing, rhonchi, or rales.  Unlabored breathing, satting 98% on room air.  Mild tachypnea while walking with mask on.  Abdomen: soft, nontender, nondistended Extremities: no edema or cyanosis. WWP. Skin: warm and dry, no rashes noted Neuro: alert and oriented, no focal deficits Psych: normal affect  Assessment & Plan:   Hypotension Resolved, asymptomatic at that time. Normotensive seated and standing in the clinic today, however patient reports consistently hypertensive at home and adamant to restart one of her BP medications.  Will restart Norvasc 5 mg and continue to hold HCTZ as suspect this was contributing to electrolyte abnormalities (hyponatremia, hypokalemia). 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  Requested an albuterol inhaler sample, as her most recent prescription was mailed to her "empty"  and is not due for a refill. - Continue steroid taper, F/U with pulmonologist next week - Unfortunately have no samples of albuterol in the office, suggested reaching out to her pulmonologist to see if they have any samples they can provide her.  Reassured she has albuterol nebulizer at home and beta agonist  within her controlling inhaler.  Electrolyte disturbance Sodium 119, K 3.0, MG 1.6 on 01/21/2019.  Suspect thiazide induced electrolyte imbalances, however could consider additional differential such as hypovolemic hyponatremia, thyroid abnormality, SIADH with concurrent lung disorders, or glucocorticoid deficiency if her electrolytes do not balance with cessation of HCTZ.  However mindful she is on a prednisone taper currently, started on 6/3. - Obtain BMP and magnesium level today - Continue K-Dur and Mg supplements, discontinuation based off of labs today - Could consider repeat urine sodium/osmolality in future if not improved  F/U in approximately 2-3 weeks for BP follow-up, may vary pending lab results  Darrelyn Hillock, DO Family Medicine Resident PGY-1

## 2019-01-27 ENCOUNTER — Emergency Department (HOSPITAL_COMMUNITY)
Admission: EM | Admit: 2019-01-27 | Discharge: 2019-01-27 | Disposition: A | Discharge status: Home / Self Care | Payer: Medicare Other | Attending: Emergency Medicine | Admitting: Emergency Medicine

## 2019-01-27 ENCOUNTER — Encounter (HOSPITAL_COMMUNITY): Payer: Self-pay

## 2019-01-27 ENCOUNTER — Other Ambulatory Visit: Payer: Self-pay

## 2019-01-27 ENCOUNTER — Telehealth: Payer: Self-pay | Admitting: Family Medicine

## 2019-01-27 DIAGNOSIS — Z79899 Other long term (current) drug therapy: Secondary | ICD-10-CM | POA: Insufficient documentation

## 2019-01-27 DIAGNOSIS — Z7982 Long term (current) use of aspirin: Secondary | ICD-10-CM | POA: Insufficient documentation

## 2019-01-27 DIAGNOSIS — E871 Hypo-osmolality and hyponatremia: Secondary | ICD-10-CM

## 2019-01-27 DIAGNOSIS — J449 Chronic obstructive pulmonary disease, unspecified: Secondary | ICD-10-CM | POA: Diagnosis not present

## 2019-01-27 DIAGNOSIS — I10 Essential (primary) hypertension: Secondary | ICD-10-CM | POA: Insufficient documentation

## 2019-01-27 DIAGNOSIS — F17210 Nicotine dependence, cigarettes, uncomplicated: Secondary | ICD-10-CM | POA: Insufficient documentation

## 2019-01-27 DIAGNOSIS — E875 Hyperkalemia: Secondary | ICD-10-CM | POA: Diagnosis present

## 2019-01-27 LAB — COMPREHENSIVE METABOLIC PANEL
ALT: 20 U/L (ref 0–44)
AST: 24 U/L (ref 15–41)
Albumin: 4.4 g/dL (ref 3.5–5.0)
Alkaline Phosphatase: 66 U/L (ref 38–126)
Anion gap: 12 (ref 5–15)
BUN: 10 mg/dL (ref 8–23)
CO2: 21 mmol/L — ABNORMAL LOW (ref 22–32)
Calcium: 9.8 mg/dL (ref 8.9–10.3)
Chloride: 98 mmol/L (ref 98–111)
Creatinine, Ser: 0.39 mg/dL — ABNORMAL LOW (ref 0.44–1.00)
GFR calc Af Amer: >60 mL/min (ref >60)
GFR calc non Af Amer: >60 mL/min (ref >60)
Glucose, Bld: 111 mg/dL — ABNORMAL HIGH (ref 70–99)
Potassium: 4.4 mmol/L (ref 3.5–5.1)
Sodium: 131 mmol/L — ABNORMAL LOW (ref 135–145)
Total Bilirubin: 0.6 mg/dL (ref 0.3–1.2)
Total Protein: 7.5 g/dL (ref 6.5–8.1)

## 2019-01-27 LAB — CBC WITH DIFFERENTIAL/PLATELET
Abs Immature Granulocytes: 0.07 10*3/uL (ref 0.00–0.07)
Basophils Absolute: 0.1 10*3/uL (ref 0.0–0.1)
Basophils Relative: 1 %
Eosinophils Absolute: 0.1 10*3/uL (ref 0.0–0.5)
Eosinophils Relative: 1 %
HCT: 40.5 % (ref 36.0–46.0)
Hemoglobin: 14 g/dL (ref 12.0–15.0)
Immature Granulocytes: 1 %
Lymphocytes Relative: 28 %
Lymphs Abs: 2.5 10*3/uL (ref 0.7–4.0)
MCH: 32.3 pg (ref 26.0–34.0)
MCHC: 34.6 g/dL (ref 30.0–36.0)
MCV: 93.3 fL (ref 80.0–100.0)
Monocytes Absolute: 1 10*3/uL (ref 0.1–1.0)
Monocytes Relative: 11 %
Neutro Abs: 5.2 10*3/uL (ref 1.7–7.7)
Neutrophils Relative %: 58 %
Platelets: 295 10*3/uL (ref 150–400)
RBC: 4.34 MIL/uL (ref 3.87–5.11)
RDW: 13.9 % (ref 11.5–15.5)
WBC: 8.8 10*3/uL (ref 4.0–10.5)
nRBC: 0 % (ref 0.0–0.2)

## 2019-01-27 LAB — BASIC METABOLIC PANEL
BUN/Creatinine Ratio: 10 — ABNORMAL LOW (ref 12–28)
BUN: 5 mg/dL — ABNORMAL LOW (ref 8–27)
CO2: 23 mmol/L (ref 20–29)
Calcium: 11 mg/dL — ABNORMAL HIGH (ref 8.7–10.3)
Chloride: 91 mmol/L — ABNORMAL LOW (ref 96–106)
Creatinine, Ser: 0.51 mg/dL — ABNORMAL LOW (ref 0.57–1.00)
GFR calc Af Amer: 111 mL/min/{1.73_m2} (ref >59)
GFR calc non Af Amer: 96 mL/min/{1.73_m2} (ref >59)
Glucose: 135 mg/dL — ABNORMAL HIGH (ref 65–99)
Potassium: 6.7 mmol/L (ref 3.5–5.2)
Sodium: 130 mmol/L — ABNORMAL LOW (ref 134–144)

## 2019-01-27 LAB — MAGNESIUM: Magnesium: 1.9 mg/dL (ref 1.6–2.3)

## 2019-01-27 MED ORDER — ALBUTEROL SULFATE HFA 108 (90 BASE) MCG/ACT IN AERS
2.0000 | INHALATION_SPRAY | RESPIRATORY_TRACT | Status: DC | PRN
  Administered 2019-01-27: 2 via RESPIRATORY_TRACT
  Filled 2019-01-27: qty 6.7

## 2019-01-27 NOTE — Discharge Instructions (Addendum)
Your potassium today is normal. Have your primary care provider monitor your sodium and potassium. If the potassium level drops, she might have to restart the potassium and magnesium pills.

## 2019-01-27 NOTE — ED Provider Notes (Signed)
[facility] DEPT Provider Note   CSN: [id number] Arrival date & time: 01/27/19  0537    History   Chief Complaint Chief Complaint  Patient presents with  . abnormal labs    HPI ADDALEE Pamela Russell is a 73 y.o. female.   The history is provided by the patient.  She has history of hypertension, hyperlipidemia, lung cancer, COPD and was told to come to the emergency department because of an elevated potassium on a routine blood draw yesterday.  Curiously, she had been in the ED 6 days ago for low sodium and low potassium.  She has no complaints.  Past Medical History:  Diagnosis Date  . Allergic rhinitis   . Asthma 2008  . Chronic obstructive pulmonary disease (COPD) ([place]) 06/08/2016   Meds: Spiriva, albuterol inhaler PFT (11/29/2016): Moderate-severe obstruction by PFT 11/2016  . Ear itching 07/05/2017  . Grieving 06/08/2016  . History of pneumonia    x4  . Hyperlipidemia 09/10/2016   On atorvastatin 40 mg QD, ASA 81 mg QD ASCVD risk 17.1%, mod-high intensity statin Risk factors: FHx sig for MI: mother 64s-50s, and father age 91, HTN, smoker  . Hypertension   . Night sweats 06/08/2016  . NSCL Ca dx'd 05/2018   xrt comp 09/2018  . Pulmonary nodules 06/12/2017  . Tobacco use disorder 07/06/2016   On nicotine replacement patch. Smoking cigs since age 64. Has 60 pack year history. Has COPD dx on SABA and LAMA.    Patient Active Problem List   Diagnosis Date Noted  . Electrolyte disturbance 01/26/2019  . 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 Hypotension 01/21/2019  . Weakness 01/21/2019  . COPD with acute exacerbation ([place]) 10/21/2018  . Primary malignant neoplasm of bronchus of left upper lobe ([place]) 09/08/2018  . Malignant neoplasm of right upper lobe of lung ([place]) 07/14/2018  . Ear itching 07/05/2017  . Pulmonary nodules 06/12/2017  . Hyperlipidemia 09/10/2016  . Tobacco use disorder 07/06/2016  . Primary hypertension 06/08/2016  .  Chronic obstructive pulmonary disease (COPD) ([place]) 06/08/2016  . Night sweats 06/08/2016    Past Surgical History:  Procedure Laterality Date  . CATARACT EXTRACTION Left 05/08/2016  . CATARACT EXTRACTION Right 05/22/2016  . TOTAL ABDOMINAL HYSTERECTOMY W/ BILATERAL SALPINGOOPHORECTOMY Bilateral 1975  . VIDEO BRONCHOSCOPY WITH ENDOBRONCHIAL NAVIGATION Left 07/09/2018   Procedure: VIDEO BRONCHOSCOPY WITH ENDOBRONCHIAL NAVIGATION;  Surgeon: Collene Gobble, MD;  Location: [facility] OR;  Service: Thoracic;  Laterality: Left;     OB History   No obstetric history on file.      Home Medications    Prior to Admission medications   Medication Sig Start Date End Date Taking? Authorizing Provider  albuterol (PROAIR HFA) 108 (90 Base) MCG/ACT inhaler Inhale 2 puffs into the lungs every 6 (six) hours as needed for wheezing or shortness of breath. 12/17/18   Mannam, Praveen, MD  albuterol (PROVENTIL) (2.5 MG/3ML) 0.083% nebulizer solution Take 2.5 mg by nebulization daily as needed for wheezing or shortness of breath.    [provider]  amLODipine (NORVASC) 5 MG tablet TAKE 1 TABLET BY MOUTH  DAILY Patient taking differently: Take 5 mg by mouth daily.  08/27/18   [place] Bing, DO  aspirin EC 81 MG tablet Take 81 mg by mouth daily.    [provider]  atorvastatin (LIPITOR) 40 MG tablet TAKE 1 TABLET BY MOUTH  DAILY Patient taking differently: Take 40 mg by mouth daily.  08/27/18   [place] Bing, DO  cetirizine (ZYRTEC) 10 MG tablet Take 10 mg by mouth daily.    [provider]  diphenhydrAMINE (BENADRYL) 25 MG tablet Take 25 mg by mouth every 6 (six) hours as needed for itching or allergies.    [provider]  glucosamine-chondroitin 500-400 MG tablet Take 1 tablet by mouth daily.     [provider]  Glycopyrrolate-Formoterol (BEVESPI AEROSPHERE) 9-4.8 MCG/ACT AERO Inhale 2 puffs into the lungs 2 (two) times daily. 12/17/18   [place] Foy, NP   hydrochlorothiazide (HYDRODIURIL) 25 MG tablet TAKE 1 TABLET BY MOUTH  DAILY Patient taking differently: Take 25 mg by mouth daily.  08/27/18   [place] Bing, DO  Magnesium Oxide 200 MG TABS Take 1 tablet (200 mg total) by mouth 2 (two) times daily. 01/21/19   Daleen Bo, MD  mometasone (ELOCON) 0.1 % lotion Apply 1 application topically daily. 01/08/19   [place] Bing, DO  Multiple Vitamin (MULTIVITAMIN WITH MINERALS) TABS tablet Take 1 tablet by mouth daily.    [provider]  potassium chloride SA (K-DUR) 20 MEQ tablet Take 1 tablet (20 mEq total) by mouth 2 (two) times daily. 01/21/19   Daleen Bo, MD  predniSONE (DELTASONE) 10 MG tablet Take 30mg  daily x 5 days; 20mg  daily x 5 days; 10mg  daily x 5 days; 5mg  daily x 5 days 01/21/19   Martyn Ehrich, NP    Family History Family History  Problem Relation Age of Onset  . Heart disease Mother        MI in her 75's  . Stroke Mother   . Heart disease Father        CABG  . Hypertension Father   . Heart disease Sister 3       CABG  . Stroke Sister   . Diabetes Sister     Social History Social History   Tobacco Use  . Smoking status: Current Every Day Smoker    Packs/day: 0.50    Years: 60.00    Pack years: 30.00    Types: Cigarettes    Start date: 08/21/1955  . Smokeless tobacco: Never Used  . Tobacco comment: 3 ppd for 20+ years, has cut down to 0.5 ppd, in the past 3 months, no cigs in 1 month  Substance Use Topics  . Alcohol use: Yes    Alcohol/week: 1.0 standard drinks    Types: 1 Cans of beer per week    Comment: occ  . Drug use: No     Allergies   Codeine; Penicillins; and Anoro ellipta [umeclidinium-vilanterol]   Review of Systems Review of Systems  All other systems reviewed and are negative.    Physical Exam Updated Vital Signs BP (!) 176/98 (BP Location: Left Arm)   Pulse (!) 124   Temp (!) 97.5 F (36.4 C) (Oral)   Resp 20   Ht 5\' 4"  (1.626 m)   Wt 46.7 kg   SpO2 97%   BMI  17.68 kg/m   Physical Exam Vitals signs and nursing note reviewed.    73 year old female, resting comfortably and in no acute distress. Vital signs are significant for elevated blood pressure and heart rate. Oxygen saturation is 97%, which is normal. Head is normocephalic and atraumatic. PERRLA, EOMI. Oropharynx is clear. Neck is nontender and supple without adenopathy or JVD. Back is nontender and there is no CVA tenderness. Lungs have scattered wheezes without rales or rhonchi. Chest is nontender. Heart has regular rate and rhythm without murmur.  Abdomen is soft, flat, nontender without masses or hepatosplenomegaly and peristalsis is normoactive. Extremities have no cyanosis or edema, full range of motion is present. Skin is warm and dry without rash. Neurologic: Mental status is normal, cranial nerves are intact, there are no motor or sensory deficits.  ED Treatments / Results  Labs (all labs ordered are listed, but only abnormal results are displayed) Labs Reviewed  COMPREHENSIVE METABOLIC PANEL - Abnormal; Notable for the following components:      Result Value   Sodium 131 (*)    CO2 21 (*)    Glucose, Bld 111 (*)    Creatinine, Ser 0.39 (*)    All other components within normal limits  CBC WITH DIFFERENTIAL/PLATELET    EKG EKG Interpretation  Date/Time:  Tuesday January 27 2019 06:16:15 EDT Ventricular Rate:  117 PR Interval:    QRS Duration: 90 QT Interval:  308 QTC Calculation: 430 R Axis:   -56 Text Interpretation:  Sinus tachycardia Consider right atrial enlargement LAD, consider left anterior fascicular block Anterior infarct, old Baseline wander in lead(s) V1 Low voltage QRS When compared with ECG of 01/21/2019, No significant change was found Confirmed by Delora Fuel ([id number]) on 01/27/2019 6:34:10 AM  Procedures Procedures  Medications Ordered in ED Medications - No data to display   Initial Impression / Assessment and Plan / ED Course  I have reviewed the  triage vital signs and the nursing notes.  Pertinent labs & imaging results that were available during my care of the patient were reviewed by me and considered in my medical decision making (see chart for details).  Hyperkalemia at lab draw and family practice office yesterday.  Suspect lab error, possibly hemolyzed specimen.  Old records are reviewed, and on 6 3 she had significant hyponatremia and hypokalemia with sodium 117 and potassium 2.9, improved to sodium 119 and potassium 3.0 at discharge.  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  Will recheck electrolytes today and will also check ECG for signs of hyperkalemia.  Also, note that CT of her chest on May 29 showed posterior mediastinal lymphadenopathy worrisome for lymphoma or metastasis from abdominal malignancy.  She is scheduled for a CT of abdomen and pelvis for further evaluation of this.  ECG shows no evidence of hyperkalemia.  Potassium has come back normal at 4.7.  At this point, I feel she can safely stop her potassium and magnesium supplementation.  Sodium has also come up significantly and is at a tolerable level of 131.  She is referred back to her PCP for follow-up.  She will need to follow her electrolytes fairly closely.  Final Clinical Impressions(s) / ED Diagnoses   Final diagnoses:  Hyponatremia    ED Discharge Orders    None       Delora Fuel, MD 00/34/91 [phone]

## 2019-01-27 NOTE — Telephone Encounter (Signed)
**  After Hours/ Emergency Line Call**  Received a call to report that [name] has a critical lab result with potassium of 6.7. I called patient to patient to discuss this and recommend she go to the ED for repeat labs, evaluation and treatment. Of note she was seen in ED 6/3 for abnormal labs done at pulmonologist office and noted to have hypokalemia with potassium of 2.9, this was repleted and she was sent home on k-dur 20 meq BID which she has been taking. She reports she is feeling well and agrees to go to [facility] ED. Called [facility] charge nurse to inform them. Will forward to PCP.  Steve Rattler, DO PGY-3, [facility]

## 2019-01-27 NOTE — ED Triage Notes (Signed)
Pt was told by her doctor that her potassium was high and she needed to come to the ED, pt also states that she hasn't been able to sleep because of taking steroids

## 2019-01-27 NOTE — ED Notes (Signed)
Pt verbalized discharge instructions and follow up care. Alert and ambulatory. Family picking her up. No IV

## 2019-01-27 NOTE — ED Notes (Signed)
Pt ambulated to the bathroom with no assistance. Gait steady

## 2019-01-27 NOTE — ED Notes (Signed)
Provider at bedside

## 2019-01-28 ENCOUNTER — Ambulatory Visit (HOSPITAL_COMMUNITY)
Admission: RE | Admit: 2019-01-28 | Discharge: 2019-01-28 | Disposition: A | Discharge status: Home / Self Care | Payer: Medicare Other | Source: Ambulatory Visit | Attending: Radiation Oncology | Admitting: Radiation Oncology

## 2019-01-28 DIAGNOSIS — C3412 Malignant neoplasm of upper lobe, left bronchus or lung: Secondary | ICD-10-CM | POA: Insufficient documentation

## 2019-01-28 MED ORDER — SODIUM CHLORIDE (PF) 0.9 % IJ SOLN
INTRAMUSCULAR | Status: AC
  Filled 2019-01-28: qty 50

## 2019-01-28 MED ORDER — IOHEXOL 300 MG/ML  SOLN
100.0000 mL | Freq: Once | INTRAMUSCULAR | Status: AC | PRN
  Administered 2019-01-28: 100 mL via INTRAVENOUS

## 2019-01-29 ENCOUNTER — Telehealth: Payer: Self-pay | Admitting: Family Medicine

## 2019-01-29 NOTE — Telephone Encounter (Signed)
Discussed results with patient (BMP and Mg) over the phone on 6/10 after she had already presented to the ED on 6/9 due to critical potassium of 6.7.  BMP with magnesium was rechecked in the ED, potassium within normal limits without any treatment required and magnesium normal. EKG w/o peaked T-waves. Both supplementations were discontinued, recommended patient to continue with this.  She has an office visit with her pulmonologist on 6/17, patient to see if she can get labs (BMP and magnesium) through their office on that day to avoid an additional visit to our clinic for labs only.  I will be monitoring for these results to return, if not completed we will have her come in to recheck.  Patient agreed and endorsed understanding.  Patriciaann Clan, DO

## 2019-02-04 ENCOUNTER — Telehealth: Payer: Self-pay | Admitting: Radiation Oncology

## 2019-02-04 ENCOUNTER — Encounter: Payer: Self-pay | Admitting: Primary Care

## 2019-02-04 ENCOUNTER — Other Ambulatory Visit: Payer: Self-pay

## 2019-02-04 ENCOUNTER — Ambulatory Visit (INDEPENDENT_AMBULATORY_CARE_PROVIDER_SITE_OTHER): Payer: Medicare Other | Admitting: Primary Care

## 2019-02-04 DIAGNOSIS — E871 Hypo-osmolality and hyponatremia: Secondary | ICD-10-CM | POA: Diagnosis not present

## 2019-02-04 DIAGNOSIS — C3412 Malignant neoplasm of upper lobe, left bronchus or lung: Secondary | ICD-10-CM

## 2019-02-04 NOTE — Telephone Encounter (Signed)
I had a message from the patient's daughter and I called her back to review that her case was just discussed this am in GI conference. Recommendations were to proceed with CT biopsy of the left retroperitoneal nodes. We anticipate a need to refer to med onc for systemic therapy but current working dx is either progressive metastatic lung cancer or lymphoma. Dr. Vernard Gambles and Dr. Laurence Ferrari are aware of this case. I will call her as soon as we have path.

## 2019-02-04 NOTE — Patient Instructions (Addendum)
  Continue Bevespi 2 puffs twice daily   Albuterol rescue inahler/neb every 6 hours for shortness of breath   Continue prednisone taper until finished  Needs repeat labs this week (any time from 9-5pm)  Follow-up in 6-8 weeks with Dr. Vaughan Browner only please

## 2019-02-04 NOTE — Progress Notes (Signed)
Virtual Visit via Telephone Note  I connected with Pamela Russell on 02/04/19 at  1:30 PM EDT by telephone and verified that I am speaking with the correct person using two identifiers.  Location: Patient: Home Provider: Office   I discussed the limitations, risks, security and privacy concerns of performing an evaluation and management service by telephone and the availability of in person appointments. I also discussed with the patient that there may be a patient responsible charge related to this service. The patient expressed understanding and agreed to proceed.   History of Present Illness: 73 year old female, current smoker. PMH significant for tobacco abuse, hypertension, hyperlipidemia, COPD, multiple lung nodules, malignant neoplasm right upper lobe lung. Patient of Dr. Vaughan Browner. Given sample of Bevespi during last OV in March.   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 Patient met with Dr. Roxan Hockey in December 2019 and was not interested in having surgical resection. Referred to Dr. Lisbeth Renshaw with radiation oncology for SBRT treatment in January. Repeat CT chest 01/16/19 showed unchanged right lung nodules, prior left suprahilar nodules no longer visualized. Suspected early localized radiation changes. Incidental findings extensive lymphadenopathy in the posterior mediastinum and centered in the upper abdomen- worrisome for lymphoma or nodal metastases for an unidentified abdominal malignancy. Patient was updated on these findings per radiation/oncology and referred to GI conference on 6/17.   01/21/2019 Patient presents today for acute visit with complaints of shortness of breath x3-4 weeks. States that her breathing has worsened since radiation treatment. Reports that her oxygen drops to 87-89% with activity at  home. Continues taking Bevespi as prescribed. She has a nebulizer at home and used it a few days ago. She does not cough that often, when she does it's early in the morning and clear. Occasionally gets dizzy with position changes. Unable to walk >25 feet d/t deconditioning, using wheelchair today. Denies fever or sick contact. Labs return showed critical lab results, NA 117 and K 2.9. Advised ED.  02/04/2019 Patient called today for televisit. She has been to the ED twice for electrolyte imbalances (01/21/19 and 01/27/19). Hyperkalemia at lab draw and family practice office on 6/8 suspect error or hemolyzed specimen. EKG showed no evidence of hyperkalemia, repeat labs K 4.7. Sodium up to 131. Ok to stop potassium and magnesium supplement.  Recommend close follow-up with PCP. Needs repeat BMET and Mag.   Patient is doing well today, states that she is feeling really good. Breathing has improved. Seen on 6/3 for increased shortness of breath after radiation. 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 She is only half pill today and has 5 days left. Still using Bevespi twice daily. Hasn't required her Albuterol rescue inhaler, uses occasionally out of habit.    Observations/Objective:  Patient reports: - O2 saturation 91-95% RA  Assessment and Plan:  Post-radiation pneumonitis - Improvement in shortness of breath  - Continues prolonged prednisone taper until 6/23 - Advised patient to let us know if sob returns after steroid completed   Chronic obstructive pulmonary disease - Continue Bevespi 2 puffs twice daily - Albuterol hfa/neb q 4-6 prn shortness of breathing/wheezing   Hyponatremia/hypokalemia - Most recent K 4.7, NA 131  - Discontinued potassium and magnesium supplement - Recheck BMET and Mag this week   Follow Up Instructions:  -  6-8 weeks with Dr. Vaughan Browner OR sooner if needed  I discussed the assessment and treatment plan with the patient. The patient  was provided an opportunity to ask questions and all were answered. The patient agreed with the plan and demonstrated an understanding of the instructions.   The patient was advised to call back or seek an in-person evaluation if the symptoms worsen or if the condition fails to improve as anticipated.  I provided 15 minutes of non-face-to-face time during this encounter.   Martyn Ehrich, NP

## 2019-02-06 ENCOUNTER — Other Ambulatory Visit (INDEPENDENT_AMBULATORY_CARE_PROVIDER_SITE_OTHER): Payer: Medicare Other

## 2019-02-06 DIAGNOSIS — E871 Hypo-osmolality and hyponatremia: Secondary | ICD-10-CM | POA: Diagnosis not present

## 2019-02-06 LAB — BASIC METABOLIC PANEL
BUN: 6 mg/dL (ref 6–23)
CO2: 25 mEq/L (ref 19–32)
Calcium: 9.6 mg/dL (ref 8.4–10.5)
Chloride: 88 mEq/L — ABNORMAL LOW (ref 96–112)
Creatinine, Ser: 0.45 mg/dL (ref 0.40–1.20)
GFR: 136.87 mL/min (ref >60.00)
Glucose, Bld: 113 mg/dL — ABNORMAL HIGH (ref 70–99)
Potassium: 3.9 mEq/L (ref 3.5–5.1)
Sodium: 126 mEq/L — ABNORMAL LOW (ref 135–145)

## 2019-02-06 LAB — MAGNESIUM: Magnesium: 1.4 mg/dL — ABNORMAL LOW (ref 1.5–2.5)

## 2019-02-09 NOTE — Progress Notes (Signed)
Please let patient know Na 126, Mag 1.4. Potassium normal. Needs to follow up with PCP.

## 2019-02-11 ENCOUNTER — Other Ambulatory Visit: Payer: Self-pay

## 2019-02-11 ENCOUNTER — Telehealth (INDEPENDENT_AMBULATORY_CARE_PROVIDER_SITE_OTHER): Payer: Medicare Other | Admitting: Family Medicine

## 2019-02-11 ENCOUNTER — Telehealth: Payer: Self-pay | Admitting: Family Medicine

## 2019-02-11 DIAGNOSIS — J309 Allergic rhinitis, unspecified: Secondary | ICD-10-CM

## 2019-02-11 DIAGNOSIS — E878 Other disorders of electrolyte and fluid balance, not elsewhere classified: Secondary | ICD-10-CM

## 2019-02-11 DIAGNOSIS — H532 Diplopia: Secondary | ICD-10-CM

## 2019-02-11 DIAGNOSIS — I10 Essential (primary) hypertension: Secondary | ICD-10-CM

## 2019-02-11 MED ORDER — FLUTICASONE PROPIONATE 50 MCG/ACT NA SUSP: 2.0000 | Freq: Every day | NASAL | 1 refills | Status: DC

## 2019-02-11 NOTE — Telephone Encounter (Signed)
Contacted [name] regarding office visit earlier today for diplopia.  Patient reports double vision out of her left eye over the last week after being evaluated for metabolic derangements thought to be related to her underlying pulmonary cancer.  There are concerns that there is malignancy based on a recent abdominal and pelvic CT, however she has no recent head or CNS imaging.  Patient denies any other neuro deficits via tele-visit by Dr. Higinio Plan, however she continues to have double vision and was resistant to going to the [facility] earlier today.  I did discuss going to [place] [facility] to be worked up, however patient says that her daughter Pamela Russell.  Patient did give permission for me to contact her daughter to discuss plan.  Reviewed red flags and reasons to call 911.  I did call patient's daughter with no answer.  LVM.  Pamela Russell, [place], PGY-3

## 2019-02-11 NOTE — Assessment & Plan Note (Signed)
SBP within range, average 130.  Will maintain patient on amlodipine 5 mg.  Recommend continue monitoring blood pressure every couple of days at home.

## 2019-02-11 NOTE — Telephone Encounter (Signed)
Patients daughter returning [place] phone call. I explained the need for her to have ED work for the various reasons listed. Sissy stated her mom is very stubborn and does not want to ho right now, she will go in the morning. Sissy then mentioned her mom has an apt with PCP tomorrow at 3pm and will bring her here for "convincing" if her mom does not agree to go to ED this evening or tomorrow morning. I stressed the importance of going and informed that's all we would be doing here. ED precautions given. You can call Sissy with any further concerns.   [phone]

## 2019-02-11 NOTE — Assessment & Plan Note (Addendum)
Main concern is continued hyponatremia, most recently 126 on 6/19 2020 (low of 117 on 6/3 but improved after HCTZ discontinuation).  Suspect she is symptomatic with fatigue, muscle cramps, and concern for neurological changes discussed above.  Differential including thiazide induced hyponatremia, paraneoplastic syndrome with SIADH due to current malignancy, "tea and toast" potomania given poor diet.  Additional indication for recommending her to be seen today for repeat labs given symptomatology, however will plan for labs tomorrow.  She will also be contacting her oncologist to schedule lymph node biopsy.  - Obtain BMP, TSH on 6/25, consider repeat urine sodium/osmolality - Discussed strict precautions with patient and daughter, including the development of worsening malaise, confusion, seizure, dizziness, headache to present to ED - Encouraged use of diet supplementation such as Ensure/boost to provide improved nutrition

## 2019-02-11 NOTE — Assessment & Plan Note (Signed)
Rhinorrhea and watery, itchy eyes sounds consistent with allergic rhinitis.  Could consider viral URI vs rhinosinusitis, however low concern for bacterial source.  Already takes Zyrtec on a daily basis. - Start Flonase nasal spray - Continue home Zyrtec - Recommended supportive care, staying hydrated, staying with showers etc. - Follow-up if symptoms not improving or worsening

## 2019-02-11 NOTE — Assessment & Plan Note (Addendum)
Nonprogressive 5-day history, present in left eye associated with ptosis.  Broad differential including hyponatremia, ophthalmologic misalignment, neuromuscular disorder such as MG, Horner syndrome, CVA.  Difficult to fully assess via telephone, however reassured no additional symptomatology concerning for CVA or facial weakness to suggest Bell's palsy.  Low concern for precipitating ocular infection without associated swelling, eye drainage, or conjunctival injection. Recommended and highly encouraged patient to be evaluated today in our office/ophthalmologist/in the ED and discussed the possibility of differential as above, however states today is not an option for her and could only be seen tomorrow.  Discussed symptoms including but not limited to facial weakness/drooping, extremity weakness, vision loss, headache, confusion, or AMS to present to the ED immediately.  She endorsed understanding and agreed, she will schedule an afternoon appointment for tomorrow in our office, 6/25.

## 2019-02-11 NOTE — Progress Notes (Signed)
[place] Telemedicine Visit  Patient consented to have virtual visit. Method of visit: Video was attempted, but technology challenges prevented patient from using video, so visit was conducted via telephone.  Encounter participants: Patient: Pamela Russell - located at home  Provider: Patriciaann Clan - located at [facility] Others (if applicable): Daughter, Sissy  Chief Complaint: bp f/u   HPI: Pamela Russell is a 73 year old female presenting via telemedicine for a hypertension follow-up and to discuss the following:  Hypertension: Currently on amlodipine 5 mg after discontinuing HCTZ due to hyponatremia and hypotension.  While blood pressure has appeared elevated in recent ED visits, states her blood pressure has been around 413 systolics, 24M diastolic at home when checking on a daily basis.  Tolerating amlodipine well, no lower extremity swelling.  Metabolic derangements: Been following her hyponatremia and hypomagnesemia.  Labs collected on 6/19 showing a sodium of 126, magnesium 1.4, potassium 3.9.  Patient and daughter states she has been very fatigued the last several weeks and occasionally will have muscle cramps. Denies any headache, dizziness, confusion, gait changes.   Additionally, patient endorses a 5-day history of runny nose, watery/itchy eyes, increased phlegm, and her left eye stays closed.  Denies any associated fever, sore throat, coughing, sinus pressure/fullness, ear pain, shortness of breath.  Says she has to manually open her left eye.  States her vision is normal if she closes her right eye, however of both her left and right eye are open she endorses double vision.  Denies any facial drooping, focal weakness, numbness, eye swelling/pain, unilateral sweating, conjunctival injection, or eye drainage.  Since onset, eye symptoms have been unchanged, not worsening. She believes this is secondary to a sinus infection and would like to have something to help  with this.  Recently had a CT abdomen and pelvis showing extensive confluent adenopathy throughout posterior lower mediastinum compatible with metastatic disease from lung malignancy.  Her oncologist office recommended biopsy of these nodes within her abdomen, however she has not heard back about a specific date since last speaking with them 1 week ago.   ROS: per HPI  Pertinent PMHx: Malignant neoplasm of right/left upper lobe, hypertension, hyponatremia  Exam:  Respiratory: Unlabored breathing, no coughing, sounds nasally congested  Assessment/Plan: Discussed patient case with preceptor, Dr. Dorris Singh  Diplopia Nonprogressive 5-day history, present in left eye associated with ptosis.  Broad differential including hyponatremia, ophthalmologic misalignment, neuromuscular disorder such as MG, Horner syndrome, CVA.  Difficult to fully assess via telephone, however reassured no additional symptomatology concerning for CVA or facial weakness to suggest Bell's palsy.  Low concern for precipitating ocular infection without associated swelling, eye drainage, or conjunctival injection. Recommended and highly encouraged patient to be evaluated today in our office/ophthalmologist/in the ED and discussed the possibility of differential as above, however states today is not an option for her and could only be seen tomorrow.  Discussed symptoms including but not limited to facial weakness/drooping, extremity weakness, vision loss, headache, confusion, or AMS to present to the ED immediately.  She endorsed understanding and agreed, she will schedule an afternoon appointment for tomorrow in our office, 6/25.  Electrolyte disturbance Main concern is continued hyponatremia, most recently 126 on 6/19 2020 (low of 117 on 6/3 but improved after HCTZ discontinuation).  Suspect she is symptomatic with fatigue, muscle cramps, and concern for neurological changes discussed above.  Differential including thiazide  induced hyponatremia, paraneoplastic syndrome with SIADH due to current malignancy, "tea and toast" potomania given  poor diet.  Additional indication for recommending her to be seen today for repeat labs given symptomatology, however will plan for labs tomorrow.  She will also be contacting her oncologist to schedule lymph node biopsy.  - Obtain BMP, TSH on 6/25, consider repeat urine sodium/osmolality - Discussed strict precautions with patient and daughter, including the development of worsening malaise, confusion, seizure, dizziness, headache to present to ED - Encouraged use of diet supplementation such as Ensure/boost to provide improved nutrition  Allergic rhinitis Rhinorrhea and watery, itchy eyes sounds consistent with allergic rhinitis.  Could consider viral URI vs rhinosinusitis, however low concern for bacterial source.  Already takes Zyrtec on a daily basis. - Start Flonase nasal spray - Continue home Zyrtec - Recommended supportive care, staying hydrated, staying with showers etc. - Follow-up if symptoms not improving or worsening  Primary hypertension SBP within range, average 130.  Will maintain patient on amlodipine 5 mg.  Recommend continue monitoring blood pressure every couple of days at home.   Time spent during visit with patient: 25 minutes  Patriciaann Clan, DO

## 2019-02-12 ENCOUNTER — Inpatient Hospital Stay (HOSPITAL_COMMUNITY)
Admission: EM | Admit: 2019-02-12 | Discharge: 2019-02-16 | DRG: 824 | Disposition: A | Discharge status: Home / Self Care | Payer: Medicare Other | Attending: Internal Medicine | Admitting: Internal Medicine

## 2019-02-12 ENCOUNTER — Other Ambulatory Visit: Payer: Self-pay

## 2019-02-12 ENCOUNTER — Encounter (HOSPITAL_COMMUNITY): Payer: Self-pay

## 2019-02-12 ENCOUNTER — Emergency Department (HOSPITAL_COMMUNITY): Payer: Medicare Other

## 2019-02-12 ENCOUNTER — Ambulatory Visit: Payer: Medicare Other | Admitting: Family Medicine

## 2019-02-12 DIAGNOSIS — K5900 Constipation, unspecified: Secondary | ICD-10-CM | POA: Diagnosis present

## 2019-02-12 DIAGNOSIS — I10 Essential (primary) hypertension: Secondary | ICD-10-CM | POA: Diagnosis present

## 2019-02-12 DIAGNOSIS — C3411 Malignant neoplasm of upper lobe, right bronchus or lung: Secondary | ICD-10-CM | POA: Diagnosis not present

## 2019-02-12 DIAGNOSIS — C7972 Secondary malignant neoplasm of left adrenal gland: Secondary | ICD-10-CM | POA: Diagnosis present

## 2019-02-12 DIAGNOSIS — Z7982 Long term (current) use of aspirin: Secondary | ICD-10-CM | POA: Diagnosis not present

## 2019-02-12 DIAGNOSIS — D72829 Elevated white blood cell count, unspecified: Secondary | ICD-10-CM | POA: Diagnosis not present

## 2019-02-12 DIAGNOSIS — H02402 Unspecified ptosis of left eyelid: Secondary | ICD-10-CM | POA: Diagnosis present

## 2019-02-12 DIAGNOSIS — E871 Hypo-osmolality and hyponatremia: Secondary | ICD-10-CM | POA: Diagnosis not present

## 2019-02-12 DIAGNOSIS — G939 Disorder of brain, unspecified: Secondary | ICD-10-CM | POA: Diagnosis present

## 2019-02-12 DIAGNOSIS — Z85118 Personal history of other malignant neoplasm of bronchus and lung: Secondary | ICD-10-CM | POA: Diagnosis not present

## 2019-02-12 DIAGNOSIS — R630 Anorexia: Secondary | ICD-10-CM | POA: Diagnosis present

## 2019-02-12 DIAGNOSIS — H4902 Third [oculomotor] nerve palsy, left eye: Secondary | ICD-10-CM

## 2019-02-12 DIAGNOSIS — E785 Hyperlipidemia, unspecified: Secondary | ICD-10-CM | POA: Diagnosis present

## 2019-02-12 DIAGNOSIS — Z923 Personal history of irradiation: Secondary | ICD-10-CM

## 2019-02-12 DIAGNOSIS — N1330 Unspecified hydronephrosis: Secondary | ICD-10-CM | POA: Diagnosis present

## 2019-02-12 DIAGNOSIS — E222 Syndrome of inappropriate secretion of antidiuretic hormone: Secondary | ICD-10-CM | POA: Diagnosis present

## 2019-02-12 DIAGNOSIS — Z885 Allergy status to narcotic agent status: Secondary | ICD-10-CM

## 2019-02-12 DIAGNOSIS — E876 Hypokalemia: Secondary | ICD-10-CM | POA: Diagnosis present

## 2019-02-12 DIAGNOSIS — Z1159 Encounter for screening for other viral diseases: Secondary | ICD-10-CM | POA: Diagnosis not present

## 2019-02-12 DIAGNOSIS — F17200 Nicotine dependence, unspecified, uncomplicated: Secondary | ICD-10-CM | POA: Diagnosis present

## 2019-02-12 DIAGNOSIS — Z90710 Acquired absence of both cervix and uterus: Secondary | ICD-10-CM | POA: Diagnosis not present

## 2019-02-12 DIAGNOSIS — C772 Secondary and unspecified malignant neoplasm of intra-abdominal lymph nodes: Principal | ICD-10-CM | POA: Diagnosis present

## 2019-02-12 DIAGNOSIS — Z79899 Other long term (current) drug therapy: Secondary | ICD-10-CM

## 2019-02-12 DIAGNOSIS — Z8249 Family history of ischemic heart disease and other diseases of the circulatory system: Secondary | ICD-10-CM

## 2019-02-12 DIAGNOSIS — H02409 Unspecified ptosis of unspecified eyelid: Secondary | ICD-10-CM | POA: Diagnosis present

## 2019-02-12 DIAGNOSIS — Z66 Do not resuscitate: Secondary | ICD-10-CM | POA: Diagnosis present

## 2019-02-12 DIAGNOSIS — Z87891 Personal history of nicotine dependence: Secondary | ICD-10-CM | POA: Diagnosis not present

## 2019-02-12 DIAGNOSIS — E46 Unspecified protein-calorie malnutrition: Secondary | ICD-10-CM | POA: Diagnosis not present

## 2019-02-12 DIAGNOSIS — Z823 Family history of stroke: Secondary | ICD-10-CM

## 2019-02-12 DIAGNOSIS — Z7951 Long term (current) use of inhaled steroids: Secondary | ICD-10-CM

## 2019-02-12 DIAGNOSIS — C799 Secondary malignant neoplasm of unspecified site: Secondary | ICD-10-CM

## 2019-02-12 DIAGNOSIS — Z880 Allergy status to penicillin: Secondary | ICD-10-CM

## 2019-02-12 DIAGNOSIS — H532 Diplopia: Secondary | ICD-10-CM | POA: Diagnosis present

## 2019-02-12 DIAGNOSIS — G9389 Other specified disorders of brain: Secondary | ICD-10-CM

## 2019-02-12 DIAGNOSIS — Z888 Allergy status to other drugs, medicaments and biological substances status: Secondary | ICD-10-CM

## 2019-02-12 DIAGNOSIS — J449 Chronic obstructive pulmonary disease, unspecified: Secondary | ICD-10-CM | POA: Diagnosis present

## 2019-02-12 DIAGNOSIS — C3490 Malignant neoplasm of unspecified part of unspecified bronchus or lung: Secondary | ICD-10-CM

## 2019-02-12 DIAGNOSIS — T380X5A Adverse effect of glucocorticoids and synthetic analogues, initial encounter: Secondary | ICD-10-CM | POA: Diagnosis present

## 2019-02-12 DIAGNOSIS — Z833 Family history of diabetes mellitus: Secondary | ICD-10-CM

## 2019-02-12 DIAGNOSIS — R590 Localized enlarged lymph nodes: Secondary | ICD-10-CM | POA: Diagnosis not present

## 2019-02-12 LAB — RAPID URINE DRUG SCREEN, HOSP PERFORMED
Amphetamines: NOT DETECTED
Barbiturates: NOT DETECTED
Benzodiazepines: NOT DETECTED
Cocaine: NOT DETECTED
Opiates: NOT DETECTED
Tetrahydrocannabinol: NOT DETECTED

## 2019-02-12 LAB — BASIC METABOLIC PANEL
Anion gap: 13 (ref 5–15)
BUN: <5 mg/dL — ABNORMAL LOW (ref 8–23)
CO2: 21 mmol/L — ABNORMAL LOW (ref 22–32)
Calcium: 8.4 mg/dL — ABNORMAL LOW (ref 8.9–10.3)
Chloride: 88 mmol/L — ABNORMAL LOW (ref 98–111)
Creatinine, Ser: <0.3 mg/dL — ABNORMAL LOW (ref 0.44–1.00)
Glucose, Bld: 101 mg/dL — ABNORMAL HIGH (ref 70–99)
Potassium: 3.6 mmol/L (ref 3.5–5.1)
Sodium: 122 mmol/L — ABNORMAL LOW (ref 135–145)

## 2019-02-12 LAB — URINALYSIS, ROUTINE W REFLEX MICROSCOPIC
Bilirubin Urine: NEGATIVE
Glucose, UA: NEGATIVE mg/dL
Hgb urine dipstick: NEGATIVE
Ketones, ur: 20 mg/dL — AB
Leukocytes,Ua: NEGATIVE
Nitrite: NEGATIVE
Protein, ur: NEGATIVE mg/dL
Specific Gravity, Urine: 1.004 — ABNORMAL LOW (ref 1.005–1.030)
pH: 6 (ref 5.0–8.0)

## 2019-02-12 LAB — COMPREHENSIVE METABOLIC PANEL
ALT: 11 U/L (ref 0–44)
AST: 58 U/L — ABNORMAL HIGH (ref 15–41)
Albumin: 3.8 g/dL (ref 3.5–5.0)
Alkaline Phosphatase: 71 U/L (ref 38–126)
Anion gap: 10 (ref 5–15)
BUN: 7 mg/dL — ABNORMAL LOW (ref 8–23)
CO2: 27 mmol/L (ref 22–32)
Calcium: 8.7 mg/dL — ABNORMAL LOW (ref 8.9–10.3)
Chloride: 81 mmol/L — ABNORMAL LOW (ref 98–111)
Creatinine, Ser: 0.31 mg/dL — ABNORMAL LOW (ref 0.44–1.00)
GFR calc Af Amer: >60 mL/min (ref >60)
GFR calc non Af Amer: >60 mL/min (ref >60)
Glucose, Bld: 90 mg/dL (ref 70–99)
Potassium: 3 mmol/L — ABNORMAL LOW (ref 3.5–5.1)
Sodium: 118 mmol/L — CL (ref 135–145)
Total Bilirubin: 0.7 mg/dL (ref 0.3–1.2)
Total Protein: 7 g/dL (ref 6.5–8.1)

## 2019-02-12 LAB — CBC
HCT: 38.7 % (ref 36.0–46.0)
Hemoglobin: 13.9 g/dL (ref 12.0–15.0)
MCH: 32.1 pg (ref 26.0–34.0)
MCHC: 35.9 g/dL (ref 30.0–36.0)
MCV: 89.4 fL (ref 80.0–100.0)
Platelets: 282 10*3/uL (ref 150–400)
RBC: 4.33 MIL/uL (ref 3.87–5.11)
RDW: 13.2 % (ref 11.5–15.5)
WBC: 6.4 10*3/uL (ref 4.0–10.5)
nRBC: 0 % (ref 0.0–0.2)

## 2019-02-12 LAB — APTT: aPTT: 31 seconds (ref 24–36)

## 2019-02-12 LAB — DIFFERENTIAL
Abs Immature Granulocytes: 0.04 10*3/uL (ref 0.00–0.07)
Basophils Absolute: 0.1 10*3/uL (ref 0.0–0.1)
Basophils Relative: 1 %
Eosinophils Absolute: 0.1 10*3/uL (ref 0.0–0.5)
Eosinophils Relative: 2 %
Immature Granulocytes: 1 %
Lymphocytes Relative: 21 %
Lymphs Abs: 1.3 10*3/uL (ref 0.7–4.0)
Monocytes Absolute: 0.7 10*3/uL (ref 0.1–1.0)
Monocytes Relative: 10 %
Neutro Abs: 4.2 10*3/uL (ref 1.7–7.7)
Neutrophils Relative %: 65 %

## 2019-02-12 LAB — I-STAT CHEM 8, ED
BUN: 5 mg/dL — ABNORMAL LOW (ref 8–23)
Calcium, Ion: 1.07 mmol/L — ABNORMAL LOW (ref 1.15–1.40)
Chloride: 77 mmol/L — ABNORMAL LOW (ref 98–111)
Creatinine, Ser: 0.4 mg/dL — ABNORMAL LOW (ref 0.44–1.00)
Glucose, Bld: 90 mg/dL (ref 70–99)
HCT: 43 % (ref 36.0–46.0)
Hemoglobin: 14.6 g/dL (ref 12.0–15.0)
Potassium: 3.2 mmol/L — ABNORMAL LOW (ref 3.5–5.1)
Sodium: 115 mmol/L — CL (ref 135–145)
TCO2: 28 mmol/L (ref 22–32)

## 2019-02-12 LAB — PROTIME-INR
INR: 0.8 (ref 0.8–1.2)
Prothrombin Time: 11.5 seconds (ref 11.4–15.2)

## 2019-02-12 LAB — CORTISOL: Cortisol, Plasma: 3.7 ug/dL

## 2019-02-12 LAB — SODIUM, URINE, RANDOM: Sodium, Ur: 21 mmol/L

## 2019-02-12 LAB — MAGNESIUM: Magnesium: 1.7 mg/dL (ref 1.7–2.4)

## 2019-02-12 LAB — TSH: TSH: 0.46 u[IU]/mL (ref 0.350–4.500)

## 2019-02-12 LAB — ETHANOL: Alcohol, Ethyl (B): <10 mg/dL (ref <10)

## 2019-02-12 MED ORDER — SODIUM CHLORIDE 0.9 % IV SOLN
INTRAVENOUS | Status: DC
  Administered 2019-02-12 (×2): via INTRAVENOUS

## 2019-02-12 MED ORDER — ACETAMINOPHEN 325 MG PO TABS: 650.0000 mg | ORAL_TABLET | Freq: Four times a day (QID) | ORAL | Status: DC | PRN

## 2019-02-12 MED ORDER — DEXAMETHASONE SODIUM PHOSPHATE 4 MG/ML IJ SOLN
4.0000 mg | Freq: Four times a day (QID) | INTRAMUSCULAR | Status: DC
  Administered 2019-02-12 – 2019-02-16 (×13): 4 mg via INTRAVENOUS
  Filled 2019-02-12 (×13): qty 1

## 2019-02-12 MED ORDER — LORAZEPAM 2 MG/ML IJ SOLN
1.0000 mg | INTRAMUSCULAR | Status: DC | PRN
  Administered 2019-02-12: 1 mg via INTRAVENOUS
  Filled 2019-02-12: qty 1

## 2019-02-12 MED ORDER — POTASSIUM CHLORIDE CRYS ER 20 MEQ PO TBCR
40.0000 meq | EXTENDED_RELEASE_TABLET | Freq: Once | ORAL | Status: AC
  Administered 2019-02-12: 40 meq via ORAL
  Filled 2019-02-12: qty 2

## 2019-02-12 MED ORDER — NON FORMULARY: 2.0000 | Freq: Two times a day (BID) | Status: DC

## 2019-02-12 MED ORDER — LORATADINE 10 MG PO TABS
10.0000 mg | ORAL_TABLET | Freq: Every day | ORAL | Status: DC
  Administered 2019-02-13 – 2019-02-15 (×3): 10 mg via ORAL
  Filled 2019-02-12 (×4): qty 1

## 2019-02-12 MED ORDER — GLYCOPYRROLATE-FORMOTEROL 9-4.8 MCG/ACT IN AERO
2.0000 | INHALATION_SPRAY | Freq: Two times a day (BID) | RESPIRATORY_TRACT | Status: DC
  Administered 2019-02-13 – 2019-02-16 (×5): 2 via RESPIRATORY_TRACT
  Filled 2019-02-12: qty 1

## 2019-02-12 MED ORDER — ALBUTEROL SULFATE HFA 108 (90 BASE) MCG/ACT IN AERS
2.0000 | INHALATION_SPRAY | Freq: Four times a day (QID) | RESPIRATORY_TRACT | Status: DC | PRN
  Administered 2019-02-14 – 2019-02-15 (×3): 2 via RESPIRATORY_TRACT
  Filled 2019-02-12: qty 6.7

## 2019-02-12 MED ORDER — FLUTICASONE PROPIONATE 50 MCG/ACT NA SUSP
2.0000 | Freq: Every day | NASAL | Status: DC
  Administered 2019-02-14 – 2019-02-16 (×3): 2 via NASAL
  Filled 2019-02-12 (×3): qty 16

## 2019-02-12 MED ORDER — DEXAMETHASONE SODIUM PHOSPHATE 10 MG/ML IJ SOLN
10.0000 mg | Freq: Once | INTRAMUSCULAR | Status: AC
  Administered 2019-02-12: 10 mg via INTRAVENOUS
  Filled 2019-02-12: qty 1

## 2019-02-12 MED ORDER — ENOXAPARIN SODIUM 40 MG/0.4ML ~~LOC~~ SOLN
40.0000 mg | SUBCUTANEOUS | Status: DC
  Administered 2019-02-12 – 2019-02-14 (×3): 40 mg via SUBCUTANEOUS
  Filled 2019-02-12 (×4): qty 0.4

## 2019-02-12 MED ORDER — MAGNESIUM SULFATE 2 GM/50ML IV SOLN
2.0000 g | Freq: Once | INTRAVENOUS | Status: AC
  Administered 2019-02-12: 2 g via INTRAVENOUS
  Filled 2019-02-12: qty 50

## 2019-02-12 MED ORDER — ACETAMINOPHEN 650 MG RE SUPP: 650.0000 mg | Freq: Four times a day (QID) | RECTAL | Status: DC | PRN

## 2019-02-12 NOTE — ED Notes (Signed)
CRITICAL VALUE STICKER  CRITICAL VALUE: Sodium 118  DATE & TIME NOTIFIED: 02/12/19 1532  MD NOTIFIED: Eulis Foster  TIME OF NOTIFICATION: 1533

## 2019-02-12 NOTE — ED Notes (Signed)
ED TO INPATIENT HANDOFF REPORT  ED Nurse Name and Phone #: Christinia Gully Name/Age/Gender [name] 73 y.o. female Room/Bed: WA06/WA06  Code Status   Code Status: Full Code  Home/SNF/Other given to floor Patient oriented to: self, place, time and situation Is this baseline? Yes   Triage Complete: Triage complete  Chief Complaint cant open eye  Triage Note Pt states that she cannot open her left eye since Friday. Pt states that when she forces it open, she has double vision.    Allergies Allergies  Allergen Reactions  . Codeine Anaphylaxis and Swelling  . Penicillins Anaphylaxis and Swelling    Childhood reaction. Has patient had a PCN reaction causing immediate rash, facial/tongue/throat swelling, SOB or lightheadedness with hypotension: Yes Has patient had a PCN reaction causing severe rash involving mucus membranes or skin necrosis: No Has patient had a PCN reaction that required hospitalization: No Has patient had a PCN reaction occurring within the last 10 years: No If all of the above answers are "NO", then may proceed with Cephalosporin use.   Celedonio Miyamoto [Umeclidinium-Vilanterol] Nausea And Vomiting    Severe     Level of Care/Admitting Diagnosis ED Disposition    ED Disposition Condition Comment   Admit  Hospital Area: [facility] [[id number]]  Level of Care: Telemetry [5]  Admit to tele based on following criteria: Monitor for Ischemic changes  Covid Evaluation: Screening Protocol (No Symptoms)  Diagnosis: Hyponatremia [841324]  Admitting Physician: Jani Gravel [[id number]]  Attending Physician: Jani Gravel [phone]  Estimated length of stay: past midnight tomorrow  Certification:: I certify this patient will need inpatient services for at least 2 midnights  PT Class (Do Not Modify): Inpatient [101]  PT Acc Code (Do Not Modify): Private [1]       B Medical/Surgery History Past Medical History:  Diagnosis Date  . Allergic  rhinitis   . Asthma 2008  . Chronic obstructive pulmonary disease (COPD) ([place]) 06/08/2016   Meds: Spiriva, albuterol inhaler PFT (11/29/2016): Moderate-severe obstruction by PFT 11/2016  . Ear itching 07/05/2017  . Grieving 06/08/2016  . History of pneumonia    x4  . Hyperlipidemia 09/10/2016   On atorvastatin 40 mg QD, ASA 81 mg QD ASCVD risk 17.1%, mod-high intensity statin Risk factors: FHx sig for MI: mother 66s-50s, and father age 76, HTN, smoker  . Hypertension   . Night sweats 06/08/2016  . NSCL Ca dx'd 05/2018   xrt comp 09/2018  . Pulmonary nodules 06/12/2017  . Tobacco use disorder 07/06/2016   On nicotine replacement patch. Smoking cigs since age 14. Has 60 pack year history. Has COPD dx on SABA and LAMA.   Past Surgical History:  Procedure Laterality Date  . CATARACT EXTRACTION Left 05/08/2016  . CATARACT EXTRACTION Right 05/22/2016  . TOTAL ABDOMINAL HYSTERECTOMY W/ BILATERAL SALPINGOOPHORECTOMY Bilateral 1975  . VIDEO BRONCHOSCOPY WITH ENDOBRONCHIAL NAVIGATION Left 07/09/2018   Procedure: VIDEO BRONCHOSCOPY WITH ENDOBRONCHIAL NAVIGATION;  Surgeon: Collene Gobble, MD;  Location: [facility] OR;  Service: Thoracic;  Laterality: Left;     A IV Location/Drains/Wounds Patient Lines/Drains/Airways Status   Active Line/Drains/Airways    Name:   Placement date:   Placement time:   Site:   Days:   Peripheral IV 02/12/19 Right Antecubital   02/12/19    1350    Antecubital   less than 1          Intake/Output Last 24 hours  Intake/Output Summary (Last 24 hours) at 02/12/2019  2038 Last data filed at 02/12/2019 2038 Gross per 24 hour  Intake 386.67 ml  Output -  Net 386.67 ml    Labs/Imaging Results for orders placed or performed during the hospital encounter of 02/12/19 (from the past 48 hour(s))  Ethanol     Status: None   Collection Time: 02/12/19  1:51 PM  Result Value Ref Range   Alcohol, Ethyl (B) <10 <10 mg/dL    Comment: (NOTE) Lowest detectable limit for serum  alcohol is 10 mg/dL. For medical purposes only. Performed at [facility], [place] [address], [place], [place] [address]   Protime-INR     Status: None   Collection Time: 02/12/19  1:51 PM  Result Value Ref Range   Prothrombin Time 11.5 11.4 - 15.2 seconds   INR 0.8 0.8 - 1.2    Comment: (NOTE) INR goal varies based on device and disease states. Performed at [facility], [place] [address], [place], [place] [address]   APTT     Status: None   Collection Time: 02/12/19  1:51 PM  Result Value Ref Range   aPTT 31 24 - 36 seconds    Comment: Performed at [facility], [place] [address] [place], [place] [address]  CBC     Status: None   Collection Time: 02/12/19  1:51 PM  Result Value Ref Range   WBC 6.4 4.0 - 10.5 K/uL   RBC 4.33 3.87 - 5.11 MIL/uL   Hemoglobin 13.9 12.0 - 15.0 g/dL   HCT 38.7 36.0 - 46.0 %   MCV 89.4 80.0 - 100.0 fL   MCH 32.1 26.0 - 34.0 pg   MCHC 35.9 30.0 - 36.0 g/dL   RDW 13.2 11.5 - 15.5 %   Platelets 282 150 - 400 K/uL   nRBC 0.0 0.0 - 0.2 %    Comment: Performed at [facility], [place] [address], [place], [place] [address]  Differential     Status: None   Collection Time: 02/12/19  1:51 PM  Result Value Ref Range   Neutrophils Relative % 65 %   Neutro Abs 4.2 1.7 - 7.7 K/uL   Lymphocytes Relative 21 %   Lymphs Abs 1.3 0.7 - 4.0 K/uL   Monocytes Relative 10 %   Monocytes Absolute 0.7 0.1 - 1.0 K/uL   Eosinophils Relative 2 %   Eosinophils Absolute 0.1 0.0 - 0.5 K/uL   Basophils Relative 1 %   Basophils Absolute 0.1 0.0 - 0.1 K/uL   Immature Granulocytes 1 %   Abs Immature Granulocytes 0.04 0.00 - 0.07 K/uL    Comment: Performed at [facility], [place] [address] [place], [place] [address]  Comprehensive metabolic panel     Status: Abnormal   Collection Time: 02/12/19  1:51 PM  Result Value Ref Range   Sodium 118 (LL) 135 - 145 mmol/L    Comment: CRITICAL RESULT  CALLED TO, READ BACK BY AND VERIFIED WITH: C.FRANKLIN AT 1532 ON 02/12/19 BY N.THOMPSON    Potassium 3.0 (L) 3.5 - 5.1 mmol/L   Chloride 81 (L) 98 - 111 mmol/L   CO2 27 22 - 32 mmol/L   Glucose, Bld 90 70 - 99 mg/dL   BUN 7 (L) 8 - 23 mg/dL   Creatinine, Ser 0.31 (L) 0.44 - 1.00 mg/dL   Calcium 8.7 (L) 8.9 - 10.3 mg/dL   Total Protein 7.0 6.5 - 8.1 g/dL   Albumin 3.8 3.5 - 5.0 g/dL  AST 58 (H) 15 - 41 U/L   ALT 11 0 - 44 U/L   Alkaline Phosphatase 71 38 - 126 U/L   Total Bilirubin 0.7 0.3 - 1.2 mg/dL   GFR calc non Af Amer >60 >60 mL/min   GFR calc Af Amer >60 >60 mL/min   Anion gap 10 5 - 15    Comment: Performed at [facility], [place] [address], [place], [place] [address]  Urine rapid drug screen (hosp performed)     Status: None   Collection Time: 02/12/19  1:51 PM  Result Value Ref Range   Opiates NONE DETECTED NONE DETECTED   Cocaine NONE DETECTED NONE DETECTED   Benzodiazepines NONE DETECTED NONE DETECTED   Amphetamines NONE DETECTED NONE DETECTED   Tetrahydrocannabinol NONE DETECTED NONE DETECTED   Barbiturates NONE DETECTED NONE DETECTED    Comment: (NOTE) DRUG SCREEN FOR MEDICAL PURPOSES ONLY.  IF CONFIRMATION IS NEEDED FOR ANY PURPOSE, NOTIFY LAB WITHIN 5 DAYS. LOWEST DETECTABLE LIMITS FOR URINE DRUG SCREEN Drug Class                     Cutoff (ng/mL) Amphetamine and metabolites    1000 Barbiturate and metabolites    200 Benzodiazepine                 782 Tricyclics and metabolites     300 Opiates and metabolites        300 Cocaine and metabolites        300 THC                            50 Performed at [facility], [place] [address], [place], [place] [address]   Urinalysis, Routine w reflex microscopic     Status: Abnormal   Collection Time: 02/12/19  1:51 PM  Result Value Ref Range   Color, Urine YELLOW YELLOW   APPearance CLEAR CLEAR   Specific Gravity, Urine 1.004 (L) 1.005 - 1.030   pH 6.0 5.0 - 8.0   Glucose,  UA NEGATIVE NEGATIVE mg/dL   Hgb urine dipstick NEGATIVE NEGATIVE   Bilirubin Urine NEGATIVE NEGATIVE   Ketones, ur 20 (A) NEGATIVE mg/dL   Protein, ur NEGATIVE NEGATIVE mg/dL   Nitrite NEGATIVE NEGATIVE   Leukocytes,Ua NEGATIVE NEGATIVE    Comment: Performed at [place] [address] [place], [place] [address]  Magnesium     Status: None   Collection Time: 02/12/19  1:51 PM  Result Value Ref Range   Magnesium 1.7 1.7 - 2.4 mg/dL    Comment: Performed at [facility], [place] [address], [place], [place] [address]  I-stat chem 8, ED     Status: Abnormal   Collection Time: 02/12/19  2:05 PM  Result Value Ref Range   Sodium 115 (LL) 135 - 145 mmol/L   Potassium 3.2 (L) 3.5 - 5.1 mmol/L   Chloride 77 (L) 98 - 111 mmol/L   BUN 5 (L) 8 - 23 mg/dL   Creatinine, Ser 0.40 (L) 0.44 - 1.00 mg/dL   Glucose, Bld 90 70 - 99 mg/dL   Calcium, Ion 1.07 (L) 1.15 - 1.40 mmol/L   TCO2 28 22 - 32 mmol/L   Hemoglobin 14.6 12.0 - 15.0 g/dL   HCT 43.0 36.0 - 46.0 %   Comment NOTIFIED PHYSICIAN    Mr Brain Wo Contrast  Result Date: 02/12/2019 CLINICAL DATA:  73 year old  female with left 3rd nerve palsy for 6 days. History of lung cancer treated earlier this year. EXAM: MRI HEAD WITHOUT CONTRAST TECHNIQUE: Multiplanar, multiecho pulse sequences of the brain and surrounding structures were obtained without intravenous contrast. COMPARISON:  None. FINDINGS: Study is intermittently degraded by motion artifact despite repeated imaging attempts. Brain: There is a small focus of restricted diffusion in the inferior left occipital lobe white matter best seen on series 11, image 11. However, no brainstem diffusion abnormality is identified. Faint if any associated T2 and FLAIR hyperintensity in the left occipital lobe white matter. No associated hemorrhage or mass effect. However, the right temporal lobe is abnormal, with mass-like soft tissue thickening and architectural  distortion most apparent on series 12, image 15 and series 5, image 10. There is associated mass effect on the right temporal horn, although no ventriculomegaly. However, the abnormal parenchyma appears largely isointense to other gray and white matter signal. It is unclear whether the some of the right insula might also be affected and abnormally thickened (series 10, image 50). No midline shift. Basilar cisterns remain normal. The deep gray matter nuclei brainstem and cerebellum appear normal. No cortical encephalomalacia or chronic cerebral blood products. Cervicomedullary junction within normal limits. Vascular: Partial loss of the distal left vertebral artery flow void (series 5, image 2). Other Major intracranial vascular flow voids are preserved. Skull and upper cervical spine: Negative visible cervical spine. Visualized bone marrow signal is within normal limits. Sinuses/Orbits: The pituitary gland also appears at the upper limits of normal, but the suprasellar cistern is preserved. Normal noncontrast appearance of the cavernous sinuses. Postoperative changes to both globes, the intraorbital soft tissues appear normal. Paranasal sinuses are clear. Other: Mastoid air cells are clear. Visible internal auditory structures appear normal. Scalp and face soft tissues appear negative. IMPRESSION: 1. Intermittently motion degraded study despite premedication and repeated imaging attempts. 2. The dominant abnormality is mass-like enlargement of the right temporal lobe, although the affected area appears largely isointense to normal brain parenchyma. The right insula and perhaps also the pituitary gland may be affected. Main differential considerations at this point are an infiltrative tumor, and cortical dysplasia (although the latter would seem unlikely if there is no chronic seizure history). Recommend follow-up Brain MRI with sedation (or anesthesia), done without and with IV contrast. 3. Tiny acute white matter  infarct in the left occipital lobe. No brainstem ischemia or abnormality identified. 4. 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 Electronically Signed   By: Genevie Ann M.D.   On: 02/12/2019 15:13    Pending Labs Unresulted Labs (From admission, onward)    Start     Ordered   02/19/19 0500  Creatinine, serum  (enoxaparin (LOVENOX)    CrCl >/= 30 ml/min)  Weekly,   R    Comments: while on enoxaparin therapy    02/12/19 1933   02/13/19 0500  Comprehensive metabolic panel  Tomorrow morning,   R     02/12/19 1933   02/13/19 0500  CBC  Tomorrow morning,   R     02/12/19 1933   02/12/19 1925  Cortisol  Add-on,   AD     02/12/19 1933   02/12/19 1925  TSH  Add-on,   AD     02/12/19 1933   02/12/19 1925  Osmolality, urine  Once,   STAT     02/12/19 1933   02/12/19 1925  Osmolality  Add-on,   AD     02/12/19 1933  02/12/19 1924  Sodium, urine, random  Once,   STAT     02/12/19 1933   02/12/19 1328  Novel Coronavirus,NAA,(SEND-OUT TO REF LAB - TAT 24-48 hrs); Hosp Order  (Asymptomatic Patients Labs)  Once,   STAT    Question:  Rule Out  Answer:  Yes   02/12/19 1328          Vitals/Pain Today's Vitals   02/12/19 1630 02/12/19 1700 02/12/19 1730 02/12/19 1800  BP: (!) 146/62 (!) 151/58 110/79 (!) 131/58  Pulse: 80 80 82 81  Resp: 18 17 18 18   Temp:      TempSrc:      SpO2: 97% 95% 94% 95%  Weight:      Height:      PainSc:        Isolation Precautions No active isolations  Medications Medications  0.9 %  sodium chloride infusion ( Intravenous Stopped 02/12/19 2038)  LORazepam (ATIVAN) injection 1 mg (1 mg Intravenous Given 02/12/19 1354)  enoxaparin (LOVENOX) injection 40 mg (has no administration in time range)  acetaminophen (TYLENOL) tablet 650 mg (has no administration in time range)    Or  acetaminophen (TYLENOL) suppository 650 mg (has no administration in time range)  dexamethasone (DECADRON) injection 4 mg (has no administration in time range)   dexamethasone (DECADRON) injection 10 mg (10 mg Intravenous Given 02/12/19 1838)  magnesium sulfate IVPB 2 g 50 mL (0 g Intravenous Stopped 02/12/19 2038)  potassium chloride SA (K-DUR) CR tablet 40 mEq (40 mEq Oral Given 02/12/19 1841)    Mobility walks with person assist Low fall risk   Focused Assessments Neuro Assessment Handoff:  Swallow screen pass? Yes          Neuro Assessment: Exceptions to WDL Neuro Checks:      Last Documented NIHSS Modified Score:   Has TPA been given? Yes Temp: 97.8 F (36.6 C) (06/25 1216) Temp Source: Oral (06/25 1216) BP: 131/58 (06/25 1800) Pulse Rate: 81 (06/25 1800) If patient is a Neuro Trauma and patient is going to OR before floor call report to [place] nurse: [phone] or [phone]     R Recommendations: See Admitting Provider Note  Report given to:   Additional Notes:

## 2019-02-12 NOTE — ED Triage Notes (Signed)
Pt states that she cannot open her left eye since Friday. Pt states that when she forces it open, she has double vision.

## 2019-02-12 NOTE — ED Notes (Signed)
Patient transported to MRI 

## 2019-02-12 NOTE — H&P (Addendum)
TRH H&P    Patient Demographics:    Pamela Russell, is a 73 y.o. female  MRN: [id number]  DOB - 1946/05/16  Admit Date - 02/12/2019  Referring MD/NP/PA: Christ Kick  Outpatient Primary MD for the patient is [place] Bing, DO Nicholas Lose - oncology Dr. Lisbeth Renshaw - Radiation oncology   PLEASE [place] Josephina Shih Lissa Merlin [phone])  Patient coming from:  home  Chief complaint-  Difficulty opening left eye, double vision   HPI:    Pamela Russell  is a 73 y.o. female, w hypertension, hyperlipidemia, Copd, non-small cell lung cancer, s/p XRT x5  apparently had her left eye close on Thursday.  Pt tried to life her left eyelid and had diplopia.  Pt states that her left eye lid remained closed and therefore presented to ED  In ED,  T 97.8  P 81  R 18   Bp 131/58b  Pox 95% Wt 46.7 kg  Wbc 6.4, Hgb 14.6 Plt 282 K 3.2, Na 115 Bun 5, Creatinine 0.4 Glucose 90 INR 0.8  MRI brain IMPRESSION: 1. Intermittently motion degraded study despite premedication and repeated imaging attempts. 2. The dominant abnormality is mass-like enlargement of the right temporal lobe, although the affected area appears largely isointense to normal brain parenchyma. The right insula and perhaps also the pituitary gland may be affected. Main differential considerations at this point are an infiltrative tumor, and cortical dysplasia (although the latter would seem unlikely if there is no chronic seizure history). Recommend follow-up Brain MRI with sedation (or anesthesia), done without and with IV contrast. 3. 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 No brainstem ischemia or abnormality identified. 4. 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  Pt will be admitted for L eyelid ptosis.       Review of systems:    In addition to the HPI above,  No Fever-chills, No  Headache, No changes with  hearing, No problems swallowing food or Liquids, No Chest pain, Cough or Shortness of Breath, No Abdominal pain, No Nausea or Vomiting, bowel movements are regular, No Blood in stool or Urine, No dysuria, No new skin rashes or bruises, No new joints pains-aches,  No new weakness, tingling, numbness in any extremity, No recent weight gain or loss, No polyuria, polydypsia or polyphagia, No significant Mental Stressors.  All other systems reviewed and are negative.    Past History of the following :    Past Medical History:  Diagnosis Date   Allergic rhinitis    Asthma 2008   Chronic obstructive pulmonary disease (COPD) ([place]) 06/08/2016   Meds: Spiriva, albuterol inhaler PFT (11/29/2016): Moderate-severe obstruction by PFT 11/2016   Ear itching 07/05/2017   Grieving 06/08/2016   History of pneumonia    x4   Hyperlipidemia 09/10/2016   On atorvastatin 40 mg QD, ASA 81 mg QD ASCVD risk 17.1%, mod-high intensity statin Risk factors: FHx sig for MI: mother 53s-50s, and father age 73, HTN, smoker   Hypertension    Night sweats 06/08/2016  NSCL Ca dx'd 05/2018   xrt comp 09/2018   Pulmonary nodules 06/12/2017   Tobacco use disorder 07/06/2016   On nicotine replacement patch. Smoking cigs since age 19. Has 60 pack year history. Has COPD dx on SABA and LAMA.      Past Surgical History:  Procedure Laterality Date   CATARACT EXTRACTION Left 05/08/2016   CATARACT EXTRACTION Right 05/22/2016   TOTAL ABDOMINAL HYSTERECTOMY W/ BILATERAL SALPINGOOPHORECTOMY Bilateral 1975   VIDEO BRONCHOSCOPY WITH ENDOBRONCHIAL NAVIGATION Left 07/09/2018   Procedure: VIDEO BRONCHOSCOPY WITH ENDOBRONCHIAL NAVIGATION;  Surgeon: Collene Gobble, MD;  Location: [facility] OR;  Service: Thoracic;  Laterality: Left;      Social History:      Social History   Tobacco Use   Smoking status: Former Smoker    Packs/day: 0.50    Years: 60.00    Pack years: 30.00     Types: Cigarettes    Start date: 08/21/1955    Quit date: 01/28/2019    Years since quitting: 0.0   Smokeless tobacco: Never Used   Tobacco comment: 3 ppd for 20+ years, has cut down to 0.5 ppd, in the past 3 months, no cigs in 1 month  Substance Use Topics   Alcohol use: Yes    Alcohol/week: 1.0 standard drinks    Types: 1 Cans of beer per week    Comment: occ       Family History :     Family History  Problem Relation Age of Onset   Heart disease Mother        MI in her 41's   Stroke Mother    Heart disease Father        CABG   Hypertension Father    Heart disease Sister 32       CABG   Stroke Sister    Diabetes Sister        Home Medications:   Prior to Admission medications   Medication Sig Start Date End Date Taking? Authorizing Provider  albuterol (PROAIR HFA) 108 (90 Base) MCG/ACT inhaler Inhale 2 puffs into the lungs every 6 (six) hours as needed for wheezing or shortness of breath. 12/17/18  Yes Mannam, Praveen, MD  albuterol (PROVENTIL) (2.5 MG/3ML) 0.083% nebulizer solution Take 2.5 mg by nebulization daily as needed for wheezing or shortness of breath.   Yes [provider]  aspirin EC 81 MG tablet Take 81 mg by mouth daily.   Yes [provider]  cetirizine (ZYRTEC) 10 MG tablet Take 10 mg by mouth daily.   Yes [provider]  fluticasone (FLONASE) 50 MCG/ACT nasal spray Place 2 sprays into both nostrils daily. Patient taking differently: Place 2 sprays into both nostrils 2 (two) times a day.  02/11/19  Yes Patriciaann Clan, DO  glucosamine-chondroitin 500-400 MG tablet Take 1 tablet by mouth daily.    Yes [provider]  Glycopyrrolate-Formoterol (BEVESPI AEROSPHERE) 9-4.8 MCG/ACT AERO Inhale 2 puffs into the lungs 2 (two) times daily. 12/17/18  Yes Fenton Foy, NP  mometasone (ELOCON) 0.1 % lotion Apply 1 application topically daily. 01/08/19  Yes Hanalei Bing, DO  Multiple Vitamin (MULTIVITAMIN WITH  MINERALS) TABS tablet Take 1 tablet by mouth daily.   Yes [provider]  amLODipine (NORVASC) 5 MG tablet TAKE 1 TABLET BY MOUTH  DAILY Patient not taking: No sig reported 08/27/18   [place] Bing, DO  hydrochlorothiazide (HYDRODIURIL) 25 MG tablet TAKE 1 TABLET BY MOUTH  DAILY Patient not  taking: No sig reported 08/27/18   [place] Bing, DO  predniSONE (DELTASONE) 10 MG tablet Take 30mg  daily x 5 days; 20mg  daily x 5 days; 10mg  daily x 5 days; 5mg  daily x 5 days Patient not taking: Reported on 02/12/2019 01/21/19   Martyn Ehrich, NP     Allergies:     Allergies  Allergen Reactions   Codeine Anaphylaxis and Swelling   Penicillins Anaphylaxis and Swelling    Childhood reaction. Has patient had a PCN reaction causing immediate rash, facial/tongue/throat swelling, SOB or lightheadedness with hypotension: Yes Has patient had a PCN reaction causing severe rash involving mucus membranes or skin necrosis: No Has patient had a PCN reaction that required hospitalization: No Has patient had a PCN reaction occurring within the last 10 years: No If all of the above answers are "NO", then may proceed with Cephalosporin use.    Anoro Ellipta [Umeclidinium-Vilanterol] Nausea And Vomiting    Severe      Physical Exam:   Vitals  Blood pressure (!) 131/58, pulse 81, temperature 97.8 F (36.6 C), temperature source Oral, resp. rate 18, height 5\' 4"  (1.626 m), weight 46.7 kg, SpO2 95 %.  1.  General: axox3  2. Psychiatric: euthymic  3. Neurologic: Pt has L eye ptosis  4. HEENMT:  Anicteric, L eye ptosis, pupils 1.60mm symmetric, direct, consensual, intact Mmm Neck: no jvd  5. Respiratory : Slight faint exp wheezing, no crackles  6. Cardiovascular : rrr s1, s2,   7. Gastrointestinal:  Abd: soft, nt, nd, +bs  8. Skin:  Ext: no c/c/e,  No rash  9.Musculoskeletal:  Good ROM    No cervical adenopathy    Data Review:    CBC Recent Labs  Lab  02/12/19 1351 02/12/19 1405  WBC 6.4  --   HGB 13.9 14.6  HCT 38.7 43.0  PLT 282  --   MCV 89.4  --   MCH 32.1  --   MCHC 35.9  --   RDW 13.2  --   LYMPHSABS 1.3  --   MONOABS 0.7  --   EOSABS 0.1  --   BASOSABS 0.1  --    ------------------------------------------------------------------------------------------------------------------  Results for orders placed or performed during the hospital encounter of 02/12/19 (from the past 48 hour(s))  Ethanol     Status: None   Collection Time: 02/12/19  1:51 PM  Result Value Ref Range   Alcohol, Ethyl (B) <10 <10 mg/dL    Comment: (NOTE) Lowest detectable limit for serum alcohol is 10 mg/dL. For medical purposes only. Performed at [facility], [place] [address], [place], [place] [address]   Protime-INR     Status: None   Collection Time: 02/12/19  1:51 PM  Result Value Ref Range   Prothrombin Time 11.5 11.4 - 15.2 seconds   INR 0.8 0.8 - 1.2    Comment: (NOTE) INR goal varies based on device and disease states. Performed at [facility], [place] [address] [place], [place] [address]   APTT     Status: None   Collection Time: 02/12/19  1:51 PM  Result Value Ref Range   aPTT 31 24 - 36 seconds    Comment: Performed at [facility], [place] [address], [place], [place] [address]  CBC     Status: None   Collection Time: 02/12/19  1:51 PM  Result Value Ref Range   WBC 6.4 4.0 - 10.5 K/uL   RBC 4.33 3.87 -  5.11 MIL/uL   Hemoglobin 13.9 12.0 - 15.0 g/dL   HCT 38.7 36.0 - 46.0 %   MCV 89.4 80.0 - 100.0 fL   MCH 32.1 26.0 - 34.0 pg   MCHC 35.9 30.0 - 36.0 g/dL   RDW 13.2 11.5 - 15.5 %   Platelets 282 150 - 400 K/uL   nRBC 0.0 0.0 - 0.2 %    Comment: Performed at [facility], [place] [address], [place], [place] [address]  Differential     Status: None   Collection Time: 02/12/19  1:51 PM  Result Value Ref Range   Neutrophils Relative % 65 %   Neutro Abs 4.2  1.7 - 7.7 K/uL   Lymphocytes Relative 21 %   Lymphs Abs 1.3 0.7 - 4.0 K/uL   Monocytes Relative 10 %   Monocytes Absolute 0.7 0.1 - 1.0 K/uL   Eosinophils Relative 2 %   Eosinophils Absolute 0.1 0.0 - 0.5 K/uL   Basophils Relative 1 %   Basophils Absolute 0.1 0.0 - 0.1 K/uL   Immature Granulocytes 1 %   Abs Immature Granulocytes 0.04 0.00 - 0.07 K/uL    Comment: Performed at [facility], [place] [address] [place], [place] [address]  Comprehensive metabolic panel     Status: Abnormal   Collection Time: 02/12/19  1:51 PM  Result Value Ref Range   Sodium 118 (LL) 135 - 145 mmol/L    Comment: CRITICAL RESULT CALLED TO, READ BACK BY AND VERIFIED WITH: C.FRANKLIN AT 1532 ON 02/12/19 BY N.THOMPSON    Potassium 3.0 (L) 3.5 - 5.1 mmol/L   Chloride 81 (L) 98 - 111 mmol/L   CO2 27 22 - 32 mmol/L   Glucose, Bld 90 70 - 99 mg/dL   BUN 7 (L) 8 - 23 mg/dL   Creatinine, Ser 0.31 (L) 0.44 - 1.00 mg/dL   Calcium 8.7 (L) 8.9 - 10.3 mg/dL   Total Protein 7.0 6.5 - 8.1 g/dL   Albumin 3.8 3.5 - 5.0 g/dL   AST 58 (H) 15 - 41 U/L   ALT 11 0 - 44 U/L   Alkaline Phosphatase 71 38 - 126 U/L   Total Bilirubin 0.7 0.3 - 1.2 mg/dL   GFR calc non Af Amer >60 >60 mL/min   GFR calc Af Amer >60 >60 mL/min   Anion gap 10 5 - 15    Comment: Performed at [facility], [place] [address], [place], [place] [address]  I-stat chem 8, ED     Status: Abnormal   Collection Time: 02/12/19  2:05 PM  Result Value Ref Range   Sodium 115 (LL) 135 - 145 mmol/L   Potassium 3.2 (L) 3.5 - 5.1 mmol/L   Chloride 77 (L) 98 - 111 mmol/L   BUN 5 (L) 8 - 23 mg/dL   Creatinine, Ser 0.40 (L) 0.44 - 1.00 mg/dL   Glucose, Bld 90 70 - 99 mg/dL   Calcium, Ion 1.07 (L) 1.15 - 1.40 mmol/L   TCO2 28 22 - 32 mmol/L   Hemoglobin 14.6 12.0 - 15.0 g/dL   HCT 43.0 36.0 - 46.0 %   Comment NOTIFIED PHYSICIAN     Chemistries  Recent Labs  Lab 02/06/19 1450 02/12/19 1351 02/12/19 1405  NA 126* 118* 115*   K 3.9 3.0* 3.2*  CL 88* 81* 77*  CO2 25 27  --   GLUCOSE 113* 90 90  BUN 6 7* 5*  CREATININE 0.45 0.31* 0.40*  CALCIUM 9.6 8.7*  --   MG 1.4*  --   --   AST  --  58*  --   ALT  --  11  --   ALKPHOS  --  71  --   BILITOT  --  0.7  --    ------------------------------------------------------------------------------------------------------------------  ------------------------------------------------------------------------------------------------------------------ GFR: Estimated Creatinine Clearance: 46.9 mL/min (A) (by C-G formula based on SCr of 0.4 mg/dL (L)). Liver Function Tests: Recent Labs  Lab 02/12/19 1351  AST 58*  ALT 11  ALKPHOS 71  BILITOT 0.7  PROT 7.0  ALBUMIN 3.8   No results for input(s): LIPASE, AMYLASE in the last 168 hours. No results for input(s): AMMONIA in the last 168 hours. Coagulation Profile: Recent Labs  Lab 02/12/19 1351  INR 0.8   Cardiac Enzymes: No results for input(s): CKTOTAL, CKMB, CKMBINDEX, TROPONINI in the last 168 hours. BNP (last 3 results) No results for input(s): PROBNP in the last 8760 hours. HbA1C: No results for input(s): HGBA1C in the last 72 hours. CBG: No results for input(s): GLUCAP in the last 168 hours. Lipid Profile: No results for input(s): CHOL, HDL, LDLCALC, TRIG, CHOLHDL, LDLDIRECT in the last 72 hours. Thyroid Function Tests: No results for input(s): TSH, T4TOTAL, FREET4, T3FREE, THYROIDAB in the last 72 hours. Anemia Panel: No results for input(s): VITAMINB12, FOLATE, FERRITIN, TIBC, IRON, RETICCTPCT in the last 72 hours.  --------------------------------------------------------------------------------------------------------------- Urine analysis:    Component Value Date/Time   COLORURINE YELLOW 01/21/2019 1853   APPEARANCEUR CLEAR 01/21/2019 1853   LABSPEC 1.006 01/21/2019 1853   PHURINE 6.0 01/21/2019 1853   GLUCOSEU NEGATIVE 01/21/2019 1853   HGBUR NEGATIVE 01/21/2019 1853   BILIRUBINUR  NEGATIVE 01/21/2019 1853   KETONESUR 20 (A) 01/21/2019 1853   PROTEINUR NEGATIVE 01/21/2019 1853   NITRITE NEGATIVE 01/21/2019 1853   LEUKOCYTESUR NEGATIVE 01/21/2019 1853      Imaging Results:    Mr Brain Wo Contrast  Result Date: 02/12/2019 CLINICAL DATA:  73 year old female with left 3rd nerve palsy for 6 days. History of lung cancer treated earlier this year. EXAM: MRI HEAD WITHOUT CONTRAST TECHNIQUE: Multiplanar, multiecho pulse sequences of the brain and surrounding structures were obtained without intravenous contrast. COMPARISON:  None. FINDINGS: Study is intermittently degraded by motion artifact despite repeated imaging attempts. Brain: There is a small focus of restricted diffusion in the inferior left occipital lobe white matter best seen on series 11, image 11. However, no brainstem diffusion abnormality is identified. Faint if any associated T2 and FLAIR hyperintensity in the left occipital lobe white matter. No associated hemorrhage or mass effect. However, the right temporal lobe is abnormal, with mass-like soft tissue thickening and architectural distortion most apparent on series 12, image 15 and series 5, image 10. There is associated mass effect on the right temporal horn, although no ventriculomegaly. However, the abnormal parenchyma appears largely isointense to other gray and white matter signal. It is unclear whether the some of the right insula might also be affected and abnormally thickened (series 10, image 50). No midline shift. Basilar cisterns remain normal. The deep gray matter nuclei brainstem and cerebellum appear normal. No cortical encephalomalacia or chronic cerebral blood products. Cervicomedullary junction within normal limits. Vascular: Partial loss of the distal left vertebral artery flow void (series 5, image 2). Other Major intracranial vascular flow voids are preserved. Skull and upper cervical spine: Negative visible cervical spine. Visualized bone marrow  signal is within normal limits. Sinuses/Orbits: The pituitary gland also appears at the upper limits of  normal, but the suprasellar cistern is preserved. Normal noncontrast appearance of the cavernous sinuses. Postoperative changes to both globes, the intraorbital soft tissues appear normal. Paranasal sinuses are clear. Other: Mastoid air cells are clear. Visible internal auditory structures appear normal. Scalp and face soft tissues appear negative. IMPRESSION: 1. Intermittently motion degraded study despite premedication and repeated imaging attempts. 2. The dominant abnormality is mass-like enlargement of the right temporal lobe, although the affected area appears largely isointense to normal brain parenchyma. The right insula and perhaps also the pituitary gland may be affected. Main differential considerations at this point are an infiltrative tumor, and cortical dysplasia (although the latter would seem unlikely if there is no chronic seizure history). Recommend follow-up Brain MRI with sedation (or anesthesia), done without and with IV contrast. 3. 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 No brainstem ischemia or abnormality identified. 4. 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 Electronically Signed   By: Genevie Ann M.D.   On: 02/12/2019 15:13       Assessment & Plan:    Active Problems:   Malignant neoplasm of right upper lobe of lung (HCC)   Hyponatremia   Hypokalemia   Ptosis  Ptosis L eye, Diplopia Check MRI brain  Decadron 10mg  iv x1 in ED, 4mg  iv q6h Note patient was scheduled for lymph node biopsy on 6/30 Please try to Coordinate with IR for biopsy. I have requested IR consult in the computer Please consult oncology in AM May need radiation oncology input (Dr. 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  Please consult oncology in AM  Copd Cont Bevespi Aerosphere Cont Albuterol   Hypertension Diet controlled, pt states not taking amlodipine or hydrochlorothiazide  Mild R hydroureteronephrosis Please monitor, may need outpatient urology consultation  [place] WITH UPDATES (Sissy Lissa Merlin [phone])    DVT Prophylaxis-   Lovenox - SCDs  AM Labs Ordered, also please review Full Orders  Family Communication: Admission, patients condition and plan of care including tests being ordered have been discussed with the patient who indicate understanding and agree with the plan and Code Status.  Code Status:  DNR, spoke with her daughter and let her know that pt would   Admission status:  Inpatient: Based on patients clinical presentation and evaluation of above clinical data, I have made determination that patient meets Inpatient criteria at this time.  Pt has high risk of clinical deterioration , sodium is critically low.  Pt will need gentle hydration. And close follow up.     Time spent in minutes : 70   Jani Gravel M.D on 02/12/2019 at 7:17 PM

## 2019-02-12 NOTE — ED Provider Notes (Signed)
[facility] DEPT Provider Note   CSN: [id number] Arrival date & time: 02/12/19  1202    History   Chief Complaint Chief Complaint  Patient presents with   Eye Problem    HPI Pamela Russell is a 73 y.o. female.     HPI   She presents for evaluation of double vision, and difficulty opening her left eye which started yesterday.  She denies headache.  She states she is unable to open her left eye voluntarily.  When she lifts her eyelid up she has "double vision."  She states "my vision is not blurry."  No prior similar problems.  She denies headache, fever, chills, cough, shortness of breath, weakness or dizziness.  Recently she was seen and treated for minor electrolyte abnormalities.  There are no other known modifying factors.  Past Medical History:  Diagnosis Date   Allergic rhinitis    Asthma 2008   Chronic obstructive pulmonary disease (COPD) ([place]) 06/08/2016   Meds: Spiriva, albuterol inhaler PFT (11/29/2016): Moderate-severe obstruction by PFT 11/2016   Ear itching 07/05/2017   Grieving 06/08/2016   History of pneumonia    x4   Hyperlipidemia 09/10/2016   On atorvastatin 40 mg QD, ASA 81 mg QD ASCVD risk 17.1%, mod-high intensity statin Risk factors: FHx sig for MI: mother 9s-50s, and father age 50, HTN, smoker   Hypertension    Night sweats 06/08/2016   NSCL Ca dx'd 05/2018   xrt comp 09/2018   Pulmonary nodules 06/12/2017   Tobacco use disorder 07/06/2016   On nicotine replacement patch. Smoking cigs since age 14. Has 60 pack year history. Has COPD dx on SABA and LAMA.    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   Hypotension 01/21/2019   Weakness 01/21/2019   COPD with acute exacerbation ([place]) 10/21/2018   Primary malignant neoplasm of bronchus of left upper lobe  ([place]) 09/08/2018   Malignant neoplasm of right upper lobe of lung ([place]) 07/14/2018   Ear itching 07/05/2017   Pulmonary nodules 06/12/2017   Hyperlipidemia 09/10/2016   Tobacco use disorder 07/06/2016   Primary hypertension 06/08/2016   Chronic obstructive pulmonary disease (COPD) ([place]) 06/08/2016   Night sweats 06/08/2016    Past Surgical History:  Procedure Laterality Date   CATARACT EXTRACTION Left 05/08/2016   CATARACT EXTRACTION Right 05/22/2016   TOTAL ABDOMINAL HYSTERECTOMY W/ BILATERAL SALPINGOOPHORECTOMY Bilateral 1975   VIDEO BRONCHOSCOPY WITH ENDOBRONCHIAL NAVIGATION Left 07/09/2018   Procedure: VIDEO BRONCHOSCOPY WITH ENDOBRONCHIAL NAVIGATION;  Surgeon: Collene Gobble, MD;  Location: [facility] OR;  Service: Thoracic;  Laterality: Left;     OB History   No obstetric history on file.      Home Medications    Prior to Admission medications   Medication Sig Start Date End Date Taking? Authorizing Provider  albuterol (PROAIR HFA) 108 (90 Base) MCG/ACT inhaler Inhale 2 puffs into the lungs every 6 (six) hours as needed for wheezing or shortness of breath. 12/17/18  Yes Mannam, Praveen, MD  albuterol (PROVENTIL) (2.5 MG/3ML) 0.083% nebulizer solution Take 2.5 mg by nebulization daily as needed for wheezing or shortness of breath.   Yes [provider]  aspirin EC 81 MG tablet Take 81 mg by mouth daily.   Yes [provider]  cetirizine (ZYRTEC) 10 MG tablet Take 10 mg by mouth daily.   Yes [provider]  fluticasone (FLONASE) 50 MCG/ACT nasal spray Place 2 sprays into both nostrils daily. Patient taking differently: Place 2 sprays into both nostrils 2 (two) times a day.  02/11/19  Yes Patriciaann Clan, DO  glucosamine-chondroitin 500-400 MG tablet Take 1 tablet by mouth daily.    Yes [provider]  Glycopyrrolate-Formoterol (BEVESPI AEROSPHERE) 9-4.8 MCG/ACT AERO Inhale 2 puffs into the lungs 2 (two) times daily. 12/17/18  Yes  Fenton Foy, NP  mometasone (ELOCON) 0.1 % lotion Apply 1 application topically daily. 01/08/19  Yes Hoyt Bing, DO  Multiple Vitamin (MULTIVITAMIN WITH MINERALS) TABS tablet Take 1 tablet by mouth daily.   Yes [provider]  amLODipine (NORVASC) 5 MG tablet TAKE 1 TABLET BY MOUTH  DAILY Patient not taking: No sig reported 08/27/18   Gambell Bing, DO  hydrochlorothiazide (HYDRODIURIL) 25 MG tablet TAKE 1 TABLET BY MOUTH  DAILY Patient not taking: No sig reported 08/27/18   [place] Bing, DO  predniSONE (DELTASONE) 10 MG tablet Take 30mg  daily x 5 days; 20mg  daily x 5 days; 10mg  daily x 5 days; 5mg  daily x 5 days Patient not taking: Reported on 02/12/2019 01/21/19   Martyn Ehrich, NP    Family History Family History  Problem Relation Age of Onset   Heart disease Mother        MI in her 56's   Stroke Mother    Heart disease Father        CABG   Hypertension Father    Heart disease Sister 41       CABG   Stroke Sister    Diabetes Sister     Social History Social History   Tobacco Use   Smoking status: Former Smoker    Packs/day: 0.50    Years: 60.00    Pack years: 30.00    Types: Cigarettes    Start date: 08/21/1955    Quit date: 01/28/2019    Years since quitting: 0.0   Smokeless tobacco: Never Used   Tobacco comment: 3 ppd for 20+ years, has cut down to 0.5 ppd, in the past 3 months, no cigs in 1 month  Substance Use Topics   Alcohol use: Yes    Alcohol/week: 1.0 standard drinks    Types: 1 Cans of beer per week    Comment: occ   Drug use: No     Allergies   Codeine, Penicillins, and Anoro ellipta [umeclidinium-vilanterol]   Review of Systems Review of Systems  All other systems reviewed and are negative.    Physical Exam Updated Vital Signs BP (!) 146/62    Pulse 80    Temp 97.8 F (36.6 C) (Oral)    Resp 18    Ht 5\' 4"  (1.626 m)    Wt 46.7 kg    SpO2 97%    BMI 17.68 kg/m   Physical Exam Vitals signs and nursing  note reviewed.  Constitutional:      General: She is not in acute distress.    Appearance: She is well-developed and normal weight. She is not ill-appearing, toxic-appearing or diaphoretic.  HENT:     Head: Normocephalic and atraumatic.     Right Ear: External ear normal.     Left Ear: External ear normal.  Eyes:     Conjunctiva/sclera: Conjunctivae normal.     Pupils: Pupils are equal, round, and reactive to light.  Neck:     Musculoskeletal: Normal range of motion and neck supple.  Trachea: Phonation normal.  Cardiovascular:     Rate and Rhythm: Normal rate and regular rhythm.     Heart sounds: Normal heart sounds.  Pulmonary:     Effort: Pulmonary effort is normal.     Breath sounds: Normal breath sounds.  Abdominal:     Palpations: Abdomen is soft.     Tenderness: There is no abdominal tenderness.  Musculoskeletal: Normal range of motion.  Skin:    General: Skin is warm and dry.  Neurological:     Mental Status: She is alert and oriented to person, place, and time.     Cranial Nerves: Cranial nerve deficit present.     Sensory: No sensory deficit.     Motor: No abnormal muscle tone.     Coordination: Coordination normal.     Comments: Left eye ophthalmoplegia, with inability to abduct, or move inferiomedial.  Unable to voluntarily open left eyelid.  Remainder facial nerves appear intact.  There is no nystagmus.  Psychiatric:        Mood and Affect: Mood normal.        Behavior: Behavior normal.        Thought Content: Thought content normal.        Judgment: Judgment normal.      ED Treatments / Results  Labs (all labs ordered are listed, but only abnormal results are displayed) Labs Reviewed  COMPREHENSIVE METABOLIC PANEL - Abnormal; Notable for the following components:      Result Value   Sodium 118 (*)    Potassium 3.0 (*)    Chloride 81 (*)    BUN 7 (*)    Creatinine, Ser 0.31 (*)    Calcium 8.7 (*)    AST 58 (*)    All other components within normal  limits  I-STAT CHEM 8, ED - Abnormal; Notable for the following components:   Sodium 115 (*)    Potassium 3.2 (*)    Chloride 77 (*)    BUN 5 (*)    Creatinine, Ser 0.40 (*)    Calcium, Ion 1.07 (*)    All other components within normal limits  NOVEL CORONAVIRUS, NAA (HOSPITAL ORDER, SEND-OUT TO REF LAB)  ETHANOL  PROTIME-INR  APTT  CBC  DIFFERENTIAL  RAPID URINE DRUG SCREEN, HOSP PERFORMED  URINALYSIS, ROUTINE W REFLEX MICROSCOPIC  MAGNESIUM    EKG None  Radiology Mr Brain Wo Contrast  Result Date: 02/12/2019 CLINICAL DATA:  73 year old female with left 3rd nerve palsy for 6 days. History of lung cancer treated earlier this year. EXAM: MRI HEAD WITHOUT CONTRAST TECHNIQUE: Multiplanar, multiecho pulse sequences of the brain and surrounding structures were obtained without intravenous contrast. COMPARISON:  None. FINDINGS: Study is intermittently degraded by motion artifact despite repeated imaging attempts. Brain: There is a small focus of restricted diffusion in the inferior left occipital lobe white matter best seen on series 11, image 11. However, no brainstem diffusion abnormality is identified. Faint if any associated T2 and FLAIR hyperintensity in the left occipital lobe white matter. No associated hemorrhage or mass effect. However, the right temporal lobe is abnormal, with mass-like soft tissue thickening and architectural distortion most apparent on series 12, image 15 and series 5, image 10. There is associated mass effect on the right temporal horn, although no ventriculomegaly. However, the abnormal parenchyma appears largely isointense to other gray and white matter signal. It is unclear whether the some of the right insula might also be affected and abnormally thickened (series 10, image 50).  No midline shift. Basilar cisterns remain normal. The deep gray matter nuclei brainstem and cerebellum appear normal. No cortical encephalomalacia or chronic cerebral blood products.  Cervicomedullary junction within normal limits. Vascular: Partial loss of the distal left vertebral artery flow void (series 5, image 2). Other Major intracranial vascular flow voids are preserved. Skull and upper cervical spine: Negative visible cervical spine. Visualized bone marrow signal is within normal limits. Sinuses/Orbits: The pituitary gland also appears at the upper limits of normal, but the suprasellar cistern is preserved. Normal noncontrast appearance of the cavernous sinuses. Postoperative changes to both globes, the intraorbital soft tissues appear normal. Paranasal sinuses are clear. Other: Mastoid air cells are clear. Visible internal auditory structures appear normal. Scalp and face soft tissues appear negative. IMPRESSION: 1. Intermittently motion degraded study despite premedication and repeated imaging attempts. 2. The dominant abnormality is mass-like enlargement of the right temporal lobe, although the affected area appears largely isointense to normal brain parenchyma. The right insula and perhaps also the pituitary gland may be affected. Main differential considerations at this point are an infiltrative tumor, and cortical dysplasia (although the latter would seem unlikely if there is no chronic seizure history). Recommend follow-up Brain MRI with sedation (or anesthesia), done without and with IV contrast. 3. 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 No brainstem ischemia or abnormality identified. 4. 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 Electronically Signed   By: Genevie Ann M.D.   On: 02/12/2019 15:13    Procedures .Critical Care Performed by: Daleen Bo, MD Authorized by: Daleen Bo, MD   Critical care provider statement:    Critical care time (minutes):  35   Critical care start time:  02/12/2019 12:40 PM   Critical care end time:  02/12/2019 5:29 PM   Critical care time was exclusive of:  Separately billable procedures and treating  other patients   Critical care was necessary to treat or prevent imminent or life-threatening deterioration of the following conditions:  Metabolic crisis and CNS failure or compromise   Critical care was time spent personally by me on the following activities:  Blood draw for specimens, development of treatment plan with patient or surrogate, discussions with consultants, evaluation of patient's response to treatment, examination of patient, obtaining history from patient or surrogate, ordering and performing treatments and interventions, ordering and review of laboratory studies, pulse oximetry, re-evaluation of patient's condition, review of old charts and ordering and review of radiographic studies   (including critical care time)  Medications Ordered in ED Medications  0.9 %  sodium chloride infusion ( Intravenous New Bag/Given 02/12/19 1354)  LORazepam (ATIVAN) injection 1 mg (1 mg Intravenous Given 02/12/19 1354)  dexamethasone (DECADRON) injection 10 mg (has no administration in time range)  magnesium sulfate IVPB 2 g 50 mL (has no administration in time range)  potassium chloride SA (K-DUR) CR tablet 40 mEq (has no administration in time range)     Initial Impression / Assessment and Plan / ED Course  I have reviewed the triage vital signs and the nursing notes.  Pertinent labs & imaging results that were available during my care of the patient were reviewed by me and considered in my medical decision making (see chart for details).  Clinical Course as of Feb 12 1728  Thu Feb 12, 2019  1721 Case discussed with oncology, Dr. Lindi Adie, he recommends hospitalization, steroids for possible edema, and repeat MRI under sedation.  He also recommends that radiation oncology be consulted for  possible treatment of a new brain tumor.   [EW]  1725 I-stat chem 8, ED(!!) [EW]  1725 Abnormal, sodium low, potassium low, chloride low, BUN low, creatinine low, calcium low  I-stat chem 8, ED(!!) [EW]   1725 Normal, sodium low, potassium low, chloride low, creatinine low, calcium low, AST high  Comprehensive metabolic panel(!!) [EW]  5784 Normal  Ethanol [EW]  1726 Normal  CBC [EW]    Clinical Course User Index [EW] Daleen Bo, MD        Patient Vitals for the past 24 hrs:  BP Temp Temp src Pulse Resp SpO2 Height Weight  02/12/19 1630 (!) 146/62 -- -- 80 18 97 % -- --  02/12/19 1530 (!) 122/56 -- -- 79 17 97 % -- --  02/12/19 1341 (!) 148/122 -- -- 73 12 100 % -- --  02/12/19 1300 (!) 141/68 -- -- 72 17 98 % -- --  02/12/19 1217 -- -- -- -- -- -- 5\' 4"  (1.626 m) 46.7 kg  02/12/19 1216 116/64 97.8 F (36.6 C) Oral 81 14 100 % -- --    5:24 PM Reevaluation with update and discussion. After initial assessment and treatment, an updated evaluation reveals no change in clinical status.Daleen Bo   Medical Decision Making: Patient with visual complaints including unable to open left eyelid, and has clinical exam consistent with left cranial nerve III palsy.  MRI indicates infiltrative tumor, right temporal lobe and likely to dental left occipital infarct, small.  Patient has known metastatic lung cancer.  She has chronic hyponatremia.  Now with hypokalemia.  Check magnesium level, treat with potassium and magnesium, and arrange for hospitalization.  Volume replacement with saline ordered.  CRITICAL CARE-yes Performed by: Daleen Bo  Nursing Notes Reviewed/ Care Coordinated Applicable Imaging Reviewed Interpretation of Laboratory Data incorporated into ED treatment   5:29 PM-Consult complete with Hospitalist. Patient case explained and discussed. He agrees to admit patient for further evaluation and treatment. Call ended at [place]: Admit  Final Clinical Impressions(s) / ED Diagnoses   Final diagnoses:  Left oculomotor nerve palsy  Metastatic cancer ([place])  Hypokalemia  Hyponatremia    ED Discharge Orders    None       Daleen Bo, MD 02/13/19  1211

## 2019-02-13 ENCOUNTER — Encounter (HOSPITAL_COMMUNITY): Payer: Self-pay | Admitting: Certified Registered"

## 2019-02-13 ENCOUNTER — Encounter (HOSPITAL_COMMUNITY)
Admission: EM | Disposition: A | Discharge status: Home / Self Care | Payer: Self-pay | Source: Home / Self Care | Attending: Internal Medicine

## 2019-02-13 ENCOUNTER — Inpatient Hospital Stay (HOSPITAL_COMMUNITY): Payer: Medicare Other | Admitting: Certified Registered"

## 2019-02-13 ENCOUNTER — Telehealth: Payer: Self-pay | Admitting: Radiation Therapy

## 2019-02-13 ENCOUNTER — Other Ambulatory Visit: Payer: Self-pay

## 2019-02-13 ENCOUNTER — Inpatient Hospital Stay (HOSPITAL_COMMUNITY): Payer: Medicare Other

## 2019-02-13 DIAGNOSIS — C772 Secondary and unspecified malignant neoplasm of intra-abdominal lymph nodes: Principal | ICD-10-CM

## 2019-02-13 DIAGNOSIS — H4902 Third [oculomotor] nerve palsy, left eye: Secondary | ICD-10-CM

## 2019-02-13 DIAGNOSIS — C799 Secondary malignant neoplasm of unspecified site: Secondary | ICD-10-CM

## 2019-02-13 DIAGNOSIS — G9389 Other specified disorders of brain: Secondary | ICD-10-CM

## 2019-02-13 DIAGNOSIS — H532 Diplopia: Secondary | ICD-10-CM

## 2019-02-13 HISTORY — PX: RADIOLOGY WITH ANESTHESIA: SHX6223

## 2019-02-13 LAB — CBC
HCT: 40.7 % (ref 36.0–46.0)
Hemoglobin: 14.3 g/dL (ref 12.0–15.0)
MCH: 32.1 pg (ref 26.0–34.0)
MCHC: 35.1 g/dL (ref 30.0–36.0)
MCV: 91.3 fL (ref 80.0–100.0)
Platelets: 284 10*3/uL (ref 150–400)
RBC: 4.46 MIL/uL (ref 3.87–5.11)
RDW: 13.3 % (ref 11.5–15.5)
WBC: 4.6 10*3/uL (ref 4.0–10.5)
nRBC: 0 % (ref 0.0–0.2)

## 2019-02-13 LAB — COMPREHENSIVE METABOLIC PANEL
ALT: 11 U/L (ref 0–44)
AST: 40 U/L (ref 15–41)
Albumin: 3.6 g/dL (ref 3.5–5.0)
Alkaline Phosphatase: 65 U/L (ref 38–126)
Anion gap: 17 — ABNORMAL HIGH (ref 5–15)
BUN: 5 mg/dL — ABNORMAL LOW (ref 8–23)
CO2: 19 mmol/L — ABNORMAL LOW (ref 22–32)
Calcium: 9.3 mg/dL (ref 8.9–10.3)
Chloride: 92 mmol/L — ABNORMAL LOW (ref 98–111)
Creatinine, Ser: 0.44 mg/dL (ref 0.44–1.00)
GFR calc Af Amer: >60 mL/min (ref >60)
GFR calc non Af Amer: >60 mL/min (ref >60)
Glucose, Bld: 142 mg/dL — ABNORMAL HIGH (ref 70–99)
Potassium: 4.5 mmol/L (ref 3.5–5.1)
Sodium: 128 mmol/L — ABNORMAL LOW (ref 135–145)
Total Bilirubin: 0.4 mg/dL (ref 0.3–1.2)
Total Protein: 6.8 g/dL (ref 6.5–8.1)

## 2019-02-13 LAB — BASIC METABOLIC PANEL
Anion gap: 15 (ref 5–15)
BUN: 5 mg/dL — ABNORMAL LOW (ref 8–23)
CO2: 21 mmol/L — ABNORMAL LOW (ref 22–32)
Calcium: 9.3 mg/dL (ref 8.9–10.3)
Chloride: 94 mmol/L — ABNORMAL LOW (ref 98–111)
Creatinine, Ser: 0.45 mg/dL (ref 0.44–1.00)
GFR calc Af Amer: >60 mL/min (ref >60)
GFR calc non Af Amer: >60 mL/min (ref >60)
Glucose, Bld: 166 mg/dL — ABNORMAL HIGH (ref 70–99)
Potassium: 4.1 mmol/L (ref 3.5–5.1)
Sodium: 130 mmol/L — ABNORMAL LOW (ref 135–145)

## 2019-02-13 LAB — OSMOLALITY, URINE: Osmolality, Ur: 158 mOsm/kg — ABNORMAL LOW (ref 300–900)

## 2019-02-13 LAB — NOVEL CORONAVIRUS, NAA (HOSP ORDER, SEND-OUT TO REF LAB; TAT 18-24 HRS): SARS-CoV-2, NAA: NOT DETECTED

## 2019-02-13 LAB — OSMOLALITY: Osmolality: 243 mOsm/kg — CL (ref 275–295)

## 2019-02-13 LAB — MAGNESIUM: Magnesium: 2.2 mg/dL (ref 1.7–2.4)

## 2019-02-13 SURGERY — MRI WITH ANESTHESIA
Anesthesia: General

## 2019-02-13 MED ORDER — SODIUM CHLORIDE 0.9 % IV SOLN
INTRAVENOUS | Status: DC | PRN
  Administered 2019-02-13: 40 ug/min via INTRAVENOUS

## 2019-02-13 MED ORDER — SUCCINYLCHOLINE CHLORIDE 200 MG/10ML IV SOSY
PREFILLED_SYRINGE | INTRAVENOUS | Status: DC | PRN
  Administered 2019-02-13: 100 mg via INTRAVENOUS

## 2019-02-13 MED ORDER — MIDAZOLAM HCL 5 MG/5ML IJ SOLN
INTRAMUSCULAR | Status: DC | PRN
  Administered 2019-02-13: 1 mg via INTRAVENOUS

## 2019-02-13 MED ORDER — PHENYLEPHRINE 40 MCG/ML (10ML) SYRINGE FOR IV PUSH (FOR BLOOD PRESSURE SUPPORT)
PREFILLED_SYRINGE | INTRAVENOUS | Status: DC | PRN
  Administered 2019-02-13: 120 ug via INTRAVENOUS

## 2019-02-13 MED ORDER — PROPOFOL 10 MG/ML IV BOLUS
INTRAVENOUS | Status: DC | PRN
  Administered 2019-02-13: 100 mg via INTRAVENOUS

## 2019-02-13 MED ORDER — FENTANYL CITRATE (PF) 100 MCG/2ML IJ SOLN
INTRAMUSCULAR | Status: DC | PRN
  Administered 2019-02-13: 50 ug via INTRAVENOUS

## 2019-02-13 MED ORDER — GADOBUTROL 1 MMOL/ML IV SOLN
5.0000 mL | Freq: Once | INTRAVENOUS | Status: AC | PRN
  Administered 2019-02-13: 5 mL via INTRAVENOUS

## 2019-02-13 MED ORDER — LACTATED RINGERS IV SOLN
INTRAVENOUS | Status: DC
  Administered 2019-02-13 – 2019-02-14 (×4): via INTRAVENOUS

## 2019-02-13 MED ORDER — ONDANSETRON HCL 4 MG/2ML IJ SOLN
INTRAMUSCULAR | Status: DC | PRN
  Administered 2019-02-13: 4 mg via INTRAVENOUS

## 2019-02-13 MED ORDER — LACTATED RINGERS IV SOLN
INTRAVENOUS | Status: DC | PRN
  Administered 2019-02-13: 12:00:00 via INTRAVENOUS

## 2019-02-13 MED ORDER — LIDOCAINE 2% (20 MG/ML) 5 ML SYRINGE
INTRAMUSCULAR | Status: DC | PRN
  Administered 2019-02-13: 100 mg via INTRAVENOUS

## 2019-02-13 MED ORDER — ALBUTEROL SULFATE HFA 108 (90 BASE) MCG/ACT IN AERS
2.0000 | INHALATION_SPRAY | RESPIRATORY_TRACT | Status: AC
  Administered 2019-02-13: 2 via RESPIRATORY_TRACT
  Filled 2019-02-13: qty 6.7

## 2019-02-13 MED ORDER — ALBUTEROL SULFATE HFA 108 (90 BASE) MCG/ACT IN AERS
INHALATION_SPRAY | RESPIRATORY_TRACT | Status: DC | PRN
  Administered 2019-02-13: 4 via RESPIRATORY_TRACT

## 2019-02-13 MED ORDER — DEXAMETHASONE SODIUM PHOSPHATE 10 MG/ML IJ SOLN
INTRAMUSCULAR | Status: DC | PRN
  Administered 2019-02-13: 8 mg via INTRAVENOUS

## 2019-02-13 NOTE — Consult Note (Addendum)
[place]  [phone] [phone]   MEDICAL ONCOLOGY - INITIAL CONSULTATION  Referral MD: Dr. Oretha Milch  Reason for Referral: Brain mass, mediastinal, retroperitoneal, and left pelvis adenopathy, history of non-small cell lung cancer (Stage IA2, cT1bN0M0 NSCLC, Adenocarcinoma of the RUL and Putative Stage IA2, T1bN0M0 NSCLC of the LUL)  HPI: Ms. Guagliardo is a 73 year old female with a past medical history significant for hypertension, hyperlipidemia, COPD, non-small cell lung cancer, Stage IA s/p SBRT in January 2020.    She was followed closely by radiation oncology and had a CT of the chest performed on 01/16/2019 which showed the prior left suprahilar nodule was no longer visualized and that the additional right lung nodules were unchanged.  However, there was extensive lymphadenopathy in the posterior mediastinum and centered in the upper abdomen, incompletely visualized.  CT scan findings were concerning for lymphoma or nodal metastases from an identified abdominal malignancy rather than nodal metastases related to the patient's known primary bronchogenic neoplasm.  CT of the abdomen pelvis with performed on 01/28/2019 to better characterize her adenopathy and this showed extensive confluent adenopathy throughout the posterior lower mediastinum, retroperitoneum, and left pelvis which were all new since the PET scan performed in November 2019.  This appearance is compatible with metastatic disease.  The heterogeneous enhancement within these nodes is more typical of metastatic carcinoma versus lymphoma.  He also had new left adrenal metastasis.  Her case was reviewed in the GI tumor conference and recommendation was made to proceed with CT-guided biopsy.  This had not yet been scheduled when the patient presented to the emergency room with difficulty opening her left eye and double vision.  The patient had the symptoms for approximately 1 week.  Work-up in the  emergency room was significant for a potassium level of 3.2 and sodium 115.  An MRI of the brain was performed which showed a dominant abnormality that is with masslike enlargement of the right temporal lobe, although the affected area appears largely isointense to normal brain parenchyma.  The right insula and also the pituitary gland may be affected.  The MRI was intermittently motion degraded despite premedication and repeated imaging attempts.  Recommend follow-up MRI of the brain with sedation or anesthesia to be performed with and without IV contrast.  When seen today, the patient reports that she has had left eyelidptosis for about 1 week.  She does not have double vision in her right eye but reports of vision in her left eye.  She reports anorexia but denies having any weight loss or night sweats.  Denies dizziness and headaches.  Denies chest pain, shortness of breath, cough, hemoptysis.  Denies abdominal pain, nausea, vomiting.  Reports constipation.  No diarrhea.  Denies bleeding.  She has a 60-pack-year history of smoking cigarettes.  Quit earlier this year.  Medical oncology was asked see the patient made recommendations regarding her adenopathy and brain mass.   Past Medical History:  Diagnosis Date  . Allergic rhinitis   . Asthma 2008  . Chronic obstructive pulmonary disease (COPD) ([facility]) 06/08/2016   Meds: Spiriva, albuterol inhaler PFT (11/29/2016): Moderate-severe obstruction by PFT 11/2016  . Ear itching 07/05/2017  . Grieving 06/08/2016  . History of pneumonia    x4  . Hyperlipidemia 09/10/2016   On atorvastatin 40 mg QD, ASA 81 mg QD ASCVD risk 17.1%, mod-high intensity statin Risk factors: FHx sig for MI: mother 68s-50s, and father age 27, HTN, smoker  . Hypertension   .  Night sweats 06/08/2016  . NSCL Ca dx'd 05/2018   xrt comp 09/2018  . Pulmonary nodules 06/12/2017  . Tobacco use disorder 07/06/2016   On nicotine replacement patch. Smoking cigs since age 89. Has 60 pack  year history. Has COPD dx on SABA and LAMA.  :  Past Surgical History:  Procedure Laterality Date  . CATARACT EXTRACTION Left 05/08/2016  . CATARACT EXTRACTION Right 05/22/2016  . TOTAL ABDOMINAL HYSTERECTOMY W/ BILATERAL SALPINGOOPHORECTOMY Bilateral 1975  . VIDEO BRONCHOSCOPY WITH ENDOBRONCHIAL NAVIGATION Left 07/09/2018   Procedure: VIDEO BRONCHOSCOPY WITH ENDOBRONCHIAL NAVIGATION;  Surgeon: Collene Gobble, MD;  Location: [facility] OR;  Service: Thoracic;  Laterality: Left;  :  Current Facility-Administered Medications  Medication Dose Route Frequency Provider Last Rate Last Dose  . acetaminophen (TYLENOL) tablet 650 mg  650 mg Oral Q6H PRN Jani Gravel, MD       Or  . acetaminophen (TYLENOL) suppository 650 mg  650 mg Rectal Q6H PRN Jani Gravel, MD      . albuterol (VENTOLIN HFA) 108 (90 Base) MCG/ACT inhaler 2 puff  2 puff Inhalation Q6H PRN Jani Gravel, MD      . dexamethasone (DECADRON) injection 4 mg  4 mg Intravenous Q6H Jani Gravel, MD   4 mg at 02/13/19 [phone]  . enoxaparin (LOVENOX) injection 40 mg  40 mg Subcutaneous Q24H Jani Gravel, MD   40 mg at 02/12/19 2324  . fluticasone (FLONASE) 50 MCG/ACT nasal spray 2 spray  2 spray Each Nare Daily Jani Gravel, MD      . Glycopyrrolate-Formoterol 9-4.8 MCG/ACT AERO 2 puff  2 puff Inhalation BID Jani Gravel, MD      . loratadine (CLARITIN) tablet 10 mg  10 mg Oral Daily Jani Gravel, MD   10 mg at 02/13/19 0916  . LORazepam (ATIVAN) injection 1 mg  1 mg Intravenous Q30 min PRN Daleen Bo, MD   1 mg at 02/12/19 1354     Allergies  Allergen Reactions  . Codeine Anaphylaxis and Swelling  . Penicillins Anaphylaxis and Swelling    Childhood reaction. Has patient had a PCN reaction causing immediate rash, facial/tongue/throat swelling, SOB or lightheadedness with hypotension: Yes Has patient had a PCN reaction causing severe rash involving mucus membranes or skin necrosis: No Has patient had a PCN reaction that required hospitalization: No Has  patient had a PCN reaction occurring within the last 10 years: No If all of the above answers are "NO", then may proceed with Cephalosporin use.   Celedonio Miyamoto [Umeclidinium-Vilanterol] Nausea And Vomiting    Severe   :  Family History  Problem Relation Age of Onset  . Heart disease Mother        MI in her 38's  . Stroke Mother   . Heart disease Father        CABG  . Hypertension Father   . Heart disease Sister 61       CABG  . Stroke Sister   . Diabetes Sister   :  Social History   Socioeconomic History  . Marital status: Widowed    Spouse name: Not on file  . Number of children: 2  . Years of education: Not on file  . Highest education level: Not on file  Occupational History  . Not on file  Social Needs  . Financial resource strain: Not on file  . Food insecurity    Worry: Not on file    Inability: Not on file  . Transportation needs  Medical: Not on file    Non-medical: Not on file  Tobacco Use  . Smoking status: Former Smoker    Packs/day: 1.00    Years: 60.00    Pack years: 60.00    Types: Cigarettes    Start date: 08/21/1955    Quit date: 01/28/2019    Years since quitting: 0.0  . Smokeless tobacco: Never Used  . Tobacco comment: 3 ppd for 20+ years, has cut down to 0.5 ppd, in the past 3 months, no cigs in 1 month  Substance and Sexual Activity  . Alcohol use: Yes    Alcohol/week: 1.0 standard drinks    Types: 1 Cans of beer per week    Comment: occ  . Drug use: No  . Sexual activity: Not on file  Lifestyle  . Physical activity    Days per week: Not on file    Minutes per session: Not on file  . Stress: Not on file  Relationships  . Social Herbalist on phone: Not on file    Gets together: Not on file    Attends religious service: Not on file    Active member of club or organization: Not on file    Attends meetings of clubs or organizations: Not on file    Relationship status: Not on file  . Intimate partner violence    Fear  of current or ex partner: Not on file    Emotionally abused: Not on file    Physically abused: Not on file    Forced sexual activity: Not on file  Other Topics Concern  . Not on file  Social History Narrative  . Not on file  : Review of Systems: A comprehensive 14 point review of systems was negative except as noted in the HPI.  Exam: Patient Vitals for the past 24 hrs:  BP Temp Temp src Pulse Resp SpO2 Height Weight  02/13/19 0331 (!) 142/66 97.7 F (36.5 C) Oral (!) 103 18 93 % - -  02/12/19 2109 130/69 98.1 F (36.7 C) Oral 89 - 95 % 5\' 4"  (1.626 m) 105 lb 6.1 oz (47.8 kg)  02/12/19 1800 (!) 131/58 - - 81 18 95 % - -  02/12/19 1730 110/79 - - 82 18 94 % - -  02/12/19 1700 (!) 151/58 - - 80 17 95 % - -  02/12/19 1630 (!) 146/62 - - 80 18 97 % - -  02/12/19 1530 (!) 122/56 - - 79 17 97 % - -  02/12/19 1341 (!) 148/122 - - 73 12 100 % - -  02/12/19 1300 (!) 141/68 - - 72 17 98 % - -  02/12/19 1217 - - - - - - 5\' 4"  (1.626 m) 103 lb (46.7 kg)  02/12/19 1216 116/64 97.8 F (36.6 C) Oral 81 14 100 % - -    General:  Thin, cachectic, no distress.   Eyes:  no scleral icterus.  Left eye ptosis.  ENT:  There were no oropharyngeal lesions.   Neck was without thyromegaly.   Lymphatics:  Negative cervical, supraclavicular or axillary adenopathy.   Respiratory: Expiratory wheezes noted throughout all lung fields. Cardiovascular:  Regular rate and rhythm, S1/S2, without murmur, rub or gallop.  There was no pedal edema.   GI:  abdomen was soft, flat, nontender, nondistended, without organomegaly.  Muscoloskeletal:  no spinal tenderness of palpation of vertebral spine.   Skin exam was without echymosis, petichae.   Neurological: Patient was alert  and oriented.  Attention was good.   Language was appropriate.  Mood was normal without depression.  Speech was not pressured.  Thought content was not tangential.  Strength and sensation symmetrical.   Lab Results  Component Value Date   WBC  4.6 02/13/2019   HGB 14.3 02/13/2019   HCT 40.7 02/13/2019   PLT 284 02/13/2019   GLUCOSE 166 (H) 02/13/2019   CHOL 239 (H) 06/08/2016   TRIG 101 06/08/2016   HDL 82 06/08/2016   LDLDIRECT 55 05/27/2017   LDLCALC 137 (H) 06/08/2016   ALT 11 02/13/2019   AST 40 02/13/2019   NA 130 (L) 02/13/2019   K 4.1 02/13/2019   CL 94 (L) 02/13/2019   CREATININE 0.45 02/13/2019   BUN 5 (L) 02/13/2019   CO2 21 (L) 02/13/2019    Dg Chest 2 View  Result Date: 01/21/2019 CLINICAL DATA:  Shortness of breath. EXAM: CHEST - 2 VIEW COMPARISON:  Radiographs of October 21, 2018.  CT scan of Jan 16, 2019. FINDINGS: The heart size and mediastinal contours are within normal limits. Both lungs are clear. Atherosclerosis of thoracic aorta is noted. No pneumothorax or pleural effusion is noted. Hyperexpansion of the lungs is noted. The visualized skeletal structures are unremarkable. IMPRESSION: No acute cardiopulmonary abnormality seen. Hyperexpansion of the lungs is noted consistent with chronic obstructive pulmonary disease. Aortic Atherosclerosis (ICD10-I70.0). Electronically Signed   By: Marijo Conception M.D.   On: 01/21/2019 13:36   Ct Chest W Contrast  Result Date: 01/16/2019 CLINICAL DATA:  Small cell lung cancer, status post SBRT EXAM: CT CHEST WITH CONTRAST TECHNIQUE: Multidetector CT imaging of the chest was performed during intravenous contrast administration. CONTRAST:  55mL OMNIPAQUE IOHEXOL 300 MG/ML  SOLN COMPARISON:  CT chest dated 07/01/2018.  PET-CT dated 06/24/2018. FINDINGS: Cardiovascular: Heart is normal in size.  No pericardial effusion. Evidence of thoracic aortic atherosclerotic calcifications aortic root/arch. Three vessel coronary atherosclerosis. Mediastinum/Nodes: Posterior mediastinal lymphadenopathy, predominantly along the descending thoracic aorta, measuring up to 2.3 cm short axis (series 2/image 139). This appearance is new from the prior. Visualized thyroid is unremarkable. Lungs/Pleura:  Prior hypermetabolic left suprahilar nodule is no longer evident the current CT. Suspected early localized radiation changes related to SBRT (series 7/image 51). Scattered right lung nodules, grossly unchanged, including: --1.5 cm cavitary nodule right upper lobe (series 7/image 36) --6 mm solid nodule in the right upper lobe (series 7/image 51) --2.3 cm sub solid nodule right (series 7/image 58) --7 mm subpleural nodule in the lateral right upper lobe (series 7/image 70) Mild centrilobular and paraseptal emphysematous changes. No focal consolidation. No pleural effusion or pneumothorax. Upper Abdomen: Upper abdominal lymphadenopathy, new from prior PET, including: --2.6 cm short axis portacaval node (series 2/image 155) --2.0 cm short axis node in the porta hepatis (series 2/image 167) --2.5 cm short axis peripancreatic node (series 2/image 175) --1.8 cm short axis left para-aortic node (series 2/image 183) Spleen is normal in size. Musculoskeletal: No focal osseous lesions. IMPRESSION: Prior left suprahilar nodule is no longer visualized. Suspected early localized radiation changes. Additional right lung nodules are grossly unchanged. Extensive lymphadenopathy in the posterior mediastinum and centered in the upper abdomen, incompletely visualized. Spleen is normal in size. This appearance is more worrisome for lymphoma or nodal metastases from an unidentified abdominal malignancy (neither of which are evident on prior PET) rather than nodal metastases related to the patient's known primary bronchogenic neoplasm. Spleen is normal in size. Consider discussion at multidisciplinary tumor board for assessment  of potential tissue sampling/nodal excision. PET-CT may also be beneficial. Aortic Atherosclerosis (ICD10-I70.0) and Emphysema (ICD10-J43.9). Electronically Signed   By: Julian Hy M.D.   On: 01/16/2019 15:34   Mr Brain Wo Contrast  Result Date: 02/12/2019 CLINICAL DATA:  73 year old female with left  3rd nerve palsy for 6 days. History of lung cancer treated earlier this year. EXAM: MRI HEAD WITHOUT CONTRAST TECHNIQUE: Multiplanar, multiecho pulse sequences of the brain and surrounding structures were obtained without intravenous contrast. COMPARISON:  None. FINDINGS: Study is intermittently degraded by motion artifact despite repeated imaging attempts. Brain: There is a small focus of restricted diffusion in the inferior left occipital lobe white matter best seen on series 11, image 11. However, no brainstem diffusion abnormality is identified. Faint if any associated T2 and FLAIR hyperintensity in the left occipital lobe white matter. No associated hemorrhage or mass effect. However, the right temporal lobe is abnormal, with mass-like soft tissue thickening and architectural distortion most apparent on series 12, image 15 and series 5, image 10. There is associated mass effect on the right temporal horn, although no ventriculomegaly. However, the abnormal parenchyma appears largely isointense to other gray and white matter signal. It is unclear whether the some of the right insula might also be affected and abnormally thickened (series 10, image 50). No midline shift. Basilar cisterns remain normal. The deep gray matter nuclei brainstem and cerebellum appear normal. No cortical encephalomalacia or chronic cerebral blood products. Cervicomedullary junction within normal limits. Vascular: Partial loss of the distal left vertebral artery flow void (series 5, image 2). Other Major intracranial vascular flow voids are preserved. Skull and upper cervical spine: Negative visible cervical spine. Visualized bone marrow signal is within normal limits. Sinuses/Orbits: The pituitary gland also appears at the upper limits of normal, but the suprasellar cistern is preserved. Normal noncontrast appearance of the cavernous sinuses. Postoperative changes to both globes, the intraorbital soft tissues appear normal. Paranasal  sinuses are clear. Other: Mastoid air cells are clear. Visible internal auditory structures appear normal. Scalp and face soft tissues appear negative. IMPRESSION: 1. Intermittently motion degraded study despite premedication and repeated imaging attempts. 2. The dominant abnormality is mass-like enlargement of the right temporal lobe, although the affected area appears largely isointense to normal brain parenchyma. The right insula and perhaps also the pituitary gland may be affected. Main differential considerations at this point are an infiltrative tumor, and cortical dysplasia (although the latter would seem unlikely if there is no chronic seizure history). Recommend follow-up Brain MRI with sedation (or anesthesia), done without and with IV contrast. 3. 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 No brainstem ischemia or abnormality identified. 4. 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 Electronically Signed   By: Genevie Ann M.D.   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 Extensive new mediastinal and partially visualized upper abdominal adenopathy on chest CT study. Patient presents for completion of restaging evaluation. EXAM: CT ABDOMEN AND PELVIS WITH CONTRAST TECHNIQUE: Multidetector CT imaging of the abdomen and pelvis was performed using the standard protocol following bolus administration of intravenous contrast. CONTRAST:  155mL OMNIPAQUE IOHEXOL 300 MG/ML  SOLN COMPARISON:  01/16/2027 chest CT.  06/24/2018 PET-CT. FINDINGS: Lower chest: No acute abnormality at the lung bases. Coronary atherosclerosis. Multiple enlarged heterogeneously enhancing bilateral posterior lower  mediastinal lymph nodes, largest 2.4 cm on the right (series 2/image 12), stable since recent  01/16/2019 chest CT, all new since 06/24/2018 PET-CT. Hepatobiliary: Normal liver size. No liver mass. Normal gallbladder with no radiopaque cholelithiasis. No biliary ductal dilatation. Pancreas: Normal, with no mass or duct dilation. Spleen: Normal size. No mass. Adrenals/Urinary Tract: Left adrenal 1.6 cm nodule with density 79 HU, new since 06/24/2018 PET-CT. No right adrenal nodules. Mild right hydroureteronephrosis to the level of the right ureterovesical junction without obstructing stone or discrete obstructing mass. No left hydronephrosis. Contrast nephrograms are symmetric and normal. No renal masses. Mildly distended and otherwise normal bladder. Stomach/Bowel: Normal non-distended stomach. Normal caliber small bowel with no small bowel wall thickening. Normal appendix. Oral contrast transits to the distal colon. Moderate sigmoid diverticulosis, with no large bowel wall thickening or significant pericolonic fat stranding. Vascular/Lymphatic: Atherosclerotic nonaneurysmal abdominal aorta. Patent portal, splenic, hepatic and renal veins. There is extensive confluent adenopathy throughout the gastrohepatic ligament, porta hepatis, portacaval, aortocaval, left para-aortic and left common and external iliac nodal chains, demonstrating heterogeneous enhancement, all new since 06/24/2018 PET-CT. Representative 2.9 cm gastrohepatic ligament node (series 2/image 21), which may directly invade the pancreatic body. Representative 2.3 cm porta hepatis node (series 2/image 20). Representative 2.2 cm aortocaval node (series 2/image 29). Representative 2.2 cm left para-aortic node (series 2/image 31). Representative 1.5 cm left common iliac node (series 2/image 42). Representative 1.5 cm left external iliac node (series 2/image 54). Reproductive: Status post hysterectomy, with no abnormal findings at the vaginal cuff. No adnexal mass. Other: No pneumoperitoneum, ascites or focal fluid collection. Musculoskeletal: No  aggressive appearing focal osseous lesions. Moderate lumbar spondylosis. IMPRESSION: 1. Extensive confluent adenopathy throughout the posterior lower mediastinum, retroperitoneum and left pelvis, all new since 06/24/2018 PET-CT, compatible with metastatic disease. The heterogeneous enhancement within these nodes is more typical of metastatic carcinoma than lymphoma. 2. New left adrenal metastasis. 3. New mild right hydroureteronephrosis without discrete obstructing stone or mass. Contrast nephrograms are symmetric and normal, suggesting absence of high-grade obstruction. 4.  Aortic Atherosclerosis (ICD10-I70.0). Electronically Signed   By: Ilona Sorrel M.D.   On: 01/28/2019 17:29   Dg Chest 2 View  Result Date: 01/21/2019 CLINICAL DATA:  Shortness of breath. EXAM: CHEST - 2 VIEW COMPARISON:  Radiographs of October 21, 2018.  CT scan of Jan 16, 2019. FINDINGS: The heart size and mediastinal contours are within normal limits. Both lungs are clear. Atherosclerosis of thoracic aorta is noted. No pneumothorax or pleural effusion is noted. Hyperexpansion of the lungs is noted. The visualized skeletal structures are unremarkable. IMPRESSION: No acute cardiopulmonary abnormality seen. Hyperexpansion of the lungs is noted consistent with chronic obstructive pulmonary disease. Aortic Atherosclerosis (ICD10-I70.0). Electronically Signed   By: Marijo Conception M.D.   On: 01/21/2019 13:36   Ct Chest W Contrast  Result Date: 01/16/2019 CLINICAL DATA:  Small cell lung cancer, status post SBRT EXAM: CT CHEST WITH CONTRAST TECHNIQUE: Multidetector CT imaging of the chest was performed during intravenous contrast administration. CONTRAST:  68mL OMNIPAQUE IOHEXOL 300 MG/ML  SOLN COMPARISON:  CT chest dated 07/01/2018.  PET-CT dated 06/24/2018. FINDINGS: Cardiovascular: Heart is normal in size.  No pericardial effusion. Evidence of thoracic aortic atherosclerotic calcifications aortic root/arch. Three vessel coronary  atherosclerosis. Mediastinum/Nodes: Posterior mediastinal lymphadenopathy, predominantly along the descending thoracic aorta, measuring up to 2.3 cm short axis (series 2/image 139). This appearance is new from the prior. Visualized thyroid is unremarkable. Lungs/Pleura: Prior hypermetabolic left suprahilar nodule is no longer  evident the current CT. Suspected early localized radiation changes related to SBRT (series 7/image 51). Scattered right lung nodules, grossly unchanged, including: --1.5 cm cavitary nodule right upper lobe (series 7/image 36) --6 mm solid nodule in the right upper lobe (series 7/image 51) --2.3 cm sub solid nodule right (series 7/image 58) --7 mm subpleural nodule in the lateral right upper lobe (series 7/image 70) Mild centrilobular and paraseptal emphysematous changes. No focal consolidation. No pleural effusion or pneumothorax. Upper Abdomen: Upper abdominal lymphadenopathy, new from prior PET, including: --2.6 cm short axis portacaval node (series 2/image 155) --2.0 cm short axis node in the porta hepatis (series 2/image 167) --2.5 cm short axis peripancreatic node (series 2/image 175) --1.8 cm short axis left para-aortic node (series 2/image 183) Spleen is normal in size. Musculoskeletal: No focal osseous lesions. IMPRESSION: Prior left suprahilar nodule is no longer visualized. Suspected early localized radiation changes. Additional right lung nodules are grossly unchanged. Extensive lymphadenopathy in the posterior mediastinum and centered in the upper abdomen, incompletely visualized. Spleen is normal in size. This appearance is more worrisome for lymphoma or nodal metastases from an unidentified abdominal malignancy (neither of which are evident on prior PET) rather than nodal metastases related to the patient's known primary bronchogenic neoplasm. Spleen is normal in size. Consider discussion at multidisciplinary tumor board for assessment of potential tissue sampling/nodal excision.  PET-CT may also be beneficial. Aortic Atherosclerosis (ICD10-I70.0) and Emphysema (ICD10-J43.9). Electronically Signed   By: Julian Hy M.D.   On: 01/16/2019 15:34   Mr Brain Wo Contrast  Result Date: 02/12/2019 CLINICAL DATA:  73 year old female with left 3rd nerve palsy for 6 days. History of lung cancer treated earlier this year. EXAM: MRI HEAD WITHOUT CONTRAST TECHNIQUE: Multiplanar, multiecho pulse sequences of the brain and surrounding structures were obtained without intravenous contrast. COMPARISON:  None. FINDINGS: Study is intermittently degraded by motion artifact despite repeated imaging attempts. Brain: There is a small focus of restricted diffusion in the inferior left occipital lobe white matter best seen on series 11, image 11. However, no brainstem diffusion abnormality is identified. Faint if any associated T2 and FLAIR hyperintensity in the left occipital lobe white matter. No associated hemorrhage or mass effect. However, the right temporal lobe is abnormal, with mass-like soft tissue thickening and architectural distortion most apparent on series 12, image 15 and series 5, image 10. There is associated mass effect on the right temporal horn, although no ventriculomegaly. However, the abnormal parenchyma appears largely isointense to other gray and white matter signal. It is unclear whether the some of the right insula might also be affected and abnormally thickened (series 10, image 50). No midline shift. Basilar cisterns remain normal. The deep gray matter nuclei brainstem and cerebellum appear normal. No cortical encephalomalacia or chronic cerebral blood products. Cervicomedullary junction within normal limits. Vascular: Partial loss of the distal left vertebral artery flow void (series 5, image 2). Other Major intracranial vascular flow voids are preserved. Skull and upper cervical spine: Negative visible cervical spine. Visualized bone marrow signal is within normal limits.  Sinuses/Orbits: The pituitary gland also appears at the upper limits of normal, but the suprasellar cistern is preserved. Normal noncontrast appearance of the cavernous sinuses. Postoperative changes to both globes, the intraorbital soft tissues appear normal. Paranasal sinuses are clear. Other: Mastoid air cells are clear. Visible internal auditory structures appear normal. Scalp and face soft tissues appear negative. IMPRESSION: 1. Intermittently motion degraded study despite premedication and repeated imaging attempts. 2. The dominant abnormality is  mass-like enlargement of the right temporal lobe, although the affected area appears largely isointense to normal brain parenchyma. The right insula and perhaps also the pituitary gland may be affected. Main differential considerations at this point are an infiltrative tumor, and cortical dysplasia (although the latter would seem unlikely if there is no chronic seizure history). Recommend follow-up Brain MRI with sedation (or anesthesia), done without and with IV contrast. 3. 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 No brainstem ischemia or abnormality identified. 4. 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 Electronically Signed   By: Genevie Ann M.D.   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 Extensive new mediastinal and partially visualized upper abdominal adenopathy on chest CT study. Patient presents for completion of restaging evaluation. EXAM: CT ABDOMEN AND PELVIS WITH CONTRAST TECHNIQUE: Multidetector CT imaging of the abdomen and pelvis was performed using the standard protocol following bolus administration of intravenous contrast. CONTRAST:  139mL OMNIPAQUE IOHEXOL 300 MG/ML  SOLN COMPARISON:  01/16/2027 chest CT.   06/24/2018 PET-CT. FINDINGS: Lower chest: No acute abnormality at the lung bases. Coronary atherosclerosis. Multiple enlarged heterogeneously enhancing bilateral posterior lower mediastinal lymph nodes, largest 2.4 cm on the right (series 2/image 12), stable since recent 01/16/2019 chest CT, all new since 06/24/2018 PET-CT. Hepatobiliary: Normal liver size. No liver mass. Normal gallbladder with no radiopaque cholelithiasis. No biliary ductal dilatation. Pancreas: Normal, with no mass or duct dilation. Spleen: Normal size. No mass. Adrenals/Urinary Tract: Left adrenal 1.6 cm nodule with density 79 HU, new since 06/24/2018 PET-CT. No right adrenal nodules. Mild right hydroureteronephrosis to the level of the right ureterovesical junction without obstructing stone or discrete obstructing mass. No left hydronephrosis. Contrast nephrograms are symmetric and normal. No renal masses. Mildly distended and otherwise normal bladder. Stomach/Bowel: Normal non-distended stomach. Normal caliber small bowel with no small bowel wall thickening. Normal appendix. Oral contrast transits to the distal colon. Moderate sigmoid diverticulosis, with no large bowel wall thickening or significant pericolonic fat stranding. Vascular/Lymphatic: Atherosclerotic nonaneurysmal abdominal aorta. Patent portal, splenic, hepatic and renal veins. There is extensive confluent adenopathy throughout the gastrohepatic ligament, porta hepatis, portacaval, aortocaval, left para-aortic and left common and external iliac nodal chains, demonstrating heterogeneous enhancement, all new since 06/24/2018 PET-CT. Representative 2.9 cm gastrohepatic ligament node (series 2/image 21), which may directly invade the pancreatic body. Representative 2.3 cm porta hepatis node (series 2/image 20). Representative 2.2 cm aortocaval node (series 2/image 29). Representative 2.2 cm left para-aortic node (series 2/image 31). Representative 1.5 cm left common iliac node (series  2/image 42). Representative 1.5 cm left external iliac node (series 2/image 54). Reproductive: Status post hysterectomy, with no abnormal findings at the vaginal cuff. No adnexal mass. Other: No pneumoperitoneum, ascites or focal fluid collection. Musculoskeletal: No aggressive appearing focal osseous lesions. Moderate lumbar spondylosis. IMPRESSION: 1. Extensive confluent adenopathy throughout the posterior lower mediastinum, retroperitoneum and left pelvis, all new since 06/24/2018 PET-CT, compatible with metastatic disease. The heterogeneous enhancement within these nodes is more typical of metastatic carcinoma than lymphoma. 2. New left adrenal metastasis. 3. New mild right hydroureteronephrosis without discrete obstructing stone or mass. Contrast nephrograms are symmetric and normal, suggesting absence of high-grade obstruction. 4.  Aortic Atherosclerosis (ICD10-I70.0). Electronically Signed   By: Ilona Sorrel M.D.   On: 01/28/2019 17:29    Assessment  and Plan:  1.  Brain mass and adenopathy 2.  History of stage Ia non-small cell lung cancer, adenocarcinoma 3.  Hyponatremia 4.  Left eyelid ptosis/diplopia 5. COPD 6. HTN  -Radiation oncology has been consulted to assist in the management of the brain mass.  A stat 3T MRI with SRS protocol has been ordered and will be performed later today. -Continue dexamethasone -Interventional radiology has been notified of the need for CT-guided biopsy of her retroperitoneal lymph nodes. -Await biopsy results.  Differential considerations include metastatic lung cancer versus lymphoma versus other.  Further treatment discussions will occur once the final pathology has resulted.  Thank you for this referral.  We will continue to follow this patient with you.  Mikey Bussing, DNP, AGPCNP-BC, AOCNP  Attending Note  I personally saw the patient, reviewed the chart and examined the patient. The plan of care was discussed with the patient and the admitting  team. I agree with the assessment and plan as documented above. Thank you very much for the consultation. History of stage Ia lung cancer Brain lesion: Will need radiation oncology evaluation. Awaiting biopsy results from the retroperitoneal lymph node biopsy. Outpatient follow-up depending on the pathology result.

## 2019-02-13 NOTE — Transfer of Care (Signed)
Immediate Anesthesia Transfer of Care Note  Patient: Pamela Russell  Procedure(s) Performed: BRAIN MRI (N/A )  Patient Location: PACU  Anesthesia Type:General  Level of Consciousness: awake and patient cooperative  Airway & Oxygen Therapy: Patient Spontanous Breathing and Patient connected to nasal cannula oxygen  Post-op Assessment: Report given to RN, Post -op Vital signs reviewed and stable and Patient moving all extremities X 4  Post vital signs: Reviewed and stable  Last Vitals:  Vitals Value Taken Time  BP 152/79 02/13/19 1500  Temp 36.2 C 02/13/19 1500  Pulse 94 02/13/19 1504  Resp 21 02/13/19 1504  SpO2 99 % 02/13/19 1504  Vitals shown include unvalidated device data.  Last Pain:  Vitals:   02/13/19 0331  TempSrc: Oral  PainSc:          Complications: No apparent anesthesia complications

## 2019-02-13 NOTE — Telephone Encounter (Signed)
I spoke with floor nurse, Sophia, about the requested brain MRI with sedation to be done at [facility] and a CT guided RP node biopsy to be done in [facility] IR department. Per [place] from anesthesia, the sedation Brain scan will be done at 12:15. Sophia will have [facility] bring pt to [facility] short stay by 11:00. The biopsy schedule is still pending. I spoke with Gareth Eagle in IR, who stated that they do not think the biopsy can be done today. This was passed on to [facility] PA-C and Dr. Lisbeth Renshaw. They will look into what needs to be done to get the biopsy sooner than the current scheduled time of Tuesday 6/30.    Sophia will coordinate [facility] transport for the brain MRI.   Mont Dutton R.T.(R)(T) Special Procedures Navigator

## 2019-02-13 NOTE — Progress Notes (Signed)
[facility] PROGRESS NOTE  Pamela Russell Pamela Russell DOB: [birth year] DOA: 02/12/2019 PCP: [place] Bing, DO  Assessment/Plan: Severe , Acute on chronic hyponatremia, asymptomatic.  Started on IV fluids on admission for sodium nadir of 115 6/25 at 2 PM has increased to 130 6/26(at 10 AM) has no changes in mental status on neuro exam we will continue to closely monitor have discontinued IV fluids in discussion with nephrology (briefly curbsided Dr. Jonnie Finner) given concern for potential metastatic lesions cannot be related to SIADH added to malignancy, cortisol and TSH within normal limits she looks euvolemic on exam, follow urine studies, reports not taking HCTZ at home  Brain mass and retroperitoneal lymphadenopathy.  Concerning for potential metastatic disease.  3T MRI concerning for mass in the pituitary and occipital lobe (unclear if infarct versus metastatic lesion).  IR has been consulted for CT-guided biopsy of a retroperitoneal lymph nodes to guide further treatment. medical oncology following.  Continue scheduled Decadron  History of non-small cell lung cancer Concerning retroperitoneal adenopathy and new left adrenal metastases on outpatient imaging in addition to new brain mass here.  IR consulted for biopsy of retroperitoneal , concern may not be able to do up to 6/30 because of 3T brain MRI had to be obtained today  Left eye ptosis with intermittent double vision.  Like related to brain mass.  Closely monitor has no other focal deficits.  COPD, stable no acute signs of exacerbation continue home inhalers  Hypertension, stable at goal.  Patient does not take amlodipine or HCTZ Code Status: DNR Family Communication: Please call daughter to Mont Dutton, [phone] daily with updates (indicate person spoken with, relationship, and if by phone, the number) Disposition Plan: Closely monitor Decadron, continue to monitor serial sodium/mental status  I spent greater than 30  minutes in providing  care to this patient.  Consultants:  Radiation oncology, medical oncology, IR  Procedures:  None  Antibiotics:  None (indicate start date, and stop date if known)   DVT prophylaxis  Lovenox  HPI/Subjective:  Pamela Russell is a 73 y.o. year old female with medical history significant for hypertension, hyperlipidemia, Copd, non-small cell lung cancer, s/p XRT x5  who presented on 02/12/2019 with one week complaint of inability to open up left eye on her own with double vision and was found to have acute on chronic severe hyponatremia and brain mass concerning for metastatic lesion.  No complaints "I feel great"  Objective: Vitals:   02/13/19 1530 02/13/19 1545  BP: 130/74 124/66  Pulse: 99 98  Resp: 17 19  Temp: (!) 97.5 F (36.4 C)   SpO2: 100% 100%    Intake/Output Summary (Last 24 hours) at 02/13/2019 1555 Last data filed at 02/13/2019 1504 Gross per 24 hour  Intake 1278.64 ml  Output -  Net 1278.64 ml   Filed Weights   02/12/19 1217 02/12/19 2109 02/13/19 1155  Weight: 46.7 kg 47.8 kg 47.8 kg    Exam:   General: Elderly female, sitting in bed in no distress  Cardiovascular: Regular rate and rhythm, no edema  Respiratory: Normal effort on room air, no wheezes, occasional rhonchi  Abdomen: Soft, nondistended, nontender  Musculoskeletal: Normal range of motion  Skin no rashes or lesions, skin dry and intact  Neurologic alert and oriented x4.  Left eye closed to pry open with extra ocular movement intact bilaterally, good strength both upper and lower extremities without any appreciable focal deficits  Data Reviewed: Basic Metabolic Panel: Recent Labs  Lab  02/12/19 1351 02/12/19 1405 02/12/19 2239 02/13/19 0424 02/13/19 1040  NA 118* 115* 122* 128* 130*  K 3.0* 3.2* 3.6 4.5 4.1  CL 81* 77* 88* 92* 94*  CO2 27  --  21* 19* 21*  GLUCOSE 90 90 101* 142* 166*  BUN 7* 5* <5* 5* 5*  CREATININE 0.31* 0.40* <0.30* 0.44 0.45   CALCIUM 8.7*  --  8.4* 9.3 9.3  MG 1.7  --   --  2.2  --    Liver Function Tests: Recent Labs  Lab 02/12/19 1351 02/13/19 0424  AST 58* 40  ALT 11 11  ALKPHOS 71 65  BILITOT 0.7 0.4  PROT 7.0 6.8  ALBUMIN 3.8 3.6   No results for input(s): LIPASE, AMYLASE in the last 168 hours. No results for input(s): AMMONIA in the last 168 hours. CBC: Recent Labs  Lab 02/12/19 1351 02/12/19 1405 02/13/19 0424  WBC 6.4  --  4.6  NEUTROABS 4.2  --   --   HGB 13.9 14.6 14.3  HCT 38.7 43.0 40.7  MCV 89.4  --  91.3  PLT 282  --  284   Cardiac Enzymes: No results for input(s): CKTOTAL, CKMB, CKMBINDEX, TROPONINI in the last 168 hours. BNP (last 3 results) No results for input(s): BNP in the last 8760 hours.  ProBNP (last 3 results) No results for input(s): PROBNP in the last 8760 hours.  CBG: No results for input(s): GLUCAP in the last 168 hours.  Recent Results (from the past 240 hour(s))  Novel Coronavirus,NAA,(SEND-OUT TO REF LAB - TAT 24-48 hrs); Hosp Order     Status: None   Collection Time: 02/12/19  1:28 PM   Specimen: Nasopharyngeal Swab; Respiratory  Result Value Ref Range Status   SARS-CoV-2, NAA NOT DETECTED NOT DETECTED Final    Comment: (NOTE) This test was developed and its performance characteristics determined by [employer]. This test has not been FDA cleared or approved. This test has been authorized by FDA under an Emergency Use Authorization (EUA). This test is only authorized for the duration of time the declaration that circumstances exist justifying the authorization of the emergency use of in vitro diagnostic tests for detection of SARS-CoV-2 virus and/or diagnosis of COVID-19 infection under section 564(b)(1) of the Act, 21 U.S.C. 481EHU-3(J)(4), unless the authorization is terminated or revoked sooner. When diagnostic testing is negative, the possibility of a false negative result should be considered in the context of a patient's recent  exposures and the presence of clinical signs and symptoms consistent with COVID-19. An individual without symptoms of COVID-19 and who is not shedding SARS-CoV-2 virus would expect to have a negative (not detected) result in this assay. Performed  At: [facility] [place], [place] [id number] Rush Farmer MD YI:[id number]    [place]  Final    Comment: Performed at [facility] [address] [place], [place] [address]     Studies: Mr Brain 71 Contrast  Result Date: 02/12/2019 CLINICAL DATA:  73 year old female with left 3rd nerve palsy for 6 days. History of lung cancer treated earlier this year. EXAM: MRI HEAD WITHOUT CONTRAST TECHNIQUE: Multiplanar, multiecho pulse sequences of the brain and surrounding structures were obtained without intravenous contrast. COMPARISON:  None. FINDINGS: Study is intermittently degraded by motion artifact despite repeated imaging attempts. Brain: There is a small focus of restricted diffusion in the inferior left occipital lobe white matter best seen on series 11, image 11. However, no brainstem diffusion  abnormality is identified. Faint if any associated T2 and FLAIR hyperintensity in the left occipital lobe white matter. No associated hemorrhage or mass effect. However, the right temporal lobe is abnormal, with mass-like soft tissue thickening and architectural distortion most apparent on series 12, image 15 and series 5, image 10. There is associated mass effect on the right temporal horn, although no ventriculomegaly. However, the abnormal parenchyma appears largely isointense to other gray and white matter signal. It is unclear whether the some of the right insula might also be affected and abnormally thickened (series 10, image 50). No midline shift. Basilar cisterns remain normal. The deep gray matter nuclei brainstem and cerebellum appear normal. No cortical encephalomalacia or chronic  cerebral blood products. Cervicomedullary junction within normal limits. Vascular: Partial loss of the distal left vertebral artery flow void (series 5, image 2). Other Major intracranial vascular flow voids are preserved. Skull and upper cervical spine: Negative visible cervical spine. Visualized bone marrow signal is within normal limits. Sinuses/Orbits: The pituitary gland also appears at the upper limits of normal, but the suprasellar cistern is preserved. Normal noncontrast appearance of the cavernous sinuses. Postoperative changes to both globes, the intraorbital soft tissues appear normal. Paranasal sinuses are clear. Other: Mastoid air cells are clear. Visible internal auditory structures appear normal. Scalp and face soft tissues appear negative. IMPRESSION: 1. Intermittently motion degraded study despite premedication and repeated imaging attempts. 2. The dominant abnormality is mass-like enlargement of the right temporal lobe, although the affected area appears largely isointense to normal brain parenchyma. The right insula and perhaps also the pituitary gland may be affected. Main differential considerations at this point are an infiltrative tumor, and cortical dysplasia (although the latter would seem unlikely if there is no chronic seizure history). Recommend follow-up Brain MRI with sedation (or anesthesia), done without and with IV contrast. 3. 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 No brainstem ischemia or abnormality identified. 4. 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 Electronically Signed   By: Genevie Ann M.D.   On: 02/12/2019 15:13    Scheduled Meds: . [MAR Hold] dexamethasone  4 mg Intravenous Q6H  . [MAR Hold] enoxaparin (LOVENOX) injection  40 mg Subcutaneous Q24H  . [MAR Hold] fluticasone  2 spray Each Nare Daily  . [MAR Hold] Glycopyrrolate-Formoterol  2 puff Inhalation BID  . [MAR Hold] loratadine  10 mg Oral Daily   Continuous Infusions: .  lactated ringers 10 mL/hr at 02/13/19 1156    Active Problems:   Malignant neoplasm of right upper lobe of lung ([place])   Hyponatremia   Hypokalemia   Ptosis      Desiree Hane  [facility]

## 2019-02-13 NOTE — Plan of Care (Signed)
  Problem: Education: Goal: Knowledge of General Education information will improve Description: Including pain rating scale, medication(s)/side effects and non-pharmacologic comfort measures Outcome: Progressing   Problem: Activity: Goal: Risk for activity intolerance will decrease Outcome: Progressing   Problem: Nutrition: Goal: Adequate nutrition will be maintained  Outcome: Not Progressing

## 2019-02-13 NOTE — Anesthesia Preprocedure Evaluation (Signed)
Anesthesia Evaluation  Patient identified by MRN, date of birth, ID band Patient awake    Reviewed: Allergy & Precautions, NPO status , Patient's Chart, lab work & pertinent test results  Airway Mallampati: II  TM Distance: <3 FB Neck ROM: Full    Dental no notable dental hx.    Pulmonary COPD, Current Smoker,  Metastatic lung ca   Pulmonary exam normal breath sounds clear to auscultation       Cardiovascular hypertension, Normal cardiovascular exam Rhythm:Regular Rate:Normal     Neuro/Psych negative neurological ROS  negative psych ROS   GI/Hepatic negative GI ROS, Neg liver ROS,   Endo/Other  negative endocrine ROS  Renal/GU negative Renal ROS  negative genitourinary   Musculoskeletal negative musculoskeletal ROS (+)   Abdominal   Peds negative pediatric ROS (+)  Hematology negative hematology ROS (+)   Anesthesia Other Findings   Reproductive/Obstetrics negative OB ROS                             Anesthesia Physical Anesthesia Plan  ASA: IV  Anesthesia Plan: General   Post-op Pain Management:    Induction: Intravenous and Rapid sequence  PONV Risk Score and Plan: 3 and Ondansetron, Dexamethasone and Treatment may vary due to age or medical condition  Airway Management Planned: Oral ETT  Additional Equipment:   Intra-op Plan:   Post-operative Plan: Extubation in OR  Informed Consent: I have reviewed the patients History and Physical, chart, labs and discussed the procedure including the risks, benefits and alternatives for the proposed anesthesia with the patient or authorized representative who has indicated his/her understanding and acceptance.     Dental advisory given  Plan Discussed with: CRNA and Surgeon  Anesthesia Plan Comments:         Anesthesia Quick Evaluation

## 2019-02-13 NOTE — Progress Notes (Signed)
Patient arrived to room 1432 from ED via stretcher with belongings. Report had been received from [place] at 2033. She arrived alert, oriented, and able to participate in admission assessment and history. She denied pain and/or shortness of breath.

## 2019-02-13 NOTE — Progress Notes (Addendum)
Dr. Lisbeth Renshaw is aware of the patient's admission. Please see our previous notes, she was in the process of having IR biopsy retroperitoneal nodes prior to her admission. Orders were placed for STAT 3T MRI with SRS protocol that will have to be done at [facility]. She did not have adequate visualization as well due to 1.5T scan and motion despite 1 mg Ativan. I will touch base with the patient this am as well.     Carola Rhine, [facility] Radiation Oncology

## 2019-02-13 NOTE — Anesthesia Postprocedure Evaluation (Signed)
Anesthesia Post Note  Patient: Pamela Russell  Procedure(s) Performed: BRAIN MRI (N/A )     Patient location during evaluation: PACU Anesthesia Type: General Level of consciousness: awake and alert Pain management: pain level controlled Vital Signs Assessment: post-procedure vital signs reviewed and stable Respiratory status: spontaneous breathing, nonlabored ventilation, respiratory function stable and patient connected to nasal cannula oxygen Cardiovascular status: blood pressure returned to baseline and stable Postop Assessment: no apparent nausea or vomiting Anesthetic complications: no    Last Vitals:  Vitals:   02/13/19 1500 02/13/19 1515  BP: (!) 152/79 (!) 143/75  Pulse: 98 100  Resp: 19 19  Temp: (!) 36.2 C   SpO2: 100% 96%    Last Pain:  Vitals:   02/13/19 1515  TempSrc:   PainSc: 0-No pain                 Pamela Russell S

## 2019-02-14 DIAGNOSIS — I10 Essential (primary) hypertension: Secondary | ICD-10-CM

## 2019-02-14 DIAGNOSIS — H02402 Unspecified ptosis of left eyelid: Secondary | ICD-10-CM

## 2019-02-14 DIAGNOSIS — G939 Disorder of brain, unspecified: Secondary | ICD-10-CM

## 2019-02-14 DIAGNOSIS — J449 Chronic obstructive pulmonary disease, unspecified: Secondary | ICD-10-CM

## 2019-02-14 DIAGNOSIS — E46 Unspecified protein-calorie malnutrition: Secondary | ICD-10-CM

## 2019-02-14 DIAGNOSIS — R590 Localized enlarged lymph nodes: Secondary | ICD-10-CM

## 2019-02-14 DIAGNOSIS — E871 Hypo-osmolality and hyponatremia: Secondary | ICD-10-CM

## 2019-02-14 DIAGNOSIS — Z85118 Personal history of other malignant neoplasm of bronchus and lung: Secondary | ICD-10-CM

## 2019-02-14 LAB — BASIC METABOLIC PANEL
Anion gap: 11 (ref 5–15)
BUN: 7 mg/dL — ABNORMAL LOW (ref 8–23)
CO2: 24 mmol/L (ref 22–32)
Calcium: 9.3 mg/dL (ref 8.9–10.3)
Chloride: 101 mmol/L (ref 98–111)
Creatinine, Ser: 0.48 mg/dL (ref 0.44–1.00)
GFR calc Af Amer: >60 mL/min (ref >60)
GFR calc non Af Amer: >60 mL/min (ref >60)
Glucose, Bld: 139 mg/dL — ABNORMAL HIGH (ref 70–99)
Potassium: 4.6 mmol/L (ref 3.5–5.1)
Sodium: 136 mmol/L (ref 135–145)

## 2019-02-14 LAB — CBC
HCT: 34.6 % — ABNORMAL LOW (ref 36.0–46.0)
Hemoglobin: 11.7 g/dL — ABNORMAL LOW (ref 12.0–15.0)
MCH: 31.7 pg (ref 26.0–34.0)
MCHC: 33.8 g/dL (ref 30.0–36.0)
MCV: 93.8 fL (ref 80.0–100.0)
Platelets: 319 10*3/uL (ref 150–400)
RBC: 3.69 MIL/uL — ABNORMAL LOW (ref 3.87–5.11)
RDW: 14 % (ref 11.5–15.5)
WBC: 13.1 10*3/uL — ABNORMAL HIGH (ref 4.0–10.5)
nRBC: 0 % (ref 0.0–0.2)

## 2019-02-14 LAB — HEPATITIS PANEL, ACUTE
HCV Ab: <0.1 s/co ratio (ref 0.0–0.9)
Hep A IgM: NEGATIVE
Hep B C IgM: NEGATIVE
Hepatitis B Surface Ag: NEGATIVE

## 2019-02-14 NOTE — Progress Notes (Signed)
PT takes Bevespi  Aserosphere inhaler. Shows on PT Work list but not on MAR. PT received medication this AM. Sp02 on RA 97% , HR 87, RR 16, BBS diminished- clear.

## 2019-02-14 NOTE — Progress Notes (Signed)
PROGRESS NOTE  Pamela Russell LXB:[id number] DOB: April 08, 1946 DOA: 02/12/2019 PCP: Porter Bing, DO  HPI/Recap of past 24 hours: Pamela Russell is a 73 y.o. year old female with medical history significant for hypertension, hyperlipidemia, Copd, non-small cell lung cancer, s/p XRTx5who presented on 02/12/2019 with one week complaint of inability to open up left eye on her own with double vision and was found to have acute on chronic severe hyponatremia and brain mass concerning for metastatic lesion.  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  Patient is being set up for retroperitoneal lymph node biopsy on Monday, 6/29.  Patient sodium has since improved and is at 136 today.  She has no complaints and is feeling well.  Assessment/Plan: Active Problems:   Malignant neoplasm of right upper lobe of lung [facility]): Plan for retroperitoneal lymph node biopsy on Monday 6/29.  Oncology following.     Hyponatremia: Since resolved.  Unclear etiology.  Discussed by phone with nephrology and given concerns for possible metastases, SIADH could not be ruled out.  Appears euvolemic for now.   Hypokalemia: Stable.  Replaced.   Ptosis with intermittent episodes of diplopia: Unclear etiology.   ?From pituitary adenoma   Brain mass: Oncology reviewed MRI.  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 Leukocytosis: Likely secondary to steroids.  Patient afebrile.  Suspect protein calorie malnutrition: Consult dietary Code Status: DNR  Family Communication: Updated daughter by phone   Disposition Plan: Anticipate discharge home following biopsy and results.   Consultants:  Oncology  Radiation oncology  Interventional radiology  Procedures:  Planned retroperitoneal lymph node biopsy 6/29  Antimicrobials:  None  DVT prophylaxis: Lovenox   Objective: Vitals:   02/14/19 0410 02/14/19 1417  BP: 124/65 (!) 143/71  Pulse: 92 91  Resp: 16 16  Temp: 97.8 F (36.6  C) 98.2 F (36.8 C)  SpO2: 96% 100%    Intake/Output Summary (Last 24 hours) at 02/14/2019 1648 Last data filed at 02/14/2019 1647 Gross per 24 hour  Intake 400.78 ml  Output 1625 ml  Net -1224.22 ml   Filed Weights   02/12/19 1217 02/12/19 2109 02/13/19 1155  Weight: 46.7 kg 47.8 kg 47.8 kg   Body mass index is 18.08 kg/m.  Exam:   General: Alert and oriented x3, no acute distress  HEENT: Normocephalic and atraumatic, mucous membranes slightly dry.  Right eyelid closed  Cardiovascular: Regular rate and rhythm, S1-S2  Respiratory: Clear to auscultation bilaterally  Abdomen: Soft, nontender, nondistended, positive bowel sounds  Musculoskeletal: No clubbing or cyanosis or edema  Skin: No skin breaks, tears or lesions  Psychiatry: Appropriate, no evidence of psychoses   Data Reviewed: CBC: Recent Labs  Lab 02/12/19 1351 02/12/19 1405 02/13/19 0424 02/14/19 0444  WBC 6.4  --  4.6 13.1*  NEUTROABS 4.2  --   --   --   HGB 13.9 14.6 14.3 11.7*  HCT 38.7 43.0 40.7 34.6*  MCV 89.4  --  91.3 93.8  PLT 282  --  284 416   Basic Metabolic Panel: Recent Labs  Lab 02/12/19 1351 02/12/19 1405 02/12/19 2239 02/13/19 0424 02/13/19 1040 02/14/19 0444  NA 118* 115* 122* 128* 130* 136  K 3.0* 3.2* 3.6 4.5 4.1 4.6  CL 81* 77* 88* 92* 94* 101  CO2 27  --  21* 19* 21* 24  GLUCOSE 90 90 101* 142* 166* 139*  BUN 7* 5* <5* 5* 5* 7*  CREATININE 0.31* 0.40* <0.30* 0.44 0.45 0.48  CALCIUM 8.7*  --  8.4* 9.3 9.3 9.3  MG 1.7  --   --  2.2  --   --    GFR: Estimated Creatinine Clearance: 48 mL/min (by C-G formula based on SCr of 0.48 mg/dL). Liver Function Tests: Recent Labs  Lab 02/12/19 1351 02/13/19 0424  AST 58* 40  ALT 11 11  ALKPHOS 71 65  BILITOT 0.7 0.4  PROT 7.0 6.8  ALBUMIN 3.8 3.6   No results for input(s): LIPASE, AMYLASE in the last 168 hours. No results for input(s): AMMONIA in the last 168 hours. Coagulation Profile: Recent Labs  Lab 02/12/19  1351  INR 0.8   Cardiac Enzymes: No results for input(s): CKTOTAL, CKMB, CKMBINDEX, TROPONINI in the last 168 hours. BNP (last 3 results) No results for input(s): PROBNP in the last 8760 hours. HbA1C: No results for input(s): HGBA1C in the last 72 hours. CBG: No results for input(s): GLUCAP in the last 168 hours. Lipid Profile: No results for input(s): CHOL, HDL, LDLCALC, TRIG, CHOLHDL, LDLDIRECT in the last 72 hours. Thyroid Function Tests: Recent Labs    02/12/19 1351  TSH 0.460   Anemia Panel: No results for input(s): VITAMINB12, FOLATE, FERRITIN, TIBC, IRON, RETICCTPCT in the last 72 hours. Urine analysis:    Component Value Date/Time   COLORURINE YELLOW 02/12/2019 1351   APPEARANCEUR CLEAR 02/12/2019 1351   LABSPEC 1.004 (L) 02/12/2019 1351   PHURINE 6.0 02/12/2019 1351   GLUCOSEU NEGATIVE 02/12/2019 1351   HGBUR NEGATIVE 02/12/2019 1351   BILIRUBINUR NEGATIVE 02/12/2019 1351   KETONESUR 20 (A) 02/12/2019 1351   PROTEINUR NEGATIVE 02/12/2019 1351   NITRITE NEGATIVE 02/12/2019 1351   LEUKOCYTESUR NEGATIVE 02/12/2019 1351   Sepsis Labs: @LABRCNTIP (procalcitonin:4,lacticidven:4)  ) Recent Results (from the past 240 hour(s))  Novel Coronavirus,NAA,(SEND-OUT TO REF LAB - TAT 24-48 hrs); Hosp Order     Status: None   Collection Time: 02/12/19  1:28 PM   Specimen: Nasopharyngeal Swab; Respiratory  Result Value Ref Range Status   SARS-CoV-2, NAA NOT DETECTED NOT DETECTED Final    Comment: (NOTE) This test was developed and its performance characteristics determined by [employer]. This test has not been FDA cleared or approved. This test has been authorized by FDA under an Emergency Use Authorization (EUA). This test is only authorized for the duration of time the declaration that circumstances exist justifying the authorization of the emergency use of in vitro diagnostic tests for detection of SARS-CoV-2 virus and/or diagnosis of COVID-19 infection under  section 564(b)(1) of the Act, 21 U.S.C. 539JQB-3(A)(1), unless the authorization is terminated or revoked sooner. When diagnostic testing is negative, the possibility of a false negative result should be considered in the context of a patient's recent exposures and the presence of clinical signs and symptoms consistent with COVID-19. An individual without symptoms of COVID-19 and who is not shedding SARS-CoV-2 virus would expect to have a negative (not detected) result in this assay. Performed  At: [facility] [place], [place] [id number] Rush Farmer MD BD:[id number]    [place]  Final    Comment: Performed at [facility] [address], [place], [place] [address]      Studies: No results found.  Scheduled Meds: . dexamethasone  4 mg Intravenous Q6H  . enoxaparin (LOVENOX) injection  40 mg Subcutaneous Q24H  . fluticasone  2 spray Each Nare Daily  . Glycopyrrolate-Formoterol  2 puff Inhalation BID  . loratadine  10 mg Oral Daily  Continuous Infusions: . lactated ringers 10 mL/hr at 02/14/19 1215     LOS: 2 days     Annita Brod, MD [facility]  To reach me or the doctor on call, go to: [email or web address] Password [facility]  02/14/2019, 4:48 PM

## 2019-02-14 NOTE — Progress Notes (Signed)
HEMATOLOGY-ONCOLOGY PROGRESS NOTE  SUBJECTIVE: Prior history of stage I lung cancer treated with radiation alone.  She has been admitted with 1 week history of left eyelid drooping and double vision.  Initial MRI done in the emergency room was not very clear so she underwent a second MRI under anesthesia yesterday.  It showed a pituitary adenoma and a 6 mm lesion which could be an infarct.  She is being set up for a retroperitoneal lymph node biopsy on Monday.  OBJECTIVE: REVIEW OF SYSTEMS:   As above all other systems are negative I have reviewed the past medical history, past surgical history, social history and family history with the patient and they are unchanged from previous note.   PHYSICAL EXAMINATION: ECOG PERFORMANCE STATUS: 1 - Symptomatic but completely ambulatory  Vitals:   02/13/19 2139 02/14/19 0410  BP: (!) 146/79 124/65  Pulse: (!) 101 92  Resp: 16 16  Temp: 98.2 F (36.8 C) 97.8 F (36.6 C)  SpO2: 100% 96%   Filed Weights   02/12/19 1217 02/12/19 2109 02/13/19 1155  Weight: 103 lb (46.7 kg) 105 lb 6.1 oz (47.8 kg) 105 lb 6.1 oz (47.8 kg)    GENERAL:alert, no distress and comfortable SKIN: skin color, texture, turgor are normal, no rashes or significant lesions EYES: Left eyelid is closed and diplopia when it opens OROPHARYNX:no exudate, no erythema and lips, buccal mucosa, and tongue normal  NECK: supple, thyroid normal size, non-tender, without nodularity LYMPH:  no palpable lymphadenopathy in the cervical, axillary or inguinal LUNGS: clear to auscultation and percussion with normal breathing effort HEART: regular rate & rhythm and no murmurs and no lower extremity edema ABDOMEN:abdomen soft, non-tender and normal bowel sounds Musculoskeletal:no cyanosis of digits and no clubbing  NEURO: alert & oriented x 3 with fluent speech, no focal motor/sensory deficits  LABORATORY DATA:  I have reviewed the data as listed CMP Latest Ref Rng & Units 02/14/2019  02/13/2019 02/13/2019  Glucose 70 - 99 mg/dL 139(H) 166(H) 142(H)  BUN 8 - 23 mg/dL 7(L) 5(L) 5(L)  Creatinine 0.44 - 1.00 mg/dL 0.48 0.45 0.44  Sodium 135 - 145 mmol/L 136 130(L) 128(L)  Potassium 3.5 - 5.1 mmol/L 4.6 4.1 4.5  Chloride 98 - 111 mmol/L 101 94(L) 92(L)  CO2 22 - 32 mmol/L 24 21(L) 19(L)  Calcium 8.9 - 10.3 mg/dL 9.3 9.3 9.3  Total Protein 6.5 - 8.1 g/dL - - 6.8  Total Bilirubin 0.3 - 1.2 mg/dL - - 0.4  Alkaline Phos 38 - 126 U/L - - 65  AST 15 - 41 U/L - - 40  ALT 0 - 44 U/L - - 11    Lab Results  Component Value Date   WBC 13.1 (H) 02/14/2019   HGB 11.7 (L) 02/14/2019   HCT 34.6 (L) 02/14/2019   MCV 93.8 02/14/2019   PLT 319 02/14/2019   NEUTROABS 4.2 02/12/2019    ASSESSMENT AND PLAN: 1.  History of stage I lung cancer 2.  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  Radiation oncology is following the patient. 3.  Retroperitoneal lymphadenopathy: Biopsy being planned for Monday. From medical oncology standpoint there is no further recommendations for the weekend. We will await the results of the retroperitoneal lymph node biopsy.  She will be followed by Dr. Julien Nordmann as outpatient.

## 2019-02-15 ENCOUNTER — Encounter (HOSPITAL_COMMUNITY): Payer: Self-pay | Admitting: Radiology

## 2019-02-15 DIAGNOSIS — D72829 Elevated white blood cell count, unspecified: Secondary | ICD-10-CM | POA: Diagnosis not present

## 2019-02-15 LAB — BRAIN NATRIURETIC PEPTIDE: B Natriuretic Peptide: 144.3 pg/mL — ABNORMAL HIGH (ref 0.0–100.0)

## 2019-02-15 MED ORDER — HYDRALAZINE HCL 20 MG/ML IJ SOLN: 5.0000 mg | INTRAMUSCULAR | Status: DC | PRN

## 2019-02-15 MED ORDER — ENSURE ENLIVE PO LIQD
237.0000 mL | Freq: Two times a day (BID) | ORAL | Status: DC
  Administered 2019-02-15 – 2019-02-16 (×3): 237 mL via ORAL

## 2019-02-15 NOTE — Progress Notes (Addendum)
PT received home med Bevespi 2 puffs (7215 on Flowsheet).

## 2019-02-15 NOTE — Progress Notes (Signed)
PROGRESS NOTE  Pamela Russell NLZ:[id number] DOB: 08/02/1946 DOA: 02/12/2019 PCP: Alta Bing, DO  HPI/Recap of past 24 hours: Pamela Russell is a 73 y.o. year old female with medical history significant for hypertension, hyperlipidemia, Copd, non-small cell lung cancer, s/p XRTx5who presented on 02/12/2019 with one week complaint of inability to open up left eye on her own with double vision and was found to have acute on chronic severe hyponatremia and brain mass concerning for metastatic lesion.  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  Patient is being set up for retroperitoneal lymph node biopsy on Monday, 6/29.  Patient sodium has since improved, increasing to 136 by 6/27. she has no complaints and is feeling well.  Assessment/Plan: Active Problems:   Malignant neoplasm of right upper lobe of lung [facility]): Plan for retroperitoneal lymph node biopsy on Monday 6/29.  Oncology following.     Hyponatremia: Since resolved.  Unclear etiology.  Discussed by phone with nephrology and given concerns for possible metastases, SIADH could not be ruled out.  Appears euvolemic for now.  Essential hypertension: Somewhat higher over the last few days.  Will give PRN hydralazine.  Looking at her previous echo in 2019, she has some possible mild diastolic dysfunction.  Will check a BNP    Hypokalemia: Stable.  Replaced.    Ptosis with intermittent episodes of diplopia: Unclear etiology.   ?From pituitary adenoma    Brain mass: Oncology reviewed MRI.  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  Leukocytosis: Likely secondary to steroids.  Patient afebrile.  Suspect protein calorie malnutrition: Consult dietary  Code Status: DNR  Family Communication: Left message for daughter on phone  Disposition Plan: Anticipate discharge home following biopsy and results.   Consultants:  Oncology  Radiation oncology  Interventional radiology  Procedures:  Planned  retroperitoneal lymph node biopsy 6/29  Antimicrobials:  None  DVT prophylaxis: Lovenox   Objective: Vitals:   02/15/19 1312 02/15/19 1359  BP: (!) 162/77 (!) 156/79  Pulse: 91 94  Resp: 20   Temp: 97.8 F (36.6 C)   SpO2: 100%     Intake/Output Summary (Last 24 hours) at 02/15/2019 1629 Last data filed at 02/15/2019 1354 Gross per 24 hour  Intake 387.7 ml  Output 1500 ml  Net -1112.3 ml   Filed Weights   02/12/19 1217 02/12/19 2109 02/13/19 1155  Weight: 46.7 kg 47.8 kg 47.8 kg   Body mass index is 18.08 kg/m.  Exam:   General: Alert and oriented x3, no acute distress  HEENT: Normocephalic and atraumatic, mucous membranes slightly dry.  Right eyelid closed  Cardiovascular: Regular rate and rhythm, S1-S2  Respiratory: Clear to auscultation bilaterally  Abdomen: Soft, nontender, nondistended, positive bowel sounds  Musculoskeletal: No clubbing or cyanosis or edema  Skin: No skin breaks, tears or lesions  Psychiatry: Appropriate, no evidence of psychoses   Data Reviewed: CBC: Recent Labs  Lab 02/12/19 1351 02/12/19 1405 02/13/19 0424 02/14/19 0444  WBC 6.4  --  4.6 13.1*  NEUTROABS 4.2  --   --   --   HGB 13.9 14.6 14.3 11.7*  HCT 38.7 43.0 40.7 34.6*  MCV 89.4  --  91.3 93.8  PLT 282  --  284 902   Basic Metabolic Panel: Recent Labs  Lab 02/12/19 1351 02/12/19 1405 02/12/19 2239 02/13/19 0424 02/13/19 1040 02/14/19 0444  NA 118* 115* 122* 128* 130* 136  K 3.0* 3.2* 3.6 4.5 4.1 4.6  CL 81* 77*  88* 92* 94* 101  CO2 27  --  21* 19* 21* 24  GLUCOSE 90 90 101* 142* 166* 139*  BUN 7* 5* <5* 5* 5* 7*  CREATININE 0.31* 0.40* <0.30* 0.44 0.45 0.48  CALCIUM 8.7*  --  8.4* 9.3 9.3 9.3  MG 1.7  --   --  2.2  --   --    GFR: Estimated Creatinine Clearance: 48 mL/min (by C-G formula based on SCr of 0.48 mg/dL). Liver Function Tests: Recent Labs  Lab 02/12/19 1351 02/13/19 0424  AST 58* 40  ALT 11 11  ALKPHOS 71 65  BILITOT 0.7 0.4   PROT 7.0 6.8  ALBUMIN 3.8 3.6   No results for input(s): LIPASE, AMYLASE in the last 168 hours. No results for input(s): AMMONIA in the last 168 hours. Coagulation Profile: Recent Labs  Lab 02/12/19 1351  INR 0.8   Cardiac Enzymes: No results for input(s): CKTOTAL, CKMB, CKMBINDEX, TROPONINI in the last 168 hours. BNP (last 3 results) No results for input(s): PROBNP in the last 8760 hours. HbA1C: No results for input(s): HGBA1C in the last 72 hours. CBG: No results for input(s): GLUCAP in the last 168 hours. Lipid Profile: No results for input(s): CHOL, HDL, LDLCALC, TRIG, CHOLHDL, LDLDIRECT in the last 72 hours. Thyroid Function Tests: No results for input(s): TSH, T4TOTAL, FREET4, T3FREE, THYROIDAB in the last 72 hours. Anemia Panel: No results for input(s): VITAMINB12, FOLATE, FERRITIN, TIBC, IRON, RETICCTPCT in the last 72 hours. Urine analysis:    Component Value Date/Time   COLORURINE YELLOW 02/12/2019 1351   APPEARANCEUR CLEAR 02/12/2019 1351   LABSPEC 1.004 (L) 02/12/2019 1351   PHURINE 6.0 02/12/2019 1351   GLUCOSEU NEGATIVE 02/12/2019 1351   HGBUR NEGATIVE 02/12/2019 1351   BILIRUBINUR NEGATIVE 02/12/2019 1351   KETONESUR 20 (A) 02/12/2019 1351   PROTEINUR NEGATIVE 02/12/2019 1351   NITRITE NEGATIVE 02/12/2019 1351   LEUKOCYTESUR NEGATIVE 02/12/2019 1351   Sepsis Labs: @LABRCNTIP (procalcitonin:4,lacticidven:4)  ) Recent Results (from the past 240 hour(s))  Novel Coronavirus,NAA,(SEND-OUT TO REF LAB - TAT 24-48 hrs); Hosp Order     Status: None   Collection Time: 02/12/19  1:28 PM   Specimen: Nasopharyngeal Swab; Respiratory  Result Value Ref Range Status   SARS-CoV-2, NAA NOT DETECTED NOT DETECTED Final    Comment: (NOTE) This test was developed and its performance characteristics determined by [employer]. This test has not been FDA cleared or approved. This test has been authorized by FDA under an Emergency Use Authorization (EUA). This test  is only authorized for the duration of time the declaration that circumstances exist justifying the authorization of the emergency use of in vitro diagnostic tests for detection of SARS-CoV-2 virus and/or diagnosis of COVID-19 infection under section 564(b)(1) of the Act, 21 U.S.C. 423NTI-1(W)(4), unless the authorization is terminated or revoked sooner. When diagnostic testing is negative, the possibility of a false negative result should be considered in the context of a patient's recent exposures and the presence of clinical signs and symptoms consistent with COVID-19. An individual without symptoms of COVID-19 and who is not shedding SARS-CoV-2 virus would expect to have a negative (not detected) result in this assay. Performed  At: [facility] [place], [place] [id number] Rush Farmer MD YP:[id number]    [place]  Final    Comment: Performed at [facility] [address] [place], [place] [address]      Studies: No results found.  Scheduled Meds: . dexamethasone  4 mg Intravenous Q6H  . enoxaparin (LOVENOX) injection  40 mg Subcutaneous Q24H  . feeding supplement (ENSURE ENLIVE)  237 mL Oral BID BM  . fluticasone  2 spray Each Nare Daily  . Glycopyrrolate-Formoterol  2 puff Inhalation BID  . loratadine  10 mg Oral Daily    Continuous Infusions: . lactated ringers Stopped (02/14/19 2228)     LOS: 3 days     Annita Brod, MD [facility]  To reach me or the doctor on call, go to: [email or web address] Password [facility]  02/15/2019, 4:29 PM

## 2019-02-16 ENCOUNTER — Inpatient Hospital Stay (HOSPITAL_COMMUNITY): Payer: Medicare Other

## 2019-02-16 ENCOUNTER — Other Ambulatory Visit: Payer: Self-pay | Admitting: Student

## 2019-02-16 ENCOUNTER — Encounter (HOSPITAL_COMMUNITY): Payer: Self-pay | Admitting: Radiology

## 2019-02-16 LAB — BASIC METABOLIC PANEL
Anion gap: 11 (ref 5–15)
BUN: 7 mg/dL — ABNORMAL LOW (ref 8–23)
CO2: 25 mmol/L (ref 22–32)
Calcium: 9.5 mg/dL (ref 8.9–10.3)
Chloride: 108 mmol/L (ref 98–111)
Creatinine, Ser: 0.47 mg/dL (ref 0.44–1.00)
GFR calc Af Amer: >60 mL/min (ref >60)
GFR calc non Af Amer: >60 mL/min (ref >60)
Glucose, Bld: 127 mg/dL — ABNORMAL HIGH (ref 70–99)
Potassium: 3.8 mmol/L (ref 3.5–5.1)
Sodium: 144 mmol/L (ref 135–145)

## 2019-02-16 LAB — CBC
HCT: 37.3 % (ref 36.0–46.0)
Hemoglobin: 12 g/dL (ref 12.0–15.0)
MCH: 31.3 pg (ref 26.0–34.0)
MCHC: 32.2 g/dL (ref 30.0–36.0)
MCV: 97.1 fL (ref 80.0–100.0)
Platelets: 331 10*3/uL (ref 150–400)
RBC: 3.84 MIL/uL — ABNORMAL LOW (ref 3.87–5.11)
RDW: 14.9 % (ref 11.5–15.5)
WBC: 9.1 10*3/uL (ref 4.0–10.5)
nRBC: 0 % (ref 0.0–0.2)

## 2019-02-16 MED ORDER — DEXAMETHASONE 4 MG PO TABS
4.0000 mg | ORAL_TABLET | Freq: Four times a day (QID) | ORAL | Status: DC
  Administered 2019-02-16: 4 mg via ORAL
  Filled 2019-02-16: qty 1

## 2019-02-16 MED ORDER — FENTANYL CITRATE (PF) 100 MCG/2ML IJ SOLN
INTRAMUSCULAR | Status: AC
  Filled 2019-02-16: qty 4

## 2019-02-16 MED ORDER — ENOXAPARIN SODIUM 40 MG/0.4ML ~~LOC~~ SOLN: 40.0000 mg | SUBCUTANEOUS | Status: DC

## 2019-02-16 MED ORDER — DEXAMETHASONE 4 MG PO TABS: 4.0000 mg | ORAL_TABLET | Freq: Four times a day (QID) | ORAL | 0 refills | Status: AC

## 2019-02-16 MED ORDER — FLUMAZENIL 0.5 MG/5ML IV SOLN
INTRAVENOUS | Status: AC
  Filled 2019-02-16: qty 5

## 2019-02-16 MED ORDER — MIDAZOLAM HCL 2 MG/2ML IJ SOLN
INTRAMUSCULAR | Status: AC
  Filled 2019-02-16: qty 4

## 2019-02-16 MED ORDER — MIDAZOLAM HCL 2 MG/2ML IJ SOLN
INTRAMUSCULAR | Status: AC | PRN
  Administered 2019-02-16 (×2): 1 mg via INTRAVENOUS

## 2019-02-16 MED ORDER — LIDOCAINE HCL (PF) 1 % IJ SOLN
INTRAMUSCULAR | Status: AC | PRN
  Administered 2019-02-16: 10 mL

## 2019-02-16 MED ORDER — NALOXONE HCL 0.4 MG/ML IJ SOLN
INTRAMUSCULAR | Status: AC
  Filled 2019-02-16: qty 1

## 2019-02-16 MED ORDER — FENTANYL CITRATE (PF) 100 MCG/2ML IJ SOLN
INTRAMUSCULAR | Status: AC | PRN
  Administered 2019-02-16 (×2): 50 ug via INTRAVENOUS

## 2019-02-16 NOTE — Progress Notes (Signed)
DC packet and instructions were gone over with the pt. Pt. Verbalized an understanding of all instructions and had no further questions. IV was removed and WDL. Pt medications were picked up at the pharmacy and pt [place] via wheel chair to son-n-law.

## 2019-02-16 NOTE — Discharge Summary (Addendum)
Physician Discharge Summary  Pamela Russell STM:[id number] DOB: [birth year] DOA: 02/12/2019  PCP: [place] Bing, DO  Admit date: 02/12/2019 Discharge date: 02/16/2019  Admitted From: home Disposition:  home  Recommendations for Outpatient Follow-up:  1. Follow up with PCP in 1-2 weeks 2. Please follow-up with Dr. Julien Nordmann and your radiation oncologist for biopsy results. 3. Please obtain BMP/CBC in one week 4. Please follow up on the following pending results:  Home Health: no  Equipment/Devices: none  Discharge Condition:stable  CODE STATUS: DNR  Diet recommendation: regular  Brief/Interim Summary:  73 y.o.year old femalewith medical history significant for hypertension, hyperlipidemia, Copd, non-small cell lung cancer, s/p XRTx5who presented on6/25/2020with one week complaint of inability to open up left eye on her own with double visionand was found to haveacute on chronic severe hyponatremia and brain mass concerning for metastatic lesion.  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  Patient is being set up for retroperitoneal lymph node biopsy on Monday, 6/29.Patient sodium has since improved, increasing to 136 by 6/27. she has no complaints and is feeling well.  Patient feels her vision is improving today.  She underwent successful retroperitoneal lymph node biopsy requesting to for home today and she will follow-up with her oncologist in radiation oncologist. Plan of care was discussed with patient's daughter over the phone regarding follow-up. Also discussed with her oncology Dr. Julien Nordmann and okay for discharge.  Assessment/Plan: Malignant neoplasm of right upper lobe of lung: Seen by oncology here and plans to follow-up as outpatient once lymph node biopsy is back.  Patient is aware about follow-up recommendation.  Also has follow-up with radiation oncology who will be monitoring her biopsy   Hyponatremia: Since resolved.   Unclear etiology.  Dr [place] Pink discussed by phone with nephrology and given concerns for possible metastases, SIADH could not be ruled out.  Appears euvolemic for now.  Essential hypertension: Somewhat higher over the last few days.  Will give PRN hydralazine.  Looking at her previous echo in 2019, she has some possible mild diastolic dysfunction.  Will check a BNP  Hypokalemia: Stable.  Replaced.  Ptosis with intermittent episodes of diplopia: Unclear etiology.   ?From pituitary adenoma  Brain mass: Oncology reviewed MRI.  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 willl go home on po decadron as per Dr Earlie Server  and oncology will taper as oputpatient  Leukocytosis: Likely secondary to steroids.  Patient afebrile.  Suspect protein calorie malnutrition: Consult dietary  Code Status: DNR  Family Communication:  Discussed with daughter regarding disposition plan.  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  Radiation oncology is following the patient. 3.Retroperitoneal lymphadenopathy: Biopsy being planned for Monday. From medical oncology standpoint there is no further recommendations for the weekend. We will await the results of the retroperitoneal lymph node biopsy.  She will be followed by Dr. Julien Nordmann as outpatient.   Discharge Diagnoses:  Active Problems:   Primary hypertension   Chronic obstructive pulmonary disease (COPD) (HCC)   Tobacco use disorder   Malignant neoplasm of right upper lobe of lung (HCC)   Diplopia   Hyponatremia   Hypokalemia   Ptosis   Brain mass   Leukocytosis    Discharge Instructions  Discharge Instructions    Diet - low sodium heart healthy  Complete by: As directed    Discharge instructions    Complete by: As directed    Please call call MD or return to ER for similar or worsening recurring problem that brought you to hospital or if any fever,nausea/vomiting,abdominal pain, uncontrolled pain, chest pain,  shortness of breath or any other alarming symptoms.  Please follow-up your doctor as instructed in a week time and call the office for appointment.   You were cared for by a hospitalist during your hospital stay. If you have any questions about your discharge medications or the care you received while you were in the hospital after you are discharged, you can call the unit and asked to speak with the hospitalist on call if the hospitalist that took care of you is not available. Once you are discharged, your primary care physician will handle any further medical issues. Please note that NO REFILLS for any discharge medications will be authorized once you are discharged, as it is imperative that you return to your primary care physician (or establish a relationship with a primary care physician if you do not have one) for your aftercare needs so that they can reassess your need for medications and monitor your lab values   Increase activity slowly   Complete by: As directed      Allergies as of 02/16/2019      Reactions   Codeine Anaphylaxis, Swelling   Penicillins Anaphylaxis, Swelling   Childhood reaction. Has patient had a PCN reaction causing immediate rash, facial/tongue/throat swelling, SOB or lightheadedness with hypotension: Yes Has patient had a PCN reaction causing severe rash involving mucus membranes or skin necrosis: No Has patient had a PCN reaction that required hospitalization: No Has patient had a PCN reaction occurring within the last 10 years: No If all of the above answers are "NO", then may proceed with Cephalosporin use.   Anoro Ellipta [umeclidinium-vilanterol] Nausea And Vomiting   Severe       Medication List    STOP taking these medications    amLODipine 5 MG tablet Commonly known as: NORVASC   hydrochlorothiazide 25 MG tablet Commonly known as: HYDRODIURIL   predniSONE 10 MG tablet Commonly known as: DELTASONE     TAKE these medications   albuterol (2.5 MG/3ML) 0.083% nebulizer solution Commonly known as: PROVENTIL Take 2.5 mg by nebulization daily as needed for wheezing or shortness of breath.   albuterol 108 (90 Base) MCG/ACT inhaler Commonly known as: ProAir HFA Inhale 2 puffs into the lungs every 6 (six) hours as needed for wheezing or shortness of breath.   aspirin EC 81 MG tablet Take 81 mg by mouth daily.   cetirizine 10 MG tablet Commonly known as: ZYRTEC Take 10 mg by mouth daily.   dexamethasone 4 MG tablet Commonly known as: DECADRON Take 1 tablet (4 mg total) by mouth every 6 (six) hours for 30 days.   fluticasone 50 MCG/ACT nasal spray Commonly known as: FLONASE Place 2 sprays into both nostrils daily. What changed: when to take this   glucosamine-chondroitin 500-400 MG tablet Take 1 tablet by mouth daily.   Glycopyrrolate-Formoterol 9-4.8 MCG/ACT Aero Commonly known as: Airline pilot Inhale 2 puffs into the lungs 2 (two) times daily.   mometasone 0.1 % lotion Commonly known as: ELOCON Apply 1 application topically daily.   multivitamin with minerals Tabs tablet Take 1 tablet by mouth daily.      Follow-up Information    [place] Bing, DO Follow up in 1 week(s).  Specialty: Family Medicine Contact information: [place] [address] [phone]        Josue Hector, MD .   Specialty: Cardiology Contact information: [phone] [place] [address] [phone]        Curt Bears, MD Follow up in 1 week(s).   Specialty: Oncology Contact information: [address] [place] [address] [phone]          Allergies  Allergen Reactions  . Codeine Anaphylaxis and Swelling  . Penicillins  Anaphylaxis and Swelling    Childhood reaction. Has patient had a PCN reaction causing immediate rash, facial/tongue/throat swelling, SOB or lightheadedness with hypotension: Yes Has patient had a PCN reaction causing severe rash involving mucus membranes or skin necrosis: No Has patient had a PCN reaction that required hospitalization: No Has patient had a PCN reaction occurring within the last 10 years: No If all of the above answers are "NO", then may proceed with Cephalosporin use.   Celedonio Miyamoto [Umeclidinium-Vilanterol] Nausea And Vomiting    Severe    Procedures/Studies: Dg Chest 2 View  Result Date: 01/21/2019 CLINICAL DATA:  Shortness of breath. EXAM: CHEST - 2 VIEW COMPARISON:  Radiographs of October 21, 2018.  CT scan of Jan 16, 2019. FINDINGS: The heart size and mediastinal contours are within normal limits. Both lungs are clear. Atherosclerosis of thoracic aorta is noted. No pneumothorax or pleural effusion is noted. Hyperexpansion of the lungs is noted. The visualized skeletal structures are unremarkable. IMPRESSION: No acute cardiopulmonary abnormality seen. Hyperexpansion of the lungs is noted consistent with chronic obstructive pulmonary disease. Aortic Atherosclerosis (ICD10-I70.0). Electronically Signed   By: Marijo Conception M.D.   On: 01/21/2019 13:36   Mr Brain Wo Contrast  Result Date: 02/12/2019 CLINICAL DATA:  73 year old female with left 3rd nerve palsy for 6 days. History of lung cancer treated earlier this year. EXAM: MRI HEAD WITHOUT CONTRAST TECHNIQUE: Multiplanar, multiecho pulse sequences of the brain and surrounding structures were obtained without intravenous contrast. COMPARISON:  None. FINDINGS: Study is intermittently degraded by motion artifact despite repeated imaging attempts. Brain: There is a small focus of restricted diffusion in the inferior left occipital lobe white matter best seen on series 11, image 11. However, no brainstem diffusion abnormality is  identified. Faint if any associated T2 and FLAIR hyperintensity in the left occipital lobe white matter. No associated hemorrhage or mass effect. However, the right temporal lobe is abnormal, with mass-like soft tissue thickening and architectural distortion most apparent on series 12, image 15 and series 5, image 10. There is associated mass effect on the right temporal horn, although no ventriculomegaly. However, the abnormal parenchyma appears largely isointense to other gray and white matter signal. It is unclear whether the some of the right insula might also be affected and abnormally thickened (series 10, image 50). No midline shift. Basilar cisterns remain normal. The deep gray matter nuclei brainstem and cerebellum appear normal. No cortical encephalomalacia or chronic cerebral blood products. Cervicomedullary junction within normal limits. Vascular: Partial loss of the distal left vertebral artery flow void (series 5, image 2). Other Major intracranial vascular flow voids are preserved. Skull and upper cervical spine: Negative visible cervical spine. Visualized bone marrow signal is within normal limits. Sinuses/Orbits: The pituitary gland also appears at the upper limits of normal, but the suprasellar cistern is preserved. Normal noncontrast appearance of the cavernous sinuses. Postoperative changes to both globes, the intraorbital soft tissues appear normal. Paranasal sinuses  are clear. Other: Mastoid air cells are clear. Visible internal auditory structures appear normal. Scalp and face soft tissues appear negative. IMPRESSION: 1. Intermittently motion degraded study despite premedication and repeated imaging attempts. 2. The dominant abnormality is mass-like enlargement of the right temporal lobe, although the affected area appears largely isointense to normal brain parenchyma. The right insula and perhaps also the pituitary gland may be affected. Main differential considerations at this point are an  infiltrative tumor, and cortical dysplasia (although the latter would seem unlikely if there is no chronic seizure history). Recommend follow-up Brain MRI with sedation (or anesthesia), done without and with IV contrast. 3. 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 No brainstem ischemia or abnormality identified. 4. 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 Electronically Signed   By: Genevie Ann M.D.   On: 02/12/2019 15:13   Mr Jeri Cos XI Contrast  Result Date: 02/13/2019 CLINICAL DATA:  Left ptosis and diplopia. History of non-small cell lung cancer with abnormal appearance of the right temporal lobe on a motion degraded noncontrast MRI yesterday. EXAM: MRI HEAD WITHOUT AND WITH CONTRAST TECHNIQUE: Multiplanar, multiecho pulse sequences of the brain and surrounding structures were obtained without and with intravenous contrast. CONTRAST:  5 mL Gadavist COMPARISON:  Noncontrast brain MRI 02/12/2019 FINDINGS: Brain: Abnormal appearance of the right temporal lobe described on yesterday's MRI is nonenhancing and on today's less motion degraded examination reflects extensive subcortical heterotopia throughout the mesial temporal lobe with dysmorphic right temporal horn. There is a 6 mm focus of ring enhancement in the left occipital lobe which corresponds to the restricted diffusion seen on yesterday's study without significant vasogenic edema. No second enhancing brain lesion is identified. However, there is diffuse enlargement and heterogeneous enhancement of the pituitary gland which measures 12 mm in height and is contiguous with heterogeneously hypoenhancing tissue extending into the left cavernous sinus. The pituitary bulges into the suprasellar cistern, however there is no mass effect on the optic chiasm. No acute infarct, intracranial hemorrhage, midline shift, or extra-axial fluid collection is identified. A few scattered small foci of T2 hyperintensity in the cerebral white matter  are nonspecific though may reflect minimal chronic small vessel ischemic disease. There is a small chronic cortical infarct posteriorly in the left frontal lobe. There is mild cerebral atrophy. Vascular: Major intracranial vascular flow voids are preserved. Skull and upper cervical spine: Unremarkable bone marrow signal. Sinuses/Orbits: Bilateral cataract extraction. Paranasal sinuses and mastoid air cells are clear. Other: None. IMPRESSION: 1. 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 2. 6 mm ring-enhancing lesion in the left occipital lobe with considerations including subacute infarct and metastasis. Consider short-term follow-up MRI in 6 weeks. 3. 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 Electronically Signed   By: Logan Bores M.D.   On: 02/13/2019 16:22   Ct Abdomen Pelvis W Contrast  Result Date: 01/28/2019 CLINICAL DATA:  Right upper lobe lung adenocarcinoma and putatively non-small cell left upper lobe lung cancer status post bilateral radiation therapy completed 09/12/2018. Extensive new mediastinal and partially visualized upper abdominal adenopathy on chest CT study. Patient presents for completion of restaging evaluation. EXAM: CT ABDOMEN AND PELVIS WITH CONTRAST TECHNIQUE: Multidetector CT imaging of the abdomen and pelvis was performed using the standard protocol following bolus administration of intravenous contrast. CONTRAST:  164mL OMNIPAQUE IOHEXOL 300 MG/ML  SOLN COMPARISON:  01/16/2027 chest CT.  06/24/2018 PET-CT. FINDINGS: Lower chest: No  acute abnormality at the lung bases. Coronary atherosclerosis. Multiple enlarged heterogeneously enhancing bilateral posterior lower mediastinal lymph nodes, largest 2.4 cm on the right (series 2/image 12), stable since recent 01/16/2019 chest CT, all new since 06/24/2018 PET-CT. Hepatobiliary: Normal liver size. No liver mass. Normal gallbladder with  no radiopaque cholelithiasis. No biliary ductal dilatation. Pancreas: Normal, with no mass or duct dilation. Spleen: Normal size. No mass. Adrenals/Urinary Tract: Left adrenal 1.6 cm nodule with density 79 HU, new since 06/24/2018 PET-CT. No right adrenal nodules. Mild right hydroureteronephrosis to the level of the right ureterovesical junction without obstructing stone or discrete obstructing mass. No left hydronephrosis. Contrast nephrograms are symmetric and normal. No renal masses. Mildly distended and otherwise normal bladder. Stomach/Bowel: Normal non-distended stomach. Normal caliber small bowel with no small bowel wall thickening. Normal appendix. Oral contrast transits to the distal colon. Moderate sigmoid diverticulosis, with no large bowel wall thickening or significant pericolonic fat stranding. Vascular/Lymphatic: Atherosclerotic nonaneurysmal abdominal aorta. Patent portal, splenic, hepatic and renal veins. There is extensive confluent adenopathy throughout the gastrohepatic ligament, porta hepatis, portacaval, aortocaval, left para-aortic and left common and external iliac nodal chains, demonstrating heterogeneous enhancement, all new since 06/24/2018 PET-CT. Representative 2.9 cm gastrohepatic ligament node (series 2/image 21), which may directly invade the pancreatic body. Representative 2.3 cm porta hepatis node (series 2/image 20). Representative 2.2 cm aortocaval node (series 2/image 29). Representative 2.2 cm left para-aortic node (series 2/image 31). Representative 1.5 cm left common iliac node (series 2/image 42). Representative 1.5 cm left external iliac node (series 2/image 54). Reproductive: Status post hysterectomy, with no abnormal findings at the vaginal cuff. No adnexal mass. Other: No pneumoperitoneum, ascites or focal fluid collection. Musculoskeletal: No aggressive appearing focal osseous lesions. Moderate lumbar spondylosis. IMPRESSION: 1. Extensive confluent adenopathy throughout  the posterior lower mediastinum, retroperitoneum and left pelvis, all new since 06/24/2018 PET-CT, compatible with metastatic disease. The heterogeneous enhancement within these nodes is more typical of metastatic carcinoma than lymphoma. 2. New left adrenal metastasis. 3. New mild right hydroureteronephrosis without discrete obstructing stone or mass. Contrast nephrograms are symmetric and normal, suggesting absence of high-grade obstruction. 4.  Aortic Atherosclerosis (ICD10-I70.0). Electronically Signed   By: Ilona Sorrel M.D.   On: 01/28/2019 17:29    Subjective: Resting well, seen this morning prior to the biopsy.  She reports her vision is getting betterand able to open her eyes more.  Also discussed post biopsy, was having her meal and feeling great and wanting to go home. Discharge Exam: Vitals:   02/16/19 1227 02/16/19 1258  BP: (!) 129/104 (!) 148/81  Pulse: (!) 102 85  Resp: 20 20  Temp: 97.9 F (36.6 C) 97.6 F (36.4 C)  SpO2: 98%    Vitals:   02/16/19 1200 02/16/19 1205 02/16/19 1227 02/16/19 1258  BP: 139/73 134/74 (!) 129/104 (!) 148/81  Pulse: 94 98 (!) 102 85  Resp: 10 14 20 20   Temp:   97.9 F (36.6 C) 97.6 F (36.4 C)  TempSrc:   Oral Oral  SpO2: 99% 99% 98%   Weight:      Height:        General: Pt is alert, awake, not in acute distress Cardiovascular: RRR, S1/S2 +, no rubs, no gallops Respiratory: CTA bilaterally, no wheezing, no rhonchi Abdominal: Soft, NT, ND, bowel sounds + Extremities: no edema, no cyanosis   The results of significant diagnostics from this hospitalization (including imaging, microbiology, ancillary and laboratory) are listed below for reference.     Microbiology:  Recent Results (from the past 240 hour(s))  Novel Coronavirus,NAA,(SEND-OUT TO REF LAB - TAT 24-48 hrs); Hosp Order     Status: None   Collection Time: 02/12/19  1:28 PM   Specimen: Nasopharyngeal Swab; Respiratory  Result Value Ref Range Status   SARS-CoV-2, NAA NOT  DETECTED NOT DETECTED Final    Comment: (NOTE) This test was developed and its performance characteristics determined by [employer]. This test has not been FDA cleared or approved. This test has been authorized by FDA under an Emergency Use Authorization (EUA). This test is only authorized for the duration of time the declaration that circumstances exist justifying the authorization of the emergency use of in vitro diagnostic tests for detection of SARS-CoV-2 virus and/or diagnosis of COVID-19 infection under section 564(b)(1) of the Act, 21 U.S.C. 449EEF-0(O)(7), unless the authorization is terminated or revoked sooner. When diagnostic testing is negative, the possibility of a false negative result should be considered in the context of a patient's recent exposures and the presence of clinical signs and symptoms consistent with COVID-19. An individual without symptoms of COVID-19 and who is not shedding SARS-CoV-2 virus would expect to have a negative (not detected) result in this assay. Performed  At: [facility] [place], [place] [id number] Rush Farmer MD GP:[id number]    [place]  Final    Comment: Performed at [facility] [address] [place], [place] [address]     Labs: BNP (last 3 results) Recent Labs    02/15/19 1711  BNP 680.8*   Basic Metabolic Panel: Recent Labs  Lab 02/12/19 1351  02/12/19 2239 02/13/19 0424 02/13/19 1040 02/14/19 0444 02/16/19 0500  NA 118*   < > 122* 128* 130* 136 144  K 3.0*   < > 3.6 4.5 4.1 4.6 3.8  CL 81*   < > 88* 92* 94* 101 108  CO2 27  --  21* 19* 21* 24 25  GLUCOSE 90   < > 101* 142* 166* 139* 127*  BUN 7*   < > <5* 5* 5* 7* 7*  CREATININE 0.31*   < > <0.30* 0.44 0.45 0.48 0.47  CALCIUM 8.7*  --  8.4* 9.3 9.3 9.3 9.5  MG 1.7  --   --  2.2  --   --   --    < > = values in this interval not displayed.   Liver Function Tests: Recent Labs  Lab  02/12/19 1351 02/13/19 0424  AST 58* 40  ALT 11 11  ALKPHOS 71 65  BILITOT 0.7 0.4  PROT 7.0 6.8  ALBUMIN 3.8 3.6   No results for input(s): LIPASE, AMYLASE in the last 168 hours. No results for input(s): AMMONIA in the last 168 hours. CBC: Recent Labs  Lab 02/12/19 1351 02/12/19 1405 02/13/19 0424 02/14/19 0444 02/16/19 0500  WBC 6.4  --  4.6 13.1* 9.1  NEUTROABS 4.2  --   --   --   --   HGB 13.9 14.6 14.3 11.7* 12.0  HCT 38.7 43.0 40.7 34.6* 37.3  MCV 89.4  --  91.3 93.8 97.1  PLT 282  --  284 319 331   Cardiac Enzymes: No results for input(s): CKTOTAL, CKMB, CKMBINDEX, TROPONINI in the last 168 hours. BNP: Invalid input(s): POCBNP CBG: No results for input(s): GLUCAP in the last 168 hours. D-Dimer No results for input(s): DDIMER in the last 72 hours. Hgb A1c No results for input(s): HGBA1C in the last 72 hours.  Lipid Profile No results for input(s): CHOL, HDL, LDLCALC, TRIG, CHOLHDL, LDLDIRECT in the last 72 hours. Thyroid function studies No results for input(s): TSH, T4TOTAL, T3FREE, THYROIDAB in the last 72 hours.  Invalid input(s): FREET3 Anemia work up No results for input(s): VITAMINB12, FOLATE, FERRITIN, TIBC, IRON, RETICCTPCT in the last 72 hours. Urinalysis    Component Value Date/Time   COLORURINE YELLOW 02/12/2019 1351   APPEARANCEUR CLEAR 02/12/2019 1351   LABSPEC 1.004 (L) 02/12/2019 1351   PHURINE 6.0 02/12/2019 1351   GLUCOSEU NEGATIVE 02/12/2019 1351   HGBUR NEGATIVE 02/12/2019 1351   BILIRUBINUR NEGATIVE 02/12/2019 1351   KETONESUR 20 (A) 02/12/2019 1351   PROTEINUR NEGATIVE 02/12/2019 1351   NITRITE NEGATIVE 02/12/2019 1351   LEUKOCYTESUR NEGATIVE 02/12/2019 1351   Sepsis Labs Invalid input(s): PROCALCITONIN,  WBC,  LACTICIDVEN Microbiology Recent Results (from the past 240 hour(s))  Novel Coronavirus,NAA,(SEND-OUT TO REF LAB - TAT 24-48 hrs); Hosp Order     Status: None   Collection Time: 02/12/19  1:28 PM   Specimen:  Nasopharyngeal Swab; Respiratory  Result Value Ref Range Status   SARS-CoV-2, NAA NOT DETECTED NOT DETECTED Final    Comment: (NOTE) This test was developed and its performance characteristics determined by [employer]. This test has not been FDA cleared or approved. This test has been authorized by FDA under an Emergency Use Authorization (EUA). This test is only authorized for the duration of time the declaration that circumstances exist justifying the authorization of the emergency use of in vitro diagnostic tests for detection of SARS-CoV-2 virus and/or diagnosis of COVID-19 infection under section 564(b)(1) of the Act, 21 U.S.C. 638GYK-5(L)(9), unless the authorization is terminated or revoked sooner. When diagnostic testing is negative, the possibility of a false negative result should be considered in the context of a patient's recent exposures and the presence of clinical signs and symptoms consistent with COVID-19. An individual without symptoms of COVID-19 and who is not shedding SARS-CoV-2 virus would expect to have a negative (not detected) result in this assay. Performed  At: [facility] [place], [place] [id number] Rush Farmer MD JQ:[id number]    [place]  Final    Comment: Performed at [place] [address] [place], [place] [address]     Time coordinating discharge: 25 minutes  SIGNED:   Antonieta Pert, MD  [facility] 02/16/2019, 1:37 PM  If 7PM-7AM, please contact night-coverage [email or web address]

## 2019-02-16 NOTE — Procedures (Signed)
Pre-procedure Diagnosis: Retroperitoneal Lymphadenopathy Post-procedure Diagnosis: Same  Technically successful CT guided biopsy of dominant left sided retroperitoneal nodal mass.   Complications: None Immediate EBL: None  Signed: Sandi Mariscal Pager: [phone] 02/16/2019, 12:14 PM

## 2019-02-16 NOTE — Care Management Important Message (Signed)
Important Message  Patient Details IM Letter given to [name] RN to present to the Patient Name: Pamela Russell MRN: [id number] Date of Birth: 07-13-1946   Medicare Important Message Given:  Yes     Kerin Salen 02/16/2019, 12:17 PM

## 2019-02-16 NOTE — Progress Notes (Signed)
Initial Nutrition Assessment  RD working remotely.   DOCUMENTATION CODES:   Underweight  INTERVENTION:  - continue Ensure Enlive BID, each supplement provides 350 kcal and 20 grams of protein. - continue to encourage PO intakes.    NUTRITION DIAGNOSIS:   Increased nutrient needs related to chronic illness, catabolic illness, cancer and cancer related treatments as evidenced by estimated needs.  GOAL:   Patient will meet greater than or equal to 90% of their needs, Weight gain  MONITOR:   PO intake, Supplement acceptance, Labs, Weight trends  REASON FOR ASSESSMENT:   Consult Assessment of nutrition requirement/status  ASSESSMENT:   73 y.o. year old female with medical history significant for HTN, hyperlipidemia, COPD, NSCLC s/p XRT x5. She presented to the ED on 6/25 with one week complaint of inability to open up left eye on her own, double vision, and was found to have acute on chronic severe hyponatremia and brain mass concerning for metastatic lesion. She underwent retroperitoneal lymph node biopsy on 6/29. Serum Na has been improving since admission.  Current weight is 105 lb and per chart review weight has been stable (103-108 lb) since 07/09/18. Patient denies any changes in appetite or intakes PTA. She denies any difficulties with eating recently, even following radiation treatments. She does not feel that her weight has changed recently. She reports that she was drinking Ensure supplements PTA. Per review of orders, Ensure Enlive was ordered BID and patient has accepted all 3 bottles offered to her. She did not follow with a RD PTA.   Patient underwent biopsy earlier this afternoon by IR. Reviewed Oncology note from earlier today which indicates that patient's case will be discussed at the brain oncology conference next Monday (7/6) and that at that time further recommendations for treatment (such as surgery, radiation, or endocrinology referral) will be made.    Discharge order for discharge to home entered at 1:37 PM. Discharge summary not yet entered.    Medications reviewed. Labs reviewed; BUN: 7 mg/dl.      NUTRITION - FOCUSED PHYSICAL EXAM:  unable to complete at this time.   Diet Order:   Diet Order            Diet regular Room service appropriate? Yes; Fluid consistency: Thin  Diet effective now        Diet - low sodium heart healthy              EDUCATION NEEDS:   No education needs have been identified at this time  Skin:  Skin Assessment: Reviewed RN Assessment  Last BM:  6/24  Height:   Ht Readings from Last 1 Encounters:  02/13/19 5' 4.02" (1.626 m)    Weight:   Wt Readings from Last 1 Encounters:  02/13/19 47.8 kg    Ideal Body Weight:  54.5 kg  BMI:  Body mass index is 18.08 kg/m.  Estimated Nutritional Needs:   Kcal:  1675-1915 kcal (35-40 kcal/kg)  Protein:  70-85 grams  Fluid:  >/= 1.8 L/day      Jarome Matin, MS, RD, LDN, [facility] Inpatient Clinical Dietitian Pager # [phone] After hours/weekend pager # [phone]

## 2019-02-16 NOTE — Consult Note (Signed)
Chief Complaint: Patient was seen in consultation today for CT guided retroperitoneal lymph node biopsy Chief Complaint  Patient presents with   Eye Problem    Referring Physician(s): [place]  Supervising Physician: Sandi Mariscal  Patient Status: [facility] - In-pt  History of Present Illness: Pamela Russell is a 73 y.o. female, former smoker, with prior history of stage I lung cancer treated with radiation therapy.  She was recently admitted with 1 week history of left eyelid drooping and double vision.  Subsequent imaging revealed pituitary adenoma and a 6 mm lesion which could be an infarct vs met.  Also present was extensive adenopathy throughout the posterior lower mediastinum, retroperitoneum and left pelvis compatible with metastatic disease.  There was also a new left adrenal metastasis as well as new mild right hydroureteronephrosis.  Request now received for CT-guided retroperitoneal lymph node biopsy for further evaluation.  Past Medical History:  Diagnosis Date   Allergic rhinitis    Asthma 2008   Chronic obstructive pulmonary disease (COPD) ([place]) 06/08/2016   Meds: Spiriva, albuterol inhaler PFT (11/29/2016): Moderate-severe obstruction by PFT 11/2016   Ear itching 07/05/2017   Grieving 06/08/2016   History of pneumonia    x4   Hyperlipidemia 09/10/2016   On atorvastatin 40 mg QD, ASA 81 mg QD ASCVD risk 17.1%, mod-high intensity statin Risk factors: FHx sig for MI: mother 36s-50s, and father age 59, HTN, smoker   Hypertension    Night sweats 06/08/2016   NSCL Ca dx'd 05/2018   xrt comp 09/2018   Pulmonary nodules 06/12/2017   Tobacco use disorder 07/06/2016   On nicotine replacement patch. Smoking cigs since age 61. Has 60 pack year history. Has COPD dx on SABA and LAMA.    Past Surgical History:  Procedure Laterality Date   CATARACT EXTRACTION Left 05/08/2016   CATARACT EXTRACTION Right 05/22/2016   RADIOLOGY WITH ANESTHESIA N/A 02/13/2019    Procedure: BRAIN MRI;  Surgeon: Radiologist, Medication, MD;  Location: [facility];  Service: Radiology;  Laterality: N/A;   TOTAL ABDOMINAL HYSTERECTOMY W/ BILATERAL SALPINGOOPHORECTOMY Bilateral 1975   VIDEO BRONCHOSCOPY WITH ENDOBRONCHIAL NAVIGATION Left 07/09/2018   Procedure: VIDEO BRONCHOSCOPY WITH ENDOBRONCHIAL NAVIGATION;  Surgeon: Collene Gobble, MD;  Location: [facility];  Service: Thoracic;  Laterality: Left;    Allergies: Codeine, Penicillins, and Anoro ellipta [umeclidinium-vilanterol]  Medications: Prior to Admission medications   Medication Sig Start Date End Date Taking? Authorizing Provider  albuterol (PROAIR HFA) 108 (90 Base) MCG/ACT inhaler Inhale 2 puffs into the lungs every 6 (six) hours as needed for wheezing or shortness of breath. 12/17/18  Yes Mannam, Praveen, MD  albuterol (PROVENTIL) (2.5 MG/3ML) 0.083% nebulizer solution Take 2.5 mg by nebulization daily as needed for wheezing or shortness of breath.   Yes [provider]  aspirin EC 81 MG tablet Take 81 mg by mouth daily.   Yes [provider]  cetirizine (ZYRTEC) 10 MG tablet Take 10 mg by mouth daily.   Yes [provider]  fluticasone (FLONASE) 50 MCG/ACT nasal spray Place 2 sprays into both nostrils daily. Patient taking differently: Place 2 sprays into both nostrils 2 (two) times a day.  02/11/19  Yes Patriciaann Clan, DO  glucosamine-chondroitin 500-400 MG tablet Take 1 tablet by mouth daily.    Yes [provider]  Glycopyrrolate-Formoterol (BEVESPI AEROSPHERE) 9-4.8 MCG/ACT AERO Inhale 2 puffs into the lungs 2 (two) times daily. 12/17/18  Yes Fenton Foy, NP  mometasone (ELOCON) 0.1 % lotion Apply 1  application topically daily. 01/08/19  Yes [place] Bing, DO  Multiple Vitamin (MULTIVITAMIN WITH MINERALS) TABS tablet Take 1 tablet by mouth daily.   Yes [provider]  amLODipine (NORVASC) 5 MG tablet TAKE 1 TABLET BY MOUTH  DAILY Patient not taking: No sig  reported 08/27/18   [place] Bing, DO  hydrochlorothiazide (HYDRODIURIL) 25 MG tablet TAKE 1 TABLET BY MOUTH  DAILY Patient not taking: No sig reported 08/27/18   [place] Bing, DO  predniSONE (DELTASONE) 10 MG tablet Take 72m daily x 5 days; 221mdaily x 5 days; 1064maily x 5 days; 5mg67mily x 5 days Patient not taking: Reported on 02/12/2019 01/21/19   WalsMartyn Ehrich     Family History  Problem Relation Age of Onset   Heart disease Mother        MI in her 50's49'stroke Mother    Heart disease Father        CABG   Hypertension Father    Heart disease Sister 58  45   CABG   Stroke Sister    Diabetes Sister     Social History   Socioeconomic History   Marital status: Widowed    Spouse name: Not on file   Number of children: 2   Years of education: Not on file   Highest education level: Not on file  Occupational History   Not on file  Social Needs   Financial resource strain: Not on file   Food insecurity    Worry: Not on file    Inability: Not on file   Transportation needs    Medical: Not on file    Non-medical: Not on file  Tobacco Use   Smoking status: Former Smoker    Packs/day: 1.00    Years: 60.00    Pack years: 60.00    Types: Cigarettes    Start date: 08/21/1955    Quit date: 01/28/2019    Years since quitting: 0.0   Smokeless tobacco: Never Used   Tobacco comment: 3 ppd for 20+ years, has cut down to 0.5 ppd, in the past 3 months, no cigs in 1 month  Substance and Sexual Activity   Alcohol use: Yes    Alcohol/week: 1.0 standard drinks    Types: 1 Cans of beer per week    Comment: occ   Drug use: No   Sexual activity: Not on file  Lifestyle   Physical activity    Days per week: Not on file    Minutes per session: Not on file   Stress: Not on file  Relationships   Social connections    Talks on phone: Not on file    Gets together: Not on file    Attends religious service: Not on file    Active member of club or  organization: Not on file    Attends meetings of clubs or organizations: Not on file    Relationship status: Not on file  Other Topics Concern   Not on file  Social History Narrative   Not on file      Review of Systems currently denies fever, headache, chest pain, dyspnea, abdominal/back pain, nausea, vomiting or bleeding.  She does have occasional cough, double vision in left eye as well as left ptosis  Vital Signs: BP (!) 154/92 (BP Location: Right Arm)    Pulse 86    Temp 99 F (37.2 C) (Oral)    Resp 14  Ht 5' 4.02" (1.626 m)    Wt 105 lb 6.1 oz (47.8 kg)    SpO2 95%    BMI 18.08 kg/m   Physical Exam awake, alert.  Left ptosis present with double vision.  Chest with distant breath sounds bilaterally.  Heart with regular rate and rhythm.  Abdomen soft, positive bowel sounds, nontender.  No lower extremity edema.  Imaging: Dg Chest 2 View  Result Date: 01/21/2019 CLINICAL DATA:  Shortness of breath. EXAM: CHEST - 2 VIEW COMPARISON:  Radiographs of October 21, 2018.  CT scan of Jan 16, 2019. FINDINGS: The heart size and mediastinal contours are within normal limits. Both lungs are clear. Atherosclerosis of thoracic aorta is noted. No pneumothorax or pleural effusion is noted. Hyperexpansion of the lungs is noted. The visualized skeletal structures are unremarkable. IMPRESSION: No acute cardiopulmonary abnormality seen. Hyperexpansion of the lungs is noted consistent with chronic obstructive pulmonary disease. Aortic Atherosclerosis (ICD10-I70.0). Electronically Signed   By: Marijo Conception M.D.   On: 01/21/2019 13:36   Mr Brain Wo Contrast  Result Date: 02/12/2019 CLINICAL DATA:  73 year old female with left 3rd nerve palsy for 6 days. History of lung cancer treated earlier this year. EXAM: MRI HEAD WITHOUT CONTRAST TECHNIQUE: Multiplanar, multiecho pulse sequences of the brain and surrounding structures were obtained without intravenous contrast. COMPARISON:  None. FINDINGS: Study is  intermittently degraded by motion artifact despite repeated imaging attempts. Brain: There is a small focus of restricted diffusion in the inferior left occipital lobe white matter best seen on series 11, image 11. However, no brainstem diffusion abnormality is identified. Faint if any associated T2 and FLAIR hyperintensity in the left occipital lobe white matter. No associated hemorrhage or mass effect. However, the right temporal lobe is abnormal, with mass-like soft tissue thickening and architectural distortion most apparent on series 12, image 15 and series 5, image 10. There is associated mass effect on the right temporal horn, although no ventriculomegaly. However, the abnormal parenchyma appears largely isointense to other gray and white matter signal. It is unclear whether the some of the right insula might also be affected and abnormally thickened (series 10, image 50). No midline shift. Basilar cisterns remain normal. The deep gray matter nuclei brainstem and cerebellum appear normal. No cortical encephalomalacia or chronic cerebral blood products. Cervicomedullary junction within normal limits. Vascular: Partial loss of the distal left vertebral artery flow void (series 5, image 2). Other Major intracranial vascular flow voids are preserved. Skull and upper cervical spine: Negative visible cervical spine. Visualized bone marrow signal is within normal limits. Sinuses/Orbits: The pituitary gland also appears at the upper limits of normal, but the suprasellar cistern is preserved. Normal noncontrast appearance of the cavernous sinuses. Postoperative changes to both globes, the intraorbital soft tissues appear normal. Paranasal sinuses are clear. Other: Mastoid air cells are clear. Visible internal auditory structures appear normal. Scalp and face soft tissues appear negative. IMPRESSION: 1. Intermittently motion degraded study despite premedication and repeated imaging attempts. 2. The dominant abnormality  is mass-like enlargement of the right temporal lobe, although the affected area appears largely isointense to normal brain parenchyma. The right insula and perhaps also the pituitary gland may be affected. Main differential considerations at this point are an infiltrative tumor, and cortical dysplasia (although the latter would seem unlikely if there is no chronic seizure history). Recommend follow-up Brain MRI with sedation (or anesthesia), done without and with IV contrast. 3. 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 No  brainstem ischemia or abnormality identified. 4. 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 Electronically Signed   By: Genevie Ann M.D.   On: 02/12/2019 15:13   Mr Jeri Cos FE Contrast  Result Date: 02/13/2019 CLINICAL DATA:  Left ptosis and diplopia. History of non-small cell lung cancer with abnormal appearance of the right temporal lobe on a motion degraded noncontrast MRI yesterday. EXAM: MRI HEAD WITHOUT AND WITH CONTRAST TECHNIQUE: Multiplanar, multiecho pulse sequences of the brain and surrounding structures were obtained without and with intravenous contrast. CONTRAST:  5 mL Gadavist COMPARISON:  Noncontrast brain MRI 02/12/2019 FINDINGS: Brain: Abnormal appearance of the right temporal lobe described on yesterday's MRI is nonenhancing and on today's less motion degraded examination reflects extensive subcortical heterotopia throughout the mesial temporal lobe with dysmorphic right temporal horn. There is a 6 mm focus of ring enhancement in the left occipital lobe which corresponds to the restricted diffusion seen on yesterday's study without significant vasogenic edema. No second enhancing brain lesion is identified. However, there is diffuse enlargement and heterogeneous enhancement of the pituitary gland which measures 12 mm in height and is contiguous with heterogeneously hypoenhancing tissue extending into the left cavernous sinus. The pituitary bulges into  the suprasellar cistern, however there is no mass effect on the optic chiasm. No acute infarct, intracranial hemorrhage, midline shift, or extra-axial fluid collection is identified. A few scattered small foci of T2 hyperintensity in the cerebral white matter are nonspecific though may reflect minimal chronic small vessel ischemic disease. There is a small chronic cortical infarct posteriorly in the left frontal lobe. There is mild cerebral atrophy. Vascular: Major intracranial vascular flow voids are preserved. Skull and upper cervical spine: Unremarkable bone marrow signal. Sinuses/Orbits: Bilateral cataract extraction. Paranasal sinuses and mastoid air cells are clear. Other: None. IMPRESSION: 1. 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 2. 6 mm ring-enhancing lesion in the left occipital lobe with considerations including subacute infarct and metastasis. Consider short-term follow-up MRI in 6 weeks. 3. 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 Electronically Signed   By: Logan Bores M.D.   On: 02/13/2019 16:22   Ct Abdomen Pelvis W Contrast  Result Date: 01/28/2019 CLINICAL DATA:  Right upper lobe lung adenocarcinoma and putatively non-small cell left upper lobe lung cancer status post bilateral radiation therapy completed 09/12/2018. Extensive new mediastinal and partially visualized upper abdominal adenopathy on chest CT study. Patient presents for completion of restaging evaluation. EXAM: CT ABDOMEN AND PELVIS WITH CONTRAST TECHNIQUE: Multidetector CT imaging of the abdomen and pelvis was performed using the standard protocol following bolus administration of intravenous contrast. CONTRAST:  155m OMNIPAQUE IOHEXOL 300 MG/ML  SOLN COMPARISON:  01/16/2027 chest CT.  06/24/2018 PET-CT. FINDINGS: Lower chest: No acute abnormality at the lung bases. Coronary atherosclerosis. Multiple enlarged  heterogeneously enhancing bilateral posterior lower mediastinal lymph nodes, largest 2.4 cm on the right (series 2/image 12), stable since recent 01/16/2019 chest CT, all new since 06/24/2018 PET-CT. Hepatobiliary: Normal liver size. No liver mass. Normal gallbladder with no radiopaque cholelithiasis. No biliary ductal dilatation. Pancreas: Normal, with no mass or duct dilation. Spleen: Normal size. No mass. Adrenals/Urinary Tract: Left adrenal 1.6 cm nodule with density 79 HU, new since 06/24/2018 PET-CT. No right adrenal nodules. Mild right hydroureteronephrosis to the level of the right ureterovesical junction without obstructing stone or discrete obstructing mass. No left hydronephrosis. Contrast nephrograms are symmetric and normal. No renal masses. Mildly distended and  otherwise normal bladder. Stomach/Bowel: Normal non-distended stomach. Normal caliber small bowel with no small bowel wall thickening. Normal appendix. Oral contrast transits to the distal colon. Moderate sigmoid diverticulosis, with no large bowel wall thickening or significant pericolonic fat stranding. Vascular/Lymphatic: Atherosclerotic nonaneurysmal abdominal aorta. Patent portal, splenic, hepatic and renal veins. There is extensive confluent adenopathy throughout the gastrohepatic ligament, porta hepatis, portacaval, aortocaval, left para-aortic and left common and external iliac nodal chains, demonstrating heterogeneous enhancement, all new since 06/24/2018 PET-CT. Representative 2.9 cm gastrohepatic ligament node (series 2/image 21), which may directly invade the pancreatic body. Representative 2.3 cm porta hepatis node (series 2/image 20). Representative 2.2 cm aortocaval node (series 2/image 29). Representative 2.2 cm left para-aortic node (series 2/image 31). Representative 1.5 cm left common iliac node (series 2/image 42). Representative 1.5 cm left external iliac node (series 2/image 54). Reproductive: Status post hysterectomy, with  no abnormal findings at the vaginal cuff. No adnexal mass. Other: No pneumoperitoneum, ascites or focal fluid collection. Musculoskeletal: No aggressive appearing focal osseous lesions. Moderate lumbar spondylosis. IMPRESSION: 1. Extensive confluent adenopathy throughout the posterior lower mediastinum, retroperitoneum and left pelvis, all new since 06/24/2018 PET-CT, compatible with metastatic disease. The heterogeneous enhancement within these nodes is more typical of metastatic carcinoma than lymphoma. 2. New left adrenal metastasis. 3. New mild right hydroureteronephrosis without discrete obstructing stone or mass. Contrast nephrograms are symmetric and normal, suggesting absence of high-grade obstruction. 4.  Aortic Atherosclerosis (ICD10-I70.0). Electronically Signed   By: Ilona Sorrel M.D.   On: 01/28/2019 17:29    Labs:  CBC: Recent Labs    02/12/19 1351 02/12/19 1405 02/13/19 0424 02/14/19 0444 02/16/19 0500  WBC 6.4  --  4.6 13.1* 9.1  HGB 13.9 14.6 14.3 11.7* 12.0  HCT 38.7 43.0 40.7 34.6* 37.3  PLT 282  --  284 319 331    COAGS: Recent Labs    07/08/18 1135 02/12/19 1351  INR 0.93 0.8  APTT 26 31    BMP: Recent Labs    02/13/19 0424 02/13/19 1040 02/14/19 0444 02/16/19 0500  NA 128* 130* 136 144  K 4.5 4.1 4.6 3.8  CL 92* 94* 101 108  CO2 19* 21* 24 25  GLUCOSE 142* 166* 139* 127*  BUN 5* 5* 7* 7*  CALCIUM 9.3 9.3 9.3 9.5  CREATININE 0.44 0.45 0.48 0.47  GFRNONAA >60 >60 >60 >60  GFRAA >60 >60 >60 >60    LIVER FUNCTION TESTS: Recent Labs    07/08/18 1135 01/27/19 0625 02/12/19 1351 02/13/19 0424  BILITOT 0.9 0.6 0.7 0.4  AST 19 24 58* 40  ALT _0 ALKPHOS 58 66 71 65  PROT 7.0 7.5 7.0 6.8  ALBUMIN 4.1 4.4 3.8 3.6    TUMOR MARKERS: No results for input(s): AFPTM, CEA, CA199, CHROMGRNA in the last 8760 hours.  Assessment and Plan: 73 y.o. female, former smoker, with prior history of stage I lung cancer treated with radiation therapy.   She was recently admitted with 1 week history of left eyelid drooping and double vision.  Subsequent imaging revealed pituitary adenoma and a 6 mm lesion which could be an infarct vs met.  Also present was extensive adenopathy throughout the posterior lower mediastinum, retroperitoneum and left pelvis compatible with metastatic disease.  There was also a new left adrenal metastasis as well as new mild right hydroureteronephrosis.  Request now received for CT-guided retroperitoneal lymph node biopsy for further evaluation.  Imaging studies have been reviewed by Dr. Pascal Lux.Risks and  benefits of procedure was discussed with the patient  including, but not limited to bleeding, infection, damage to adjacent structures or low yield requiring additional tests.  All of the questions were answered and there is agreement to proceed.  Consent signed and in chart.  Procedure scheduled for today   Thank you for this interesting consult.  I greatly enjoyed meeting Pamela Russell and look forward to participating in their care.  A copy of this report was sent to the requesting provider on this date.  Electronically Signed: D. Rowe Robert, PA-C 02/16/2019, 9:54 AM   I spent a total of  30 minutes   in face to face in clinical consultation, greater than 50% of which was counseling/coordinating care for CT-guided retroperitoneal lymph node biopsy

## 2019-02-16 NOTE — Progress Notes (Signed)
I called the patient's daughter Pamela Russell to discuss the findings from Pamela Russell's recent MRI scans, Pamela case will be discussed in our brain oncology conference this coming Monday.  The suspicion from what I can tell from the notes is that Pamela pituitary growth which could either be an adenomatous change versus a malignant metastasis appears to be causing Pamela symptoms of anopia.  She has been on steroids but Pamela daughter states that Pamela Russell has not noticed any significant improvement in the control Pamela ability to see other than manually opening Pamela eyelid.  She is been treated for hyponatremia and hypokalemia, she is also in the midst of undergoing a work-up for retroperitoneal and abdominal adenopathy.  Pamela history includes a stage I lung cancer for which we gave radiation at the beginning of this year.  We will follow-up with the results of Pamela biopsy, and continue to make recommendations.  I let Pamela daughter know that pending Pamela brain oncology conference discussion, we would be making recommendations for either surgery, radiation, or endocrinology referral.    Carola Rhine, [facility]

## 2019-02-16 NOTE — Progress Notes (Addendum)
Pharmacy Brief Consult Note - Post IR Procedure:  Pharmacy consulted to assess timing of anticoagulation post-IR procedure.   Current orders: enoxaparin 40 mg subQ daily for DVT ppx Procedure: CT guided biopsy of left sided retroperitoneal nodal mass on 02/16/19 Bleeding risk: Standard Guidelines/Protocol: Resume LMWH on Day +1 (next AM)  Resume enoxaparin 40 mg subQ daily on 6/30 AM.   Pharmacy to sign off, please re-consult if needed.   Lenis Noon, PharmD 02/16/19 1:00 PM

## 2019-02-17 ENCOUNTER — Encounter: Payer: Self-pay | Admitting: *Deleted

## 2019-02-17 ENCOUNTER — Ambulatory Visit (HOSPITAL_COMMUNITY): Admit: 2019-02-17 | Payer: Medicare Other

## 2019-02-17 ENCOUNTER — Telehealth: Payer: Self-pay | Admitting: *Deleted

## 2019-02-17 ENCOUNTER — Encounter (HOSPITAL_COMMUNITY): Payer: Self-pay

## 2019-02-17 DIAGNOSIS — C3411 Malignant neoplasm of upper lobe, right bronchus or lung: Secondary | ICD-10-CM

## 2019-02-17 NOTE — Telephone Encounter (Signed)
Oncology Nurse Navigator Documentation  Oncology Nurse Navigator Flowsheets 02/17/2019  Navigator Location [facility]  Referral Date to RadOnc/MedOnc 02/16/2019  Navigator Encounter Type Telephone/I updated Dr. Julien Nordmann on referral.  He would like to see Pamela Russell tomorrow.  I called her to schedule.  She verbalized understanding of appt time and place.   Telephone Outgoing Call  Abnormal Finding Date -  Confirmed Diagnosis Date -  Multidisiplinary Clinic Date -  Treatment Initiated Date -  Treatment Phase Pre-Tx/Tx Discussion  Barriers/Navigation Needs Coordination of Care;Education  Education Other  Interventions Coordination of Care;Education  Coordination of Care Appts  Education Method Verbal  Acuity Level 2  Time Spent with Patient 30

## 2019-02-19 ENCOUNTER — Other Ambulatory Visit: Payer: Self-pay

## 2019-02-19 ENCOUNTER — Inpatient Hospital Stay: Payer: Medicare Other

## 2019-02-19 ENCOUNTER — Telehealth: Payer: Self-pay | Admitting: Radiation Oncology

## 2019-02-19 ENCOUNTER — Other Ambulatory Visit: Payer: Self-pay | Admitting: *Deleted

## 2019-02-19 ENCOUNTER — Inpatient Hospital Stay: Payer: Medicare Other | Attending: Internal Medicine | Admitting: Internal Medicine

## 2019-02-19 VITALS — BP 93/52 | HR 106 | Temp 98.9°F | Resp 28 | Wt 99.7 lb

## 2019-02-19 DIAGNOSIS — Z5189 Encounter for other specified aftercare: Secondary | ICD-10-CM | POA: Diagnosis not present

## 2019-02-19 DIAGNOSIS — F419 Anxiety disorder, unspecified: Secondary | ICD-10-CM | POA: Insufficient documentation

## 2019-02-19 DIAGNOSIS — C3412 Malignant neoplasm of upper lobe, left bronchus or lung: Secondary | ICD-10-CM | POA: Diagnosis not present

## 2019-02-19 DIAGNOSIS — Z7982 Long term (current) use of aspirin: Secondary | ICD-10-CM | POA: Insufficient documentation

## 2019-02-19 DIAGNOSIS — E785 Hyperlipidemia, unspecified: Secondary | ICD-10-CM | POA: Diagnosis not present

## 2019-02-19 DIAGNOSIS — J449 Chronic obstructive pulmonary disease, unspecified: Secondary | ICD-10-CM | POA: Insufficient documentation

## 2019-02-19 DIAGNOSIS — C7972 Secondary malignant neoplasm of left adrenal gland: Secondary | ICD-10-CM

## 2019-02-19 DIAGNOSIS — I10 Essential (primary) hypertension: Secondary | ICD-10-CM

## 2019-02-19 DIAGNOSIS — Z5112 Encounter for antineoplastic immunotherapy: Secondary | ICD-10-CM | POA: Diagnosis present

## 2019-02-19 DIAGNOSIS — Z5111 Encounter for antineoplastic chemotherapy: Secondary | ICD-10-CM | POA: Diagnosis present

## 2019-02-19 DIAGNOSIS — F17210 Nicotine dependence, cigarettes, uncomplicated: Secondary | ICD-10-CM | POA: Diagnosis not present

## 2019-02-19 DIAGNOSIS — Z79899 Other long term (current) drug therapy: Secondary | ICD-10-CM | POA: Insufficient documentation

## 2019-02-19 DIAGNOSIS — Z923 Personal history of irradiation: Secondary | ICD-10-CM | POA: Insufficient documentation

## 2019-02-19 DIAGNOSIS — C3411 Malignant neoplasm of upper lobe, right bronchus or lung: Secondary | ICD-10-CM

## 2019-02-19 DIAGNOSIS — C3431 Malignant neoplasm of lower lobe, right bronchus or lung: Secondary | ICD-10-CM

## 2019-02-19 DIAGNOSIS — Z7951 Long term (current) use of inhaled steroids: Secondary | ICD-10-CM | POA: Insufficient documentation

## 2019-02-19 DIAGNOSIS — Z7189 Other specified counseling: Secondary | ICD-10-CM

## 2019-02-19 DIAGNOSIS — F17200 Nicotine dependence, unspecified, uncomplicated: Secondary | ICD-10-CM

## 2019-02-19 LAB — CMP (CANCER CENTER ONLY)
ALT: 22 U/L (ref 0–44)
AST: 21 U/L (ref 15–41)
Albumin: 4.1 g/dL (ref 3.5–5.0)
Alkaline Phosphatase: 60 U/L (ref 38–126)
Anion gap: 10 (ref 5–15)
BUN: 15 mg/dL (ref 8–23)
CO2: 28 mmol/L (ref 22–32)
Calcium: 9.5 mg/dL (ref 8.9–10.3)
Chloride: 101 mmol/L (ref 98–111)
Creatinine: 0.54 mg/dL (ref 0.44–1.00)
GFR, Est AFR Am: >60 mL/min
GFR, Estimated: >60 mL/min
Glucose, Bld: 158 mg/dL — ABNORMAL HIGH (ref 70–99)
Potassium: 3.7 mmol/L (ref 3.5–5.1)
Sodium: 139 mmol/L (ref 135–145)
Total Bilirubin: 0.2 mg/dL — ABNORMAL LOW (ref 0.3–1.2)
Total Protein: 6.9 g/dL (ref 6.5–8.1)

## 2019-02-19 LAB — CBC WITH DIFFERENTIAL (CANCER CENTER ONLY)
Abs Immature Granulocytes: 0.13 10*3/uL — ABNORMAL HIGH (ref 0.00–0.07)
Basophils Absolute: 0 10*3/uL (ref 0.0–0.1)
Basophils Relative: 0 %
Eosinophils Absolute: 0 10*3/uL (ref 0.0–0.5)
Eosinophils Relative: 0 %
HCT: 39.2 % (ref 36.0–46.0)
Hemoglobin: 12.9 g/dL (ref 12.0–15.0)
Immature Granulocytes: 1 %
Lymphocytes Relative: 13 %
Lymphs Abs: 1.4 10*3/uL (ref 0.7–4.0)
MCH: 31.2 pg (ref 26.0–34.0)
MCHC: 32.9 g/dL (ref 30.0–36.0)
MCV: 94.9 fL (ref 80.0–100.0)
Monocytes Absolute: 0.8 10*3/uL (ref 0.1–1.0)
Monocytes Relative: 8 %
Neutro Abs: 7.8 10*3/uL — ABNORMAL HIGH (ref 1.7–7.7)
Neutrophils Relative %: 78 %
Platelet Count: 373 10*3/uL (ref 150–400)
RBC: 4.13 MIL/uL (ref 3.87–5.11)
RDW: 14.9 % (ref 11.5–15.5)
WBC Count: 10.2 10*3/uL (ref 4.0–10.5)
nRBC: 0 % (ref 0.0–0.2)

## 2019-02-19 NOTE — Telephone Encounter (Signed)
We will do our best with Facetime but if we notice any barriers, We will ask her daughter to come in. Thank you.

## 2019-02-19 NOTE — Progress Notes (Signed)
The proposed treatment discussed in cancer conference 02/19/2019 is for discussion purpose only and is not a binding recommendation.  The patient was not physically examined nor present for their treatment options.

## 2019-02-19 NOTE — Progress Notes (Signed)
START ON PATHWAY REGIMEN - Small Cell Lung     Cycles 1 through 4, every 21 days:     Atezolizumab      Carboplatin      Etoposide    Cycles 5 and beyond, every 21 days:     Atezolizumab   **Always confirm dose/schedule in your pharmacy ordering system**  Patient Characteristics: Newly Diagnosed, Preoperative or Nonsurgical Candidate (Clinical Staging), First Line, Extensive Stage Therapeutic Status: Newly Diagnosed, Preoperative or Nonsurgical Candidate (Clinical Staging) AJCC T Category: cT1a AJCC N Category: cN2 AJCC M Category: cM1c AJCC 8 Stage Grouping: IVB Stage Classification: Extensive Intent of Therapy: Non-Curative / Palliative Intent, Discussed with Patient

## 2019-02-19 NOTE — Progress Notes (Signed)
[place] Telephone:([phone]   [phone] Multidisciplinary thoracic oncology clinic CONSULT NOTE  REFERRING PHYSICIAN: Dr. Kyung Rudd  REASON FOR CONSULTATION:  73 years old white female with recently diagnosed small cell lung cancer  HPI Pamela Russell is a 73 y.o. female with past medical history significant for allergic rhinitis, asthma, hypertension, dyslipidemia, history of bilateral non-small cell lung cancer, adenocarcinoma status post SBRT in November 2019, cataract surgery as well as history for smoking but quit 1 week ago.  The patient mentioned that in October 2018 she had CT screening of the chest because of her smoking history.  The scan showed scattered areas of ill-defined groundglass nodularity notably in the anterior segment right upper lobe and 7 mm central left upper lobe.  Repeat CT screening on 12/13/2017 showed the central peribronchial nodule in the left upper lobe had increased in size from the previous exam and measured 9.3 mm.  There was also 2 nonsolid nodules identified within the right upper lobe the larger nodule measured 13.7 mm and the adjacent smaller nodule increased in size and measures 6.6 mm.  On June 24, 2018 the patient had a PET scan performed and it showed hypermetabolic left upper lobe nodule just superior to the hilar vessels with intense metabolic activity consistent with primary bronchogenic carcinoma.  There was also new posterior wall thickening of the right upper lobe thin-walled cavitary lesion with intense metabolic activity for the size and the finding are also concerning for synchronous bronchogenic carcinoma.  There was no evidence of metastatic mediastinal adenopathy or distant metastatic disease.  On July 09, 2018 the patient underwent video bronchoscopy with electromagnetic navigational procedure under the care of Dr. Lamonte Sakai and the final pathology [id number]) of the right upper lobe lung biopsywere  consistent with adenocarcinoma.  The patient was seen by Dr. Lisbeth Renshaw and she underwent curative stereotactic body radiotherapy to the bilateral pulmonary nodules completed September 09, 2018.  The patient had the first restaging scan of the chest on 01/16/2019 and it showed 1.5 cm cavitary nodule in the right lower lobe, 0.6 cm solid nodule in the right upper lobe, 2.3 cm sub-solid nodule in the right and 0.7 cm subpleural nodule in the lateral right upper lobe.  There was also posterior mediastinal lymphadenopathy predominantly along the descending thoracic aorta measuring up to 2.3 cm in short axis and this appearance is new from the prior exam.  The scan also showed upper abdominal lymphadenopathy new from the previous imaging studies and include 2.6 cm short axis portacaval node, 2.0 cm in short axis porta hepatis node, 2.5 cm peripancreatic node and 1.8 cm left periaortic node.  CT of the abdomen pelvis was performed on 01/28/2019 and that showed extensive confluent adenopathy throughout the posterior lower mediastinum, retroperitoneum and left pelvis all new since June 24, 2018 PET/CT compatible with metastatic disease.  The heterogeneous enhancement within these nodes is more typical of metastatic carcinomas and lymphoma.  There was also a new left adrenal metastasis as well as new mild right hydro-ureteronephrosis without discrete obstructing stone or mass.  The patient presented to the emergency department on 02/12/2019 complaining of left 3rd nerve palsy for 6 days and MRI of the brain without contrast was performed and it showed a masslike enlargement of the right temporal lobe.  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  There was also enlarged pituitary gland with  evidence of left cavernous sinus invasion which may reflect a pituitary adenoma or metastasis.  On 02/16/2019 the patient underwent CT-guided biopsy of the indeterminate left sided retroperitoneal nodal conglomeration.  The final pathology ([id number]) showed metastatic small cell carcinoma. Immunohistochemical stains show that the tumor cells are positive for synaptophysin, CD56 and pancytokeratin (cytoplasmic, dot-like). The tumor cells are negative for TTF-1 and Napsin-A. Ki-67 shows a proliferative index of about 50%. This immunohistochemical profile is consistent with the above diagnosis but is not specific for a particular primary site. Please note that approximately 10-15% of primary pulmonary small cell carcinomas can be negative for TTF-1. The patient was referred to the multidisciplinary thoracic oncology clinic today for evaluation and recommendation regarding treatment of her condition. When seen today she is very anxious.  We allowed her daughter Pamela Russell)  to accompanied the patient despite no visitor policy because of her significant anxiety and memory issues.  The patient continues to complain of pain in her stomach as well as hyperactivity especially after starting Decadron for the suspicious brain lesions she is currently on 4 mg every 6 hours.  She also has some dry heaves but no vomiting and no diarrhea or constipation.  She takes laxative on as-needed basis.  The patient denied having any chest pain but has shortness of breath with exertion with no cough or hemoptysis.  She denied having any recent weight loss or night sweats. Family history significant for mother and father with heart disease.  Mother had a stroke. She is single and had 2 children her son is deceased.  She was accompanied by her daughter Pamela Shih.  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  She has a history for smoking up to 3 packs/day for around 60 years and quit 1 week ago.  She also drinks 8-12 beers a day.   She has no history of drug abuse.  HPI  Past Medical History:  Diagnosis Date   Allergic rhinitis    Asthma 2008   Chronic obstructive pulmonary disease (COPD) ([place]) 06/08/2016   Meds: Spiriva, albuterol inhaler PFT (11/29/2016): Moderate-severe obstruction by PFT 11/2016   Ear itching 07/05/2017   Grieving 06/08/2016   History of pneumonia    x4   Hyperlipidemia 09/10/2016   On atorvastatin 40 mg QD, ASA 81 mg QD ASCVD risk 17.1%, mod-high intensity statin Risk factors: FHx sig for MI: mother 21s-50s, and father age 23, HTN, smoker   Hypertension    Night sweats 06/08/2016   NSCL Ca dx'd 05/2018   xrt comp 09/2018   Pulmonary nodules 06/12/2017   Tobacco use disorder 07/06/2016   On nicotine replacement patch. Smoking cigs since age 3. Has 60 pack year history. Has COPD dx on SABA and LAMA.    Past Surgical History:  Procedure Laterality Date   CATARACT EXTRACTION Left 05/08/2016   CATARACT EXTRACTION Right 05/22/2016   RADIOLOGY WITH ANESTHESIA N/A 02/13/2019   Procedure: BRAIN MRI;  Surgeon: Radiologist, Medication, MD;  Location: [facility];  Service: Radiology;  Laterality: N/A;   TOTAL ABDOMINAL HYSTERECTOMY W/ BILATERAL SALPINGOOPHORECTOMY Bilateral 1975   VIDEO BRONCHOSCOPY WITH ENDOBRONCHIAL NAVIGATION Left 07/09/2018   Procedure: VIDEO BRONCHOSCOPY WITH ENDOBRONCHIAL NAVIGATION;  Surgeon: Collene Gobble, MD;  Location: [facility];  Service: Thoracic;  Laterality: Left;    Family History  Problem Relation Age of Onset  Heart disease Mother        MI in her 17's   Stroke Mother    Heart disease Father        CABG   Hypertension Father    Heart disease Sister 20       CABG   Stroke Sister    Diabetes Sister     Social History Social History   Tobacco Use   Smoking status: Former Smoker    Packs/day: 1.00    Years: 60.00    Pack years: 60.00    Types: Cigarettes    Start date: 08/21/1955    Quit date: 01/28/2019    Years since quitting: 0.0    Smokeless tobacco: Never Used   Tobacco comment: 3 ppd for 20+ years, has cut down to 0.5 ppd, in the past 3 months, no cigs in 1 month  Substance Use Topics   Alcohol use: Yes    Alcohol/week: 1.0 standard drinks    Types: 1 Cans of beer per week    Comment: occ   Drug use: No    Allergies  Allergen Reactions   Codeine Anaphylaxis and Swelling   Penicillins Anaphylaxis and Swelling    Childhood reaction. Has patient had a PCN reaction causing immediate rash, facial/tongue/throat swelling, SOB or lightheadedness with hypotension: Yes Has patient had a PCN reaction causing severe rash involving mucus membranes or skin necrosis: No Has patient had a PCN reaction that required hospitalization: No Has patient had a PCN reaction occurring within the last 10 years: No If all of the above answers are "NO", then may proceed with Cephalosporin use.    Anoro Ellipta [Umeclidinium-Vilanterol] Nausea And Vomiting    Severe     Current Outpatient Medications  Medication Sig Dispense Refill   albuterol (PROAIR HFA) 108 (90 Base) MCG/ACT inhaler Inhale 2 puffs into the lungs every 6 (six) hours as needed for wheezing or shortness of breath. 3 Inhaler 1   albuterol (PROVENTIL) (2.5 MG/3ML) 0.083% nebulizer solution Take 2.5 mg by nebulization daily as needed for wheezing or shortness of breath.     aspirin EC 81 MG tablet Take 81 mg by mouth daily.     cetirizine (ZYRTEC) 10 MG tablet Take 10 mg by mouth daily.     dexamethasone (DECADRON) 4 MG tablet Take 1 tablet (4 mg total) by mouth every 6 (six) hours for 30 days. 120 tablet 0   fluticasone (FLONASE) 50 MCG/ACT nasal spray Place 2 sprays into both nostrils daily. (Patient taking differently: Place 2 sprays into both nostrils 2 (two) times a day. ) 16 g 1   glucosamine-chondroitin 500-400 MG tablet Take 1 tablet by mouth daily.      Glycopyrrolate-Formoterol (BEVESPI AEROSPHERE) 9-4.8 MCG/ACT AERO Inhale 2 puffs into the lungs  2 (two) times daily. 3 Inhaler 2   mometasone (ELOCON) 0.1 % lotion Apply 1 application topically daily. 60 mL 3   Multiple Vitamin (MULTIVITAMIN WITH MINERALS) TABS tablet Take 1 tablet by mouth daily.     No current facility-administered medications for this visit.     Review of Systems  Constitutional: positive for fatigue Eyes: negative Ears, nose, mouth, throat, and face: negative Respiratory: positive for dyspnea on exertion Cardiovascular: negative Gastrointestinal: positive for constipation and nausea Genitourinary:negative Integument/breast: negative Hematologic/lymphatic: negative Musculoskeletal:positive for arthralgias and muscle weakness Neurological: negative Behavioral/Psych: positive for anxiety and irritability Endocrine: negative Allergic/Immunologic: negative  Physical Exam  BWI:OMBTD, healthy, no distress, well nourished, well developed and anxious SKIN: skin  color, texture, turgor are normal, no rashes or significant lesions HEAD: Normocephalic, No masses, lesions, tenderness or abnormalities EYES: normal, PERRLA EARS: External ears normal, Canals clear OROPHARYNX:no exudate, no erythema and lips, buccal mucosa, and tongue normal  NECK: supple, no adenopathy, no JVD LYMPH:  no palpable lymphadenopathy, no hepatosplenomegaly BREAST:not examined LUNGS: clear to auscultation , and palpation HEART: regular rate & rhythm, no murmurs and no gallops ABDOMEN:abdomen soft, non-tender, normal bowel sounds and no masses or organomegaly BACK: Back symmetric, no curvature., No CVA tenderness EXTREMITIES:no joint deformities, effusion, or inflammation, no edema  NEURO: alert & oriented x 3 with fluent speech, no focal motor/sensory deficits  PERFORMANCE STATUS: ECOG 1  LABORATORY DATA: Lab Results  Component Value Date   WBC 10.2 02/19/2019   HGB 12.9 02/19/2019   HCT 39.2 02/19/2019   MCV 94.9 02/19/2019   PLT 373 02/19/2019      Chemistry        Component Value Date/Time   NA 144 02/16/2019 0500   NA 130 (L) 01/26/2019 1153   K 3.8 02/16/2019 0500   CL 108 02/16/2019 0500   CO2 25 02/16/2019 0500   BUN 7 (L) 02/16/2019 0500   BUN 5 (L) 01/26/2019 1153   CREATININE 0.47 02/16/2019 0500   CREATININE 0.59 01/16/2019 1216   CREATININE 0.69 06/08/2016 1128      Component Value Date/Time   CALCIUM 9.5 02/16/2019 0500   ALKPHOS 65 02/13/2019 0424   AST 40 02/13/2019 0424   ALT 11 02/13/2019 0424   BILITOT 0.4 02/13/2019 0424   BILITOT 0.3 05/27/2017 1458       RADIOGRAPHIC STUDIES: Dg Chest 2 View  Result Date: 01/21/2019 CLINICAL DATA:  Shortness of breath. EXAM: CHEST - 2 VIEW COMPARISON:  Radiographs of October 21, 2018.  CT scan of Jan 16, 2019. FINDINGS: The heart size and mediastinal contours are within normal limits. Both lungs are clear. Atherosclerosis of thoracic aorta is noted. No pneumothorax or pleural effusion is noted. Hyperexpansion of the lungs is noted. The visualized skeletal structures are unremarkable. IMPRESSION: No acute cardiopulmonary abnormality seen. Hyperexpansion of the lungs is noted consistent with chronic obstructive pulmonary disease. Aortic Atherosclerosis (ICD10-I70.0). Electronically Signed   By: Marijo Conception M.D.   On: 01/21/2019 13:36   Mr Brain Wo Contrast  Result Date: 02/12/2019 CLINICAL DATA:  73 year old female with left 3rd nerve palsy for 6 days. History of lung cancer treated earlier this year. EXAM: MRI HEAD WITHOUT CONTRAST TECHNIQUE: Multiplanar, multiecho pulse sequences of the brain and surrounding structures were obtained without intravenous contrast. COMPARISON:  None. FINDINGS: Study is intermittently degraded by motion artifact despite repeated imaging attempts. Brain: There is a small focus of restricted diffusion in the inferior left occipital lobe white matter best seen on series 11, image 11. However, no brainstem diffusion abnormality is identified. Faint if any associated  T2 and FLAIR hyperintensity in the left occipital lobe white matter. No associated hemorrhage or mass effect. However, the right temporal lobe is abnormal, with mass-like soft tissue thickening and architectural distortion most apparent on series 12, image 15 and series 5, image 10. There is associated mass effect on the right temporal horn, although no ventriculomegaly. However, the abnormal parenchyma appears largely isointense to other gray and white matter signal. It is unclear whether the some of the right insula might also be affected and abnormally thickened (series 10, image 50). No midline shift. Basilar cisterns remain normal. The deep gray matter nuclei brainstem and  cerebellum appear normal. No cortical encephalomalacia or chronic cerebral blood products. Cervicomedullary junction within normal limits. Vascular: Partial loss of the distal left vertebral artery flow void (series 5, image 2). Other Major intracranial vascular flow voids are preserved. Skull and upper cervical spine: Negative visible cervical spine. Visualized bone marrow signal is within normal limits. Sinuses/Orbits: The pituitary gland also appears at the upper limits of normal, but the suprasellar cistern is preserved. Normal noncontrast appearance of the cavernous sinuses. Postoperative changes to both globes, the intraorbital soft tissues appear normal. Paranasal sinuses are clear. Other: Mastoid air cells are clear. Visible internal auditory structures appear normal. Scalp and face soft tissues appear negative. IMPRESSION: 1. Intermittently motion degraded study despite premedication and repeated imaging attempts. 2. The dominant abnormality is mass-like enlargement of the right temporal lobe, although the affected area appears largely isointense to normal brain parenchyma. The right insula and perhaps also the pituitary gland may be affected. Main differential considerations at this point are an infiltrative tumor, and cortical  dysplasia (although the latter would seem unlikely if there is no chronic seizure history). Recommend follow-up Brain MRI with sedation (or anesthesia), done without and with IV contrast. 3. 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 No brainstem ischemia or abnormality identified. 4. 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 Electronically Signed   By: Genevie Ann M.D.   On: 02/12/2019 15:13   Mr Jeri Cos VX Contrast  Result Date: 02/13/2019 CLINICAL DATA:  Left ptosis and diplopia. History of non-small cell lung cancer with abnormal appearance of the right temporal lobe on a motion degraded noncontrast MRI yesterday. EXAM: MRI HEAD WITHOUT AND WITH CONTRAST TECHNIQUE: Multiplanar, multiecho pulse sequences of the brain and surrounding structures were obtained without and with intravenous contrast. CONTRAST:  5 mL Gadavist COMPARISON:  Noncontrast brain MRI 02/12/2019 FINDINGS: Brain: Abnormal appearance of the right temporal lobe described on yesterday's MRI is nonenhancing and on today's less motion degraded examination reflects extensive subcortical heterotopia throughout the mesial temporal lobe with dysmorphic right temporal horn. There is a 6 mm focus of ring enhancement in the left occipital lobe which corresponds to the restricted diffusion seen on yesterday's study without significant vasogenic edema. No second enhancing brain lesion is identified. However, there is diffuse enlargement and heterogeneous enhancement of the pituitary gland which measures 12 mm in height and is contiguous with heterogeneously hypoenhancing tissue extending into the left cavernous sinus. The pituitary bulges into the suprasellar cistern, however there is no mass effect on the optic chiasm. No acute infarct, intracranial hemorrhage, midline shift, or extra-axial fluid collection is identified. A few scattered small foci of T2 hyperintensity in the cerebral white matter are nonspecific though may  reflect minimal chronic small vessel ischemic disease. There is a small chronic cortical infarct posteriorly in the left frontal lobe. There is mild cerebral atrophy. Vascular: Major intracranial vascular flow voids are preserved. Skull and upper cervical spine: Unremarkable bone marrow signal. Sinuses/Orbits: Bilateral cataract extraction. Paranasal sinuses and mastoid air cells are clear. Other: None. IMPRESSION: 1. 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 2. 6 mm ring-enhancing lesion in the left occipital lobe with considerations including subacute infarct and metastasis. Consider short-term follow-up MRI in 6 weeks. 3. 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 Electronically Signed   By: Logan Bores M.D.   On: 02/13/2019 16:22   Ct Abdomen Pelvis W Contrast  Result Date:  01/28/2019 CLINICAL DATA:  Right upper lobe lung adenocarcinoma and putatively non-small cell left upper lobe lung cancer status post bilateral radiation therapy completed 09/12/2018. Extensive new mediastinal and partially visualized upper abdominal adenopathy on chest CT study. Patient presents for completion of restaging evaluation. EXAM: CT ABDOMEN AND PELVIS WITH CONTRAST TECHNIQUE: Multidetector CT imaging of the abdomen and pelvis was performed using the standard protocol following bolus administration of intravenous contrast. CONTRAST:  180m OMNIPAQUE IOHEXOL 300 MG/ML  SOLN COMPARISON:  01/16/2027 chest CT.  06/24/2018 PET-CT. FINDINGS: Lower chest: No acute abnormality at the lung bases. Coronary atherosclerosis. Multiple enlarged heterogeneously enhancing bilateral posterior lower mediastinal lymph nodes, largest 2.4 cm on the right (series 2/image 12), stable since recent 01/16/2019 chest CT, all new since 06/24/2018 PET-CT. Hepatobiliary: Normal liver size. No liver mass. Normal gallbladder with no radiopaque  cholelithiasis. No biliary ductal dilatation. Pancreas: Normal, with no mass or duct dilation. Spleen: Normal size. No mass. Adrenals/Urinary Tract: Left adrenal 1.6 cm nodule with density 79 HU, new since 06/24/2018 PET-CT. No right adrenal nodules. Mild right hydroureteronephrosis to the level of the right ureterovesical junction without obstructing stone or discrete obstructing mass. No left hydronephrosis. Contrast nephrograms are symmetric and normal. No renal masses. Mildly distended and otherwise normal bladder. Stomach/Bowel: Normal non-distended stomach. Normal caliber small bowel with no small bowel wall thickening. Normal appendix. Oral contrast transits to the distal colon. Moderate sigmoid diverticulosis, with no large bowel wall thickening or significant pericolonic fat stranding. Vascular/Lymphatic: Atherosclerotic nonaneurysmal abdominal aorta. Patent portal, splenic, hepatic and renal veins. There is extensive confluent adenopathy throughout the gastrohepatic ligament, porta hepatis, portacaval, aortocaval, left para-aortic and left common and external iliac nodal chains, demonstrating heterogeneous enhancement, all new since 06/24/2018 PET-CT. Representative 2.9 cm gastrohepatic ligament node (series 2/image 21), which may directly invade the pancreatic body. Representative 2.3 cm porta hepatis node (series 2/image 20). Representative 2.2 cm aortocaval node (series 2/image 29). Representative 2.2 cm left para-aortic node (series 2/image 31). Representative 1.5 cm left common iliac node (series 2/image 42). Representative 1.5 cm left external iliac node (series 2/image 54). Reproductive: Status post hysterectomy, with no abnormal findings at the vaginal cuff. No adnexal mass. Other: No pneumoperitoneum, ascites or focal fluid collection. Musculoskeletal: No aggressive appearing focal osseous lesions. Moderate lumbar spondylosis. IMPRESSION: 1. Extensive confluent adenopathy throughout the posterior  lower mediastinum, retroperitoneum and left pelvis, all new since 06/24/2018 PET-CT, compatible with metastatic disease. The heterogeneous enhancement within these nodes is more typical of metastatic carcinoma than lymphoma. 2. New left adrenal metastasis. 3. New mild right hydroureteronephrosis without discrete obstructing stone or mass. Contrast nephrograms are symmetric and normal, suggesting absence of high-grade obstruction. 4.  Aortic Atherosclerosis (ICD10-I70.0). Electronically Signed   By: JIlona SorrelM.D.   On: 01/28/2019 17:29   Ct Biopsy  Result Date: 02/16/2019 INDICATION: History of small cell lung cancer, now with indeterminate retroperitoneal lymphadenopathy. Please perform CT-guided retroperitoneal lymph node biopsy for tissue diagnostic purposes. EXAM: CT-GUIDED BIOPSY OF INDETERMINATE LEFT-SIDED RETROPERITONEAL NODAL CONGLOMERATION COMPARISON:  CT abdomen and pelvis-01/28/2019 MEDICATIONS: None. ANESTHESIA/SEDATION: Fentanyl 100 mcg IV; Versed 2 mg IV Sedation time: 16 minutes; The patient was continuously monitored during the procedure by the interventional radiology nurse under my direct supervision. CONTRAST:  None. COMPLICATIONS: None immediate. PROCEDURE: Informed consent was obtained from the patient following an explanation of the procedure, risks, benefits and alternatives. A time out was performed prior to the initiation of the procedure. The patient was positioned prone on the CT table  and a limited CT was performed for procedural planning demonstrating unchanged size and appearance infiltrative left-sided retroperitoneal nodal conglomeration with dominant macrolobulated component measuring at least 4.3 x 3.5 cm (image 36, series 2). The procedure was planned. The operative site was prepped and draped in the usual sterile fashion. Appropriate trajectory was confirmed with a 22 gauge spinal needle after the adjacent tissues were anesthetized with 1% Lidocaine with epinephrine. Under  intermittent CT guidance, a 17 gauge coaxial needle was advanced into the peripheral aspect of the mass. Appropriate positioning was confirmed and 6 core needle biopsy samples were obtained with an 18 gauge core needle biopsy device. The co-axial needle was removed following the administration of a Gel-Foam slurry and hemostasis was achieved with manual compression. A limited postprocedural CT was negative for hemorrhage or additional complication. A dressing was placed. The patient tolerated the procedure well without immediate postprocedural complication. IMPRESSION: Technically successful CT guided core needle biopsy of indeterminate left-sided retroperitoneal nodal conglomeration. Electronically Signed   By: Sandi Mariscal M.D.   On: 02/16/2019 14:20    ASSESSMENT: This is a very pleasant 73 years old white female with history of bilateral adenocarcinoma of the lung presenting with right upper lobe and left upper lobe pulmonary nodules status post curative SBRT completed in January 2020.  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   PLAN: I had a lengthy discussion with the patient and her daughter today about her current disease stage, prognosis and treatment options.  I personally and independently reviewed the scan images and discussed the result and showed the images to the patient and her daughter. I recommended for the patient to complete the staging work-up by ordering a PET scan to rule out any other metastatic disease. For the brain lesion, she is followed by Dr. Lisbeth Renshaw and he is going to discuss it with the brain tumor board early next week for further recommendation. I explained to the patient that she has incurable condition and or the treatment will be of palliative nature. I gave the patient the option of palliative care versus  palliative systemic chemotherapy with carboplatin for AUC of 5 on day 1, etoposide 100 mg/M2 on days 1, 2 and 3 in addition to Imfinzi 1500 mg IV every 3 weeks for 4 cycles followed by maintenance Imfinzi every 4 weeks if the patient has no evidence for disease progression after cycle #4. The patient is interested in treatment. I discussed with her the adverse effect of this treatment including but not limited to alopecia, myelosuppression, nausea and vomiting, peripheral neuropathy, liver or renal dysfunction as well as immunotherapy adverse effects. She is expected to start the first cycle of this treatment on March 02, 2019. I will arrange for the patient to have a chemotherapy education class before the first dose of chemotherapy. I patient was encouraged to quit smoking. I will sent a Rx of compazine and EMLA cream to her pharmacy. I will arrange for her to have a Port A cath placed by IR. Before chemotherapy. The patient was advised to call immediately if she has any concerning symptoms in the interval. She will come back for follow-up visit with the start of the first cycle of her treatment. The patient voices understanding of current disease status and treatment options and is in agreement with the current care plan.  All questions were answered. The patient  knows to call the clinic with any problems, questions or concerns. We can certainly see the patient much sooner if necessary.  Thank you so much for allowing me to participate in the care of DAKARI STABLER. I will continue to follow up the patient with you and assist in her care.  I spent 55 minutes counseling the patient face to face. The total time spent in the appointment was 80 minutes.  Disclaimer: This note was dictated with voice recognition software. Similar sounding words can inadvertently be transcribed and may not be corrected upon review.   Eilleen Kempf February 19, 2019, 4:02 PM

## 2019-02-19 NOTE — Telephone Encounter (Signed)
I spoke with the patient's daughter to let her know we had discussed her mother's situation in conference. We talked about GI prophylaxis and tapering Dexamethasone to 4 mg TID instead of QID since her mother has noticed improvement in the left eyelid which she can now open. She remains confused and has had significant trouble with sleep. She has not slept in 4 days, and we discussed OTC sleep aids to try while taking steroids. Her case will be discussed also in brain oncology conference on Monday and I will follow up with them by phone to review our discussion. She requests the ability to be present today due to her mother's confusion for her appointment with Dr. Julien Nordmann. I let her know he would have to approve this request and I would forward this to him.

## 2019-02-21 ENCOUNTER — Encounter: Payer: Self-pay | Admitting: Internal Medicine

## 2019-02-21 DIAGNOSIS — Z5111 Encounter for antineoplastic chemotherapy: Secondary | ICD-10-CM | POA: Insufficient documentation

## 2019-02-21 DIAGNOSIS — Z5112 Encounter for antineoplastic immunotherapy: Secondary | ICD-10-CM | POA: Insufficient documentation

## 2019-02-21 DIAGNOSIS — Z7189 Other specified counseling: Secondary | ICD-10-CM | POA: Insufficient documentation

## 2019-02-21 MED ORDER — LIDOCAINE-PRILOCAINE 2.5-2.5 % EX CREA: 1.0000 "application " | TOPICAL_CREAM | Freq: Once | CUTANEOUS | 0 refills | Status: AC

## 2019-02-21 MED ORDER — PROCHLORPERAZINE MALEATE 10 MG PO TABS: 10.0000 mg | ORAL_TABLET | Freq: Four times a day (QID) | ORAL | 0 refills | Status: DC | PRN

## 2019-02-24 ENCOUNTER — Telehealth: Payer: Self-pay | Admitting: *Deleted

## 2019-02-24 ENCOUNTER — Telehealth: Payer: Self-pay | Admitting: Internal Medicine

## 2019-02-24 ENCOUNTER — Other Ambulatory Visit: Payer: Self-pay | Admitting: Radiation Oncology

## 2019-02-24 ENCOUNTER — Other Ambulatory Visit: Payer: Self-pay | Admitting: Radiology

## 2019-02-24 DIAGNOSIS — E236 Other disorders of pituitary gland: Secondary | ICD-10-CM

## 2019-02-24 NOTE — Telephone Encounter (Signed)
Scheduled appt per 7/02 los - unable to reach pt The patient left the office before the visit was finished. Message for chemo edu .

## 2019-02-24 NOTE — Telephone Encounter (Signed)
CALLED PATIENT'S DAUGHTER ABOUT SCHEDULING LAB, DUE TO LAB CLOSING @ 3 PM ON Wednesday, LVM FOR A RETURN CALL

## 2019-02-24 NOTE — Telephone Encounter (Signed)
CALLED PATIENT'S DAUGHTER TO ASK ABOUT COMING IN FOR LAB, PATIENT'S DAUGHTER- SISSY ELLIS AND SHE AGREED TO 02-25-19 @ 3:15 PM

## 2019-02-24 NOTE — Telephone Encounter (Signed)
Called patient's daughter- Mont Dutton to inform of lab appt. On 02-26-19 @ 10:30 am @ [facility] and her flush right after @ 10:45 am on 02-26-19, spoke with patient's daughter, Mont Dutton and she is aware of these appts.

## 2019-02-25 ENCOUNTER — Telehealth: Payer: Self-pay | Admitting: Internal Medicine

## 2019-02-25 ENCOUNTER — Ambulatory Visit: Payer: Medicare Other

## 2019-02-25 ENCOUNTER — Ambulatory Visit (HOSPITAL_COMMUNITY)
Admission: RE | Admit: 2019-02-25 | Discharge: 2019-02-25 | Disposition: A | Discharge status: Home / Self Care | Payer: Medicare Other | Source: Ambulatory Visit | Attending: Radiation Oncology | Admitting: Radiation Oncology

## 2019-02-25 ENCOUNTER — Encounter (HOSPITAL_COMMUNITY): Payer: Self-pay

## 2019-02-25 ENCOUNTER — Other Ambulatory Visit: Payer: Self-pay | Admitting: Internal Medicine

## 2019-02-25 ENCOUNTER — Ambulatory Visit (HOSPITAL_COMMUNITY)
Admission: RE | Admit: 2019-02-25 | Discharge: 2019-02-25 | Disposition: A | Discharge status: Home / Self Care | Payer: Medicare Other | Source: Ambulatory Visit | Attending: Internal Medicine | Admitting: Internal Medicine

## 2019-02-25 ENCOUNTER — Other Ambulatory Visit: Payer: Self-pay

## 2019-02-25 DIAGNOSIS — E785 Hyperlipidemia, unspecified: Secondary | ICD-10-CM | POA: Diagnosis not present

## 2019-02-25 DIAGNOSIS — Z885 Allergy status to narcotic agent status: Secondary | ICD-10-CM | POA: Insufficient documentation

## 2019-02-25 DIAGNOSIS — Z880 Allergy status to penicillin: Secondary | ICD-10-CM | POA: Diagnosis not present

## 2019-02-25 DIAGNOSIS — Z90722 Acquired absence of ovaries, bilateral: Secondary | ICD-10-CM | POA: Insufficient documentation

## 2019-02-25 DIAGNOSIS — Z7982 Long term (current) use of aspirin: Secondary | ICD-10-CM | POA: Diagnosis not present

## 2019-02-25 DIAGNOSIS — Z87891 Personal history of nicotine dependence: Secondary | ICD-10-CM | POA: Diagnosis not present

## 2019-02-25 DIAGNOSIS — Z8249 Family history of ischemic heart disease and other diseases of the circulatory system: Secondary | ICD-10-CM | POA: Diagnosis not present

## 2019-02-25 DIAGNOSIS — Z823 Family history of stroke: Secondary | ICD-10-CM | POA: Insufficient documentation

## 2019-02-25 DIAGNOSIS — Z79899 Other long term (current) drug therapy: Secondary | ICD-10-CM | POA: Diagnosis not present

## 2019-02-25 DIAGNOSIS — C3431 Malignant neoplasm of lower lobe, right bronchus or lung: Secondary | ICD-10-CM | POA: Diagnosis not present

## 2019-02-25 DIAGNOSIS — J449 Chronic obstructive pulmonary disease, unspecified: Secondary | ICD-10-CM | POA: Insufficient documentation

## 2019-02-25 DIAGNOSIS — I10 Essential (primary) hypertension: Secondary | ICD-10-CM | POA: Insufficient documentation

## 2019-02-25 DIAGNOSIS — Z888 Allergy status to other drugs, medicaments and biological substances status: Secondary | ICD-10-CM | POA: Diagnosis not present

## 2019-02-25 DIAGNOSIS — Z90710 Acquired absence of both cervix and uterus: Secondary | ICD-10-CM | POA: Diagnosis not present

## 2019-02-25 HISTORY — PX: IR IMAGING GUIDED PORT INSERTION: IMG5740

## 2019-02-25 LAB — CBC
HCT: 40.7 % (ref 36.0–46.0)
Hemoglobin: 13.6 g/dL (ref 12.0–15.0)
MCH: 31.9 pg (ref 26.0–34.0)
MCHC: 33.4 g/dL (ref 30.0–36.0)
MCV: 95.5 fL (ref 80.0–100.0)
Platelets: 280 10*3/uL (ref 150–400)
RBC: 4.26 MIL/uL (ref 3.87–5.11)
RDW: 15.4 % (ref 11.5–15.5)
WBC: 13.8 10*3/uL — ABNORMAL HIGH (ref 4.0–10.5)
nRBC: 0 % (ref 0.0–0.2)

## 2019-02-25 LAB — PROTIME-INR
INR: 0.9 (ref 0.8–1.2)
Prothrombin Time: 12.2 seconds (ref 11.4–15.2)

## 2019-02-25 MED ORDER — MIDAZOLAM HCL 2 MG/2ML IJ SOLN
INTRAMUSCULAR | Status: AC | PRN
  Administered 2019-02-25: 0.5 mg via INTRAVENOUS
  Administered 2019-02-25: 1 mg via INTRAVENOUS
  Administered 2019-02-25: 0.5 mg via INTRAVENOUS
  Administered 2019-02-25: 1 mg via INTRAVENOUS

## 2019-02-25 MED ORDER — HEPARIN SOD (PORK) LOCK FLUSH 100 UNIT/ML IV SOLN
INTRAVENOUS | Status: AC | PRN
  Administered 2019-02-25: 500 [IU] via INTRAVENOUS

## 2019-02-25 MED ORDER — SODIUM CHLORIDE 0.9 % IV SOLN
INTRAVENOUS | Status: DC
  Administered 2019-02-25: 12:00:00 via INTRAVENOUS

## 2019-02-25 MED ORDER — MIDAZOLAM HCL 2 MG/2ML IJ SOLN
INTRAMUSCULAR | Status: AC
  Filled 2019-02-25: qty 4

## 2019-02-25 MED ORDER — LIDOCAINE-EPINEPHRINE (PF) 2 %-1:200000 IJ SOLN
INTRAMUSCULAR | Status: AC
  Filled 2019-02-25: qty 20

## 2019-02-25 MED ORDER — LIDOCAINE-EPINEPHRINE (PF) 2 %-1:200000 IJ SOLN
INTRAMUSCULAR | Status: AC | PRN
  Administered 2019-02-25 (×2): 10 mL

## 2019-02-25 MED ORDER — HEPARIN SOD (PORK) LOCK FLUSH 100 UNIT/ML IV SOLN
INTRAVENOUS | Status: AC
  Filled 2019-02-25: qty 5

## 2019-02-25 MED ORDER — FENTANYL CITRATE (PF) 100 MCG/2ML IJ SOLN
INTRAMUSCULAR | Status: AC
  Filled 2019-02-25: qty 2

## 2019-02-25 MED ORDER — CLINDAMYCIN PHOSPHATE 900 MG/50ML IV SOLN
INTRAVENOUS | Status: AC
  Administered 2019-02-25: 14:00:00 900 mg via INTRAVENOUS
  Filled 2019-02-25: qty 50

## 2019-02-25 MED ORDER — FENTANYL CITRATE (PF) 100 MCG/2ML IJ SOLN
INTRAMUSCULAR | Status: AC | PRN
  Administered 2019-02-25 (×2): 50 ug via INTRAVENOUS

## 2019-02-25 MED ORDER — CLINDAMYCIN PHOSPHATE 900 MG/50ML IV SOLN
900.0000 mg | Freq: Once | INTRAVENOUS | Status: AC
  Administered 2019-02-25: 14:00:00 900 mg via INTRAVENOUS

## 2019-02-25 NOTE — H&P (Signed)
Chief Complaint: Patient was seen in consultation today for small cell lung cancer  Referring Physician(s): Mohamed,Mohamed  Supervising Physician: Daryll Brod  Patient Status: Pamela Russell - Out-pt  History of Present Illness: Pamela Russell is a 73 y.o. female with past medical history of asthma, HTN, bilateral non-small cell lung cancer, adenocarcinoma s/p SBRT in November 2019. A re-staging CT in 12/2018 showed progression with extensive adenopathy throughout the posterior lower mediastinum, retroperitoneum, and left pelvis as well as left adrenal metastasis.  She then presented to the ED 02/12/19 due to left 3rd nerve palsy. Now on decadron. MRI performed showed right temporal lobe mass and enlargement of the pituitary gland concerning for pituitary adenoma vs. Metastasis.  CT-guided retroperitoneal adenopathy biopsy 02/16/19 revealed metastatic small cell carcinoma. She now has plans for upcoming chemotherapy and is in need of durable venous access.   Patient presents to radiology today in her usual state of health. She is not on O2 at home and is stable on room air today.  She does get short of breath with extensive conversation/talking. She has anxiety at baseline which is heightened with use of decadron.  She is alert and oriented. She is able to clearly explain goals of the procedure today.  She has been NPO.  She does not take blood thinners.   Past Medical History:  Diagnosis Date   Allergic rhinitis    Asthma 2008   Chronic obstructive pulmonary disease (COPD) ([place]) 06/08/2016   Meds: Spiriva, albuterol inhaler PFT (11/29/2016): Moderate-severe obstruction by PFT 11/2016   Ear itching 07/05/2017   Grieving 06/08/2016   History of pneumonia    x4   Hyperlipidemia 09/10/2016   On atorvastatin 40 mg QD, ASA 81 mg QD ASCVD risk 17.1%, mod-high intensity statin Risk factors: FHx sig for MI: mother 52s-50s, and father age 48, HTN, smoker   Hypertension    Night sweats  06/08/2016   NSCL Ca dx'd 05/2018   xrt comp 09/2018   Pulmonary nodules 06/12/2017   Tobacco use disorder 07/06/2016   On nicotine replacement patch. Smoking cigs since age 80. Has 60 pack year history. Has COPD dx on SABA and LAMA.    Past Surgical History:  Procedure Laterality Date   CATARACT EXTRACTION Left 05/08/2016   CATARACT EXTRACTION Right 05/22/2016   RADIOLOGY WITH ANESTHESIA N/A 02/13/2019   Procedure: BRAIN MRI;  Surgeon: Radiologist, Medication, MD;  Location: [facility];  Service: Radiology;  Laterality: N/A;   TOTAL ABDOMINAL HYSTERECTOMY W/ BILATERAL SALPINGOOPHORECTOMY Bilateral 1975   VIDEO BRONCHOSCOPY WITH ENDOBRONCHIAL NAVIGATION Left 07/09/2018   Procedure: VIDEO BRONCHOSCOPY WITH ENDOBRONCHIAL NAVIGATION;  Surgeon: Collene Gobble, MD;  Location: [facility];  Service: Thoracic;  Laterality: Left;    Allergies: Codeine, Penicillins, and Anoro ellipta [umeclidinium-vilanterol]  Medications: Prior to Admission medications   Medication Sig Start Date End Date Taking? Authorizing Provider  albuterol (PROAIR HFA) 108 (90 Base) MCG/ACT inhaler Inhale 2 puffs into the lungs every 6 (six) hours as needed for wheezing or shortness of breath. 12/17/18   Mannam, Praveen, MD  albuterol (PROVENTIL) (2.5 MG/3ML) 0.083% nebulizer solution Take 2.5 mg by nebulization daily as needed for wheezing or shortness of breath.    [provider]  aspirin EC 81 MG tablet Take 81 mg by mouth daily.    [provider]  cetirizine (ZYRTEC) 10 MG tablet Take 10 mg by mouth daily.    [provider]  dexamethasone (DECADRON) 4 MG tablet Take 1 tablet (4 mg total)  by mouth every 6 (six) hours for 30 days. 02/16/19 03/18/19  Antonieta Pert, MD  fluticasone (FLONASE) 50 MCG/ACT nasal spray Place 2 sprays into both nostrils daily. Patient taking differently: Place 2 sprays into both nostrils 2 (two) times a day.  02/11/19   Patriciaann Clan, DO  glucosamine-chondroitin 500-400  MG tablet Take 1 tablet by mouth daily.     [provider]  Glycopyrrolate-Formoterol (BEVESPI AEROSPHERE) 9-4.8 MCG/ACT AERO Inhale 2 puffs into the lungs 2 (two) times daily. 12/17/18   Fenton Foy, NP  mometasone (ELOCON) 0.1 % lotion Apply 1 application topically daily. 01/08/19   [place] Bing, DO  Multiple Vitamin (MULTIVITAMIN WITH MINERALS) TABS tablet Take 1 tablet by mouth daily.    [provider]  prochlorperazine (COMPAZINE) 10 MG tablet Take 1 tablet (10 mg total) by mouth every 6 (six) hours as needed for nausea or vomiting. 02/21/19   Curt Bears, MD     Family History  Problem Relation Age of Onset   Heart disease Mother        MI in her 37's   Stroke Mother    Heart disease Father        CABG   Hypertension Father    Heart disease Sister 43       CABG   Stroke Sister    Diabetes Sister     Social History   Socioeconomic History   Marital status: Widowed    Spouse name: Not on file   Number of children: 2   Years of education: Not on file   Highest education level: Not on file  Occupational History   Not on file  Social Needs   Financial resource strain: Not on file   Food insecurity    Worry: Not on file    Inability: Not on file   Transportation needs    Medical: Not on file    Non-medical: Not on file  Tobacco Use   Smoking status: Former Smoker    Packs/day: 1.00    Years: 60.00    Pack years: 60.00    Types: Cigarettes    Start date: 08/21/1955    Quit date: 01/28/2019    Years since quitting: 0.0   Smokeless tobacco: Never Used   Tobacco comment: 3 ppd for 20+ years, has cut down to 0.5 ppd, in the past 3 months, no cigs in 1 month  Substance and Sexual Activity   Alcohol use: Yes    Alcohol/week: 1.0 standard drinks    Types: 1 Cans of beer per week    Comment: occ   Drug use: No   Sexual activity: Not on file  Lifestyle   Physical activity    Days per week: Not on file    Minutes per  session: Not on file   Stress: Not on file  Relationships   Social connections    Talks on phone: Not on file    Gets together: Not on file    Attends religious service: Not on file    Active member of club or organization: Not on file    Attends meetings of clubs or organizations: Not on file    Relationship status: Not on file  Other Topics Concern   Not on file  Social History Narrative   Not on file     Review of Systems: A 12 point ROS discussed and pertinent positives are indicated in the HPI above.  All other systems are negative.  Review of Systems  Constitutional: Negative for fatigue and fever.  Respiratory: Positive for cough and shortness of breath.   Cardiovascular: Negative for chest pain.  Gastrointestinal: Negative for abdominal pain.  Musculoskeletal: Negative for back pain.  Psychiatric/Behavioral: Negative for behavioral problems and confusion.    Vital Signs: BP (!) 186/95    Pulse 95    Temp 97.9 F (36.6 C) (Oral)    Resp 20    SpO2 99%   Physical Exam Vitals signs and nursing note reviewed.  Constitutional:      Appearance: Normal appearance.  HENT:     Mouth/Throat:     Mouth: Mucous membranes are moist.     Pharynx: Oropharynx is clear.  Neck:     Musculoskeletal: Normal range of motion and neck supple.  Cardiovascular:     Rate and Rhythm: Normal rate and regular rhythm.  Pulmonary:     Effort: Pulmonary effort is normal.     Breath sounds: Normal breath sounds.  Skin:    General: Skin is warm and dry.  Neurological:     General: No focal deficit present.     Mental Status: She is alert and oriented to person, place, and time. Mental status is at baseline.  Psychiatric:        Mood and Affect: Mood normal.        Behavior: Behavior normal.        Thought Content: Thought content normal.        Judgment: Judgment normal.      MD Evaluation Airway: WNL Heart: WNL Abdomen: WNL Chest/ Lungs: WNL ASA  Classification:  3 Mallampati/Airway Score: One   Imaging: Mr Brain Wo Contrast  Result Date: 02/12/2019 CLINICAL DATA:  73 year old female with left 3rd nerve palsy for 6 days. History of lung cancer treated earlier this year. EXAM: MRI HEAD WITHOUT CONTRAST TECHNIQUE: Multiplanar, multiecho pulse sequences of the brain and surrounding structures were obtained without intravenous contrast. COMPARISON:  None. FINDINGS: Study is intermittently degraded by motion artifact despite repeated imaging attempts. Brain: There is a small focus of restricted diffusion in the inferior left occipital lobe white matter best seen on series 11, image 11. However, no brainstem diffusion abnormality is identified. Faint if any associated T2 and FLAIR hyperintensity in the left occipital lobe white matter. No associated hemorrhage or mass effect. However, the right temporal lobe is abnormal, with mass-like soft tissue thickening and architectural distortion most apparent on series 12, image 15 and series 5, image 10. There is associated mass effect on the right temporal horn, although no ventriculomegaly. However, the abnormal parenchyma appears largely isointense to other gray and white matter signal. It is unclear whether the some of the right insula might also be affected and abnormally thickened (series 10, image 50). No midline shift. Basilar cisterns remain normal. The deep gray matter nuclei brainstem and cerebellum appear normal. No cortical encephalomalacia or chronic cerebral blood products. Cervicomedullary junction within normal limits. Vascular: Partial loss of the distal left vertebral artery flow void (series 5, image 2). Other Major intracranial vascular flow voids are preserved. Skull and upper cervical spine: Negative visible cervical spine. Visualized bone marrow signal is within normal limits. Sinuses/Orbits: The pituitary gland also appears at the upper limits of normal, but the suprasellar cistern is preserved. Normal  noncontrast appearance of the cavernous sinuses. Postoperative changes to both globes, the intraorbital soft tissues appear normal. Paranasal sinuses are clear. Other: Mastoid air cells are clear. Visible internal auditory structures appear  normal. Scalp and face soft tissues appear negative. IMPRESSION: 1. Intermittently motion degraded study despite premedication and repeated imaging attempts. 2. The dominant abnormality is mass-like enlargement of the right temporal lobe, although the affected area appears largely isointense to normal brain parenchyma. The right insula and perhaps also the pituitary gland may be affected. Main differential considerations at this point are an infiltrative tumor, and cortical dysplasia (although the latter would seem unlikely if there is no chronic seizure history). Recommend follow-up Brain MRI with sedation (or anesthesia), done without and with IV contrast. 3. 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 No brainstem ischemia or abnormality identified. 4. 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 Electronically Signed   By: Genevie Ann M.D.   On: 02/12/2019 15:13   Mr Jeri Cos XW Contrast  Result Date: 02/13/2019 CLINICAL DATA:  Left ptosis and diplopia. History of non-small cell lung cancer with abnormal appearance of the right temporal lobe on a motion degraded noncontrast MRI yesterday. EXAM: MRI HEAD WITHOUT AND WITH CONTRAST TECHNIQUE: Multiplanar, multiecho pulse sequences of the brain and surrounding structures were obtained without and with intravenous contrast. CONTRAST:  5 mL Gadavist COMPARISON:  Noncontrast brain MRI 02/12/2019 FINDINGS: Brain: Abnormal appearance of the right temporal lobe described on yesterday's MRI is nonenhancing and on today's less motion degraded examination reflects extensive subcortical heterotopia throughout the mesial temporal lobe with dysmorphic right temporal horn. There is a 6 mm focus of ring enhancement in  the left occipital lobe which corresponds to the restricted diffusion seen on yesterday's study without significant vasogenic edema. No second enhancing brain lesion is identified. However, there is diffuse enlargement and heterogeneous enhancement of the pituitary gland which measures 12 mm in height and is contiguous with heterogeneously hypoenhancing tissue extending into the left cavernous sinus. The pituitary bulges into the suprasellar cistern, however there is no mass effect on the optic chiasm. No acute infarct, intracranial hemorrhage, midline shift, or extra-axial fluid collection is identified. A few scattered small foci of T2 hyperintensity in the cerebral white matter are nonspecific though may reflect minimal chronic small vessel ischemic disease. There is a small chronic cortical infarct posteriorly in the left frontal lobe. There is mild cerebral atrophy. Vascular: Major intracranial vascular flow voids are preserved. Skull and upper cervical spine: Unremarkable bone marrow signal. Sinuses/Orbits: Bilateral cataract extraction. Paranasal sinuses and mastoid air cells are clear. Other: None. IMPRESSION: 1. 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 2. 6 mm ring-enhancing lesion in the left occipital lobe with considerations including subacute infarct and metastasis. Consider short-term follow-up MRI in 6 weeks. 3. 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 Electronically Signed   By: Logan Bores M.D.   On: 02/13/2019 16:22   Ct Abdomen Pelvis W Contrast  Result Date: 01/28/2019 CLINICAL DATA:  Right upper lobe lung adenocarcinoma and putatively non-small cell left upper lobe lung cancer status post bilateral radiation therapy completed 09/12/2018. Extensive new mediastinal and partially visualized upper abdominal adenopathy on chest CT study. Patient presents for completion of restaging  evaluation. EXAM: CT ABDOMEN AND PELVIS WITH CONTRAST TECHNIQUE: Multidetector CT imaging of the abdomen and pelvis was performed using the standard protocol following bolus administration of intravenous contrast. CONTRAST:  135mL OMNIPAQUE IOHEXOL 300 MG/ML  SOLN COMPARISON:  01/16/2027 chest CT.  06/24/2018 PET-CT. FINDINGS: Lower chest: No acute abnormality at the lung bases. Coronary atherosclerosis. Multiple enlarged heterogeneously enhancing bilateral  posterior lower mediastinal lymph nodes, largest 2.4 cm on the right (series 2/image 12), stable since recent 01/16/2019 chest CT, all new since 06/24/2018 PET-CT. Hepatobiliary: Normal liver size. No liver mass. Normal gallbladder with no radiopaque cholelithiasis. No biliary ductal dilatation. Pancreas: Normal, with no mass or duct dilation. Spleen: Normal size. No mass. Adrenals/Urinary Tract: Left adrenal 1.6 cm nodule with density 79 HU, new since 06/24/2018 PET-CT. No right adrenal nodules. Mild right hydroureteronephrosis to the level of the right ureterovesical junction without obstructing stone or discrete obstructing mass. No left hydronephrosis. Contrast nephrograms are symmetric and normal. No renal masses. Mildly distended and otherwise normal bladder. Stomach/Bowel: Normal non-distended stomach. Normal caliber small bowel with no small bowel wall thickening. Normal appendix. Oral contrast transits to the distal colon. Moderate sigmoid diverticulosis, with no large bowel wall thickening or significant pericolonic fat stranding. Vascular/Lymphatic: Atherosclerotic nonaneurysmal abdominal aorta. Patent portal, splenic, hepatic and renal veins. There is extensive confluent adenopathy throughout the gastrohepatic ligament, porta hepatis, portacaval, aortocaval, left para-aortic and left common and external iliac nodal chains, demonstrating heterogeneous enhancement, all new since 06/24/2018 PET-CT. Representative 2.9 cm gastrohepatic ligament node  (series 2/image 21), which may directly invade the pancreatic body. Representative 2.3 cm porta hepatis node (series 2/image 20). Representative 2.2 cm aortocaval node (series 2/image 29). Representative 2.2 cm left para-aortic node (series 2/image 31). Representative 1.5 cm left common iliac node (series 2/image 42). Representative 1.5 cm left external iliac node (series 2/image 54). Reproductive: Status post hysterectomy, with no abnormal findings at the vaginal cuff. No adnexal mass. Other: No pneumoperitoneum, ascites or focal fluid collection. Musculoskeletal: No aggressive appearing focal osseous lesions. Moderate lumbar spondylosis. IMPRESSION: 1. Extensive confluent adenopathy throughout the posterior lower mediastinum, retroperitoneum and left pelvis, all new since 06/24/2018 PET-CT, compatible with metastatic disease. The heterogeneous enhancement within these nodes is more typical of metastatic carcinoma than lymphoma. 2. New left adrenal metastasis. 3. New mild right hydroureteronephrosis without discrete obstructing stone or mass. Contrast nephrograms are symmetric and normal, suggesting absence of high-grade obstruction. 4.  Aortic Atherosclerosis (ICD10-I70.0). Electronically Signed   By: Ilona Sorrel M.D.   On: 01/28/2019 17:29   Ct Biopsy  Result Date: 02/16/2019 INDICATION: History of small cell lung cancer, now with indeterminate retroperitoneal lymphadenopathy. Please perform CT-guided retroperitoneal lymph node biopsy for tissue diagnostic purposes. EXAM: CT-GUIDED BIOPSY OF INDETERMINATE LEFT-SIDED RETROPERITONEAL NODAL CONGLOMERATION COMPARISON:  CT abdomen and pelvis-01/28/2019 MEDICATIONS: None. ANESTHESIA/SEDATION: Fentanyl 100 mcg IV; Versed 2 mg IV Sedation time: 16 minutes; The patient was continuously monitored during the procedure by the interventional radiology nurse under my direct supervision. CONTRAST:  None. COMPLICATIONS: None immediate. PROCEDURE: Informed consent was  obtained from the patient following an explanation of the procedure, risks, benefits and alternatives. A time out was performed prior to the initiation of the procedure. The patient was positioned prone on the CT table and a limited CT was performed for procedural planning demonstrating unchanged size and appearance infiltrative left-sided retroperitoneal nodal conglomeration with dominant macrolobulated component measuring at least 4.3 x 3.5 cm (image 36, series 2). The procedure was planned. The operative site was prepped and draped in the usual sterile fashion. Appropriate trajectory was confirmed with a 22 gauge spinal needle after the adjacent tissues were anesthetized with 1% Lidocaine with epinephrine. Under intermittent CT guidance, a 17 gauge coaxial needle was advanced into the peripheral aspect of the mass. Appropriate positioning was confirmed and 6 core needle biopsy samples were obtained with an 18 gauge core needle  biopsy device. The co-axial needle was removed following the administration of a Gel-Foam slurry and hemostasis was achieved with manual compression. A limited postprocedural CT was negative for hemorrhage or additional complication. A dressing was placed. The patient tolerated the procedure well without immediate postprocedural complication. IMPRESSION: Technically successful CT guided core needle biopsy of indeterminate left-sided retroperitoneal nodal conglomeration. Electronically Signed   By: Sandi Mariscal M.D.   On: 02/16/2019 14:20    Labs:  CBC: Recent Labs    02/13/19 0424 02/14/19 0444 02/16/19 0500 02/19/19 1543  WBC 4.6 13.1* 9.1 10.2  HGB 14.3 11.7* 12.0 12.9  HCT 40.7 34.6* 37.3 39.2  PLT 284 319 331 373    COAGS: Recent Labs    07/08/18 1135 02/12/19 1351  INR 0.93 0.8  APTT 26 31    BMP: Recent Labs    02/13/19 1040 02/14/19 0444 02/16/19 0500 02/19/19 1543  NA 130* 136 144 139  K 4.1 4.6 3.8 3.7  CL 94* 101 108 101  CO2 21* 24 25 28     GLUCOSE 166* 139* 127* 158*  BUN 5* 7* 7* 15  CALCIUM 9.3 9.3 9.5 9.5  CREATININE 0.45 0.48 0.47 0.54  GFRNONAA >60 >60 >60 >60  GFRAA >60 >60 >60 >60    LIVER FUNCTION TESTS: Recent Labs    01/27/19 0625 02/12/19 1351 02/13/19 0424 02/19/19 1543  BILITOT 0.6 0.7 0.4 0.2*  AST 24 58* 40 21  ALT 20 11 11 22   ALKPHOS 66 71 65 60  PROT 7.5 7.0 6.8 6.9  ALBUMIN 4.4 3.8 3.6 4.1    TUMOR MARKERS: No results for input(s): AFPTM, CEA, CA199, CHROMGRNA in the last 8760 hours.  Assessment and Plan: Patient with past medical history of non small cell lung cancer presents with complaint of newly diagnosed small cell lung cancer.  She is in need of Port-A-Cath for treatment initiation.  Patient presents today in their usual state of health.  She has been NPO and is not currently on blood thinners.   Risks and benefits of image guided port-a-catheter placement was discussed with the patient including, but not limited to bleeding, infection, pneumothorax, or fibrin sheath development and need for additional procedures.  All of the patient's questions were answered, patient is agreeable to proceed. Consent signed and in chart.   Thank you for this interesting consult.  I greatly enjoyed meeting LILLER YOHN and look forward to participating in their care.  A copy of this report was sent to the requesting provider on this date.  Electronically Signed: Docia Barrier, PA 02/25/2019, 12:27 PM   I spent a total of  30 Minutes   in face to face in clinical consultation, greater than 50% of which was counseling/coordinating care for small cell lung cancer.

## 2019-02-25 NOTE — Procedures (Signed)
Met small cell lung ca  S/p RT IJ POWER PORT  Tip svcra No comp Stable ebl min Ready for use

## 2019-02-25 NOTE — Discharge Instructions (Addendum)
Implanted [facility] Guide An implanted port is a device that is placed under the skin. It is usually placed in the chest. The device can be used to give IV medicine, to take blood, or for dialysis. You may have an implanted port if:  You need IV medicine that would be irritating to the small veins in your hands or arms.  You need IV medicines, such as antibiotics, for a long period of time.  You need IV nutrition for a long period of time.  You need dialysis. Having a port means that your health care provider will not need to use the veins in your arms for these procedures. You may have fewer limitations when using a port than you would if you used other types of long-term IVs, and you will likely be able to return to normal activities after your incision heals. An implanted port has two main parts:  Reservoir. The reservoir is the part where a needle is inserted to give medicines or draw blood. The reservoir is round. After it is placed, it appears as a small, raised area under your skin.  Catheter. The catheter is a thin, flexible tube that connects the reservoir to a vein. Medicine that is inserted into the reservoir goes into the catheter and then into the vein. How is my port accessed? To access your port:  A numbing cream may be placed on the skin over the port site.  Your health care provider will put on a mask and sterile gloves.  The skin over your port will be cleaned carefully with a germ-killing soap and allowed to dry.  Your health care provider will gently pinch the port and insert a needle into it.  Your health care provider will check for a blood return to make sure the port is in the vein and is not clogged.  If your port needs to remain accessed to get medicine continuously (constant infusion), your health care provider will place a clear bandage (dressing) over the needle site. The dressing and needle will need to be changed every week, or as told by your health care  provider. What is flushing? Flushing helps keep the port from getting clogged. Follow instructions from your health care provider about how and when to flush the port. Ports are usually flushed with saline solution or a medicine called heparin. The need for flushing will depend on how the port is used:  If the port is only used from time to time to give medicines or draw blood, the port may need to be flushed: ? Before and after medicines have been given. ? Before and after blood has been drawn. ? As part of routine maintenance. Flushing may be recommended every 4-6 weeks.  If a constant infusion is running, the port may not need to be flushed.  Throw away any syringes in a disposal container that is meant for sharp items (sharps container). You can buy a sharps container from a pharmacy, or you can make one by using an empty hard plastic bottle with a cover. How long will my port stay implanted? The port can stay in for as long as your health care provider thinks it is needed. When it is time for the port to come out, a surgery will be done to remove it. The surgery will be similar to the procedure that was done to put the port in. Follow these instructions at home:   Flush your port as told by your health care provider.  If you need an infusion over several days, follow instructions from your health care provider about how to take care of your port site. Make sure you: ? Wash your hands with soap and water before you change your dressing. If soap and water are not available, use alcohol-based hand sanitizer. ? Change your dressing as told by your health care provider. ? Place any used dressings or infusion bags into a plastic bag. Throw that bag in the trash. ? Keep the dressing that covers the needle clean and dry. Do not get it wet. ? Do not use scissors or sharp objects near the tube. ? Keep the tube clamped, unless it is being used.  Check your port site every day for signs of  infection. Check for: ? Redness, swelling, or pain. ? Fluid or blood. ? Pus or a bad smell.  Protect the skin around the port site. ? Avoid wearing bra straps that rub or irritate the site. ? Protect the skin around your port from seat belts. Place a soft pad over your chest if needed.  Bathe or shower as told by your health care provider. The site may get wet as long as you are not actively receiving an infusion.  Return to your normal activities as told by your health care provider. Ask your health care provider what activities are safe for you.  Carry a medical alert card or wear a medical alert bracelet at all times. This will let health care providers know that you have an implanted port in case of an emergency. Get help right away if:  You have redness, swelling, or pain at the port site.  You have fluid or blood coming from your port site.  You have pus or a bad smell coming from the port site.  You have a fever. Summary  Implanted ports are usually placed in the chest for long-term IV access.  Follow instructions from your health care provider about flushing the port and changing bandages (dressings).  Take care of the area around your port by avoiding clothing that puts pressure on the area, and by watching for signs of infection.  Protect the skin around your port from seat belts. Place a soft pad over your chest if needed.  Get help right away if you have a fever or you have redness, swelling, pain, drainage, or a bad smell at the port site. This information is not intended to replace advice given to you by your health care provider. Make sure you discuss any questions you have with your health care provider. Document Released: 08/06/2005 Document Revised: 11/28/2018 Document Reviewed: 09/08/2016 Elsevier Patient Education  [place] Insertion, Care After This sheet gives you information about how to care for yourself after your procedure.  Your health care provider may also give you more specific instructions. If you have problems or questions, contact your health care provider. What can I expect after the procedure? After the procedure, it is common to have:  Discomfort at the port insertion site.  Bruising on the skin over the port. This should improve over 3-4 days. Follow these instructions at home: [facility] care  After your port is placed, you will get a manufacturer's information card. The card has information about your port. Keep this card with you at all times.  Take care of the port as told by your health care provider. Ask your health care provider if you or a family member can get training for taking care  of the port at home. A home health care nurse may also take care of the port.  Make sure to remember what type of port you have. Incision care      Follow instructions from your health care provider about how to take care of your port insertion site. Make sure you: ? Wash your hands with soap and water before and after you change your bandage (dressing). If soap and water are not available, use hand sanitizer. ? Change your dressing as told by your health care provider. ? Leave stitches (sutures), skin glue, or adhesive strips in place. These skin closures may need to stay in place for 2 weeks or longer. If adhesive strip edges start to loosen and curl up, you may trim the loose edges. Do not remove adhesive strips completely unless your health care provider tells you to do that.  Check your port insertion site every day for signs of infection. Check for: ? Redness, swelling, or pain. ? Fluid or blood. ? Warmth. ? Pus or a bad smell. Activity  Return to your normal activities as told by your health care provider. Ask your health care provider what activities are safe for you.  Do not lift anything that is heavier than 10 lb (4.5 kg), or the limit that you are told, until your health care provider says that it  is safe. General instructions  Take over-the-counter and prescription medicines only as told by your health care provider.  Do not take baths, swim, or use a hot tub until your health care provider approves. Ask your health care provider if you may take showers. You may only be allowed to take sponge baths.  Do not drive for 24 hours if you were given a sedative during your procedure.  Wear a medical alert bracelet in case of an emergency. This will tell any health care providers that you have a port.  Keep all follow-up visits as told by your health care provider. This is important. Contact a health care provider if:  You cannot flush your port with saline as directed, or you cannot draw blood from the port.  You have a fever or chills.  You have redness, swelling, or pain around your port insertion site.  You have fluid or blood coming from your port insertion site.  Your port insertion site feels warm to the touch.  You have pus or a bad smell coming from the port insertion site. Get help right away if:  You have chest pain or shortness of breath.  You have bleeding from your port that you cannot control. Summary  Take care of the port as told by your health care provider. Keep the manufacturer's information card with you at all times.  Change your dressing as told by your health care provider.  Contact a health care provider if you have a fever or chills or if you have redness, swelling, or pain around your port insertion site.  Keep all follow-up visits as told by your health care provider. This information is not intended to replace advice given to you by your health care provider. Make sure you discuss any questions you have with your health care provider. Document Released: 05/27/2013 Document Revised: 03/04/2018 Document Reviewed: 03/04/2018 Elsevier Patient Education  [place].      Moderate Conscious Sedation, Adult, Care After These instructions  provide you with information about caring for yourself after your procedure. Your health care provider may also give you more specific instructions.  Your treatment has been planned according to current medical practices, but problems sometimes occur. Call your health care provider if you have any problems or questions after your procedure. What can I expect after the procedure? After your procedure, it is common:  To feel sleepy for several hours.  To feel clumsy and have poor balance for several hours.  To have poor judgment for several hours.  To vomit if you eat too soon. Follow these instructions at home: For at least 24 hours after the procedure:   Do not: ? Participate in activities where you could fall or become injured. ? Drive. ? Use heavy machinery. ? Drink alcohol. ? Take sleeping pills or medicines that cause drowsiness. ? Make important decisions or sign legal documents. ? Take care of children on your own.  Rest. Eating and drinking  Follow the diet recommended by your health care provider.  If you vomit: ? Drink water, juice, or soup when you can drink without vomiting. ? Make sure you have little or no nausea before eating solid foods. General instructions  Have a responsible adult stay with you until you are awake and alert.  Take over-the-counter and prescription medicines only as told by your health care provider.  If you smoke, do not smoke without supervision.  Keep all follow-up visits as told by your health care provider. This is important. Contact a health care provider if:  You keep feeling nauseous or you keep vomiting.  You feel light-headed.  You develop a rash.  You have a fever. Get help right away if:  You have trouble breathing. This information is not intended to replace advice given to you by your health care provider. Make sure you discuss any questions you have with your health care provider. Document Released: 05/27/2013  Document Revised: 07/19/2017 Document Reviewed: 11/26/2015 Elsevier Patient Education  2020 [employer].

## 2019-02-26 ENCOUNTER — Other Ambulatory Visit: Payer: Self-pay | Admitting: Internal Medicine

## 2019-02-26 ENCOUNTER — Other Ambulatory Visit: Payer: Self-pay

## 2019-02-26 ENCOUNTER — Inpatient Hospital Stay: Payer: Medicare Other

## 2019-02-26 ENCOUNTER — Ambulatory Visit: Payer: Medicare Other

## 2019-02-26 ENCOUNTER — Ambulatory Visit
Admission: RE | Admit: 2019-02-26 | Discharge: 2019-02-26 | Disposition: A | Discharge status: Home / Self Care | Payer: Medicare Other | Source: Ambulatory Visit | Attending: Radiation Oncology | Admitting: Radiation Oncology

## 2019-02-26 DIAGNOSIS — E236 Other disorders of pituitary gland: Secondary | ICD-10-CM

## 2019-02-26 DIAGNOSIS — C3412 Malignant neoplasm of upper lobe, left bronchus or lung: Secondary | ICD-10-CM | POA: Insufficient documentation

## 2019-02-26 DIAGNOSIS — C3431 Malignant neoplasm of lower lobe, right bronchus or lung: Secondary | ICD-10-CM

## 2019-02-26 DIAGNOSIS — Z95828 Presence of other vascular implants and grafts: Secondary | ICD-10-CM | POA: Insufficient documentation

## 2019-02-26 DIAGNOSIS — Z5112 Encounter for antineoplastic immunotherapy: Secondary | ICD-10-CM | POA: Diagnosis not present

## 2019-02-26 LAB — URINALYSIS, COMPLETE (UACMP) WITH MICROSCOPIC
Bacteria, UA: NONE SEEN
Bilirubin Urine: NEGATIVE
Glucose, UA: NEGATIVE mg/dL
Hgb urine dipstick: NEGATIVE
Ketones, ur: NEGATIVE mg/dL
Leukocytes,Ua: NEGATIVE
Nitrite: NEGATIVE
Protein, ur: NEGATIVE mg/dL
Specific Gravity, Urine: 1.005 (ref 1.005–1.030)
pH: 6 (ref 5.0–8.0)

## 2019-02-26 LAB — CMP (CANCER CENTER ONLY)
ALT: 14 U/L (ref 0–44)
AST: 14 U/L — ABNORMAL LOW (ref 15–41)
Albumin: 3.7 g/dL (ref 3.5–5.0)
Alkaline Phosphatase: 62 U/L (ref 38–126)
Anion gap: 11 (ref 5–15)
BUN: 18 mg/dL (ref 8–23)
CO2: 26 mmol/L (ref 22–32)
Calcium: 9.3 mg/dL (ref 8.9–10.3)
Chloride: 104 mmol/L (ref 98–111)
Creatinine: 0.62 mg/dL (ref 0.44–1.00)
GFR, Est AFR Am: >60 mL/min (ref >60)
GFR, Estimated: >60 mL/min (ref >60)
Glucose, Bld: 122 mg/dL — ABNORMAL HIGH (ref 70–99)
Potassium: 4 mmol/L (ref 3.5–5.1)
Sodium: 141 mmol/L (ref 135–145)
Total Bilirubin: 0.5 mg/dL (ref 0.3–1.2)
Total Protein: 6.8 g/dL (ref 6.5–8.1)

## 2019-02-26 MED ORDER — SODIUM CHLORIDE 0.9% FLUSH
10.0000 mL | Freq: Once | INTRAVENOUS | Status: AC
  Administered 2019-02-26: 10 mL
  Filled 2019-02-26: qty 10

## 2019-02-26 MED ORDER — HEPARIN SOD (PORK) LOCK FLUSH 100 UNIT/ML IV SOLN
500.0000 [IU] | Freq: Once | INTRAVENOUS | Status: AC
  Administered 2019-02-26: 11:00:00 500 [IU]
  Filled 2019-02-26: qty 5

## 2019-02-27 ENCOUNTER — Telehealth: Payer: Self-pay | Admitting: *Deleted

## 2019-02-27 ENCOUNTER — Telehealth: Payer: Self-pay | Admitting: Internal Medicine

## 2019-02-27 ENCOUNTER — Inpatient Hospital Stay: Payer: Medicare Other | Admitting: Family Medicine

## 2019-02-27 ENCOUNTER — Other Ambulatory Visit: Payer: Self-pay | Admitting: Radiation Oncology

## 2019-02-27 DIAGNOSIS — E236 Other disorders of pituitary gland: Secondary | ICD-10-CM

## 2019-02-27 LAB — LUTEINIZING HORMONE: LH: 0.6 m[IU]/mL

## 2019-02-27 LAB — GROWTH HORMONE: Growth Hormone: 1.6 ng/mL (ref 0.0–10.0)

## 2019-02-27 LAB — THYROID PANEL WITH TSH
Free Thyroxine Index: 1 — ABNORMAL LOW (ref 1.2–4.9)
T3 Uptake Ratio: 23 % — ABNORMAL LOW (ref 24–39)
T4, Total: 4.5 ug/dL (ref 4.5–12.0)
TSH: 0.241 u[IU]/mL — ABNORMAL LOW (ref 0.450–4.500)

## 2019-02-27 LAB — TESTOSTERONE: Testosterone: <3 ng/dL — ABNORMAL LOW (ref 3–41)

## 2019-02-27 LAB — FOLLICLE STIMULATING HORMONE: FSH: 2.3 m[IU]/mL

## 2019-02-27 NOTE — Telephone Encounter (Signed)
Received call from pt's daughter regarding pt's upcoming appts. Pt will be starting chemo on Monday and her regime has chemo on Days 1,2 and 3 every 21 days. Pt has infusion appt for only Day 1. High priority scheduling message sent to schedule chemo on Tuesday and Wednesday. Pamela Russell was made aware of this.

## 2019-02-27 NOTE — Telephone Encounter (Signed)
Spoke with the patient daughter Josephina Shih) to let her know the results of her moms labs.  Informed her TSH was low and that her PCP should be made aware.  We plan to draw cortisol and ACTH labs on Tuesday.  I let her know that since her moms double vision has returned she should continue to take Dexamethasone 4 mg TID and if her vision should worsen she should go back to q6 hours.  She verbalized understanding.  Will continue to follow as necessary.  Gloriajean Dell. Leonie Green, BSN

## 2019-02-27 NOTE — Telephone Encounter (Signed)
Scheduled appt per 7/10 sch  - pt is aware of appt

## 2019-03-02 ENCOUNTER — Telehealth: Payer: Self-pay | Admitting: Internal Medicine

## 2019-03-02 ENCOUNTER — Inpatient Hospital Stay: Payer: Medicare Other

## 2019-03-02 ENCOUNTER — Other Ambulatory Visit: Payer: Self-pay | Admitting: Radiation Oncology

## 2019-03-02 ENCOUNTER — Other Ambulatory Visit: Payer: Self-pay

## 2019-03-02 ENCOUNTER — Telehealth: Payer: Self-pay | Admitting: Radiation Oncology

## 2019-03-02 ENCOUNTER — Telehealth: Payer: Self-pay | Admitting: *Deleted

## 2019-03-02 VITALS — BP 156/81 | HR 97 | Temp 98.0°F | Resp 20

## 2019-03-02 DIAGNOSIS — C3431 Malignant neoplasm of lower lobe, right bronchus or lung: Secondary | ICD-10-CM

## 2019-03-02 DIAGNOSIS — E236 Other disorders of pituitary gland: Secondary | ICD-10-CM

## 2019-03-02 DIAGNOSIS — Z5112 Encounter for antineoplastic immunotherapy: Secondary | ICD-10-CM | POA: Diagnosis not present

## 2019-03-02 MED ORDER — SODIUM CHLORIDE 0.9 % IV SOLN
Freq: Once | INTRAVENOUS | Status: AC
  Administered 2019-03-02: 09:00:00 via INTRAVENOUS
  Filled 2019-03-02: qty 5

## 2019-03-02 MED ORDER — SODIUM CHLORIDE 0.9 % IV SOLN
310.0000 mg | Freq: Once | INTRAVENOUS | Status: AC
  Administered 2019-03-02: 310 mg via INTRAVENOUS
  Filled 2019-03-02: qty 31

## 2019-03-02 MED ORDER — PALONOSETRON HCL INJECTION 0.25 MG/5ML
INTRAVENOUS | Status: AC
  Filled 2019-03-02: qty 5

## 2019-03-02 MED ORDER — PALONOSETRON HCL INJECTION 0.25 MG/5ML
0.2500 mg | Freq: Once | INTRAVENOUS | Status: AC
  Administered 2019-03-02: 0.25 mg via INTRAVENOUS

## 2019-03-02 MED ORDER — SODIUM CHLORIDE 0.9 % IV SOLN
Freq: Once | INTRAVENOUS | Status: AC
  Administered 2019-03-02: 09:00:00 via INTRAVENOUS
  Filled 2019-03-02: qty 250

## 2019-03-02 MED ORDER — HEPARIN SOD (PORK) LOCK FLUSH 100 UNIT/ML IV SOLN
500.0000 [IU] | Freq: Once | INTRAVENOUS | Status: AC | PRN
  Administered 2019-03-02: 500 [IU]
  Filled 2019-03-02: qty 5

## 2019-03-02 MED ORDER — SODIUM CHLORIDE 0.9 % IV SOLN
1500.0000 mg | Freq: Once | INTRAVENOUS | Status: AC
  Administered 2019-03-02: 1500 mg via INTRAVENOUS
  Filled 2019-03-02: qty 30

## 2019-03-02 MED ORDER — SODIUM CHLORIDE 0.9% FLUSH
10.0000 mL | INTRAVENOUS | Status: DC | PRN
  Administered 2019-03-02: 10 mL
  Filled 2019-03-02: qty 10

## 2019-03-02 MED ORDER — SODIUM CHLORIDE 0.9 % IV SOLN
100.0000 mg/m2 | Freq: Once | INTRAVENOUS | Status: AC
  Administered 2019-03-02: 140 mg via INTRAVENOUS
  Filled 2019-03-02: qty 7

## 2019-03-02 NOTE — Telephone Encounter (Signed)
Added injection 7/17 per 7/13 schedule message. Left message for patient.

## 2019-03-02 NOTE — Telephone Encounter (Signed)
Daughter lm on nurse line because pts TSH came back low by oncology.  She would like to speak with Dr. Tammi Klippel about this.     No answer and no machine.  Will await callback.\  Christen Bame, CMA

## 2019-03-02 NOTE — Patient Instructions (Signed)
[place] [facility] Discharge Instructions for Patients Receiving Chemotherapy  Today you received the following chemotherapy agents :  Durvalumab,   Carboplatin,  Etoposide.  To help prevent nausea and vomiting after your treatment, we encourage you to take your nausea medication as prescribed.  TAKE COMPAZINE 10 MG BY MOUTH EVERY 6 HOURS AS NEEDED FOR NAUSEA. DO NOT DRIVE AFTER TAKING THIS MEDICATION AS IT CAN CAUSE DROWSINESS.  DO DRINK LOTS OF FLUIDS AS TOLERATED.  DO NOT EAT GREASY NOR SPICY FOODS.   If you develop nausea and vomiting that is not controlled by your nausea medication, call the clinic.   BELOW ARE SYMPTOMS THAT SHOULD BE REPORTED IMMEDIATELY:  *FEVER GREATER THAN 100.5 F  *CHILLS WITH OR WITHOUT FEVER  NAUSEA AND VOMITING THAT IS NOT CONTROLLED WITH YOUR NAUSEA MEDICATION  *UNUSUAL SHORTNESS OF BREATH  *UNUSUAL BRUISING OR BLEEDING  TENDERNESS IN MOUTH AND THROAT WITH OR WITHOUT PRESENCE OF ULCERS  *URINARY PROBLEMS  *BOWEL PROBLEMS  UNUSUAL RASH Items with * indicate a potential emergency and should be followed up as soon as possible.  Feel free to call the clinic should you have any questions or concerns. The clinic phone number is [phone].  Please show the [place] at check-in to the Emergency Department and triage nurse.

## 2019-03-02 NOTE — Telephone Encounter (Signed)
I called the pt's daughter back. We reviewed her mom's thyroid studies and while not overwhelmingly low TSH, she is symptomatic and has had increasing aggressiveness, anxiety, and in retrospect thinning of her hair, itchiness of skin, and some temperature intolerance. She had some ptosis over the weekend and they increased her dexamethasone up to 4 mg TID from 4 mg BID. I also spoke with Dr. Mickeal Skinner and our plan is to try to decrease her dexamethasone dose back to 4 mg BID, and see if this improves her aggressive outbursts. Dr. Mickeal Skinner is suspicious that the ptosis may not be steroid dependant. Hopefully her chemotherapy regimen will also have some CNS penetration and our plan would be to complete cycle #2, reimage, and then proceed with XRT depending on the findings. I called Sissy back and let her know the recommendations. Dr. Mickeal Skinner can see her if these modifications don't help, and Sissy will reach out to the PCP to see if they will manage her clinical hyperthyroidism.

## 2019-03-03 ENCOUNTER — Telehealth: Payer: Self-pay | Admitting: Emergency Medicine

## 2019-03-03 ENCOUNTER — Ambulatory Visit
Admission: RE | Admit: 2019-03-03 | Discharge: 2019-03-03 | Disposition: A | Discharge status: Home / Self Care | Payer: Medicare Other | Source: Ambulatory Visit | Attending: Radiation Oncology | Admitting: Radiation Oncology

## 2019-03-03 ENCOUNTER — Other Ambulatory Visit: Payer: Self-pay

## 2019-03-03 ENCOUNTER — Inpatient Hospital Stay: Payer: Medicare Other

## 2019-03-03 VITALS — BP 157/80 | HR 91 | Temp 98.7°F | Resp 20

## 2019-03-03 DIAGNOSIS — E236 Other disorders of pituitary gland: Secondary | ICD-10-CM

## 2019-03-03 DIAGNOSIS — C3431 Malignant neoplasm of lower lobe, right bronchus or lung: Secondary | ICD-10-CM

## 2019-03-03 DIAGNOSIS — C3412 Malignant neoplasm of upper lobe, left bronchus or lung: Secondary | ICD-10-CM

## 2019-03-03 DIAGNOSIS — Z5112 Encounter for antineoplastic immunotherapy: Secondary | ICD-10-CM | POA: Diagnosis not present

## 2019-03-03 LAB — CORTISOL-AM, BLOOD: Cortisol - AM: 2.3 ug/dL — ABNORMAL LOW (ref 6.7–22.6)

## 2019-03-03 MED ORDER — SODIUM CHLORIDE 0.9% FLUSH
10.0000 mL | INTRAVENOUS | Status: DC | PRN
  Administered 2019-03-03: 10 mL
  Filled 2019-03-03: qty 10

## 2019-03-03 MED ORDER — SODIUM CHLORIDE 0.9 % IV SOLN
100.0000 mg/m2 | Freq: Once | INTRAVENOUS | Status: AC
  Administered 2019-03-03: 140 mg via INTRAVENOUS
  Filled 2019-03-03: qty 7

## 2019-03-03 MED ORDER — HEPARIN SOD (PORK) LOCK FLUSH 100 UNIT/ML IV SOLN
500.0000 [IU] | Freq: Once | INTRAVENOUS | Status: AC | PRN
  Administered 2019-03-03: 500 [IU]
  Filled 2019-03-03: qty 5

## 2019-03-03 MED ORDER — SODIUM CHLORIDE 0.9 % IV SOLN
Freq: Once | INTRAVENOUS | Status: AC
  Administered 2019-03-03: 14:00:00 via INTRAVENOUS
  Filled 2019-03-03: qty 250

## 2019-03-03 MED ORDER — DEXAMETHASONE SODIUM PHOSPHATE 10 MG/ML IJ SOLN
INTRAMUSCULAR | Status: AC
  Filled 2019-03-03: qty 1

## 2019-03-03 MED ORDER — DEXAMETHASONE SODIUM PHOSPHATE 10 MG/ML IJ SOLN
10.0000 mg | Freq: Once | INTRAMUSCULAR | Status: AC
  Administered 2019-03-03: 10 mg via INTRAVENOUS

## 2019-03-03 NOTE — Telephone Encounter (Signed)
First time chemo f/u call after pt received Durvalumab, Carbo, Etoposide on 7/13 (and only Etoposide for today's visit).  Pt denies any questions or concerns at this time and verbalized understanding that she can call back at any time if needed.

## 2019-03-03 NOTE — Telephone Encounter (Signed)
Spoke to [name] over the phone. Was able to set up telephone visit on 7/17 PM. Patient and daughter will be on the call. Will send message to Dr. Sandi Carne who will see patient on Friday.   Pamela Russell, PGY-3 [facility] 03/03/2019 11:28 AM

## 2019-03-03 NOTE — Patient Instructions (Signed)
[place] [facility] Discharge Instructions for Patients Receiving Chemotherapy  Today you received the following chemotherapy agents :  Etoposide.  To help prevent nausea and vomiting after your treatment, we encourage you to take your nausea medication as prescribed.  TAKE COMPAZINE 10 MG BY MOUTH EVERY 6 HOURS AS NEEDED FOR NAUSEA. DO NOT DRIVE AFTER TAKING THIS MEDICATION AS IT CAN CAUSE DROWSINESS.  DO DRINK LOTS OF FLUIDS AS TOLERATED.  DO NOT EAT GREASY NOR SPICY FOODS.   If you develop nausea and vomiting that is not controlled by your nausea medication, call the clinic.   BELOW ARE SYMPTOMS THAT SHOULD BE REPORTED IMMEDIATELY:  *FEVER GREATER THAN 100.5 F  *CHILLS WITH OR WITHOUT FEVER  NAUSEA AND VOMITING THAT IS NOT CONTROLLED WITH YOUR NAUSEA MEDICATION  *UNUSUAL SHORTNESS OF BREATH  *UNUSUAL BRUISING OR BLEEDING  TENDERNESS IN MOUTH AND THROAT WITH OR WITHOUT PRESENCE OF ULCERS  *URINARY PROBLEMS  *BOWEL PROBLEMS  UNUSUAL RASH Items with * indicate a potential emergency and should be followed up as soon as possible.  Feel free to call the clinic should you have any questions or concerns. The clinic phone number is [phone].  Please show the [place] at check-in to the Emergency Department and triage nurse.

## 2019-03-04 ENCOUNTER — Inpatient Hospital Stay: Payer: Medicare Other

## 2019-03-04 ENCOUNTER — Other Ambulatory Visit: Payer: Self-pay

## 2019-03-04 VITALS — BP 178/79 | HR 98 | Temp 98.3°F | Resp 20

## 2019-03-04 DIAGNOSIS — Z5112 Encounter for antineoplastic immunotherapy: Secondary | ICD-10-CM | POA: Diagnosis not present

## 2019-03-04 DIAGNOSIS — C3431 Malignant neoplasm of lower lobe, right bronchus or lung: Secondary | ICD-10-CM

## 2019-03-04 LAB — ACTH: C206 ACTH: 19.1 pg/mL (ref 7.2–63.3)

## 2019-03-04 MED ORDER — HEPARIN SOD (PORK) LOCK FLUSH 100 UNIT/ML IV SOLN
500.0000 [IU] | Freq: Once | INTRAVENOUS | Status: AC | PRN
  Administered 2019-03-04: 500 [IU]
  Filled 2019-03-04: qty 5

## 2019-03-04 MED ORDER — DEXAMETHASONE SODIUM PHOSPHATE 10 MG/ML IJ SOLN
INTRAMUSCULAR | Status: AC
  Filled 2019-03-04: qty 1

## 2019-03-04 MED ORDER — SODIUM CHLORIDE 0.9% FLUSH
10.0000 mL | INTRAVENOUS | Status: DC | PRN
  Administered 2019-03-04: 10 mL
  Filled 2019-03-04: qty 10

## 2019-03-04 MED ORDER — DEXAMETHASONE SODIUM PHOSPHATE 10 MG/ML IJ SOLN
10.0000 mg | Freq: Once | INTRAMUSCULAR | Status: AC
  Administered 2019-03-04: 10 mg via INTRAVENOUS

## 2019-03-04 MED ORDER — SODIUM CHLORIDE 0.9 % IV SOLN
100.0000 mg/m2 | Freq: Once | INTRAVENOUS | Status: AC
  Administered 2019-03-04: 140 mg via INTRAVENOUS
  Filled 2019-03-04: qty 7

## 2019-03-04 MED ORDER — SODIUM CHLORIDE 0.9 % IV SOLN
Freq: Once | INTRAVENOUS | Status: AC
  Administered 2019-03-04: 08:00:00 via INTRAVENOUS
  Filled 2019-03-04: qty 250

## 2019-03-04 NOTE — Patient Instructions (Addendum)
[facility] Discharge Instructions for Patients Receiving Chemotherapy  Today you received the following chemotherapy agents :  Etoposide.  To help prevent nausea and vomiting after your treatment, we encourage you to take your nausea medication as prescribed.  TAKE COMPAZINE 10 MG BY MOUTH EVERY 6 HOURS AS NEEDED FOR NAUSEA. DO NOT DRIVE AFTER TAKING THIS MEDICATION AS IT CAN CAUSE DROWSINESS.  DO DRINK LOTS OF FLUIDS AS TOLERATED.  DO NOT EAT GREASY NOR SPICY FOODS.   If you develop nausea and vomiting that is not controlled by your nausea medication, call the clinic.   BELOW ARE SYMPTOMS THAT SHOULD BE REPORTED IMMEDIATELY:  *FEVER GREATER THAN 100.5 F  *CHILLS WITH OR WITHOUT FEVER  NAUSEA AND VOMITING THAT IS NOT CONTROLLED WITH YOUR NAUSEA MEDICATION  *UNUSUAL SHORTNESS OF BREATH  *UNUSUAL BRUISING OR BLEEDING  TENDERNESS IN MOUTH AND THROAT WITH OR WITHOUT PRESENCE OF ULCERS  *URINARY PROBLEMS  *BOWEL PROBLEMS  UNUSUAL RASH Items with * indicate a potential emergency and should be followed up as soon as possible.  Feel free to call the clinic should you have any questions or concerns. The clinic phone number is [phone].  Please show the [facility] at check-in to the Emergency Department and triage nurse.   Implanted [facility] Guide An implanted port is a device that is placed under the skin. It is usually placed in the chest. The device can be used to give IV medicine, to take blood, or for dialysis. You may have an implanted port if:  You need IV medicine that would be irritating to the small veins in your hands or arms.  You need IV medicines, such as antibiotics, for a long period of time.  You need IV nutrition for a long period of time.  You need dialysis. Having a port means that your health care provider will not need to use the veins in your arms for these procedures. You may have fewer limitations when using a port than you  would if you used other types of long-term IVs, and you will likely be able to return to normal activities after your incision heals. An implanted port has two main parts:  Reservoir. The reservoir is the part where a needle is inserted to give medicines or draw blood. The reservoir is round. After it is placed, it appears as a small, raised area under your skin.  Catheter. The catheter is a thin, flexible tube that connects the reservoir to a vein. Medicine that is inserted into the reservoir goes into the catheter and then into the vein. How is my port accessed? To access your port:  A numbing cream may be placed on the skin over the port site.  Your health care provider will put on a mask and sterile gloves.  The skin over your port will be cleaned carefully with a germ-killing soap and allowed to dry.  Your health care provider will gently pinch the port and insert a needle into it.  Your health care provider will check for a blood return to make sure the port is in the vein and is not clogged.  If your port needs to remain accessed to get medicine continuously (constant infusion), your health care provider will place a clear bandage (dressing) over the needle site. The dressing and needle will need to be changed every week, or as told by your health care provider. What is flushing? Flushing helps keep the port from getting clogged. Follow  instructions from your health care provider about how and when to flush the port. Ports are usually flushed with saline solution or a medicine called heparin. The need for flushing will depend on how the port is used:  If the port is only used from time to time to give medicines or draw blood, the port may need to be flushed: ? Before and after medicines have been given. ? Before and after blood has been drawn. ? As part of routine maintenance. Flushing may be recommended every 4-6 weeks.  If a constant infusion is running, the port may not need to  be flushed.  Throw away any syringes in a disposal container that is meant for sharp items (sharps container). You can buy a sharps container from a pharmacy, or you can make one by using an empty hard plastic bottle with a cover. How long will my port stay implanted? The port can stay in for as long as your health care provider thinks it is needed. When it is time for the port to come out, a surgery will be done to remove it. The surgery will be similar to the procedure that was done to put the port in. Follow these instructions at home:   Flush your port as told by your health care provider.  If you need an infusion over several days, follow instructions from your health care provider about how to take care of your port site. Make sure you: ? Wash your hands with soap and water before you change your dressing. If soap and water are not available, use alcohol-based hand sanitizer. ? Change your dressing as told by your health care provider. ? Place any used dressings or infusion bags into a plastic bag. Throw that bag in the trash. ? Keep the dressing that covers the needle clean and dry. Do not get it wet. ? Do not use scissors or sharp objects near the tube. ? Keep the tube clamped, unless it is being used.  Check your port site every day for signs of infection. Check for: ? Redness, swelling, or pain. ? Fluid or blood. ? Pus or a bad smell.  Protect the skin around the port site. ? Avoid wearing bra straps that rub or irritate the site. ? Protect the skin around your port from seat belts. Place a soft pad over your chest if needed.  Bathe or shower as told by your health care provider. The site may get wet as long as you are not actively receiving an infusion.  Return to your normal activities as told by your health care provider. Ask your health care provider what activities are safe for you.  Carry a medical alert card or wear a medical alert bracelet at all times. This will let  health care providers know that you have an implanted port in case of an emergency. Get help right away if:  You have redness, swelling, or pain at the port site.  You have fluid or blood coming from your port site.  You have pus or a bad smell coming from the port site.  You have a fever. Summary  Implanted ports are usually placed in the chest for long-term IV access.  Follow instructions from your health care provider about flushing the port and changing bandages (dressings).  Take care of the area around your port by avoiding clothing that puts pressure on the area, and by watching for signs of infection.  Protect the skin around your port from seat  belts. Place a soft pad over your chest if needed.  Get help right away if you have a fever or you have redness, swelling, pain, drainage, or a bad smell at the port site. This information is not intended to replace advice given to you by your health care provider. Make sure you discuss any questions you have with your health care provider. Document Released: 08/06/2005 Document Revised: 11/28/2018 Document Reviewed: 09/08/2016 Elsevier Patient Education  2020 [employer].

## 2019-03-05 ENCOUNTER — Telehealth: Payer: Self-pay | Admitting: Radiation Oncology

## 2019-03-05 ENCOUNTER — Other Ambulatory Visit: Payer: Self-pay | Admitting: Radiation Therapy

## 2019-03-05 DIAGNOSIS — C7949 Secondary malignant neoplasm of other parts of nervous system: Secondary | ICD-10-CM

## 2019-03-05 DIAGNOSIS — C7931 Secondary malignant neoplasm of brain: Secondary | ICD-10-CM

## 2019-03-05 NOTE — Telephone Encounter (Signed)
I spoke with the patient's daughter and we will plan to repeat MRI with GA at [facility] the week of 8/17, the week after her 2nd cycle has been completed. We will determine WB XRT at that time versus other options. Her daughter is in agreement. 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 I let her know that we believe her am cortisol to be low due to her dexamethasone rather that abnormal fxn of her pituitary gland. She is in agreement with this plan.

## 2019-03-06 ENCOUNTER — Other Ambulatory Visit: Payer: Self-pay

## 2019-03-06 ENCOUNTER — Inpatient Hospital Stay: Payer: Medicare Other

## 2019-03-06 ENCOUNTER — Telehealth (INDEPENDENT_AMBULATORY_CARE_PROVIDER_SITE_OTHER): Payer: Medicare Other | Admitting: Family Medicine

## 2019-03-06 ENCOUNTER — Telehealth: Payer: Self-pay | Admitting: Radiation Therapy

## 2019-03-06 DIAGNOSIS — R7989 Other specified abnormal findings of blood chemistry: Secondary | ICD-10-CM | POA: Diagnosis not present

## 2019-03-06 DIAGNOSIS — Z5112 Encounter for antineoplastic immunotherapy: Secondary | ICD-10-CM | POA: Diagnosis not present

## 2019-03-06 DIAGNOSIS — C3431 Malignant neoplasm of lower lobe, right bronchus or lung: Secondary | ICD-10-CM

## 2019-03-06 MED ORDER — PEGFILGRASTIM-CBQV 6 MG/0.6ML ~~LOC~~ SOSY
PREFILLED_SYRINGE | SUBCUTANEOUS | Status: AC
  Filled 2019-03-06: qty 0.6

## 2019-03-06 MED ORDER — PEGFILGRASTIM-JMDB 6 MG/0.6ML ~~LOC~~ SOSY: 6.0000 mg | PREFILLED_SYRINGE | Freq: Once | SUBCUTANEOUS | Status: DC

## 2019-03-06 MED ORDER — PEGFILGRASTIM-CBQV 6 MG/0.6ML ~~LOC~~ SOSY
6.0000 mg | PREFILLED_SYRINGE | Freq: Once | SUBCUTANEOUS | Status: AC
  Administered 2019-03-06: 6 mg via SUBCUTANEOUS

## 2019-03-06 NOTE — Progress Notes (Signed)
[place] Telemedicine Visit  Patient consented to have virtual visit. Method of visit: Telephone  Encounter participants: Patient: Pamela Russell - located at home Provider: Cleophas Dunker - located at [facility] Others (if applicable): Sissy, patient's daughter  Chief Complaint: Abnormal Thyroid Tests  HPI:  Patient is currently being treated for [place] and oncologist recently obtained thyroid studies.  These were abnormal showing a low TSH at 0.241, low T3 at 23, low free thyroxine index at 1.0.  Patient reports that she was advised by her oncologist to follow-up with her primary care provider regarding these results.  She notes that she has been having some diffuse itching, which her oncologist thinks may be related to a thyroid issue.  She has been having some weight loss, but also notes that she is currently receiving chemotherapy.  She denies heart palpitations.  ROS: per HPI  Pertinent PMHx: Small cell lung cancer, hypertension, COPD, hyperlipidemia  Exam:  Respiratory: Speaking in complete sentences on the phone without evidence of respiratory distress  Assessment/Plan:  Abnormal TSH Per chart review, patient has not had abnormal thyroid levels in the past.  TSH is low, T3 only mildly reduced.  Patient is currently on Decadron, which can cause these changes in TFTs.  Patient and her sister were advised of this, and note that at this time it would be most appropriate to repeat these levels in 4 to 6 weeks.  They request that these be performed at oncologist office.  Order placed for future lab order, but advised to speak with oncologist about this to see if they can be obtained at 1 of her appointments with them.    Time spent during visit with patient: 12 minutes

## 2019-03-06 NOTE — Telephone Encounter (Signed)
Left a detailed telephone message about her upcoming brain MRI w/ sedation in August and about the required Covid test due to intubation during the scan. Included my contact information and requested a call back to confirm understanding of her upcoming visits.  Mont Dutton R.T.(R)(T) Special Procedures Navigator

## 2019-03-08 DIAGNOSIS — R7989 Other specified abnormal findings of blood chemistry: Secondary | ICD-10-CM | POA: Insufficient documentation

## 2019-03-08 NOTE — Assessment & Plan Note (Signed)
Per chart review, patient has not had abnormal thyroid levels in the past.  TSH is low, T3 only mildly reduced.  Patient is currently on Decadron, which can cause these changes in TFTs.  Patient and her sister were advised of this, and note that at this time it would be most appropriate to repeat these levels in 4 to 6 weeks.  They request that these be performed at oncologist office.  Order placed for future lab order, but advised to speak with oncologist about this to see if they can be obtained at 1 of her appointments with them.

## 2019-03-09 ENCOUNTER — Inpatient Hospital Stay: Payer: Medicare Other

## 2019-03-09 ENCOUNTER — Inpatient Hospital Stay (HOSPITAL_BASED_OUTPATIENT_CLINIC_OR_DEPARTMENT_OTHER): Payer: Medicare Other | Admitting: Internal Medicine

## 2019-03-09 ENCOUNTER — Encounter: Payer: Self-pay | Admitting: Internal Medicine

## 2019-03-09 ENCOUNTER — Other Ambulatory Visit: Payer: Self-pay

## 2019-03-09 VITALS — BP 170/73 | HR 95 | Temp 97.3°F | Resp 18 | Ht 64.02 in | Wt 94.3 lb

## 2019-03-09 DIAGNOSIS — C3411 Malignant neoplasm of upper lobe, right bronchus or lung: Secondary | ICD-10-CM

## 2019-03-09 DIAGNOSIS — C7972 Secondary malignant neoplasm of left adrenal gland: Secondary | ICD-10-CM

## 2019-03-09 DIAGNOSIS — C3412 Malignant neoplasm of upper lobe, left bronchus or lung: Secondary | ICD-10-CM | POA: Diagnosis not present

## 2019-03-09 DIAGNOSIS — Z5112 Encounter for antineoplastic immunotherapy: Secondary | ICD-10-CM

## 2019-03-09 DIAGNOSIS — Z7982 Long term (current) use of aspirin: Secondary | ICD-10-CM

## 2019-03-09 DIAGNOSIS — Z79899 Other long term (current) drug therapy: Secondary | ICD-10-CM

## 2019-03-09 DIAGNOSIS — Z923 Personal history of irradiation: Secondary | ICD-10-CM

## 2019-03-09 DIAGNOSIS — F419 Anxiety disorder, unspecified: Secondary | ICD-10-CM

## 2019-03-09 DIAGNOSIS — Z7951 Long term (current) use of inhaled steroids: Secondary | ICD-10-CM

## 2019-03-09 DIAGNOSIS — C3431 Malignant neoplasm of lower lobe, right bronchus or lung: Secondary | ICD-10-CM

## 2019-03-09 DIAGNOSIS — Z95828 Presence of other vascular implants and grafts: Secondary | ICD-10-CM

## 2019-03-09 DIAGNOSIS — Z5111 Encounter for antineoplastic chemotherapy: Secondary | ICD-10-CM

## 2019-03-09 DIAGNOSIS — F17210 Nicotine dependence, cigarettes, uncomplicated: Secondary | ICD-10-CM | POA: Diagnosis not present

## 2019-03-09 DIAGNOSIS — I10 Essential (primary) hypertension: Secondary | ICD-10-CM

## 2019-03-09 DIAGNOSIS — J449 Chronic obstructive pulmonary disease, unspecified: Secondary | ICD-10-CM

## 2019-03-09 DIAGNOSIS — E785 Hyperlipidemia, unspecified: Secondary | ICD-10-CM

## 2019-03-09 LAB — CBC WITH DIFFERENTIAL (CANCER CENTER ONLY)
Abs Immature Granulocytes: 0.91 10*3/uL — ABNORMAL HIGH (ref 0.00–0.07)
Basophils Absolute: 0.1 10*3/uL (ref 0.0–0.1)
Basophils Relative: 1 %
Eosinophils Absolute: 0 10*3/uL (ref 0.0–0.5)
Eosinophils Relative: 0 %
HCT: 34 % — ABNORMAL LOW (ref 36.0–46.0)
Hemoglobin: 11.4 g/dL — ABNORMAL LOW (ref 12.0–15.0)
Immature Granulocytes: 7 %
Lymphocytes Relative: 4 %
Lymphs Abs: 0.6 10*3/uL — ABNORMAL LOW (ref 0.7–4.0)
MCH: 31.3 pg (ref 26.0–34.0)
MCHC: 33.5 g/dL (ref 30.0–36.0)
MCV: 93.4 fL (ref 80.0–100.0)
Monocytes Absolute: 0.3 10*3/uL (ref 0.1–1.0)
Monocytes Relative: 3 %
Neutro Abs: 10.8 10*3/uL — ABNORMAL HIGH (ref 1.7–7.7)
Neutrophils Relative %: 85 %
Platelet Count: 217 10*3/uL (ref 150–400)
RBC: 3.64 MIL/uL — ABNORMAL LOW (ref 3.87–5.11)
RDW: 15.9 % — ABNORMAL HIGH (ref 11.5–15.5)
WBC Count: 12.7 10*3/uL — ABNORMAL HIGH (ref 4.0–10.5)
nRBC: 0 % (ref 0.0–0.2)

## 2019-03-09 LAB — CMP (CANCER CENTER ONLY)
ALT: 15 U/L (ref 0–44)
AST: 13 U/L — ABNORMAL LOW (ref 15–41)
Albumin: 3.6 g/dL (ref 3.5–5.0)
Alkaline Phosphatase: 107 U/L (ref 38–126)
Anion gap: 10 (ref 5–15)
BUN: 11 mg/dL (ref 8–23)
CO2: 26 mmol/L (ref 22–32)
Calcium: 8.9 mg/dL (ref 8.9–10.3)
Chloride: 103 mmol/L (ref 98–111)
Creatinine: 0.56 mg/dL (ref 0.44–1.00)
GFR, Est AFR Am: >60 mL/min (ref >60)
GFR, Estimated: >60 mL/min (ref >60)
Glucose, Bld: 134 mg/dL — ABNORMAL HIGH (ref 70–99)
Potassium: 3.9 mmol/L (ref 3.5–5.1)
Sodium: 139 mmol/L (ref 135–145)
Total Bilirubin: 0.4 mg/dL (ref 0.3–1.2)
Total Protein: 6.5 g/dL (ref 6.5–8.1)

## 2019-03-09 LAB — TSH: TSH: 1.075 u[IU]/mL (ref 0.308–3.960)

## 2019-03-09 MED ORDER — CLONIDINE HCL 0.1 MG PO TABS
0.1000 mg | ORAL_TABLET | Freq: Once | ORAL | Status: AC
  Administered 2019-03-09: 0.1 mg via ORAL

## 2019-03-09 MED ORDER — HEPARIN SOD (PORK) LOCK FLUSH 100 UNIT/ML IV SOLN
500.0000 [IU] | Freq: Once | INTRAVENOUS | Status: AC
  Administered 2019-03-09: 500 [IU]
  Filled 2019-03-09: qty 5

## 2019-03-09 MED ORDER — SODIUM CHLORIDE 0.9% FLUSH
10.0000 mL | Freq: Once | INTRAVENOUS | Status: AC
  Administered 2019-03-09: 10:00:00 10 mL
  Filled 2019-03-09: qty 10

## 2019-03-09 MED ORDER — CLONIDINE HCL 0.1 MG PO TABS
ORAL_TABLET | ORAL | Status: AC
  Filled 2019-03-09: qty 1

## 2019-03-09 NOTE — Patient Instructions (Signed)

## 2019-03-09 NOTE — Progress Notes (Signed)
[facility] [phone]   Fax:(336) [place], [place], [place] [address]  DIAGNOSIS:  1) extensive stage small cell lung cancer presenting with mediastinal lymphadenopathy in addition to abdominal lymphadenopathy as well as adrenal metastasis diagnosed in June 2020. 2)  History of bilateral adenocarcinoma of the lung presenting with right upper lobe and left upper lobe pulmonary nodules  PRIOR THERAPY: status post curative SBRT completed in January 2020.  CURRENT THERAPY: Systemic chemotherapy with carboplatin for AUC of 5 on day 1, etoposide 100 mg/M2 and Imfinzi 1500 mg IV every 3 weeks.  Status post 1 cycle.  INTERVAL HISTORY: Pamela Russell 73 y.o. female returns to the clinic today for follow-up visit.  The patient is feeling fine today with no concerning complaints except for back pain started after the Neulasta injection.  She denied having any current chest pain, shortness of breath, cough or hemoptysis.  She denied having any fever or chills.  She has no nausea, vomiting, diarrhea or constipation.  She tolerated the first week of her treatment fairly well.  She is here for evaluation and repeat blood work.  MEDICAL HISTORY: Past Medical History:  Diagnosis Date   Allergic rhinitis    Asthma 2008   Chronic obstructive pulmonary disease (COPD) ([facility]) 06/08/2016   Meds: Spiriva, albuterol inhaler PFT (11/29/2016): Moderate-severe obstruction by PFT 11/2016   Ear itching 07/05/2017   Grieving 06/08/2016   History of pneumonia    x4   Hyperlipidemia 09/10/2016   On atorvastatin 40 mg QD, ASA 81 mg QD ASCVD risk 17.1%, mod-high intensity statin Risk factors: FHx sig for MI: mother 60s-50s, and father age 24, HTN, smoker   Hypertension    Night sweats 06/08/2016   NSCL Ca dx'd 05/2018   xrt comp 09/2018   Pulmonary nodules 06/12/2017   Tobacco use disorder 07/06/2016   On  nicotine replacement patch. Smoking cigs since age 66. Has 60 pack year history. Has COPD dx on SABA and LAMA.    ALLERGIES:  is allergic to codeine; penicillins; and anoro ellipta [umeclidinium-vilanterol].  MEDICATIONS:  Current Outpatient Medications  Medication Sig Dispense Refill   albuterol (PROAIR HFA) 108 (90 Base) MCG/ACT inhaler Inhale 2 puffs into the lungs every 6 (six) hours as needed for wheezing or shortness of breath. 3 Inhaler 1   albuterol (PROVENTIL) (2.5 MG/3ML) 0.083% nebulizer solution Take 2.5 mg by nebulization daily as needed for wheezing or shortness of breath.     aspirin EC 81 MG tablet Take 81 mg by mouth daily.     cetirizine (ZYRTEC) 10 MG tablet Take 10 mg by mouth daily.     dexamethasone (DECADRON) 4 MG tablet Take 1 tablet (4 mg total) by mouth every 6 (six) hours for 30 days. 120 tablet 0   fluticasone (FLONASE) 50 MCG/ACT nasal spray Place 2 sprays into both nostrils daily. (Patient taking differently: Place 2 sprays into both nostrils 2 (two) times a day. ) 16 g 1   glucosamine-chondroitin 500-400 MG tablet Take 1 tablet by mouth daily.      Glycopyrrolate-Formoterol (BEVESPI AEROSPHERE) 9-4.8 MCG/ACT AERO Inhale 2 puffs into the lungs 2 (two) times daily. 3 Inhaler 2   lidocaine-prilocaine (EMLA) cream APP TOPICALLY ONCE FOR 1 DOSE UTD     Multiple Vitamin (MULTIVITAMIN WITH MINERALS) TABS tablet Take 1 tablet by mouth daily.     prochlorperazine (COMPAZINE) 10 MG tablet Take 1  tablet (10 mg total) by mouth every 6 (six) hours as needed for nausea or vomiting. 30 tablet 0   No current facility-administered medications for this visit.     SURGICAL HISTORY:  Past Surgical History:  Procedure Laterality Date   CATARACT EXTRACTION Left 05/08/2016   CATARACT EXTRACTION Right 05/22/2016   IR IMAGING GUIDED PORT INSERTION  02/25/2019   RADIOLOGY WITH ANESTHESIA N/A 02/13/2019   Procedure: BRAIN MRI;  Surgeon: Radiologist, Medication, MD;   Location: [facility];  Service: Radiology;  Laterality: N/A;   TOTAL ABDOMINAL HYSTERECTOMY W/ BILATERAL SALPINGOOPHORECTOMY Bilateral 1975   VIDEO BRONCHOSCOPY WITH ENDOBRONCHIAL NAVIGATION Left 07/09/2018   Procedure: VIDEO BRONCHOSCOPY WITH ENDOBRONCHIAL NAVIGATION;  Surgeon: Collene Gobble, MD;  Location: [facility];  Service: Thoracic;  Laterality: Left;    REVIEW OF SYSTEMS:  A comprehensive review of systems was negative except for: Constitutional: positive for fatigue Musculoskeletal: positive for back pain   PHYSICAL EXAMINATION: General appearance: alert, cooperative, fatigued and no distress Head: Normocephalic, without obvious abnormality, atraumatic Neck: no adenopathy, no JVD, supple, symmetrical, trachea midline and thyroid not enlarged, symmetric, no tenderness/mass/nodules Lymph nodes: Cervical, supraclavicular, and axillary nodes normal. Resp: clear to auscultation bilaterally Back: symmetric, no curvature. ROM normal. No CVA tenderness. Cardio: regular rate and rhythm, S1, S2 normal, no murmur, click, rub or gallop GI: soft, non-tender; bowel sounds normal; no masses,  no organomegaly Extremities: extremities normal, atraumatic, no cyanosis or edema  ECOG PERFORMANCE STATUS: 1 - Symptomatic but completely ambulatory  Blood pressure (!) 175/81, pulse 93, temperature (!) 97.3 F (36.3 C), temperature source Oral, resp. rate 18, height 5' 4.02" (1.626 m), weight 94 lb 5 oz (42.8 kg), SpO2 96 %.  LABORATORY DATA: Lab Results  Component Value Date   WBC 12.7 (H) 03/09/2019   HGB 11.4 (L) 03/09/2019   HCT 34.0 (L) 03/09/2019   MCV 93.4 03/09/2019   PLT 217 03/09/2019      Chemistry      Component Value Date/Time   NA 139 03/09/2019 0928   NA 130 (L) 01/26/2019 1153   K 3.9 03/09/2019 0928   CL 103 03/09/2019 0928   CO2 26 03/09/2019 0928   BUN 11 03/09/2019 0928   BUN 5 (L) 01/26/2019 1153   CREATININE 0.56 03/09/2019 0928   CREATININE 0.69 06/08/2016 1128        Component Value Date/Time   CALCIUM 8.9 03/09/2019 0928   ALKPHOS 107 03/09/2019 0928   AST 13 (L) 03/09/2019 0928   ALT 15 03/09/2019 0928   BILITOT 0.4 03/09/2019 0928       RADIOGRAPHIC STUDIES: Mr Brain Wo Contrast  Result Date: 02/12/2019 CLINICAL DATA:  73 year old female with left 3rd nerve palsy for 6 days. History of lung cancer treated earlier this year. EXAM: MRI HEAD WITHOUT CONTRAST TECHNIQUE: Multiplanar, multiecho pulse sequences of the brain and surrounding structures were obtained without intravenous contrast. COMPARISON:  None. FINDINGS: Study is intermittently degraded by motion artifact despite repeated imaging attempts. Brain: There is a small focus of restricted diffusion in the inferior left occipital lobe white matter best seen on series 11, image 11. However, no brainstem diffusion abnormality is identified. Faint if any associated T2 and FLAIR hyperintensity in the left occipital lobe white matter. No associated hemorrhage or mass effect. However, the right temporal lobe is abnormal, with mass-like soft tissue thickening and architectural distortion most apparent on series 12, image 15 and series 5, image 10. There is associated mass effect on the right  temporal horn, although no ventriculomegaly. However, the abnormal parenchyma appears largely isointense to other gray and white matter signal. It is unclear whether the some of the right insula might also be affected and abnormally thickened (series 10, image 50). No midline shift. Basilar cisterns remain normal. The deep gray matter nuclei brainstem and cerebellum appear normal. No cortical encephalomalacia or chronic cerebral blood products. Cervicomedullary junction within normal limits. Vascular: Partial loss of the distal left vertebral artery flow void (series 5, image 2). Other Major intracranial vascular flow voids are preserved. Skull and upper cervical spine: Negative visible cervical spine. Visualized bone marrow  signal is within normal limits. Sinuses/Orbits: The pituitary gland also appears at the upper limits of normal, but the suprasellar cistern is preserved. Normal noncontrast appearance of the cavernous sinuses. Postoperative changes to both globes, the intraorbital soft tissues appear normal. Paranasal sinuses are clear. Other: Mastoid air cells are clear. Visible internal auditory structures appear normal. Scalp and face soft tissues appear negative. IMPRESSION: 1. Intermittently motion degraded study despite premedication and repeated imaging attempts. 2. The dominant abnormality is mass-like enlargement of the right temporal lobe, although the affected area appears largely isointense to normal brain parenchyma. The right insula and perhaps also the pituitary gland may be affected. Main differential considerations at this point are an infiltrative tumor, and cortical dysplasia (although the latter would seem unlikely if there is no chronic seizure history). Recommend follow-up Brain MRI with sedation (or anesthesia), done without and with IV contrast. 3. 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 No brainstem ischemia or abnormality identified. 4. 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 Electronically Signed   By: Genevie Ann M.D.   On: 02/12/2019 15:13   Mr Jeri Cos YP Contrast  Result Date: 02/13/2019 CLINICAL DATA:  Left ptosis and diplopia. History of non-small cell lung cancer with abnormal appearance of the right temporal lobe on a motion degraded noncontrast MRI yesterday. EXAM: MRI HEAD WITHOUT AND WITH CONTRAST TECHNIQUE: Multiplanar, multiecho pulse sequences of the brain and surrounding structures were obtained without and with intravenous contrast. CONTRAST:  5 mL Gadavist COMPARISON:  Noncontrast brain MRI 02/12/2019 FINDINGS: Brain: Abnormal appearance of the right temporal lobe described on yesterday's MRI is nonenhancing and on today's less motion degraded examination  reflects extensive subcortical heterotopia throughout the mesial temporal lobe with dysmorphic right temporal horn. There is a 6 mm focus of ring enhancement in the left occipital lobe which corresponds to the restricted diffusion seen on yesterday's study without significant vasogenic edema. No second enhancing brain lesion is identified. However, there is diffuse enlargement and heterogeneous enhancement of the pituitary gland which measures 12 mm in height and is contiguous with heterogeneously hypoenhancing tissue extending into the left cavernous sinus. The pituitary bulges into the suprasellar cistern, however there is no mass effect on the optic chiasm. No acute infarct, intracranial hemorrhage, midline shift, or extra-axial fluid collection is identified. A few scattered small foci of T2 hyperintensity in the cerebral white matter are nonspecific though may reflect minimal chronic small vessel ischemic disease. There is a small chronic cortical infarct posteriorly in the left frontal lobe. There is mild cerebral atrophy. Vascular: Major intracranial vascular flow voids are preserved. Skull and upper cervical spine: Unremarkable bone marrow signal. Sinuses/Orbits: Bilateral cataract extraction. Paranasal sinuses and mastoid air cells are clear. Other: None. IMPRESSION: 1. 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 2. 6 mm ring-enhancing lesion in the left occipital lobe with considerations including  subacute infarct and metastasis. Consider short-term follow-up MRI in 6 weeks. 3. 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 Electronically Signed   By: Logan Bores M.D.   On: 02/13/2019 16:22   Ct Biopsy  Result Date: 02/16/2019 INDICATION: History of small cell lung cancer, now with indeterminate retroperitoneal lymphadenopathy. Please perform CT-guided retroperitoneal lymph node biopsy for tissue diagnostic  purposes. EXAM: CT-GUIDED BIOPSY OF INDETERMINATE LEFT-SIDED RETROPERITONEAL NODAL CONGLOMERATION COMPARISON:  CT abdomen and pelvis-01/28/2019 MEDICATIONS: None. ANESTHESIA/SEDATION: Fentanyl 100 mcg IV; Versed 2 mg IV Sedation time: 16 minutes; The patient was continuously monitored during the procedure by the interventional radiology nurse under my direct supervision. CONTRAST:  None. COMPLICATIONS: None immediate. PROCEDURE: Informed consent was obtained from the patient following an explanation of the procedure, risks, benefits and alternatives. A time out was performed prior to the initiation of the procedure. The patient was positioned prone on the CT table and a limited CT was performed for procedural planning demonstrating unchanged size and appearance infiltrative left-sided retroperitoneal nodal conglomeration with dominant macrolobulated component measuring at least 4.3 x 3.5 cm (image 36, series 2). The procedure was planned. The operative site was prepped and draped in the usual sterile fashion. Appropriate trajectory was confirmed with a 22 gauge spinal needle after the adjacent tissues were anesthetized with 1% Lidocaine with epinephrine. Under intermittent CT guidance, a 17 gauge coaxial needle was advanced into the peripheral aspect of the mass. Appropriate positioning was confirmed and 6 core needle biopsy samples were obtained with an 18 gauge core needle biopsy device. The co-axial needle was removed following the administration of a Gel-Foam slurry and hemostasis was achieved with manual compression. A limited postprocedural CT was negative for hemorrhage or additional complication. A dressing was placed. The patient tolerated the procedure well without immediate postprocedural complication. IMPRESSION: Technically successful CT guided core needle biopsy of indeterminate left-sided retroperitoneal nodal conglomeration. Electronically Signed   By: Sandi Mariscal M.D.   On: 02/16/2019 14:20   Ir  Imaging Guided Port Insertion  Result Date: 02/25/2019 CLINICAL DATA:  Metastatic small cell lung cancer EXAM: RIGHT INTERNAL JUGULAR SINGLE LUMEN POWER PORT CATHETER INSERTION Date:  02/25/2019 02/25/2019 2:42 pm Radiologist:  Jerilynn Mages. Daryll Brod, MD Guidance:  Ultrasound and fluoroscopic MEDICATIONS: Ancef 2 g; The antibiotic was administered within an appropriate time interval prior to skin puncture. ANESTHESIA/SEDATION: Versed 3.0 mg IV; Fentanyl 100 mcg IV; Moderate Sedation Time:  23 minutes The patient was continuously monitored during the procedure by the interventional radiology nurse under my direct supervision. FLUOROSCOPY TIME:  One minutes, 0 seconds (4 mGy) COMPLICATIONS: None immediate. CONTRAST:  None. PROCEDURE: Informed consent was obtained from the patient following explanation of the procedure, risks, benefits and alternatives. The patient understands, agrees and consents for the procedure. All questions were addressed. A time out was performed. Maximal barrier sterile technique utilized including caps, mask, sterile gowns, sterile gloves, large sterile drape, hand hygiene, and 2% chlorhexidine scrub. Under sterile conditions and local anesthesia, right internal jugular micropuncture venous access was performed. Access was performed with ultrasound. Images were obtained for documentation of the patent right internal jugular vein. A guide wire was inserted followed by a transitional dilator. This allowed insertion of a guide wire and catheter into the IVC. Measurements were obtained from the SVC / RA junction back to the right IJ venotomy site. In the right infraclavicular chest, a subcutaneous pocket was created over the second anterior rib.  This was done under sterile conditions and local anesthesia. 1% lidocaine with epinephrine was utilized for this. A 2.5 cm incision was made in the skin. Blunt dissection was performed to create a subcutaneous pocket over the right pectoralis major muscle. The  pocket was flushed with saline vigorously. There was adequate hemostasis. The port catheter was assembled and checked for leakage. The port catheter was secured in the pocket with two retention sutures. The tubing was tunneled subcutaneously to the right venotomy site and inserted into the SVC/RA junction through a valved peel-away sheath. Position was confirmed with fluoroscopy. Images were obtained for documentation. The patient tolerated the procedure well. No immediate complications. Incisions were closed in a two layer fashion with 4 - 0 Vicryl suture. Dermabond was applied to the skin. The port catheter was accessed, blood was aspirated followed by saline and heparin flushes. Needle was removed. A dry sterile dressing was applied. IMPRESSION: Ultrasound and fluoroscopically guided right internal jugular single lumen power port catheter insertion. Tip in the SVC/RA junction. Catheter ready for use. Electronically Signed   By: Jerilynn Mages.  Shick M.D.   On: 02/25/2019 14:45    ASSESSMENT AND PLAN: This is a very pleasant 73 years old white female with recently diagnosed extensive stage small cell lung cancer and she is currently undergoing systemic chemotherapy with carboplatin, etoposide and Imfinzi status post 1 cycle.  She tolerated the first week of her treatment well with no concerning complaints except for low back pain from the Neulasta injection. I recommended for the patient to continue with the weekly lab for now. We will see her back for follow-up visit in 2 weeks for evaluation before starting cycle #2. She was advised to call immediately if she has any concerning symptoms in the interval. The patient voices understanding of current disease status and treatment options and is in agreement with the current care plan. All questions were answered. The patient knows to call the clinic with any problems, questions or concerns. We can certainly see the patient much sooner if necessary.  I spent 10 minutes  counseling the patient face to face. The total time spent in the appointment was 15 minutes.  Disclaimer: This note was dictated with voice recognition software. Similar sounding words can inadvertently be transcribed and may not be corrected upon review.

## 2019-03-12 ENCOUNTER — Other Ambulatory Visit: Payer: Self-pay | Admitting: Radiation Therapy

## 2019-03-15 ENCOUNTER — Other Ambulatory Visit: Payer: Self-pay | Admitting: Internal Medicine

## 2019-03-16 ENCOUNTER — Inpatient Hospital Stay: Payer: Medicare Other

## 2019-03-16 ENCOUNTER — Other Ambulatory Visit: Payer: Self-pay

## 2019-03-16 DIAGNOSIS — Z5112 Encounter for antineoplastic immunotherapy: Secondary | ICD-10-CM | POA: Diagnosis not present

## 2019-03-16 DIAGNOSIS — C3431 Malignant neoplasm of lower lobe, right bronchus or lung: Secondary | ICD-10-CM

## 2019-03-16 LAB — CMP (CANCER CENTER ONLY)
ALT: 18 U/L (ref 0–44)
AST: 13 U/L — ABNORMAL LOW (ref 15–41)
Albumin: 3.9 g/dL (ref 3.5–5.0)
Alkaline Phosphatase: 172 U/L — ABNORMAL HIGH (ref 38–126)
Anion gap: 9 (ref 5–15)
BUN: 10 mg/dL (ref 8–23)
CO2: 27 mmol/L (ref 22–32)
Calcium: 9.6 mg/dL (ref 8.9–10.3)
Chloride: 102 mmol/L (ref 98–111)
Creatinine: 0.64 mg/dL (ref 0.44–1.00)
GFR, Est AFR Am: >60 mL/min (ref >60)
GFR, Estimated: >60 mL/min (ref >60)
Glucose, Bld: 127 mg/dL — ABNORMAL HIGH (ref 70–99)
Potassium: 4.2 mmol/L (ref 3.5–5.1)
Sodium: 138 mmol/L (ref 135–145)
Total Bilirubin: <0.2 mg/dL — ABNORMAL LOW (ref 0.3–1.2)
Total Protein: 6.8 g/dL (ref 6.5–8.1)

## 2019-03-16 LAB — CBC WITH DIFFERENTIAL (CANCER CENTER ONLY)
Abs Immature Granulocytes: 3.12 10*3/uL — ABNORMAL HIGH (ref 0.00–0.07)
Basophils Absolute: 0 10*3/uL (ref 0.0–0.1)
Basophils Relative: 0 %
Eosinophils Absolute: 0 10*3/uL (ref 0.0–0.5)
Eosinophils Relative: 0 %
HCT: 38.4 % (ref 36.0–46.0)
Hemoglobin: 12.8 g/dL (ref 12.0–15.0)
Immature Granulocytes: 11 %
Lymphocytes Relative: 7 %
Lymphs Abs: 2 10*3/uL (ref 0.7–4.0)
MCH: 31.8 pg (ref 26.0–34.0)
MCHC: 33.3 g/dL (ref 30.0–36.0)
MCV: 95.5 fL (ref 80.0–100.0)
Monocytes Absolute: 1.1 10*3/uL — ABNORMAL HIGH (ref 0.1–1.0)
Monocytes Relative: 4 %
Neutro Abs: 22.6 10*3/uL — ABNORMAL HIGH (ref 1.7–7.7)
Neutrophils Relative %: 78 %
Platelet Count: 204 10*3/uL (ref 150–400)
RBC: 4.02 MIL/uL (ref 3.87–5.11)
RDW: 16.7 % — ABNORMAL HIGH (ref 11.5–15.5)
WBC Count: 28.8 10*3/uL — ABNORMAL HIGH (ref 4.0–10.5)
nRBC: 0.1 % (ref 0.0–0.2)

## 2019-03-17 ENCOUNTER — Other Ambulatory Visit: Payer: Self-pay | Admitting: Pulmonary Disease

## 2019-03-18 ENCOUNTER — Encounter (HOSPITAL_COMMUNITY): Payer: Self-pay | Admitting: Emergency Medicine

## 2019-03-18 ENCOUNTER — Other Ambulatory Visit: Payer: Self-pay

## 2019-03-18 ENCOUNTER — Emergency Department (HOSPITAL_COMMUNITY): Payer: Medicare Other

## 2019-03-18 ENCOUNTER — Emergency Department (HOSPITAL_COMMUNITY)
Admission: EM | Admit: 2019-03-18 | Discharge: 2019-03-19 | Disposition: A | Discharge status: Home / Self Care | Payer: Medicare Other | Attending: Emergency Medicine | Admitting: Emergency Medicine

## 2019-03-18 DIAGNOSIS — Z95828 Presence of other vascular implants and grafts: Secondary | ICD-10-CM | POA: Insufficient documentation

## 2019-03-18 DIAGNOSIS — I10 Essential (primary) hypertension: Secondary | ICD-10-CM | POA: Insufficient documentation

## 2019-03-18 DIAGNOSIS — Y999 Unspecified external cause status: Secondary | ICD-10-CM | POA: Diagnosis not present

## 2019-03-18 DIAGNOSIS — Y939 Activity, unspecified: Secondary | ICD-10-CM | POA: Insufficient documentation

## 2019-03-18 DIAGNOSIS — Y929 Unspecified place or not applicable: Secondary | ICD-10-CM | POA: Insufficient documentation

## 2019-03-18 DIAGNOSIS — Z87891 Personal history of nicotine dependence: Secondary | ICD-10-CM | POA: Diagnosis not present

## 2019-03-18 DIAGNOSIS — C3412 Malignant neoplasm of upper lobe, left bronchus or lung: Secondary | ICD-10-CM | POA: Diagnosis not present

## 2019-03-18 DIAGNOSIS — J45909 Unspecified asthma, uncomplicated: Secondary | ICD-10-CM | POA: Insufficient documentation

## 2019-03-18 DIAGNOSIS — S22050A Wedge compression fracture of T5-T6 vertebra, initial encounter for closed fracture: Secondary | ICD-10-CM | POA: Diagnosis not present

## 2019-03-18 DIAGNOSIS — Z7982 Long term (current) use of aspirin: Secondary | ICD-10-CM | POA: Insufficient documentation

## 2019-03-18 DIAGNOSIS — H539 Unspecified visual disturbance: Secondary | ICD-10-CM

## 2019-03-18 DIAGNOSIS — J449 Chronic obstructive pulmonary disease, unspecified: Secondary | ICD-10-CM | POA: Diagnosis not present

## 2019-03-18 DIAGNOSIS — X58XXXA Exposure to other specified factors, initial encounter: Secondary | ICD-10-CM | POA: Diagnosis not present

## 2019-03-18 DIAGNOSIS — Z79899 Other long term (current) drug therapy: Secondary | ICD-10-CM | POA: Insufficient documentation

## 2019-03-18 DIAGNOSIS — H538 Other visual disturbances: Secondary | ICD-10-CM | POA: Insufficient documentation

## 2019-03-18 DIAGNOSIS — S299XXA Unspecified injury of thorax, initial encounter: Secondary | ICD-10-CM | POA: Diagnosis present

## 2019-03-18 LAB — DIFFERENTIAL
Abs Immature Granulocytes: 1.46 10*3/uL — ABNORMAL HIGH (ref 0.00–0.07)
Basophils Absolute: 0.1 10*3/uL (ref 0.0–0.1)
Basophils Relative: 1 %
Eosinophils Absolute: 0 10*3/uL (ref 0.0–0.5)
Eosinophils Relative: 0 %
Immature Granulocytes: 8 %
Lymphocytes Relative: 10 %
Lymphs Abs: 2 10*3/uL (ref 0.7–4.0)
Monocytes Absolute: 1.1 10*3/uL — ABNORMAL HIGH (ref 0.1–1.0)
Monocytes Relative: 6 %
Neutro Abs: 14.7 10*3/uL — ABNORMAL HIGH (ref 1.7–7.7)
Neutrophils Relative %: 75 %

## 2019-03-18 LAB — CBC
HCT: 33.8 % — ABNORMAL LOW (ref 36.0–46.0)
Hemoglobin: 11.1 g/dL — ABNORMAL LOW (ref 12.0–15.0)
MCH: 32.2 pg (ref 26.0–34.0)
MCHC: 32.8 g/dL (ref 30.0–36.0)
MCV: 98 fL (ref 80.0–100.0)
Platelets: 177 10*3/uL (ref 150–400)
RBC: 3.45 MIL/uL — ABNORMAL LOW (ref 3.87–5.11)
RDW: 16.7 % — ABNORMAL HIGH (ref 11.5–15.5)
WBC: 19.3 10*3/uL — ABNORMAL HIGH (ref 4.0–10.5)
nRBC: 0.1 % (ref 0.0–0.2)

## 2019-03-18 LAB — URINALYSIS, ROUTINE W REFLEX MICROSCOPIC
Bilirubin Urine: NEGATIVE
Glucose, UA: NEGATIVE mg/dL
Hgb urine dipstick: NEGATIVE
Ketones, ur: NEGATIVE mg/dL
Leukocytes,Ua: NEGATIVE
Nitrite: NEGATIVE
Protein, ur: NEGATIVE mg/dL
Specific Gravity, Urine: 1.025 (ref 1.005–1.030)
pH: 7 (ref 5.0–8.0)

## 2019-03-18 LAB — APTT: aPTT: 23 seconds — ABNORMAL LOW (ref 24–36)

## 2019-03-18 LAB — PROTIME-INR
INR: 1 (ref 0.8–1.2)
Prothrombin Time: 12.8 seconds (ref 11.4–15.2)

## 2019-03-18 LAB — COMPREHENSIVE METABOLIC PANEL
ALT: 16 U/L (ref 0–44)
AST: 13 U/L — ABNORMAL LOW (ref 15–41)
Albumin: 3.2 g/dL — ABNORMAL LOW (ref 3.5–5.0)
Alkaline Phosphatase: 115 U/L (ref 38–126)
Anion gap: 10 (ref 5–15)
BUN: 14 mg/dL (ref 8–23)
CO2: 26 mmol/L (ref 22–32)
Calcium: 8.4 mg/dL — ABNORMAL LOW (ref 8.9–10.3)
Chloride: 101 mmol/L (ref 98–111)
Creatinine, Ser: 0.51 mg/dL (ref 0.44–1.00)
GFR calc Af Amer: >60 mL/min (ref >60)
GFR calc non Af Amer: >60 mL/min (ref >60)
Glucose, Bld: 109 mg/dL — ABNORMAL HIGH (ref 70–99)
Potassium: 3.5 mmol/L (ref 3.5–5.1)
Sodium: 137 mmol/L (ref 135–145)
Total Bilirubin: 0.2 mg/dL — ABNORMAL LOW (ref 0.3–1.2)
Total Protein: 5.5 g/dL — ABNORMAL LOW (ref 6.5–8.1)

## 2019-03-18 LAB — RAPID URINE DRUG SCREEN, HOSP PERFORMED
Amphetamines: NOT DETECTED
Barbiturates: NOT DETECTED
Benzodiazepines: NOT DETECTED
Cocaine: NOT DETECTED
Opiates: NOT DETECTED
Tetrahydrocannabinol: NOT DETECTED

## 2019-03-18 LAB — ETHANOL: Alcohol, Ethyl (B): <10 mg/dL (ref <10)

## 2019-03-18 LAB — I-STAT CHEM 8, ED
BUN: 13 mg/dL (ref 8–23)
Calcium, Ion: 1.17 mmol/L (ref 1.15–1.40)
Chloride: 99 mmol/L (ref 98–111)
Creatinine, Ser: 0.5 mg/dL (ref 0.44–1.00)
Glucose, Bld: 96 mg/dL (ref 70–99)
HCT: 34 % — ABNORMAL LOW (ref 36.0–46.0)
Hemoglobin: 11.6 g/dL — ABNORMAL LOW (ref 12.0–15.0)
Potassium: 3.5 mmol/L (ref 3.5–5.1)
Sodium: 136 mmol/L (ref 135–145)
TCO2: 27 mmol/L (ref 22–32)

## 2019-03-18 MED ORDER — HALOPERIDOL LACTATE 5 MG/ML IJ SOLN
2.0000 mg | Freq: Once | INTRAMUSCULAR | Status: AC
  Administered 2019-03-18: 23:00:00 2 mg via INTRAVENOUS
  Filled 2019-03-18: qty 1

## 2019-03-18 MED ORDER — LORAZEPAM 2 MG/ML IJ SOLN
1.5000 mg | Freq: Once | INTRAMUSCULAR | Status: AC
  Administered 2019-03-18: 22:00:00 1.5 mg via INTRAVENOUS
  Filled 2019-03-18: qty 1

## 2019-03-18 MED ORDER — SODIUM CHLORIDE (PF) 0.9 % IJ SOLN
INTRAMUSCULAR | Status: AC
  Filled 2019-03-18: qty 50

## 2019-03-18 MED ORDER — IOHEXOL 350 MG/ML SOLN
80.0000 mL | Freq: Once | INTRAVENOUS | Status: AC | PRN
  Administered 2019-03-18: 19:00:00 80 mL via INTRAVENOUS

## 2019-03-18 MED ORDER — DEXAMETHASONE SODIUM PHOSPHATE 10 MG/ML IJ SOLN
10.0000 mg | Freq: Once | INTRAMUSCULAR | Status: AC
  Administered 2019-03-18: 10 mg via INTRAVENOUS
  Filled 2019-03-18: qty 1

## 2019-03-18 MED ORDER — LORAZEPAM 2 MG/ML IJ SOLN
0.5000 mg | Freq: Once | INTRAMUSCULAR | Status: AC
  Administered 2019-03-18: 23:00:00 0.5 mg via INTRAVENOUS
  Filled 2019-03-18: qty 1

## 2019-03-18 MED ORDER — GADOBUTROL 1 MMOL/ML IV SOLN
5.0000 mL | Freq: Once | INTRAVENOUS | Status: AC | PRN
  Administered 2019-03-18: 5 mL via INTRAVENOUS

## 2019-03-18 NOTE — ED Notes (Signed)
Pt. Placed on pure wick per RN,Kaitlyn.

## 2019-03-18 NOTE — ED Notes (Signed)
Pt returned from MRI °

## 2019-03-18 NOTE — ED Notes (Signed)
Code stroke called

## 2019-03-18 NOTE — ED Notes (Signed)
EKG given to EDP,Schlossman,MD., for review.

## 2019-03-18 NOTE — ED Notes (Signed)
Pt. In MRI.

## 2019-03-18 NOTE — ED Notes (Signed)
Pt transported to MRI 

## 2019-03-18 NOTE — Consult Note (Signed)
[facility] Consult Services   Date of Service:   03/18/2019 19:45:23  Impression:     .  Rule Out Acute Ischemic Stroke     .  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 Presents with nonspecific vision impairment (seeing shadows) Head CT: No acute Intracranial abnormality. NIHSS 3 Symptoms/presentation likely from known occipital metastasis but cannot rule out Acute Ischemic Stroke just based on clinical presentation. Due to metastasis she is not a candidate for IV tPA and appears the points on NIHSS are chronic deficits. Did STAT head and neck CTA to investigate for Large Vessel Occlusion.  Metrics: Last Known Well: 03/18/2019 16:00:00 [facility] Notification Time: 03/18/2019 19:45:23 Arrival Time: 03/18/2019 18:35:00 Stamp Time: 03/18/2019 19:45:23 Time First Login Attempt: 03/18/2019 19:50:25 Video Start Time: 03/18/2019 19:50:25  Symptoms: vision disturbance NIHSS Start Assessment Time: 03/18/2019 20:00:46 Patient is not a candidate for tPA. Patient was not deemed candidate for tPA thrombolytics because of intracranial metastasis. Video End Time: 03/18/2019 20:09:37  CT head was reviewed.  Lower Likelihood of Large Vessel Occlusion but Following Stat Studies are Recommended  CTA Head and Neck. Reviewed, No Indication of Large Vessel Occlusive Thrombus, Patient is not an NIR Candidate.   Radiologist was not called back for review of advanced imaging because report available ED Physician notified of diagnostic impression and management plan on 03/18/2019 20:15:21  Our recommendations are outlined below.  Recommendations:     .  Activate Stroke Protocol Admission/Order Set     .  Stroke/Telemetry Floor     .  Neuro Checks     .  Bedside Swallow Eval     .  DVT Prophylaxis     .  IV  Fluids, Normal Saline     .  Head of Bed 30 Degrees     .  Euglycemia and Avoid Hyperthermia (PRN Acetaminophen)     .  Antiplatelet Therapy Recommended     .  brain MRI with and without contrast, if consistent with Acute Ischemic Stroke and if cleared by neurosurgery then  Routine Consultation with [facility] for Follow up Care  Sign Out:     .  Discussed with Emergency Department Provider    ------------------------------------------------------------------------------  History of Present Illness: Patient is a 73 year old Female.  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 last known well per patient: 16:00 developed acute visual changes bilaterally. Anticoagulation or Antiplatelet use: aspirin Premorbid Level of function: Independent, Normal cognition   Examination: 1A: Level of Consciousness - Alert; keenly responsive + 0 1B: Ask Month and Age - Both Questions Right + 0 1C: Blink Eyes & Squeeze Hands - Performs Both Tasks + 0 2: Test Horizontal Extraocular Movements - Normal + 0 3: Test Visual Fields - Partial Hemianopia + 1 4: Test Facial Palsy (Use Grimace if Obtunded) - Minor paralysis (flat nasolabial fold, smile asymmetry) + 1 5A: Test Left Arm Motor Drift - No Drift for 10 Seconds + 0 5B: Test Right Arm Motor Drift - No Drift for 10 Seconds + 0 6A: Test Left Leg Motor Drift - No Drift for 5 Seconds + 0 6B: Test Right Leg Motor Drift - No Drift for 5 Seconds + 0 7: Test Limb Ataxia (  FNF/Heel-Shin) - No Ataxia + 0 8: Test Sensation - Mild-Moderate Loss: Less Sharp/More Dull + 1 9: Test Language/Aphasia - Normal; No aphasia + 0 10: Test Dysarthria - Normal + 0 11: Test Extinction/Inattention - No abnormality + 0  NIHSS Score: 3   Due to the immediate potential for life-threatening  deterioration due to underlying acute neurologic illness, I spent 19 minutes providing critical care. This time includes time for face to face visit via telemedicine, review of medical records, imaging studies and discussion of findings with providers, the patient and/or family.   Dr Elenor Quinones   [facility] [phone]   Case [id number]

## 2019-03-18 NOTE — ED Provider Notes (Signed)
[facility] DEPT Provider Note   CSN: [id number] Arrival date & time: 03/18/19  1824    History   Chief Complaint Chief Complaint  Patient presents with   Visual Field Change    HPI Pamela Russell is a 73 y.o. female.     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  Patient reports that approximately 4 PM she had significant decrease in her vision.  Reports she can only see shadows, light and dark.  However on further questioning, she is able to see colors, and read words although they are blurry.  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  Denies headache, nausea, vomiting, numbness, weakness, facial droop, difficulty talking.  Reports he has difficulty walking and due to her inability to see.  Has history of ptosis of the left eye although daughter notes today it appears bilateral.  Prior to developing these symptoms, she did have eye itching which is not unusual for her, and daughter had bought her an over-the-counter eye itch relief eyedrop which had helped with her symptoms and helped her rest for about an hour prior to the visual loss beginning.  Past Medical History:  Diagnosis Date   Allergic rhinitis    Asthma 2008   Chronic obstructive pulmonary disease (COPD) ([place]) 06/08/2016   Meds: Spiriva, albuterol inhaler PFT (11/29/2016): Moderate-severe obstruction by PFT 11/2016   Ear itching 07/05/2017   Grieving 06/08/2016   History of pneumonia    x4   Hyperlipidemia 09/10/2016   On atorvastatin 40 mg QD, ASA 81 mg QD ASCVD risk 17.1%, mod-high intensity statin Risk factors: FHx sig for MI: mother 64s-50s, and father age 52, HTN, smoker   Hypertension    Night sweats 06/08/2016   NSCL Ca dx'd  05/2018   xrt comp 09/2018   Pulmonary nodules 06/12/2017   Tobacco use disorder 07/06/2016   On nicotine replacement patch. Smoking cigs since age 18. Has 60 pack year history. Has COPD dx on SABA and LAMA.    Patient Active Problem List   Diagnosis Date Noted   Abnormal TSH 03/08/2019   Port-A-Cath in place 02/26/2019   Encounter for antineoplastic chemotherapy 02/21/2019   Encounter for antineoplastic immunotherapy 02/21/2019   Goals of care, counseling/discussion 02/21/2019   Small cell lung cancer, right lower lobe ([place]) 02/19/2019   Leukocytosis 02/15/2019   Brain mass 02/13/2019   Hyponatremia 02/12/2019   Hypokalemia 02/12/2019   Ptosis 02/12/2019   Allergic rhinitis 02/11/2019   Diplopia 02/11/2019   Electrolyte disturbance 01/26/2019   Post-radiation pneumonitis ([place]) 01/21/2019   Hypotension 01/21/2019   Weakness 01/21/2019   COPD with acute exacerbation ([place]) 10/21/2018   Primary malignant neoplasm of bronchus of left upper lobe ([place]) 09/08/2018   Malignant neoplasm of right upper lobe of lung ([place]) 07/14/2018   Ear itching 07/05/2017   Pulmonary nodules 06/12/2017   Hyperlipidemia 09/10/2016   Tobacco use disorder 07/06/2016   Primary hypertension 06/08/2016   Chronic obstructive pulmonary disease (COPD) ([place]) 06/08/2016   Night sweats 06/08/2016    Past Surgical History:  Procedure Laterality Date   CATARACT EXTRACTION Left 05/08/2016   CATARACT EXTRACTION Right 05/22/2016   IR IMAGING GUIDED PORT INSERTION  02/25/2019   RADIOLOGY WITH ANESTHESIA N/A 02/13/2019   Procedure:  BRAIN MRI;  Surgeon: Radiologist, Medication, MD;  Location: [facility];  Service: Radiology;  Laterality: N/A;   TOTAL ABDOMINAL HYSTERECTOMY W/ BILATERAL SALPINGOOPHORECTOMY Bilateral 1975   VIDEO BRONCHOSCOPY WITH ENDOBRONCHIAL NAVIGATION Left 07/09/2018   Procedure: VIDEO BRONCHOSCOPY WITH ENDOBRONCHIAL NAVIGATION;  Surgeon: Collene Gobble, MD;  Location:  [facility] OR;  Service: Thoracic;  Laterality: Left;     OB History   No obstetric history on file.      Home Medications    Prior to Admission medications   Medication Sig Start Date End Date Taking? Authorizing Provider  albuterol (PROVENTIL) (2.5 MG/3ML) 0.083% nebulizer solution Take 2.5 mg by nebulization daily as needed for wheezing or shortness of breath.   Yes [provider]  albuterol (VENTOLIN HFA) 108 (90 Base) MCG/ACT inhaler USE 2 INHALATIONS BY MOUTH  EVERY 6 HOURS AS NEEDED FOR WHEEZING OR SHORTNESS OF  BREATH Patient taking differently: Inhale 2 puffs into the lungs every 6 (six) hours as needed for wheezing or shortness of breath.  03/17/19  Yes Mannam, Praveen, MD  aspirin EC 81 MG tablet Take 81 mg by mouth daily.   Yes [provider]  cetirizine (ZYRTEC) 10 MG tablet Take 10 mg by mouth daily.   Yes [provider]  fluticasone (FLONASE) 50 MCG/ACT nasal spray Place 2 sprays into both nostrils daily. Patient taking differently: Place 2 sprays into both nostrils 2 (two) times a day.  02/11/19  Yes Patriciaann Clan, DO  glucosamine-chondroitin 500-400 MG tablet Take 1 tablet by mouth daily.    Yes [provider]  Glycopyrrolate-Formoterol (BEVESPI AEROSPHERE) 9-4.8 MCG/ACT AERO Inhale 2 puffs into the lungs 2 (two) times daily. 12/17/18  Yes Fenton Foy, NP  Multiple Vitamin (MULTIVITAMIN WITH MINERALS) TABS tablet Take 1 tablet by mouth daily.   Yes [provider]  prochlorperazine (COMPAZINE) 10 MG tablet TAKE 1 TABLET BY MOUTH EVERY 6 HOURS AS NEEDED FOR NAUSEA AND VOMITING Patient taking differently: Take 10 mg by mouth 2 (two) times a day.  03/15/19  Yes Curt Bears, MD  lidocaine-prilocaine (EMLA) cream Apply 1 application topically daily as needed (port).  02/21/19   [provider]  prochlorperazine (COMPAZINE) 10 MG tablet Take 1 tablet (10 mg total) by mouth every 6 (six) hours as needed for nausea or  vomiting. 02/21/19   Curt Bears, MD    Family History Family History  Problem Relation Age of Onset   Heart disease Mother        MI in her 13's   Stroke Mother    Heart disease Father        CABG   Hypertension Father    Heart disease Sister 53       CABG   Stroke Sister    Diabetes Sister     Social History Social History   Tobacco Use   Smoking status: Former Smoker    Packs/day: 1.00    Years: 60.00    Pack years: 60.00    Types: Cigarettes    Start date: 08/21/1955    Quit date: 01/28/2019    Years since quitting: 0.1   Smokeless tobacco: Never Used   Tobacco comment: 3 ppd for 20+ years, has cut down to 0.5 ppd, in the past 3 months, no cigs in 1 month  Substance Use Topics   Alcohol use: Yes    Alcohol/week: 1.0 standard drinks    Types: 1 Cans of beer per week    Comment: occ  Drug use: No     Allergies   Codeine, Penicillins, and Anoro ellipta [umeclidinium-vilanterol]   Review of Systems Review of Systems  Constitutional: Negative for fever.  Eyes: Positive for itching and visual disturbance.  Respiratory: Negative for shortness of breath.   Cardiovascular: Negative for chest pain.  Gastrointestinal: Negative for abdominal pain.  Genitourinary: Negative for dysuria.  Skin: Negative for rash.  Neurological: Negative for dizziness, syncope, facial asymmetry, speech difficulty, weakness, numbness and headaches.     Physical Exam Updated Vital Signs BP 119/78    Pulse 87    Temp (!) 97.5 F (36.4 C) (Oral)    Resp 20    SpO2 100%   Physical Exam Vitals signs and nursing note reviewed.  Constitutional:      General: She is not in acute distress.    Appearance: She is well-developed. She is not diaphoretic.  HENT:     Head: Normocephalic and atraumatic.  Eyes:     General: Visual field deficit (difficult exam, concern for left sided visual field ) present.     Conjunctiva/sclera: Conjunctivae normal.  Neck:      Musculoskeletal: Normal range of motion.  Cardiovascular:     Rate and Rhythm: Normal rate and regular rhythm.     Heart sounds: Normal heart sounds. No murmur. No friction rub. No gallop.   Pulmonary:     Effort: Pulmonary effort is normal. No respiratory distress.     Breath sounds: Normal breath sounds. No wheezing or rales.  Abdominal:     General: There is no distension.     Palpations: Abdomen is soft.     Tenderness: There is no abdominal tenderness. There is no guarding.  Musculoskeletal:        General: No tenderness.  Skin:    General: Skin is warm and dry.     Findings: No erythema or rash.  Neurological:     Mental Status: She is alert and oriented to person, place, and time.     GCS: GCS eye subscore is 4. GCS verbal subscore is 5. GCS motor subscore is 6.     Cranial Nerves: No cranial nerve deficit, dysarthria or facial asymmetry.     Sensory: Sensation is intact. No sensory deficit.     Motor: Tremor (chronic) present. No weakness.     Coordination: Coordination is intact. Coordination normal.      ED Treatments / Results  Labs (all labs ordered are listed, but only abnormal results are displayed) Labs Reviewed  APTT - Abnormal; Notable for the following components:      Result Value   aPTT 23 (*)    All other components within normal limits  CBC - Abnormal; Notable for the following components:   WBC 19.3 (*)    RBC 3.45 (*)    Hemoglobin 11.1 (*)    HCT 33.8 (*)    RDW 16.7 (*)    All other components within normal limits  DIFFERENTIAL - Abnormal; Notable for the following components:   Neutro Abs 14.7 (*)    Monocytes Absolute 1.1 (*)    Abs Immature Granulocytes 1.46 (*)    All other components within normal limits  COMPREHENSIVE METABOLIC PANEL - Abnormal; Notable for the following components:   Glucose, Bld 109 (*)    Calcium 8.4 (*)    Total Protein 5.5 (*)    Albumin 3.2 (*)    AST 13 (*)    Total Bilirubin 0.2 (*)    All  other components  within normal limits  URINALYSIS, ROUTINE W REFLEX MICROSCOPIC - Abnormal; Notable for the following components:   Color, Urine STRAW (*)    All other components within normal limits  I-STAT CHEM 8, ED - Abnormal; Notable for the following components:   Hemoglobin 11.6 (*)    HCT 34.0 (*)    All other components within normal limits  ETHANOL  PROTIME-INR  RAPID URINE DRUG SCREEN, HOSP PERFORMED    EKG EKG Interpretation  Date/Time:  Wednesday March 18 2019 19:34:29 EDT Ventricular Rate:  98 PR Interval:    QRS Duration: 84 QT Interval:  348 QTC Calculation: 445 R Axis:   -72 Text Interpretation:  Sinus rhythm LAD, consider left anterior fascicular block Probable anteroseptal infarct, old ST elevation, consider inferior injury Baseline wander in lead(s) II III aVR aVF V3 Confirmed by Ripley Fraise [phone]) on 03/18/2019 11:28:01 PM Also confirmed by Ripley Fraise [phone]), editor Hattie Perch ([id number])  on 03/19/2019 6:37:12 AM   Radiology Ct Angio Head W Or Wo Contrast  Result Date: 03/18/2019 CLINICAL DATA:  Initial evaluation for acute diplopia. EXAM: CT ANGIOGRAPHY HEAD AND NECK TECHNIQUE: Multidetector CT imaging of the head and neck was performed using the standard protocol during bolus administration of intravenous contrast. Multiplanar CT image reconstructions and MIPs were obtained to evaluate the vascular anatomy. Carotid stenosis measurements (when applicable) are obtained utilizing NASCET criteria, using the distal internal carotid diameter as the denominator. CONTRAST:  7mL OMNIPAQUE IOHEXOL 350 MG/ML SOLN COMPARISON:  Prior head CT from earlier the same day. FINDINGS: CTA NECK FINDINGS Aortic arch: Partially visualized aortic arch of normal caliber. Origin of the right brachiocephalic and left common carotid arteries not visualized on this exam. Scattered atheromatous plaque within the visualized aortic arch and about the origin of the left subclavian artery without  hemodynamically significant stenosis. Right carotid system: Eccentric calcified plaque about the origin of the right CCA with associated narrowing of up to approximately 40% by NASCET criteria. Right CCA mildly tortuous with scattered atheromatous plaque distally without high-grade stenosis. Bulky calcified plaque about the right bifurcation/proximal right ICA. Associated stenosis of up to approximately 50% about the origin of the right ICA distally, right ICA mildly tortuous with scattered atheromatous plaque without additional high-grade stenosis, dissection, or occlusion. Left carotid system: Origin of the left CCA not visible on this exam. Scattered atheromatous plaque throughout the visualized left CCA with mild multifocal narrowing. Bulky mixed plaque about the left bifurcation/proximal left ICA with associated stenosis of up to 50% by NASCET criteria. Additional atheromatous plaque within the distal left ICA with associated focal stenosis of up to 65% by NASCET criteria (series 7, image 148). Left ICA otherwise patent to the skull base. Vertebral arteries: Both vertebral arteries arise from the subclavian arteries. Right vertebral artery dominant. Mild scattered atheromatous plaque within the right V1 and V2 segments with associated mild multifocal stenosis. Right vertebral otherwise widely patent to the skull base. Hypoplastic left vertebral artery occluded at its origin, and remains largely occluded within the neck. Distal reconstitution via muscular branches at the left V3 segment (series 7, image 163). Attenuated irregular flow seen within the hypoplastic left vertebral artery as it crosses into the cranial vault. Skeleton: Advanced compression deformity partially seen involving the T5 vertebral body with at least 80% height loss, age indeterminate, but new relative to recent CT from 01/16/2019. Irregular sclerotic lesion within the T1 vertebral body stable as compared to multiple previous exams, and  presumably benign. No  other discrete lytic or blastic osseous lesions. Moderate multilevel facet arthrosis noted within the cervical spine, left slightly worse than right. Degenerative changes noted about the TMJs bilaterally. Other neck: No other acute soft tissue abnormality within the neck. Prominent 1 cm left no noted adjacent to the left lobe of thyroid (series 5, image 31). Upper chest: Visualized upper chest demonstrates no acute finding. Upper lobe predominant centrilobular emphysema. Right-sided Port-A-Cath noted. Review of the MIP images confirms the above findings CTA HEAD FINDINGS Anterior circulation: Petrous right ICA widely patent. Focal atheromatous plaque within the petrous left ICA with associated short-segment mild stenosis. Extensive atheromatous plaque within the cavernous/supraclinoid ICAs with resultant moderate to severe diffuse stenosis (estimated 50-75% bilaterally, left worse than right). ICA termini well perfused. Right A1 patent. A hypoplastic left A1 appears occluded. Normal anterior communicating artery. Anterior cerebral arteries patent to their distal aspects without stenosis or occlusion. No M1 stenosis or occlusion. Normal MCA bifurcations. Distal MCA branches well perfused and symmetric. Posterior circulation: Focal plaque within the dominant right vertebral artery as it crosses into the cranial vault without significant stenosis. Dominant right V4 segment otherwise patent to the vertebrobasilar junction. Patent right PICA. Hypoplastic left vertebral artery attenuated and irregular. Superimposed short-segment moderate to severe proximal left V4 stenosis prior to the takeoff of the left PICA (series 7, image 151). Additional short-segment moderate stenosis within the distal left V4 segment just prior to the vertebrobasilar junction. Left PICA patent. Basilar irregular but widely patent to its distal aspect. Superior cerebral arteries patent bilaterally. Both of the posterior  cerebral arteries primarily supplied via the basilar. PCAs demonstrate scattered multifocal atheromatous irregularity but are well perfused to their distal aspects without high-grade stenosis. Venous sinuses: Patent. Anatomic variants: None significant. Review of the MIP images confirms the above findings IMPRESSION: 1. Negative CTA for emergent large vessel occlusion. 2. Bulky atheromatous plaque about the origins of the internal carotid arteries with associated stenoses of up to 50% by NASCET criteria bilaterally. Additional short-segment 50% stenosis involving the distal left ICA as above. 3. Heavy atherosclerotic change throughout the carotid siphons with associated moderate to severe multifocal stenoses, left worse than right. 4. 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 Superimposed moderate to severe multifocal left V4 stenoses as above. 5. Hypoplastic and occluded left A1 segment. Anterior cerebral arteries supplied via the right carotid artery system. 6. Severe compression deformity involving the T5 vertebral body, age indeterminate, but new relative to most recent chest CT from 01/16/2019. Correlation with history and physical exam recommended. 7. Ill-defined sclerotic lesion within the T1 vertebral body, indeterminate, but stable relative to multiple previous exams. 8. Emphysema. Critical Value/emergent results were called by telephone at the time of interpretation on 03/18/2019 at 8:09 pm to Dr. Gareth Morgan , who verbally acknowledged these results. Electronically Signed   By: Jeannine Boga M.D.   On: 03/18/2019 20:09   Ct Angio Neck W And/or Wo Contrast  Result Date: 03/18/2019 CLINICAL DATA:  Initial evaluation for acute diplopia. EXAM: CT ANGIOGRAPHY HEAD AND NECK TECHNIQUE: Multidetector CT imaging of the head and neck was performed using the standard protocol during bolus administration of intravenous contrast.  Multiplanar CT image reconstructions and MIPs were obtained to evaluate the vascular anatomy. Carotid stenosis measurements (when applicable) are obtained utilizing NASCET criteria, using the distal internal carotid diameter as the denominator. CONTRAST:  37mL OMNIPAQUE IOHEXOL 350 MG/ML SOLN COMPARISON:  Prior head CT from earlier the  same day. FINDINGS: CTA NECK FINDINGS Aortic arch: Partially visualized aortic arch of normal caliber. Origin of the right brachiocephalic and left common carotid arteries not visualized on this exam. Scattered atheromatous plaque within the visualized aortic arch and about the origin of the left subclavian artery without hemodynamically significant stenosis. Right carotid system: Eccentric calcified plaque about the origin of the right CCA with associated narrowing of up to approximately 40% by NASCET criteria. Right CCA mildly tortuous with scattered atheromatous plaque distally without high-grade stenosis. Bulky calcified plaque about the right bifurcation/proximal right ICA. Associated stenosis of up to approximately 50% about the origin of the right ICA distally, right ICA mildly tortuous with scattered atheromatous plaque without additional high-grade stenosis, dissection, or occlusion. Left carotid system: Origin of the left CCA not visible on this exam. Scattered atheromatous plaque throughout the visualized left CCA with mild multifocal narrowing. Bulky mixed plaque about the left bifurcation/proximal left ICA with associated stenosis of up to 50% by NASCET criteria. Additional atheromatous plaque within the distal left ICA with associated focal stenosis of up to 65% by NASCET criteria (series 7, image 148). Left ICA otherwise patent to the skull base. Vertebral arteries: Both vertebral arteries arise from the subclavian arteries. Right vertebral artery dominant. Mild scattered atheromatous plaque within the right V1 and V2 segments with associated mild multifocal stenosis.  Right vertebral otherwise widely patent to the skull base. Hypoplastic left vertebral artery occluded at its origin, and remains largely occluded within the neck. Distal reconstitution via muscular branches at the left V3 segment (series 7, image 163). Attenuated irregular flow seen within the hypoplastic left vertebral artery as it crosses into the cranial vault. Skeleton: Advanced compression deformity partially seen involving the T5 vertebral body with at least 80% height loss, age indeterminate, but new relative to recent CT from 01/16/2019. Irregular sclerotic lesion within the T1 vertebral body stable as compared to multiple previous exams, and presumably benign. No other discrete lytic or blastic osseous lesions. Moderate multilevel facet arthrosis noted within the cervical spine, left slightly worse than right. Degenerative changes noted about the TMJs bilaterally. Other neck: No other acute soft tissue abnormality within the neck. Prominent 1 cm left no noted adjacent to the left lobe of thyroid (series 5, image 31). Upper chest: Visualized upper chest demonstrates no acute finding. Upper lobe predominant centrilobular emphysema. Right-sided Port-A-Cath noted. Review of the MIP images confirms the above findings CTA HEAD FINDINGS Anterior circulation: Petrous right ICA widely patent. Focal atheromatous plaque within the petrous left ICA with associated short-segment mild stenosis. Extensive atheromatous plaque within the cavernous/supraclinoid ICAs with resultant moderate to severe diffuse stenosis (estimated 50-75% bilaterally, left worse than right). ICA termini well perfused. Right A1 patent. A hypoplastic left A1 appears occluded. Normal anterior communicating artery. Anterior cerebral arteries patent to their distal aspects without stenosis or occlusion. No M1 stenosis or occlusion. Normal MCA bifurcations. Distal MCA branches well perfused and symmetric. Posterior circulation: Focal plaque within the  dominant right vertebral artery as it crosses into the cranial vault without significant stenosis. Dominant right V4 segment otherwise patent to the vertebrobasilar junction. Patent right PICA. Hypoplastic left vertebral artery attenuated and irregular. Superimposed short-segment moderate to severe proximal left V4 stenosis prior to the takeoff of the left PICA (series 7, image 151). Additional short-segment moderate stenosis within the distal left V4 segment just prior to the vertebrobasilar junction. Left PICA patent. Basilar irregular but widely patent to its distal aspect. Superior cerebral arteries patent bilaterally. Both of  the posterior cerebral arteries primarily supplied via the basilar. PCAs demonstrate scattered multifocal atheromatous irregularity but are well perfused to their distal aspects without high-grade stenosis. Venous sinuses: Patent. Anatomic variants: None significant. Review of the MIP images confirms the above findings IMPRESSION: 1. Negative CTA for emergent large vessel occlusion. 2. Bulky atheromatous plaque about the origins of the internal carotid arteries with associated stenoses of up to 50% by NASCET criteria bilaterally. Additional short-segment 50% stenosis involving the distal left ICA as above. 3. Heavy atherosclerotic change throughout the carotid siphons with associated moderate to severe multifocal stenoses, left worse than right. 4. 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 Superimposed moderate to severe multifocal left V4 stenoses as above. 5. Hypoplastic and occluded left A1 segment. Anterior cerebral arteries supplied via the right carotid artery system. 6. Severe compression deformity involving the T5 vertebral body, age indeterminate, but new relative to most recent chest CT from 01/16/2019. Correlation with history and physical exam recommended. 7. Ill-defined sclerotic lesion within the T1  vertebral body, indeterminate, but stable relative to multiple previous exams. 8. Emphysema. Critical Value/emergent results were called by telephone at the time of interpretation on 03/18/2019 at 8:09 pm to Dr. Gareth Morgan , who verbally acknowledged these results. Electronically Signed   By: Jeannine Boga M.D.   On: 03/18/2019 20:09   Mr Jeri Cos OJ Contrast  Result Date: 03/19/2019 CLINICAL DATA:  Initial evaluation for acute visual changes, diplopia. EXAM: MRI HEAD WITHOUT AND WITH CONTRAST TECHNIQUE: Multiplanar, multiecho pulse sequences of the brain and surrounding structures were obtained without and with intravenous contrast. CONTRAST:  5 cc of Gadavist. COMPARISON:  Prior CT and CTA from earlier same day as well as previous brain MRI from 02/13/2019. FINDINGS: Brain: Examination severely degraded by motion artifact, limiting evaluation. 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 Underlying age-related cerebral atrophy with mild chronic microvascular ischemic disease. 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 No abnormal foci of restricted diffusion to suggest acute or subacute ischemia on today's exam. Previously seen focus of diffusion abnormality with associated ring enhancement within the left occipital lobe is not seen on today's exam, and appears to have resolved, suggesting that this finding represented a subacute infarct on prior study. No residual FLAIR signal abnormality now seen within this region. No other mass lesion or definite abnormal enhancement on today's exam. No midline shift or mass effect. No evidence for acute intracranial hemorrhage. No hydrocephalus. No extra-axial fluid collection. Diffuse enlargement with heterogeneous enhancement of the pituitary gland again noted, grossly similar, but better evaluated on recent brain MRI given technical limitations due to motion on this exam. Vascular: Major  intracranial vascular flow voids are maintained. Skull and upper cervical spine: Craniocervical junction grossly within normal limits. No definite focal marrow replacing lesion on this motion degraded exam. Scalp soft tissues demonstrate no acute finding. Sinuses/Orbits: Patient status post bilateral ocular lens replacement. Globes and orbital soft tissues demonstrate no acute finding. Paranasal sinuses are clear. No appreciable mastoid effusion. Inner ear structures grossly normal. Other: None. IMPRESSION: 1. Technically limited exam due to extensive motion artifact. 2. No acute intracranial infarct or other abnormality identified. 3. Apparent interval resolution of previously seen 6 mm ring-enhancing lesion within the left occipital lobe, which most likely reflected a subacute infarct on previous exam. No evidence for parenchymal mass or metastatic disease on today's study. Given the technical limitations and extensive motion  artifact on this exam, a short interval follow-up study in 4-6 weeks to confirm these findings is suggested. 4. Grossly similar appearance of enlarged pituitary gland, better seen on previous MRI. 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 Attention at follow-up recommended. Electronically Signed   By: Jeannine Boga M.D.   On: 03/19/2019 00:32   Ct Head Code Stroke Wo Contrast  Result Date: 03/18/2019 CLINICAL DATA:  Code stroke. Initial evaluation for acute visual changes, visual loss. EXAM: CT HEAD WITHOUT CONTRAST TECHNIQUE: Contiguous axial images were obtained from the base of the skull through the vertex without intravenous contrast. COMPARISON:  None. FINDINGS: Brain: Age-related cerebral atrophy with chronic microvascular ischemic disease. 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 No acute intracranial  hemorrhage. No acute large vessel territory infarct. No visible mass lesion, mass effect, or midline shift. No hydrocephalus. No extra-axial fluid collection. Vascular: No hyperdense vessel. Scattered vascular calcifications noted within the carotid siphons. Skull: Scalp soft tissues demonstrate no acute finding. Calvarium intact. Sinuses/Orbits: Globes and orbital soft tissues demonstrate no acute finding. Patient status post bilateral ocular lens replacement. Paranasal sinuses are clear. No mastoid effusion. Other: None. ASPECTS [facility] Stroke Program Early CT Score) - Ganglionic level infarction (caudate, lentiform nuclei, internal capsule, insula, M1-M3 cortex): 7 - Supraganglionic infarction (M4-M6 cortex): 3 Total score (0-10 with 10 being normal): 10 IMPRESSION: 1. No acute intracranial infarct or other abnormality identified. 2. ASPECTS is 10. 3. Right cerebral heterotopia, grossly stable, better evaluated on recent brain MRI. 4. 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 No other visible mass lesion. Critical Value/emergent results were called by telephone at the time of interpretation on 03/18/2019 at 7:30 pm to Dr. Gareth Morgan , who verbally acknowledged these results. Electronically Signed   By: Jeannine Boga M.D.   On: 03/18/2019 19:34    Procedures Procedures (including critical care time)  Medications Ordered in ED Medications  iohexol (OMNIPAQUE) 350 MG/ML injection 80 mL (80 mLs Intravenous Contrast Given 03/18/19 1915)  LORazepam (ATIVAN) injection 1.5 mg (1.5 mg Intravenous Given 03/18/19 2208)  LORazepam (ATIVAN) injection 0.5 mg (0.5 mg Intravenous Given 03/18/19 2241)  haloperidol lactate (HALDOL) injection 2 mg (2 mg Intravenous Given 03/18/19 2242)  gadobutrol (GADAVIST) 1 MMOL/ML injection 5 mL (5 mLs Intravenous Contrast Given 03/18/19 2304)  dexamethasone (DECADRON) injection 10 mg (10 mg Intravenous Given 03/18/19 2351)      Visual  Acuity  Right Eye Distance:   Left Eye Distance:   Bilateral Distance:    Right Eye Near:   Left Eye Near:    Bilateral Near:      Initial Impression / Assessment and Plan / ED Course  I have reviewed the triage vital signs and the nursing notes.  Pertinent labs & imaging results that were available during my care of the patient were reviewed by me and considered in my medical decision making (see chart for details).         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 Given pt within stroke window, Code Stroke called given acute visual changes.  DDx includes PRES, CVA, worsening metastatic disease.   CT and CT head without acute abnormalities.  Labs without acute changes.  Discussed incidental T5 fx, she does have pain in this location and will refer to [facility].  Tele Neuro recommends inpt admission and MRI, however pt does not want to be admitted. Agree if MRI does not show acute CVA she would be appropriate for outpt follow up and likely increase in her steroids.  Discussed with Dr. Rory Percy Neurology.    MR pending at time of signout to Dr. Christy Gentles.   Final Clinical Impressions(s) / ED Diagnoses   Final diagnoses:  Visual changes  Closed wedge compression fracture of fifth thoracic vertebra, initial encounter [facility])    ED Discharge Orders    None       Gareth Morgan, MD 03/19/19 1540

## 2019-03-18 NOTE — ED Triage Notes (Signed)
Pt had bilat eye itching. Daughter gave OTC anti-itch eye drops and then started having vision loss. Drops were given around 3pm today. Pt can see darkness to right side and light to left eye. Pt is getting chemo pills.

## 2019-03-18 NOTE — ED Provider Notes (Signed)
Pt with visual changes Plan to f/u on MRI, visual acuity and consult neuro   Ripley Fraise, MD 03/18/19 2327

## 2019-03-19 NOTE — ED Provider Notes (Signed)
EKG Interpretation  Date/Time:  Wednesday March 18 2019 19:34:29 EDT Ventricular Rate:  98 PR Interval:    QRS Duration: 84 QT Interval:  348 QTC Calculation: 445 R Axis:   -72 Text Interpretation:  Sinus rhythm LAD, consider left anterior fascicular block Probable anteroseptal infarct, old ST elevation, consider inferior injury Baseline wander in lead(s) II III aVR aVF V3 Confirmed by Ripley Fraise [phone]) on 03/18/2019 11:28:01 PM       Discussed MRI findings with Dr. Rory Percy with neurology.  He does not see any new stroke.  He feels patient can be discharged with follow-up with her oncologist. Patient otherwise stable in the ER   Ripley Fraise, MD 03/19/19 [phone]

## 2019-03-19 NOTE — ED Provider Notes (Signed)
Pt more awake at this time She is requesting d/c home    Ripley Fraise, MD 03/19/19 [phone]

## 2019-03-23 ENCOUNTER — Inpatient Hospital Stay (HOSPITAL_BASED_OUTPATIENT_CLINIC_OR_DEPARTMENT_OTHER): Payer: Medicare Other | Admitting: Physician Assistant

## 2019-03-23 ENCOUNTER — Other Ambulatory Visit: Payer: Self-pay

## 2019-03-23 ENCOUNTER — Encounter: Payer: Self-pay | Admitting: Physician Assistant

## 2019-03-23 ENCOUNTER — Inpatient Hospital Stay: Payer: Medicare Other

## 2019-03-23 ENCOUNTER — Telehealth: Payer: Self-pay | Admitting: Internal Medicine

## 2019-03-23 ENCOUNTER — Inpatient Hospital Stay: Payer: Medicare Other | Attending: Internal Medicine

## 2019-03-23 VITALS — BP 143/81 | HR 102 | Temp 98.7°F | Resp 16 | Ht 64.02 in | Wt 86.8 lb

## 2019-03-23 VITALS — HR 96

## 2019-03-23 DIAGNOSIS — Z7951 Long term (current) use of inhaled steroids: Secondary | ICD-10-CM | POA: Diagnosis not present

## 2019-03-23 DIAGNOSIS — C7931 Secondary malignant neoplasm of brain: Secondary | ICD-10-CM | POA: Insufficient documentation

## 2019-03-23 DIAGNOSIS — C3431 Malignant neoplasm of lower lobe, right bronchus or lung: Secondary | ICD-10-CM | POA: Diagnosis not present

## 2019-03-23 DIAGNOSIS — C3411 Malignant neoplasm of upper lobe, right bronchus or lung: Secondary | ICD-10-CM | POA: Diagnosis present

## 2019-03-23 DIAGNOSIS — I10 Essential (primary) hypertension: Secondary | ICD-10-CM | POA: Insufficient documentation

## 2019-03-23 DIAGNOSIS — Z87891 Personal history of nicotine dependence: Secondary | ICD-10-CM | POA: Diagnosis not present

## 2019-03-23 DIAGNOSIS — Z95828 Presence of other vascular implants and grafts: Secondary | ICD-10-CM

## 2019-03-23 DIAGNOSIS — J449 Chronic obstructive pulmonary disease, unspecified: Secondary | ICD-10-CM | POA: Diagnosis not present

## 2019-03-23 DIAGNOSIS — H532 Diplopia: Secondary | ICD-10-CM

## 2019-03-23 DIAGNOSIS — Z5112 Encounter for antineoplastic immunotherapy: Secondary | ICD-10-CM | POA: Diagnosis not present

## 2019-03-23 DIAGNOSIS — Z7982 Long term (current) use of aspirin: Secondary | ICD-10-CM | POA: Diagnosis not present

## 2019-03-23 DIAGNOSIS — Z5111 Encounter for antineoplastic chemotherapy: Secondary | ICD-10-CM | POA: Insufficient documentation

## 2019-03-23 DIAGNOSIS — Z79899 Other long term (current) drug therapy: Secondary | ICD-10-CM | POA: Diagnosis not present

## 2019-03-23 DIAGNOSIS — C7970 Secondary malignant neoplasm of unspecified adrenal gland: Secondary | ICD-10-CM | POA: Diagnosis not present

## 2019-03-23 DIAGNOSIS — C3412 Malignant neoplasm of upper lobe, left bronchus or lung: Secondary | ICD-10-CM | POA: Insufficient documentation

## 2019-03-23 DIAGNOSIS — Z5189 Encounter for other specified aftercare: Secondary | ICD-10-CM | POA: Insufficient documentation

## 2019-03-23 LAB — CMP (CANCER CENTER ONLY)
ALT: 21 U/L (ref 0–44)
AST: 13 U/L — ABNORMAL LOW (ref 15–41)
Albumin: 3.7 g/dL (ref 3.5–5.0)
Alkaline Phosphatase: 111 U/L (ref 38–126)
Anion gap: 10 (ref 5–15)
BUN: 8 mg/dL (ref 8–23)
CO2: 24 mmol/L (ref 22–32)
Calcium: 9.4 mg/dL (ref 8.9–10.3)
Chloride: 102 mmol/L (ref 98–111)
Creatinine: 0.56 mg/dL (ref 0.44–1.00)
GFR, Est AFR Am: >60 mL/min (ref >60)
GFR, Estimated: >60 mL/min (ref >60)
Glucose, Bld: 106 mg/dL — ABNORMAL HIGH (ref 70–99)
Potassium: 4.1 mmol/L (ref 3.5–5.1)
Sodium: 136 mmol/L (ref 135–145)
Total Bilirubin: 0.3 mg/dL (ref 0.3–1.2)
Total Protein: 6.4 g/dL — ABNORMAL LOW (ref 6.5–8.1)

## 2019-03-23 LAB — CBC WITH DIFFERENTIAL (CANCER CENTER ONLY)
Abs Immature Granulocytes: 0.31 10*3/uL — ABNORMAL HIGH (ref 0.00–0.07)
Basophils Absolute: 0.1 10*3/uL (ref 0.0–0.1)
Basophils Relative: 1 %
Eosinophils Absolute: 0 10*3/uL (ref 0.0–0.5)
Eosinophils Relative: 0 %
HCT: 38.2 % (ref 36.0–46.0)
Hemoglobin: 12.9 g/dL (ref 12.0–15.0)
Immature Granulocytes: 2 %
Lymphocytes Relative: 11 %
Lymphs Abs: 1.6 10*3/uL (ref 0.7–4.0)
MCH: 31.9 pg (ref 26.0–34.0)
MCHC: 33.8 g/dL (ref 30.0–36.0)
MCV: 94.3 fL (ref 80.0–100.0)
Monocytes Absolute: 1 10*3/uL (ref 0.1–1.0)
Monocytes Relative: 7 %
Neutro Abs: 12.2 10*3/uL — ABNORMAL HIGH (ref 1.7–7.7)
Neutrophils Relative %: 79 %
Platelet Count: 275 10*3/uL (ref 150–400)
RBC: 4.05 MIL/uL (ref 3.87–5.11)
RDW: 16.2 % — ABNORMAL HIGH (ref 11.5–15.5)
WBC Count: 15.2 10*3/uL — ABNORMAL HIGH (ref 4.0–10.5)
nRBC: 0 % (ref 0.0–0.2)

## 2019-03-23 MED ORDER — SODIUM CHLORIDE 0.9 % IV SOLN
1500.0000 mg | Freq: Once | INTRAVENOUS | Status: AC
  Administered 2019-03-23: 14:00:00 1500 mg via INTRAVENOUS
  Filled 2019-03-23: qty 30

## 2019-03-23 MED ORDER — SODIUM CHLORIDE 0.9 % IV SOLN
100.0000 mg/m2 | Freq: Once | INTRAVENOUS | Status: AC
  Administered 2019-03-23: 140 mg via INTRAVENOUS
  Filled 2019-03-23: qty 7

## 2019-03-23 MED ORDER — SODIUM CHLORIDE 0.9% FLUSH
10.0000 mL | INTRAVENOUS | Status: DC | PRN
  Administered 2019-03-23: 10 mL
  Filled 2019-03-23: qty 10

## 2019-03-23 MED ORDER — SODIUM CHLORIDE 0.9 % IV SOLN
Freq: Once | INTRAVENOUS | Status: AC
  Administered 2019-03-23: 12:00:00 via INTRAVENOUS
  Filled 2019-03-23: qty 250

## 2019-03-23 MED ORDER — HEPARIN SOD (PORK) LOCK FLUSH 100 UNIT/ML IV SOLN
500.0000 [IU] | Freq: Once | INTRAVENOUS | Status: AC | PRN
  Administered 2019-03-23: 500 [IU]
  Filled 2019-03-23: qty 5

## 2019-03-23 MED ORDER — PALONOSETRON HCL INJECTION 0.25 MG/5ML
0.2500 mg | Freq: Once | INTRAVENOUS | Status: AC
  Administered 2019-03-23: 0.25 mg via INTRAVENOUS

## 2019-03-23 MED ORDER — SODIUM CHLORIDE 0.9 % IV SOLN
Freq: Once | INTRAVENOUS | Status: AC
  Administered 2019-03-23: 13:00:00 via INTRAVENOUS
  Filled 2019-03-23: qty 5

## 2019-03-23 MED ORDER — SODIUM CHLORIDE 0.9% FLUSH
10.0000 mL | Freq: Once | INTRAVENOUS | Status: AC
  Administered 2019-03-23: 11:00:00 10 mL
  Filled 2019-03-23: qty 10

## 2019-03-23 MED ORDER — PALONOSETRON HCL INJECTION 0.25 MG/5ML
INTRAVENOUS | Status: AC
  Filled 2019-03-23: qty 5

## 2019-03-23 MED ORDER — SODIUM CHLORIDE 0.9 % IV SOLN
306.5000 mg | Freq: Once | INTRAVENOUS | Status: AC
  Administered 2019-03-23: 310 mg via INTRAVENOUS
  Filled 2019-03-23: qty 31

## 2019-03-23 NOTE — Patient Instructions (Signed)

## 2019-03-23 NOTE — Patient Instructions (Signed)
[facility] Discharge Instructions for Patients Receiving Chemotherapy  Today you received the following chemotherapy agents :  Durvalumab, Carboplatin and Etoposide.  To help prevent nausea and vomiting after your treatment, we encourage you to take your nausea medication as prescribed.  TAKE COMPAZINE 10 MG BY MOUTH EVERY 6 HOURS AS NEEDED FOR NAUSEA. DO NOT DRIVE AFTER TAKING THIS MEDICATION AS IT CAN CAUSE DROWSINESS.  DO DRINK LOTS OF FLUIDS AS TOLERATED.  DO NOT EAT GREASY NOR SPICY FOODS.   If you develop nausea and vomiting that is not controlled by your nausea medication, call the clinic.   BELOW ARE SYMPTOMS THAT SHOULD BE REPORTED IMMEDIATELY:  *FEVER GREATER THAN 100.5 F  *CHILLS WITH OR WITHOUT FEVER  NAUSEA AND VOMITING THAT IS NOT CONTROLLED WITH YOUR NAUSEA MEDICATION  *UNUSUAL SHORTNESS OF BREATH  *UNUSUAL BRUISING OR BLEEDING  TENDERNESS IN MOUTH AND THROAT WITH OR WITHOUT PRESENCE OF ULCERS  *URINARY PROBLEMS  *BOWEL PROBLEMS  UNUSUAL RASH Items with * indicate a potential emergency and should be followed up as soon as possible.  Feel free to call the clinic should you have any questions or concerns. The clinic phone number is [phone].  Please show the [place] at check-in to the Emergency Department and triage nurse.

## 2019-03-23 NOTE — Progress Notes (Addendum)
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 2) History of bilateral adenocarcinoma of the lung presenting with right upper lobe and left upper lobe pulmonary nodules  PRIOR THERAPY: Status post curative SBRT completed in January 2020.  CURRENT THERAPY: Systemic chemotherapy with carboplatin for AUC of 5 on day 1, etoposide 100 mg/M2 and Imfinzi 1500 mg IV every 3 weeks. First dose on 03/02/2019. Status post 1 cycle.  INTERVAL HISTORY: Pamela Russell 73 y.o. female returns to the clinic for a follow-up visit.  The patient is feeling fairly well today without any concerning complaints except for intermittent diplopia. She states that this has been occurring on and off for the last 3 weeks or so. Her symptoms occasionally persisit for several days. She denies any associated headaches, seizures, nausea/vomiting, or gait disturbances. She is not having any visual symptoms today. The patient was recently seen in the  emergency room for this concern on 03/18/2019. She had a repeat brain MRI performed which noted resolution of a previously seen 6 mm left occipital lobe ring-enhanced lesion and grossly similar appearing enlargement of the pituitary gland compared her her initial staging MRI in June 2020. She was scheduled to have a follow up brain MRI on 04/06/2019 by radiation oncology.   Otherwise she tolerated her first cycle of systemic chemotherapy and immunotherapy well without any adverse effects. She does report to some back pain today which she states is from a recent fracture from tripping over her dog a few weeks ago. She is taking ibuprofen for the pain. She denies any fever, chills, or night sweats.  She reports about 8 pound weight loss  since her last appointment which she attributes to "eating Russell salads and fruits". She denies any chest pain, shortness of breath, cough, or hemoptysis.  She denies any nausea, vomiting, diarrhea, or constipation.  She denies any rashes or skin changes.  She is here today for evaluation before starting cycle #2.  MEDICAL HISTORY: Past Medical History:  Diagnosis Date  . Allergic rhinitis   . Asthma 2008  . Chronic obstructive pulmonary disease (COPD) ([facility]) 06/08/2016   Meds: Spiriva, albuterol inhaler PFT (11/29/2016): Moderate-severe obstruction by PFT 11/2016  . Ear itching 07/05/2017  . Grieving 06/08/2016  . History of pneumonia    x4  . Hyperlipidemia 09/10/2016   On atorvastatin 40 mg QD, ASA 81 mg QD ASCVD risk 17.1%, mod-high intensity statin Risk factors: FHx sig for MI: mother 30s-50s, and father age 63, HTN, smoker  . Hypertension   . Night sweats 06/08/2016  . NSCL Ca dx'd 05/2018   xrt comp 09/2018  . Pulmonary nodules 06/12/2017  . Tobacco use disorder 07/06/2016   On nicotine replacement patch. Smoking cigs since age 25. Has 60 pack year history. Has COPD dx on SABA and LAMA.    ALLERGIES:  is allergic to codeine; penicillins; and anoro ellipta [umeclidinium-vilanterol].  MEDICATIONS:  Current Outpatient Medications  Medication Sig Dispense Refill  . albuterol (PROVENTIL) (2.5 MG/3ML) 0.083% nebulizer solution Take 2.5 mg by nebulization daily as needed for wheezing or shortness of breath.    Marland Kitchen albuterol (VENTOLIN HFA) 108 (90 Base) MCG/ACT inhaler USE 2 INHALATIONS BY MOUTH  EVERY 6 HOURS AS NEEDED FOR WHEEZING OR SHORTNESS OF  BREATH (Patient taking differently:  Inhale 2 puffs into the lungs every 6 (six) hours as needed for wheezing or shortness of breath. ) 17 g 2  . aspirin EC 81 MG tablet Take 81 mg by mouth daily.    . cetirizine (ZYRTEC) 10 MG tablet Take 10 mg by mouth daily.    . fluticasone (FLONASE) 50 MCG/ACT nasal spray Place 2 sprays into both nostrils  daily. (Patient taking differently: Place 2 sprays into both nostrils 2 (two) times a day. ) 16 g 1  . glucosamine-chondroitin 500-400 MG tablet Take 1 tablet by mouth daily.     . Glycopyrrolate-Formoterol (BEVESPI AEROSPHERE) 9-4.8 MCG/ACT AERO Inhale 2 puffs into the lungs 2 (two) times daily. 3 Inhaler 2  . lidocaine-prilocaine (EMLA) cream Apply 1 application topically daily as needed (port).     . Multiple Vitamin (MULTIVITAMIN WITH MINERALS) TABS tablet Take 1 tablet by mouth daily.    . prochlorperazine (COMPAZINE) 10 MG tablet TAKE 1 TABLET BY MOUTH EVERY 6 HOURS AS NEEDED FOR NAUSEA AND VOMITING (Patient taking differently: Take 10 mg by mouth 2 (two) times a day. ) 30 tablet 0   No current facility-administered medications for this visit.     SURGICAL HISTORY:  Past Surgical History:  Procedure Laterality Date  . CATARACT EXTRACTION Left 05/08/2016  . CATARACT EXTRACTION Right 05/22/2016  . IR IMAGING GUIDED PORT INSERTION  02/25/2019  . RADIOLOGY WITH ANESTHESIA N/A 02/13/2019   Procedure: BRAIN MRI;  Surgeon: Radiologist, Medication, MD;  Location: [facility];  Service: Radiology;  Laterality: N/A;  . TOTAL ABDOMINAL HYSTERECTOMY W/ BILATERAL SALPINGOOPHORECTOMY Bilateral 1975  . VIDEO BRONCHOSCOPY WITH ENDOBRONCHIAL NAVIGATION Left 07/09/2018   Procedure: VIDEO BRONCHOSCOPY WITH ENDOBRONCHIAL NAVIGATION;  Surgeon: Collene Gobble, MD;  Location: [facility];  Service: Thoracic;  Laterality: Left;    REVIEW OF SYSTEMS:   Review of Systems  Constitutional: Positive for unexpected weight loss. Negative for appetite change, chills, fatigue, and fever.  HENT: Negative for mouth sores, nosebleeds, sore throat and trouble swallowing.   Eyes: Positive for intermittent diplopia. Negative for icterus.  Respiratory: Negative for cough, hemoptysis, shortness of breath and wheezing.   Cardiovascular: Negative for chest pain and leg swelling.  Gastrointestinal: Negative for abdominal pain,  constipation, diarrhea, nausea and vomiting.  Genitourinary: Negative for bladder incontinence, difficulty urinating, dysuria, frequency and hematuria.   Musculoskeletal: Positive for mid-thoracic back pain secondary to a recent fall/fracture per patient report. Negative for gait problem, neck pain and neck stiffness.  Skin: Negative for itching and rash.  Neurological: Negative for dizziness, extremity weakness, gait problem, headaches, light-headedness and seizures.  Hematological: Negative for adenopathy. Does not bruise/bleed easily.  Psychiatric/Behavioral: Negative for confusion, depression and sleep disturbance. The patient is not nervous/anxious.     PHYSICAL EXAMINATION:  Blood pressure (!) 143/81, pulse (!) 102, temperature 98.7 F (37.1 C), temperature source Oral, resp. rate 16, height 5' 4.02" (1.626 m), weight 86 lb 12.8 oz (39.4 kg), SpO2 100 %.  ECOG PERFORMANCE STATUS: 1 - Symptomatic but completely ambulatory  Physical Exam  Constitutional: Oriented to person, place, and time and thin and chronically ill appearing female and in no distress.  HENT:  Head: Normocephalic and atraumatic.  Mouth/Throat: Oropharynx is clear and moist. No oropharyngeal exudate.  Eyes: Conjunctivae are normal. Right eye exhibits no discharge. Left eye exhibits no discharge. No scleral icterus.  Neck: Normal range of motion. Neck supple.  Cardiovascular: Normal rate, regular rhythm, normal heart sounds and intact distal pulses.   Pulmonary/Chest: Effort  normal and breath sounds normal but diminished. No respiratory distress. No wheezes. No rales.  Abdominal: Soft. Bowel sounds are normal. Exhibits no distension and no mass. There is no tenderness.  Musculoskeletal: Normal range of motion. Exhibits no edema.  Lymphadenopathy:    No cervical adenopathy.  Neurological: Alert and oriented to person, place, and time. Exhibits normal muscle tone. Gait normal. Coordination normal. Cranial nervew II-XII  grossly intact. Skin: Skin is warm and dry. No rash noted. Not diaphoretic. No erythema. No pallor.  Psychiatric: Mood, memory and judgment normal.  Vitals reviewed.  LABORATORY DATA: Lab Results  Component Value Date   WBC 15.2 (H) 03/23/2019   HGB 12.9 03/23/2019   HCT 38.2 03/23/2019   MCV 94.3 03/23/2019   PLT 275 03/23/2019      Chemistry      Component Value Date/Time   NA 136 03/23/2019 1055   NA 130 (L) 01/26/2019 1153   K 4.1 03/23/2019 1055   CL 102 03/23/2019 1055   CO2 24 03/23/2019 1055   BUN 8 03/23/2019 1055   BUN 5 (L) 01/26/2019 1153   CREATININE 0.56 03/23/2019 1055   CREATININE 0.69 06/08/2016 1128      Component Value Date/Time   CALCIUM 9.4 03/23/2019 1055   ALKPHOS 111 03/23/2019 1055   AST 13 (L) 03/23/2019 1055   ALT 21 03/23/2019 1055   BILITOT 0.3 03/23/2019 1055       RADIOGRAPHIC STUDIES:  Ct Angio Head W Or Wo Contrast  Result Date: 03/18/2019 CLINICAL DATA:  Initial evaluation for acute diplopia. EXAM: CT ANGIOGRAPHY HEAD AND NECK TECHNIQUE: Multidetector CT imaging of the head and neck was performed using the standard protocol during bolus administration of intravenous contrast. Multiplanar CT image reconstructions and MIPs were obtained to evaluate the vascular anatomy. Carotid stenosis measurements (when applicable) are obtained utilizing NASCET criteria, using the distal internal carotid diameter as the denominator. CONTRAST:  19mL OMNIPAQUE IOHEXOL 350 MG/ML SOLN COMPARISON:  Prior head CT from earlier the same day. FINDINGS: CTA NECK FINDINGS Aortic arch: Partially visualized aortic arch of normal caliber. Origin of the right brachiocephalic and left common carotid arteries not visualized on this exam. Scattered atheromatous plaque within the visualized aortic arch and about the origin of the left subclavian artery without hemodynamically significant stenosis. Right carotid system: Eccentric calcified plaque about the origin of the right  CCA with associated narrowing of up to approximately 40% by NASCET criteria. Right CCA mildly tortuous with scattered atheromatous plaque distally without high-grade stenosis. Bulky calcified plaque about the right bifurcation/proximal right ICA. Associated stenosis of up to approximately 50% about the origin of the right ICA distally, right ICA mildly tortuous with scattered atheromatous plaque without additional high-grade stenosis, dissection, or occlusion. Left carotid system: Origin of the left CCA not visible on this exam. Scattered atheromatous plaque throughout the visualized left CCA with mild multifocal narrowing. Bulky mixed plaque about the left bifurcation/proximal left ICA with associated stenosis of up to 50% by NASCET criteria. Additional atheromatous plaque within the distal left ICA with associated focal stenosis of up to 65% by NASCET criteria (series 7, image 148). Left ICA otherwise patent to the skull base. Vertebral arteries: Both vertebral arteries arise from the subclavian arteries. Right vertebral artery dominant. Mild scattered atheromatous plaque within the right V1 and V2 segments with associated mild multifocal stenosis. Right vertebral otherwise widely patent to the skull base. Hypoplastic left vertebral artery occluded at its origin, and remains largely occluded within the neck.  Distal reconstitution via muscular branches at the left V3 segment (series 7, image 163). Attenuated irregular flow seen within the hypoplastic left vertebral artery as it crosses into the cranial vault. Skeleton: Advanced compression deformity partially seen involving the T5 vertebral body with at least 80% height loss, age indeterminate, but new relative to recent CT from 01/16/2019. Irregular sclerotic lesion within the T1 vertebral body stable as compared to multiple previous exams, and presumably benign. No other discrete lytic or blastic osseous lesions. Moderate multilevel facet arthrosis noted within  the cervical spine, left slightly worse than right. Degenerative changes noted about the TMJs bilaterally. Other neck: No other acute soft tissue abnormality within the neck. Prominent 1 cm left no noted adjacent to the left lobe of thyroid (series 5, image 31). Upper chest: Visualized upper chest demonstrates no acute finding. Upper lobe predominant centrilobular emphysema. Right-sided Port-A-Cath noted. Review of the MIP images confirms the above findings CTA HEAD FINDINGS Anterior circulation: Petrous right ICA widely patent. Focal atheromatous plaque within the petrous left ICA with associated short-segment mild stenosis. Extensive atheromatous plaque within the cavernous/supraclinoid ICAs with resultant moderate to severe diffuse stenosis (estimated 50-75% bilaterally, left worse than right). ICA termini well perfused. Right A1 patent. A hypoplastic left A1 appears occluded. Normal anterior communicating artery. Anterior cerebral arteries patent to their distal aspects without stenosis or occlusion. No M1 stenosis or occlusion. Normal MCA bifurcations. Distal MCA branches well perfused and symmetric. Posterior circulation: Focal plaque within the dominant right vertebral artery as it crosses into the cranial vault without significant stenosis. Dominant right V4 segment otherwise patent to the vertebrobasilar junction. Patent right PICA. Hypoplastic left vertebral artery attenuated and irregular. Superimposed short-segment moderate to severe proximal left V4 stenosis prior to the takeoff of the left PICA (series 7, image 151). Additional short-segment moderate stenosis within the distal left V4 segment just prior to the vertebrobasilar junction. Left PICA patent. Basilar irregular but widely patent to its distal aspect. Superior cerebral arteries patent bilaterally. Both of the posterior cerebral arteries primarily supplied via the basilar. PCAs demonstrate scattered multifocal atheromatous irregularity but are  well perfused to their distal aspects without high-grade stenosis. Venous sinuses: Patent. Anatomic variants: None significant. Review of the MIP images confirms the above findings IMPRESSION: 1. Negative CTA for emergent large vessel occlusion. 2. Bulky atheromatous plaque about the origins of the internal carotid arteries with associated stenoses of up to 50% by NASCET criteria bilaterally. Additional short-segment 50% stenosis involving the distal left ICA as above. 3. Heavy atherosclerotic change throughout the carotid siphons with associated moderate to severe multifocal stenoses, left worse than right. 4. 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 Superimposed moderate to severe multifocal left V4 stenoses as above. 5. Hypoplastic and occluded left A1 segment. Anterior cerebral arteries supplied via the right carotid artery system. 6. Severe compression deformity involving the T5 vertebral body, age indeterminate, but new relative to most recent chest CT from 01/16/2019. Correlation with history and physical exam recommended. 7. Ill-defined sclerotic lesion within the T1 vertebral body, indeterminate, but stable relative to multiple previous exams. 8. Emphysema. Critical Value/emergent results were called by telephone at the time of interpretation on 03/18/2019 at 8:09 pm to Dr. Gareth Morgan , who verbally acknowledged these results. Electronically Signed   By: Jeannine Boga M.D.   On: 03/18/2019 20:09   Ct Angio Neck W And/or Wo Contrast  Result Date: 03/18/2019 CLINICAL DATA:  Initial evaluation for  acute diplopia. EXAM: CT ANGIOGRAPHY HEAD AND NECK TECHNIQUE: Multidetector CT imaging of the head and neck was performed using the standard protocol during bolus administration of intravenous contrast. Multiplanar CT image reconstructions and MIPs were obtained to evaluate the vascular anatomy. Carotid stenosis measurements (when  applicable) are obtained utilizing NASCET criteria, using the distal internal carotid diameter as the denominator. CONTRAST:  72mL OMNIPAQUE IOHEXOL 350 MG/ML SOLN COMPARISON:  Prior head CT from earlier the same day. FINDINGS: CTA NECK FINDINGS Aortic arch: Partially visualized aortic arch of normal caliber. Origin of the right brachiocephalic and left common carotid arteries not visualized on this exam. Scattered atheromatous plaque within the visualized aortic arch and about the origin of the left subclavian artery without hemodynamically significant stenosis. Right carotid system: Eccentric calcified plaque about the origin of the right CCA with associated narrowing of up to approximately 40% by NASCET criteria. Right CCA mildly tortuous with scattered atheromatous plaque distally without high-grade stenosis. Bulky calcified plaque about the right bifurcation/proximal right ICA. Associated stenosis of up to approximately 50% about the origin of the right ICA distally, right ICA mildly tortuous with scattered atheromatous plaque without additional high-grade stenosis, dissection, or occlusion. Left carotid system: Origin of the left CCA not visible on this exam. Scattered atheromatous plaque throughout the visualized left CCA with mild multifocal narrowing. Bulky mixed plaque about the left bifurcation/proximal left ICA with associated stenosis of up to 50% by NASCET criteria. Additional atheromatous plaque within the distal left ICA with associated focal stenosis of up to 65% by NASCET criteria (series 7, image 148). Left ICA otherwise patent to the skull base. Vertebral arteries: Both vertebral arteries arise from the subclavian arteries. Right vertebral artery dominant. Mild scattered atheromatous plaque within the right V1 and V2 segments with associated mild multifocal stenosis. Right vertebral otherwise widely patent to the skull base. Hypoplastic left vertebral artery occluded at its origin, and remains  largely occluded within the neck. Distal reconstitution via muscular branches at the left V3 segment (series 7, image 163). Attenuated irregular flow seen within the hypoplastic left vertebral artery as it crosses into the cranial vault. Skeleton: Advanced compression deformity partially seen involving the T5 vertebral body with at least 80% height loss, age indeterminate, but new relative to recent CT from 01/16/2019. Irregular sclerotic lesion within the T1 vertebral body stable as compared to multiple previous exams, and presumably benign. No other discrete lytic or blastic osseous lesions. Moderate multilevel facet arthrosis noted within the cervical spine, left slightly worse than right. Degenerative changes noted about the TMJs bilaterally. Other neck: No other acute soft tissue abnormality within the neck. Prominent 1 cm left no noted adjacent to the left lobe of thyroid (series 5, image 31). Upper chest: Visualized upper chest demonstrates no acute finding. Upper lobe predominant centrilobular emphysema. Right-sided Port-A-Cath noted. Review of the MIP images confirms the above findings CTA HEAD FINDINGS Anterior circulation: Petrous right ICA widely patent. Focal atheromatous plaque within the petrous left ICA with associated short-segment mild stenosis. Extensive atheromatous plaque within the cavernous/supraclinoid ICAs with resultant moderate to severe diffuse stenosis (estimated 50-75% bilaterally, left worse than right). ICA termini well perfused. Right A1 patent. A hypoplastic left A1 appears occluded. Normal anterior communicating artery. Anterior cerebral arteries patent to their distal aspects without stenosis or occlusion. No M1 stenosis or occlusion. Normal MCA bifurcations. Distal MCA branches well perfused and symmetric. Posterior circulation: Focal plaque within the dominant right vertebral artery as it crosses into the cranial vault without significant  stenosis. Dominant right V4 segment  otherwise patent to the vertebrobasilar junction. Patent right PICA. Hypoplastic left vertebral artery attenuated and irregular. Superimposed short-segment moderate to severe proximal left V4 stenosis prior to the takeoff of the left PICA (series 7, image 151). Additional short-segment moderate stenosis within the distal left V4 segment just prior to the vertebrobasilar junction. Left PICA patent. Basilar irregular but widely patent to its distal aspect. Superior cerebral arteries patent bilaterally. Both of the posterior cerebral arteries primarily supplied via the basilar. PCAs demonstrate scattered multifocal atheromatous irregularity but are well perfused to their distal aspects without high-grade stenosis. Venous sinuses: Patent. Anatomic variants: None significant. Review of the MIP images confirms the above findings IMPRESSION: 1. Negative CTA for emergent large vessel occlusion. 2. Bulky atheromatous plaque about the origins of the internal carotid arteries with associated stenoses of up to 50% by NASCET criteria bilaterally. Additional short-segment 50% stenosis involving the distal left ICA as above. 3. Heavy atherosclerotic change throughout the carotid siphons with associated moderate to severe multifocal stenoses, left worse than right. 4. 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 Superimposed moderate to severe multifocal left V4 stenoses as above. 5. Hypoplastic and occluded left A1 segment. Anterior cerebral arteries supplied via the right carotid artery system. 6. Severe compression deformity involving the T5 vertebral body, age indeterminate, but new relative to most recent chest CT from 01/16/2019. Correlation with history and physical exam recommended. 7. Ill-defined sclerotic lesion within the T1 vertebral body, indeterminate, but stable relative to multiple previous exams. 8. Emphysema. Critical Value/emergent results were  called by telephone at the time of interpretation on 03/18/2019 at 8:09 pm to Dr. Gareth Morgan , who verbally acknowledged these results. Electronically Signed   By: Jeannine Boga M.D.   On: 03/18/2019 20:09   Mr Jeri Cos QQ Contrast  Result Date: 03/19/2019 CLINICAL DATA:  Initial evaluation for acute visual changes, diplopia. EXAM: MRI HEAD WITHOUT AND WITH CONTRAST TECHNIQUE: Multiplanar, multiecho pulse sequences of the brain and surrounding structures were obtained without and with intravenous contrast. CONTRAST:  5 cc of Gadavist. COMPARISON:  Prior CT and CTA from earlier same day as well as previous brain MRI from 02/13/2019. FINDINGS: Brain: Examination severely degraded by motion artifact, limiting evaluation. 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 Underlying age-related cerebral atrophy with mild chronic microvascular ischemic disease. 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 No abnormal foci of restricted diffusion to suggest acute or subacute ischemia on today's exam. Previously seen focus of diffusion abnormality with associated ring enhancement within the left occipital lobe is not seen on today's exam, and appears to have resolved, suggesting that this finding represented a subacute infarct on prior study. No residual FLAIR signal abnormality now seen within this region. No other mass lesion or definite abnormal enhancement on today's exam. No midline shift or mass effect. No evidence for acute intracranial hemorrhage. No hydrocephalus. No extra-axial fluid collection. Diffuse enlargement with heterogeneous enhancement of the pituitary gland again noted, grossly similar, but better evaluated on recent brain MRI given technical limitations due to motion on this exam. Vascular: Major intracranial vascular flow voids are maintained. Skull and upper cervical spine: Craniocervical junction grossly within normal limits.  No definite focal marrow replacing lesion on this motion degraded exam. Scalp soft tissues demonstrate no acute finding. Sinuses/Orbits: Patient status post bilateral ocular lens replacement. Globes and orbital soft tissues demonstrate no acute finding.  Paranasal sinuses are clear. No appreciable mastoid effusion. Inner ear structures grossly normal. Other: None. IMPRESSION: 1. Technically limited exam due to extensive motion artifact. 2. No acute intracranial infarct or other abnormality identified. 3. Apparent interval resolution of previously seen 6 mm ring-enhancing lesion within the left occipital lobe, which most likely reflected a subacute infarct on previous exam. No evidence for parenchymal mass or metastatic disease on today's study. Given the technical limitations and extensive motion artifact on this exam, a short interval follow-up study in 4-6 weeks to confirm these findings is suggested. 4. Grossly similar appearance of enlarged pituitary gland, better seen on previous MRI. 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 Attention at follow-up recommended. Electronically Signed   By: Jeannine Boga M.D.   On: 03/19/2019 00:32   Ct Head Code Stroke Wo Contrast  Result Date: 03/18/2019 CLINICAL DATA:  Code stroke. Initial evaluation for acute visual changes, visual loss. EXAM: CT HEAD WITHOUT CONTRAST TECHNIQUE: Contiguous axial images were obtained from the base of the skull through the vertex without intravenous contrast. COMPARISON:  None. FINDINGS: Brain: Age-related cerebral atrophy with chronic microvascular ischemic disease. 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 No acute intracranial hemorrhage. No acute large vessel territory infarct. No visible mass lesion, mass effect, or midline shift. No hydrocephalus. No  extra-axial fluid collection. Vascular: No hyperdense vessel. Scattered vascular calcifications noted within the carotid siphons. Skull: Scalp soft tissues demonstrate no acute finding. Calvarium intact. Sinuses/Orbits: Globes and orbital soft tissues demonstrate no acute finding. Patient status post bilateral ocular lens replacement. Paranasal sinuses are clear. No mastoid effusion. Other: None. ASPECTS [facility] Stroke Program Early CT Score) - Ganglionic level infarction (caudate, lentiform nuclei, internal capsule, insula, M1-M3 cortex): 7 - Supraganglionic infarction (M4-M6 cortex): 3 Total score (0-10 with 10 being normal): 10 IMPRESSION: 1. No acute intracranial infarct or other abnormality identified. 2. ASPECTS is 10. 3. Right cerebral heterotopia, grossly stable, better evaluated on recent brain MRI. 4. 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 No other visible mass lesion. Critical Value/emergent results were called by telephone at the time of interpretation on 03/18/2019 at 7:30 pm to Dr. Gareth Morgan , who verbally acknowledged these results. Electronically Signed   By: Jeannine Boga M.D.   On: 03/18/2019 19:34   Ir Imaging Guided Port Insertion  Result Date: 02/25/2019 CLINICAL DATA:  Metastatic small cell lung cancer EXAM: RIGHT INTERNAL JUGULAR SINGLE LUMEN POWER PORT CATHETER INSERTION Date:  02/25/2019 02/25/2019 2:42 pm Radiologist:  Jerilynn Mages. Daryll Brod, MD Guidance:  Ultrasound and fluoroscopic MEDICATIONS: Ancef 2 g; The antibiotic was administered within an appropriate time interval prior to skin puncture. ANESTHESIA/SEDATION: Versed 3.0 mg IV; Fentanyl 100 mcg IV; Moderate Sedation Time:  23 minutes The patient was continuously monitored during the procedure by the interventional radiology nurse under my direct supervision. FLUOROSCOPY TIME:  One minutes, 0 seconds (4 mGy) COMPLICATIONS: None immediate. CONTRAST:  None. PROCEDURE: Informed consent was  obtained from the patient following explanation of the procedure, risks, benefits and alternatives. The patient understands, agrees and consents for the procedure. All questions were addressed. A time out was performed. Maximal barrier sterile technique utilized including caps, mask, sterile gowns, sterile gloves, large sterile drape, hand hygiene, and 2% chlorhexidine scrub. Under sterile conditions and local anesthesia, right internal jugular micropuncture venous access was performed. Access was performed with ultrasound.  Images were obtained for documentation of the patent right internal jugular vein. A guide wire was inserted followed by a transitional dilator. This allowed insertion of a guide wire and catheter into the IVC. Measurements were obtained from the SVC / RA junction back to the right IJ venotomy site. In the right infraclavicular chest, a subcutaneous pocket was created over the second anterior rib. This was done under sterile conditions and local anesthesia. 1% lidocaine with epinephrine was utilized for this. A 2.5 cm incision was made in the skin. Blunt dissection was performed to create a subcutaneous pocket over the right pectoralis major muscle. The pocket was flushed with saline vigorously. There was adequate hemostasis. The port catheter was assembled and checked for leakage. The port catheter was secured in the pocket with two retention sutures. The tubing was tunneled subcutaneously to the right venotomy site and inserted into the SVC/RA junction through a valved peel-away sheath. Position was confirmed with fluoroscopy. Images were obtained for documentation. The patient tolerated the procedure well. No immediate complications. Incisions were closed in a two layer fashion with 4 - 0 Vicryl suture. Dermabond was applied to the skin. The port catheter was accessed, blood was aspirated followed by saline and heparin flushes. Needle was removed. A dry sterile dressing was applied. IMPRESSION:  Ultrasound and fluoroscopically guided right internal jugular single lumen power port catheter insertion. Tip in the SVC/RA junction. Catheter ready for use. Electronically Signed   By: Jerilynn Mages.  Shick M.D.   On: 02/25/2019 14:45     ASSESSMENT/PLAN:  This is a very pleasant 73 year old Caucasian female with extensive stage small cell lung cancer.  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  She was diagnosed in June 2020.  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  She is status post curative SBRT in January of 2020.   She is currently undergoing systemic chemotherapy with carboplatin for an AUC of 5 on day 1, etoposide 100 mg/m on days 1, 2, and 3, and Imfinzi 1500 mg IV every 3 weeks with Neulasta support.  She is status post her first cycle of treatment she tolerated it well without any adverse effects.   The patient was seen with Dr. Julien Nordmann today.  Labs were reviewed.  We recommend that she proceed with cycle #2 today scheduled.  We will see the patient back for follow-up visit in 3 weeks for evaluation before starting cycle #3.  The patient is scheduled to have a PET scan performed later this week.   Regarding the patient's weight loss, the patient was encouraged to increase her caloric intake. If she continues to lose weight, we will consider referring the patient to our nutritionist for further evaluation of her condition.  Regarding the patient's recent repeat brain MRI performed in the ER, I will reach out to radiation oncology to determine if they need to modify her follow-up schedule and planned imaging study which was scheduled for 04/06/2019.   The patient was advised to call immediately if she has any concerning symptoms in the interval. The patient voices understanding of current disease status and treatment  options and is in agreement with the current care plan. All questions were answered. The patient knows to call the clinic with any problems, questions or concerns. We can certainly see the patient much sooner if necessary  No orders of  the defined types were placed in this encounter.    Cassandra L Heilingoetter, PA-C 03/23/19   ADDENDUM: Hematology/Oncology Attending: I had a face-to-face encounter with the patient today.  I recommended her care plan.  This is a very pleasant 73 years old white female with extensive stage small cell lung cancer.  She is currently undergoing treatment with systemic chemotherapy with carboplatin and etoposide status post 1 cycle.  She tolerated the first cycle of her treatment well with no concerning adverse effects except for mild fatigue and she lost few pounds. I recommended for her to proceed with cycle #2 today as planned. For the suspicious lesions in the brain, she is followed by radiation oncology. We will see the patient back for follow-up visit in 3 weeks and she is scheduled to have a PET scan during this.. She was advised to call immediately if she has any concerning symptoms in the interval.  Disclaimer: This note was dictated with voice recognition software. Similar sounding words can inadvertently be transcribed and may be missed upon review. Eilleen Kempf, MD 03/23/19

## 2019-03-23 NOTE — Telephone Encounter (Signed)
Scheduled appt per 8/03 los - pt to get an updated schedule next visit.

## 2019-03-23 NOTE — Progress Notes (Signed)
PT stayed accessed due to numerous treatments this week.

## 2019-03-24 ENCOUNTER — Other Ambulatory Visit: Payer: Self-pay

## 2019-03-24 ENCOUNTER — Inpatient Hospital Stay: Payer: Medicare Other

## 2019-03-24 VITALS — BP 132/75 | HR 99 | Temp 98.9°F | Resp 20

## 2019-03-24 DIAGNOSIS — Z5112 Encounter for antineoplastic immunotherapy: Secondary | ICD-10-CM | POA: Diagnosis not present

## 2019-03-24 DIAGNOSIS — C3431 Malignant neoplasm of lower lobe, right bronchus or lung: Secondary | ICD-10-CM

## 2019-03-24 MED ORDER — SODIUM CHLORIDE 0.9 % IV SOLN
Freq: Once | INTRAVENOUS | Status: AC
  Administered 2019-03-24: 15:00:00 via INTRAVENOUS
  Filled 2019-03-24: qty 250

## 2019-03-24 MED ORDER — HEPARIN SOD (PORK) LOCK FLUSH 100 UNIT/ML IV SOLN
500.0000 [IU] | Freq: Once | INTRAVENOUS | Status: AC | PRN
  Administered 2019-03-24: 500 [IU]
  Filled 2019-03-24: qty 5

## 2019-03-24 MED ORDER — SODIUM CHLORIDE 0.9 % IV SOLN
100.0000 mg/m2 | Freq: Once | INTRAVENOUS | Status: AC
  Administered 2019-03-24: 140 mg via INTRAVENOUS
  Filled 2019-03-24: qty 7

## 2019-03-24 MED ORDER — SODIUM CHLORIDE 0.9% FLUSH
10.0000 mL | INTRAVENOUS | Status: DC | PRN
  Administered 2019-03-24: 10 mL
  Filled 2019-03-24: qty 10

## 2019-03-24 MED ORDER — DEXAMETHASONE SODIUM PHOSPHATE 10 MG/ML IJ SOLN
10.0000 mg | Freq: Once | INTRAMUSCULAR | Status: AC
  Administered 2019-03-24: 10 mg via INTRAVENOUS

## 2019-03-24 MED ORDER — DEXAMETHASONE SODIUM PHOSPHATE 10 MG/ML IJ SOLN
INTRAMUSCULAR | Status: AC
  Filled 2019-03-24: qty 1

## 2019-03-24 NOTE — Patient Instructions (Signed)
[place] Discharge Instructions for Patients Receiving Chemotherapy  Today you received the following chemotherapy agents: Etoposide.  To help prevent nausea and vomiting after your treatment, we encourage you to take your nausea medication as prescribed.  TAKE COMPAZINE 10 MG BY MOUTH EVERY 6 HOURS AS NEEDED FOR NAUSEA. DO NOT DRIVE AFTER TAKING THIS MEDICATION AS IT CAN CAUSE DROWSINESS.  DO DRINK LOTS OF FLUIDS AS TOLERATED.  DO NOT EAT GREASY NOR SPICY FOODS.   If you develop nausea and vomiting that is not controlled by your nausea medication, call the clinic.   BELOW ARE SYMPTOMS THAT SHOULD BE REPORTED IMMEDIATELY:  *FEVER GREATER THAN 100.5 F  *CHILLS WITH OR WITHOUT FEVER  NAUSEA AND VOMITING THAT IS NOT CONTROLLED WITH YOUR NAUSEA MEDICATION  *UNUSUAL SHORTNESS OF BREATH  *UNUSUAL BRUISING OR BLEEDING  TENDERNESS IN MOUTH AND THROAT WITH OR WITHOUT PRESENCE OF ULCERS  *URINARY PROBLEMS  *BOWEL PROBLEMS  UNUSUAL RASH Items with * indicate a potential emergency and should be followed up as soon as possible.  Feel free to call the clinic should you have any questions or concerns. The clinic phone number is [phone].  Please show the [place] at check-in to the Emergency Department and triage nurse.

## 2019-03-25 ENCOUNTER — Telehealth: Payer: Self-pay | Admitting: *Deleted

## 2019-03-25 ENCOUNTER — Telehealth: Payer: Self-pay | Admitting: Radiation Oncology

## 2019-03-25 ENCOUNTER — Other Ambulatory Visit: Payer: Self-pay

## 2019-03-25 ENCOUNTER — Inpatient Hospital Stay: Payer: Medicare Other

## 2019-03-25 VITALS — BP 176/76 | HR 99 | Temp 98.0°F | Resp 20

## 2019-03-25 DIAGNOSIS — C7931 Secondary malignant neoplasm of brain: Secondary | ICD-10-CM

## 2019-03-25 DIAGNOSIS — E236 Other disorders of pituitary gland: Secondary | ICD-10-CM

## 2019-03-25 DIAGNOSIS — Z5112 Encounter for antineoplastic immunotherapy: Secondary | ICD-10-CM | POA: Diagnosis not present

## 2019-03-25 DIAGNOSIS — C3431 Malignant neoplasm of lower lobe, right bronchus or lung: Secondary | ICD-10-CM

## 2019-03-25 DIAGNOSIS — C7949 Secondary malignant neoplasm of other parts of nervous system: Secondary | ICD-10-CM

## 2019-03-25 MED ORDER — DEXAMETHASONE SODIUM PHOSPHATE 10 MG/ML IJ SOLN
10.0000 mg | Freq: Once | INTRAMUSCULAR | Status: AC
  Administered 2019-03-25: 15:00:00 10 mg via INTRAVENOUS

## 2019-03-25 MED ORDER — SODIUM CHLORIDE 0.9 % IV SOLN
100.0000 mg/m2 | Freq: Once | INTRAVENOUS | Status: AC
  Administered 2019-03-25: 140 mg via INTRAVENOUS
  Filled 2019-03-25: qty 7

## 2019-03-25 MED ORDER — DEXAMETHASONE SODIUM PHOSPHATE 10 MG/ML IJ SOLN
INTRAMUSCULAR | Status: AC
  Filled 2019-03-25: qty 1

## 2019-03-25 MED ORDER — SODIUM CHLORIDE 0.9% FLUSH
10.0000 mL | INTRAVENOUS | Status: DC | PRN
  Filled 2019-03-25: qty 10

## 2019-03-25 MED ORDER — HEPARIN SOD (PORK) LOCK FLUSH 100 UNIT/ML IV SOLN
500.0000 [IU] | Freq: Once | INTRAVENOUS | Status: DC | PRN
  Filled 2019-03-25: qty 5

## 2019-03-25 MED ORDER — SODIUM CHLORIDE 0.9 % IV SOLN
Freq: Once | INTRAVENOUS | Status: AC
  Administered 2019-03-25: 15:00:00 via INTRAVENOUS
  Filled 2019-03-25: qty 250

## 2019-03-25 NOTE — Telephone Encounter (Signed)
Received vm message from pt's daughter, Mont Dutton, requesting a call back this morning. TCT Sissy and spoke with her.  She states that her mother saw Cassie, PA earlier in the week andwas told  To contact a physician regarding insomnia issues, fidgety feelings and some confusion.  Suggested that it may be the radiation oncologist, Dr. Lisbeth Renshaw.  Sissy agreed that it was likely him.  She has also spoken with Shona Simpson, PA in RadOnc as well.  Sissy states she will contact them about neurological symptoms pt is having.   No other questions or concerns at this time.

## 2019-03-25 NOTE — Telephone Encounter (Signed)
I spoke with the patient and her daughter by phone today. We reviewed the updates since our last discussion:  She has been seen by PCP and repeat TSH testing showed normal values and they did not see the need to treat for hyperthyroidism. She had been having some clinical features suspicious though for hyperthyroid prior to her cancer diagnosis. She continues to have episodes of shakiness, nervousness and insomnia.  She has been off steroids for the last week and has had episodes of difficulty with vision in both eyes, left greater than right, but this was predating her dexamethasone cessation. She continues to have episodes of left eye twitching when she is nervous or stressed. She also denies any apraxia of the eyelid as previously noted, but did go to the ED on 03/18/2019 due to loss of visual acuity in both eyes and only retaining discriminating light from dark at that time. An MRI at that time showed some motion artifact. She also had resolution of the 6 mm ring enhancement in the left occipital lobe, but persistence of the lesion in her pituitary gland. It is still unclear as to the pathology in the sellar area.  She has a history of cataracts but does not recall who she was treated by in ophthalmology. Her daughter is going to find out from her optometrist Dr. Leeanne Deed in [facility] and try to get an appointment with the ophthalmologist.   Dr. Mickeal Skinner and Dr. Lisbeth Renshaw have discussed her symptoms and Dr. Mickeal Skinner would like to see her to examine her formally. Referral was placed. He also recommends cancelling her upcoming MRI on 8/18 with anesthesia. We will follow up with Dr. Thad Ranger and with Dr. Mickeal Skinner to determine when she would be ready for CNS treatment.

## 2019-03-25 NOTE — Patient Instructions (Signed)
[place] Discharge Instructions for Patients Receiving Chemotherapy  Today you received the following chemotherapy agents: Etoposide.  To help prevent nausea and vomiting after your treatment, we encourage you to take your nausea medication as prescribed.  TAKE COMPAZINE 10 MG BY MOUTH EVERY 6 HOURS AS NEEDED FOR NAUSEA. DO NOT DRIVE AFTER TAKING THIS MEDICATION AS IT CAN CAUSE DROWSINESS.  DO DRINK LOTS OF FLUIDS AS TOLERATED.  DO NOT EAT GREASY NOR SPICY FOODS.   If you develop nausea and vomiting that is not controlled by your nausea medication, call the clinic.   BELOW ARE SYMPTOMS THAT SHOULD BE REPORTED IMMEDIATELY:  *FEVER GREATER THAN 100.5 F  *CHILLS WITH OR WITHOUT FEVER  NAUSEA AND VOMITING THAT IS NOT CONTROLLED WITH YOUR NAUSEA MEDICATION  *UNUSUAL SHORTNESS OF BREATH  *UNUSUAL BRUISING OR BLEEDING  TENDERNESS IN MOUTH AND THROAT WITH OR WITHOUT PRESENCE OF ULCERS  *URINARY PROBLEMS  *BOWEL PROBLEMS  UNUSUAL RASH Items with * indicate a potential emergency and should be followed up as soon as possible.  Feel free to call the clinic should you have any questions or concerns. The clinic phone number is [phone].  Please show the [place] at check-in to the Emergency Department and triage nurse.

## 2019-03-26 ENCOUNTER — Other Ambulatory Visit: Payer: Self-pay | Admitting: Internal Medicine

## 2019-03-26 ENCOUNTER — Encounter (HOSPITAL_COMMUNITY)
Admission: RE | Admit: 2019-03-26 | Discharge: 2019-03-26 | Disposition: A | Discharge status: Home / Self Care | Payer: Medicare Other | Source: Ambulatory Visit | Attending: Internal Medicine | Admitting: Internal Medicine

## 2019-03-26 ENCOUNTER — Telehealth: Payer: Self-pay | Admitting: *Deleted

## 2019-03-26 DIAGNOSIS — I10 Essential (primary) hypertension: Secondary | ICD-10-CM | POA: Insufficient documentation

## 2019-03-26 DIAGNOSIS — E785 Hyperlipidemia, unspecified: Secondary | ICD-10-CM | POA: Diagnosis not present

## 2019-03-26 DIAGNOSIS — R911 Solitary pulmonary nodule: Secondary | ICD-10-CM | POA: Insufficient documentation

## 2019-03-26 DIAGNOSIS — R591 Generalized enlarged lymph nodes: Secondary | ICD-10-CM | POA: Diagnosis not present

## 2019-03-26 DIAGNOSIS — C3431 Malignant neoplasm of lower lobe, right bronchus or lung: Secondary | ICD-10-CM | POA: Diagnosis not present

## 2019-03-26 DIAGNOSIS — Z7982 Long term (current) use of aspirin: Secondary | ICD-10-CM | POA: Insufficient documentation

## 2019-03-26 DIAGNOSIS — Z79899 Other long term (current) drug therapy: Secondary | ICD-10-CM | POA: Insufficient documentation

## 2019-03-26 DIAGNOSIS — J449 Chronic obstructive pulmonary disease, unspecified: Secondary | ICD-10-CM | POA: Insufficient documentation

## 2019-03-26 LAB — GLUCOSE, CAPILLARY: Glucose-Capillary: 93 mg/dL (ref 70–99)

## 2019-03-26 MED ORDER — FLUDEOXYGLUCOSE F - 18 (FDG) INJECTION
4.9000 | Freq: Once | INTRAVENOUS | Status: AC | PRN
  Administered 2019-03-26: 4.9 via INTRAVENOUS

## 2019-03-26 NOTE — Telephone Encounter (Signed)
Received vm message from pt's daughter, Mont Dutton regarding appt pt has for tomorrow. TCT Sissy and spoke with her.  She stats she thinks tomorrow's appt is for a port flush and does her mom need to have that.  Reviewed appts with Sissy. Advised that appt tomorrow is for [facility] and she does need to come for that.  Sissy voiced understanding.  No further questions or concerns.

## 2019-03-27 ENCOUNTER — Other Ambulatory Visit: Payer: Self-pay | Admitting: *Deleted

## 2019-03-27 ENCOUNTER — Other Ambulatory Visit: Payer: Self-pay

## 2019-03-27 ENCOUNTER — Inpatient Hospital Stay: Payer: Medicare Other

## 2019-03-27 VITALS — BP 142/72 | HR 99 | Temp 98.2°F | Resp 19

## 2019-03-27 DIAGNOSIS — Z5112 Encounter for antineoplastic immunotherapy: Secondary | ICD-10-CM | POA: Diagnosis not present

## 2019-03-27 DIAGNOSIS — C3431 Malignant neoplasm of lower lobe, right bronchus or lung: Secondary | ICD-10-CM

## 2019-03-27 MED ORDER — PEGFILGRASTIM-CBQV 6 MG/0.6ML ~~LOC~~ SOSY
6.0000 mg | PREFILLED_SYRINGE | Freq: Once | SUBCUTANEOUS | Status: AC
  Administered 2019-03-27: 14:00:00 6 mg via SUBCUTANEOUS

## 2019-03-27 MED ORDER — PEGFILGRASTIM-CBQV 6 MG/0.6ML ~~LOC~~ SOSY
PREFILLED_SYRINGE | SUBCUTANEOUS | Status: AC
  Filled 2019-03-27: qty 0.6

## 2019-03-27 NOTE — Patient Instructions (Signed)

## 2019-03-28 ENCOUNTER — Emergency Department (HOSPITAL_COMMUNITY)
Admission: EM | Admit: 2019-03-28 | Discharge: 2019-03-28 | Disposition: A | Discharge status: Home / Self Care | Payer: Medicare Other | Attending: Emergency Medicine | Admitting: Emergency Medicine

## 2019-03-28 ENCOUNTER — Emergency Department (HOSPITAL_COMMUNITY): Payer: Medicare Other

## 2019-03-28 ENCOUNTER — Other Ambulatory Visit: Payer: Self-pay

## 2019-03-28 ENCOUNTER — Encounter (HOSPITAL_COMMUNITY): Payer: Self-pay

## 2019-03-28 DIAGNOSIS — J441 Chronic obstructive pulmonary disease with (acute) exacerbation: Secondary | ICD-10-CM | POA: Diagnosis not present

## 2019-03-28 DIAGNOSIS — Z7982 Long term (current) use of aspirin: Secondary | ICD-10-CM | POA: Insufficient documentation

## 2019-03-28 DIAGNOSIS — I10 Essential (primary) hypertension: Secondary | ICD-10-CM | POA: Diagnosis not present

## 2019-03-28 DIAGNOSIS — R079 Chest pain, unspecified: Secondary | ICD-10-CM | POA: Diagnosis present

## 2019-03-28 DIAGNOSIS — Z87891 Personal history of nicotine dependence: Secondary | ICD-10-CM | POA: Diagnosis not present

## 2019-03-28 DIAGNOSIS — Z79899 Other long term (current) drug therapy: Secondary | ICD-10-CM | POA: Diagnosis not present

## 2019-03-28 LAB — CBC
HCT: 41.7 % (ref 36.0–46.0)
Hemoglobin: 13.5 g/dL (ref 12.0–15.0)
MCH: 31.4 pg (ref 26.0–34.0)
MCHC: 32.4 g/dL (ref 30.0–36.0)
MCV: 97 fL (ref 80.0–100.0)
Platelets: 289 10*3/uL (ref 150–400)
RBC: 4.3 MIL/uL (ref 3.87–5.11)
RDW: 16.5 % — ABNORMAL HIGH (ref 11.5–15.5)
WBC: 45.3 10*3/uL — ABNORMAL HIGH (ref 4.0–10.5)
nRBC: 0 % (ref 0.0–0.2)

## 2019-03-28 LAB — BASIC METABOLIC PANEL
Anion gap: 10 (ref 5–15)
BUN: 11 mg/dL (ref 8–23)
CO2: 25 mmol/L (ref 22–32)
Calcium: 9.4 mg/dL (ref 8.9–10.3)
Chloride: 100 mmol/L (ref 98–111)
Creatinine, Ser: 0.42 mg/dL — ABNORMAL LOW (ref 0.44–1.00)
GFR calc Af Amer: >60 mL/min (ref >60)
GFR calc non Af Amer: >60 mL/min (ref >60)
Glucose, Bld: 98 mg/dL (ref 70–99)
Potassium: 4 mmol/L (ref 3.5–5.1)
Sodium: 135 mmol/L (ref 135–145)

## 2019-03-28 MED ORDER — ALBUTEROL SULFATE HFA 108 (90 BASE) MCG/ACT IN AERS
6.0000 | INHALATION_SPRAY | Freq: Once | RESPIRATORY_TRACT | Status: AC
  Administered 2019-03-28: 6 via RESPIRATORY_TRACT
  Filled 2019-03-28: qty 6.7

## 2019-03-28 MED ORDER — SODIUM CHLORIDE 0.9% FLUSH: 3.0000 mL | Freq: Once | INTRAVENOUS | Status: DC

## 2019-03-28 MED ORDER — IPRATROPIUM BROMIDE HFA 17 MCG/ACT IN AERS
2.0000 | INHALATION_SPRAY | Freq: Once | RESPIRATORY_TRACT | Status: AC
  Administered 2019-03-28: 2 via RESPIRATORY_TRACT
  Filled 2019-03-28: qty 12.9

## 2019-03-28 NOTE — ED Notes (Signed)
Pt transported to Xray. 

## 2019-03-28 NOTE — ED Triage Notes (Signed)
Pt reports chest pain that she describes as "tightening" that started about 1 hour ago. She states that she also feels some congestion in her chest that she cannot cough up. Lung cancer patient. A&Ox4.

## 2019-03-28 NOTE — ED Provider Notes (Signed)
[facility] DEPT Provider Note  CSN: [id number] Arrival date & time: 03/28/19 4818  Chief Complaint(s) Chest Pain  HPI Pamela Russell is a 73 y.o. female with extensive past medical history listed below including COPD not on supplemental oxygen, metastatic lung cancer currently on chemotherapy who presents to the emergency department with several hours of gradually worsening shortness of breath and chest tightness similar to prior COPD exacerbations.  Patient attempted to take albuterol puffs at home with minimal relief.  Denies any overt chest pain.  No edema.  No fevers or chills.  Endorses mild cough.  No known sick contacts.  Denies any other physical complaint.  HPI  Past Medical History Past Medical History:  Diagnosis Date  . Allergic rhinitis   . Asthma 2008  . Chronic obstructive pulmonary disease (COPD) ([place]) 06/08/2016   Meds: Spiriva, albuterol inhaler PFT (11/29/2016): Moderate-severe obstruction by PFT 11/2016  . Ear itching 07/05/2017  . Grieving 06/08/2016  . History of pneumonia    x4  . Hyperlipidemia 09/10/2016   On atorvastatin 40 mg QD, ASA 81 mg QD ASCVD risk 17.1%, mod-high intensity statin Risk factors: FHx sig for MI: mother 49s-50s, and father age 83, HTN, smoker  . Hypertension   . Night sweats 06/08/2016  . NSCL Ca dx'd 05/2018   xrt comp 09/2018  . Pulmonary nodules 06/12/2017  . Tobacco use disorder 07/06/2016   On nicotine replacement patch. Smoking cigs since age 17. Has 60 pack year history. Has COPD dx on SABA and LAMA.   Patient Active Problem List   Diagnosis Date Noted  . Abnormal TSH 03/08/2019  . Port-A-Cath in place 02/26/2019  . Encounter for antineoplastic chemotherapy 02/21/2019  . Encounter for antineoplastic immunotherapy 02/21/2019  . Goals of care, counseling/discussion 02/21/2019  . 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 Leukocytosis 02/15/2019  . Brain mass 02/13/2019  .  Hyponatremia 02/12/2019  . Hypokalemia 02/12/2019  . Ptosis 02/12/2019  . Allergic rhinitis 02/11/2019  . Diplopia 02/11/2019  . Electrolyte disturbance 01/26/2019  . Post-radiation pneumonitis ([place]) 01/21/2019  . Hypotension 01/21/2019  . Weakness 01/21/2019  . COPD with acute exacerbation ([place]) 10/21/2018  . 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 Malignant neoplasm of right upper lobe of lung ([place]) 07/14/2018  . Ear itching 07/05/2017  . Pulmonary nodules 06/12/2017  . Hyperlipidemia 09/10/2016  . Tobacco use disorder 07/06/2016  . Primary hypertension 06/08/2016  . Chronic obstructive pulmonary disease (COPD) ([place]) 06/08/2016  . Night sweats 06/08/2016   Home Medication(s) Prior to Admission medications   Medication Sig Start Date End Date Taking? Authorizing Provider  albuterol (PROVENTIL) (2.5 MG/3ML) 0.083% nebulizer solution Take 2.5 mg by nebulization daily as needed for wheezing or shortness of breath.   Yes [provider]  albuterol (VENTOLIN HFA) 108 (90 Base) MCG/ACT inhaler USE 2 INHALATIONS BY MOUTH  EVERY 6 HOURS AS NEEDED FOR WHEEZING OR SHORTNESS OF  BREATH Patient taking differently: Inhale 2 puffs into the lungs every 6 (six) hours as needed for wheezing or shortness of breath.  03/17/19  Yes Mannam, Praveen, MD  aspirin EC 81 MG tablet Take 81 mg by mouth daily.   Yes [provider]  cetirizine (ZYRTEC) 10 MG tablet Take 10 mg by mouth daily.   Yes [provider]  fluticasone (FLONASE) 50 MCG/ACT nasal spray Place 2 sprays into both nostrils daily. Patient taking differently: Place 2 sprays into both  nostrils 2 (two) times a day.  02/11/19  Yes Patriciaann Clan, DO  glucosamine-chondroitin 500-400 MG tablet Take 1 tablet by mouth daily.    Yes [provider]  Glycopyrrolate-Formoterol (BEVESPI AEROSPHERE) 9-4.8 MCG/ACT AERO Inhale 2 puffs into the lungs 2 (two) times daily. 12/17/18  Yes  Fenton Foy, NP  ibuprofen (ADVIL) 200 MG tablet Take 400 mg by mouth every 6 (six) hours as needed for moderate pain.   Yes [provider]  lidocaine-prilocaine (EMLA) cream Apply 1 application topically daily as needed (port).  02/21/19  Yes [provider]  mometasone (ELOCON) 0.1 % lotion Apply 1 application topically daily.   Yes [provider]  Multiple Vitamin (MULTIVITAMIN WITH MINERALS) TABS tablet Take 1 tablet by mouth daily.   Yes [provider]  prochlorperazine (COMPAZINE) 10 MG tablet TAKE 1 TABLET BY MOUTH EVERY 6 HOURS AS NEEDED FOR NAUSEA AND VOMITING Patient taking differently: Take 10 mg by mouth every 6 (six) hours as needed for nausea, vomiting or refractory nausea / vomiting.  03/26/19  Yes Curt Bears, MD  prochlorperazine (COMPAZINE) 10 MG tablet Take 1 tablet (10 mg total) by mouth every 6 (six) hours as needed for nausea or vomiting. 02/21/19   Curt Bears, MD                                                                                                                                    Past Surgical History Past Surgical History:  Procedure Laterality Date  . CATARACT EXTRACTION Left 05/08/2016  . CATARACT EXTRACTION Right 05/22/2016  . IR IMAGING GUIDED PORT INSERTION  02/25/2019  . RADIOLOGY WITH ANESTHESIA N/A 02/13/2019   Procedure: BRAIN MRI;  Surgeon: Radiologist, Medication, MD;  Location: [facility];  Service: Radiology;  Laterality: N/A;  . TOTAL ABDOMINAL HYSTERECTOMY W/ BILATERAL SALPINGOOPHORECTOMY Bilateral 1975  . VIDEO BRONCHOSCOPY WITH ENDOBRONCHIAL NAVIGATION Left 07/09/2018   Procedure: VIDEO BRONCHOSCOPY WITH ENDOBRONCHIAL NAVIGATION;  Surgeon: Collene Gobble, MD;  Location: [facility] OR;  Service: Thoracic;  Laterality: Left;   Family History Family History  Problem Relation Age of Onset  . Heart disease Mother        MI in her 27's  . Stroke Mother   . Heart disease Father        CABG  . Hypertension  Father   . Heart disease Sister 51       CABG  . Stroke Sister   . Diabetes Sister     Social History Social History   Tobacco Use  . Smoking status: Former Smoker    Packs/day: 1.00    Years: 60.00    Pack years: 60.00    Types: Cigarettes    Start date: 08/21/1955    Quit date: 01/28/2019    Years since quitting: 0.1  . Smokeless tobacco: Never Used  . Tobacco comment: 3 ppd for 20+ years, has cut down  to 0.5 ppd, in the past 3 months, no cigs in 1 month  Substance Use Topics  . Alcohol use: Yes    Alcohol/week: 1.0 standard drinks    Types: 1 Cans of beer per week    Comment: occ  . Drug use: No   Allergies Codeine, Penicillins, and Anoro ellipta [umeclidinium-vilanterol]  Review of Systems Review of Systems All other systems are reviewed and are negative for acute change except as noted in the HPI  Physical Exam Vital Signs  I have reviewed the triage vital signs BP 134/60   Pulse 89   Temp (!) 97.5 F (36.4 C) (Oral)   Resp (!) 22   SpO2 99%   Physical Exam Vitals signs reviewed.  Constitutional:      General: She is not in acute distress.    Appearance: She is well-developed. She is not diaphoretic.  HENT:     Head: Normocephalic and atraumatic.     Nose: Nose normal.  Eyes:     General: No scleral icterus.       Right eye: No discharge.        Left eye: No discharge.     Conjunctiva/sclera: Conjunctivae normal.     Pupils: Pupils are equal, round, and reactive to light.  Neck:     Musculoskeletal: Normal range of motion and neck supple.  Cardiovascular:     Rate and Rhythm: Normal rate and regular rhythm.     Heart sounds: No murmur. No friction rub. No gallop.   Pulmonary:     Effort: Pulmonary effort is normal. No respiratory distress.     Breath sounds: Normal breath sounds. Decreased air movement present. No stridor. No rales.  Abdominal:     General: There is no distension.     Palpations: Abdomen is soft.     Tenderness: There is no  abdominal tenderness.  Musculoskeletal:        General: No tenderness.  Skin:    General: Skin is warm and dry.     Findings: No erythema or rash.  Neurological:     Mental Status: She is alert and oriented to person, place, and time.     ED Results and Treatments Labs (all labs ordered are listed, but only abnormal results are displayed) Labs Reviewed  BASIC METABOLIC PANEL - Abnormal; Notable for the following components:      Result Value   Creatinine, Ser 0.42 (*)    All other components within normal limits  CBC - Abnormal; Notable for the following components:   WBC 45.3 (*)    RDW 16.5 (*)    All other components within normal limits                                                                                                                         EKG  EKG Interpretation  Date/Time:    Ventricular Rate:    PR Interval:    QRS Duration:   QT  Interval:    QTC Calculation:   R Axis:     Text Interpretation:        Radiology Dg Chest 2 View  Result Date: 03/28/2019 CLINICAL DATA:  Chest pain EXAM: CHEST - 2 VIEW COMPARISON:  01/21/2019 chest radiograph. FINDINGS: Right internal jugular Port-A-Cath terminates at the cavoatrial junction. Stable cardiomediastinal silhouette with normal heart size. No pneumothorax. No pleural effusion. Hyperinflated lungs. No pulmonary edema. No acute consolidative airspace disease. IMPRESSION: No acute cardiopulmonary disease.  Hyperinflated lungs suggest COPD. Electronically Signed   By: Ilona Sorrel M.D.   On: 03/28/2019 05:26    Pertinent labs & imaging results that were available during my care of the patient were reviewed by me and considered in my medical decision making (see chart for details).  Medications Ordered in ED Medications  sodium chloride flush (NS) 0.9 % injection 3 mL (0 mLs Intravenous Hold 03/28/19 0441)  albuterol (VENTOLIN HFA) 108 (90 Base) MCG/ACT inhaler 6 puff (6 puffs Inhalation Given 03/28/19 0444)   ipratropium (ATROVENT HFA) inhaler 2 puff (2 puffs Inhalation Given 03/28/19 0445)                                                                                                                                    Procedures Procedures  (including critical care time)  Medical Decision Making / ED Course I have reviewed the nursing notes for this encounter and the patient's prior records (if available in EHR or on provided paperwork).   Pamela Russell was evaluated in Emergency Department on 03/28/2019 for the symptoms described in the history of present illness. She was evaluated in the context of the global COVID-19 pandemic, which necessitated consideration that the patient might be at risk for infection with the SARS-CoV-2 virus that causes COVID-19. Institutional protocols and algorithms that pertain to the evaluation of patients at risk for COVID-19 are in a state of rapid change based on information released by regulatory bodies including the CDC and federal and state organizations. These policies and algorithms were followed during the patient's care in the ED.  Patient presents with gradual onset shortness of breath consistent with COPD exacerbation.  Diminished air sounds throughout.  Patient satting well on room air but placed on supplemental oxygen for comfort which improved her shortness of breath.  Patient was also given additional albuterol and Atrovent puffs which has significant improvement in her shortness of breath.  She was able to be taken off oxygen and ambulated without complication.  Labs notable for leukocytosis -consistent with recent FDG injection.  Rest of the labs are grossly reassuring without anemia, electrolyte derangements.  Chest x-ray without evidence of pneumonia, pneumothorax or pulmonary edema.  The patient appears reasonably screened and/or stabilized for discharge and I doubt any other medical condition or other [facility] requiring further screening, evaluation, or  treatment in the ED at this time prior to discharge.  The patient is  safe for discharge with strict return precautions.       Final Clinical Impression(s) / ED Diagnoses Final diagnoses:  COPD exacerbation ([place])    The patient appears reasonably screened and/or stabilized for discharge and I doubt any other medical condition or other [facility] requiring further screening, evaluation, or treatment in the ED at this time prior to discharge.  Disposition: Discharge  Condition: Good  I have discussed the results, Dx and Tx plan with the patient  who expressed understanding and agree(s) with the plan. Discharge instructions discussed at great length. The patient  was given strict return precautions who verbalized understanding of the instructions. No further questions at time of discharge.    ED Discharge Orders    None       Follow Up: Primary care provider  Schedule an appointment as soon as possible for a visit        This chart was dictated using voice recognition software.  Despite best efforts to proofread,  errors can occur which can change the documentation meaning.   Fatima Blank, MD 03/28/19 [phone]

## 2019-03-28 NOTE — ED Notes (Signed)
Pt ambulated in hallway. Standby assistance from RN and daughter. Pt denies SHOB, CP or Chest Tightness. EDP made aware.

## 2019-03-30 ENCOUNTER — Inpatient Hospital Stay: Payer: Medicare Other

## 2019-03-30 MED ORDER — FLUTICASONE PROPIONATE 50 MCG/ACT NA SUSP: 2.0000 | Freq: Every day | NASAL | 1 refills | Status: DC

## 2019-03-31 ENCOUNTER — Telehealth: Payer: Self-pay | Admitting: Internal Medicine

## 2019-03-31 ENCOUNTER — Inpatient Hospital Stay (HOSPITAL_BASED_OUTPATIENT_CLINIC_OR_DEPARTMENT_OTHER): Payer: Medicare Other | Admitting: Internal Medicine

## 2019-03-31 ENCOUNTER — Inpatient Hospital Stay: Payer: Medicare Other

## 2019-03-31 ENCOUNTER — Other Ambulatory Visit: Payer: Self-pay

## 2019-03-31 VITALS — BP 125/69 | HR 91 | Temp 98.2°F | Resp 18 | Ht 64.02 in | Wt 86.7 lb

## 2019-03-31 DIAGNOSIS — D352 Benign neoplasm of pituitary gland: Secondary | ICD-10-CM

## 2019-03-31 DIAGNOSIS — C7931 Secondary malignant neoplasm of brain: Secondary | ICD-10-CM | POA: Diagnosis not present

## 2019-03-31 DIAGNOSIS — H532 Diplopia: Secondary | ICD-10-CM | POA: Diagnosis not present

## 2019-03-31 DIAGNOSIS — C3431 Malignant neoplasm of lower lobe, right bronchus or lung: Secondary | ICD-10-CM

## 2019-03-31 DIAGNOSIS — Z5112 Encounter for antineoplastic immunotherapy: Secondary | ICD-10-CM | POA: Diagnosis not present

## 2019-03-31 DIAGNOSIS — Q048 Other specified congenital malformations of brain: Secondary | ICD-10-CM

## 2019-03-31 DIAGNOSIS — D497 Neoplasm of unspecified behavior of endocrine glands and other parts of nervous system: Secondary | ICD-10-CM

## 2019-03-31 LAB — CBC WITH DIFFERENTIAL (CANCER CENTER ONLY)
Abs Immature Granulocytes: 0.05 10*3/uL (ref 0.00–0.07)
Basophils Absolute: 0.1 10*3/uL (ref 0.0–0.1)
Basophils Relative: 1 %
Eosinophils Absolute: 0 10*3/uL (ref 0.0–0.5)
Eosinophils Relative: 0 %
HCT: 34.8 % — ABNORMAL LOW (ref 36.0–46.0)
Hemoglobin: 11.6 g/dL — ABNORMAL LOW (ref 12.0–15.0)
Immature Granulocytes: 1 %
Lymphocytes Relative: 16 %
Lymphs Abs: 0.9 10*3/uL (ref 0.7–4.0)
MCH: 31.4 pg (ref 26.0–34.0)
MCHC: 33.3 g/dL (ref 30.0–36.0)
MCV: 94.1 fL (ref 80.0–100.0)
Monocytes Absolute: 0.1 10*3/uL (ref 0.1–1.0)
Monocytes Relative: 3 %
Neutro Abs: 4.5 10*3/uL (ref 1.7–7.7)
Neutrophils Relative %: 79 %
Platelet Count: 162 10*3/uL (ref 150–400)
RBC: 3.7 MIL/uL — ABNORMAL LOW (ref 3.87–5.11)
RDW: 15.8 % — ABNORMAL HIGH (ref 11.5–15.5)
WBC Count: 5.7 10*3/uL (ref 4.0–10.5)
nRBC: 0 % (ref 0.0–0.2)

## 2019-03-31 LAB — CMP (CANCER CENTER ONLY)
ALT: 15 U/L (ref 0–44)
AST: 13 U/L — ABNORMAL LOW (ref 15–41)
Albumin: 3.6 g/dL (ref 3.5–5.0)
Alkaline Phosphatase: 106 U/L (ref 38–126)
Anion gap: 8 (ref 5–15)
BUN: 13 mg/dL (ref 8–23)
CO2: 25 mmol/L (ref 22–32)
Calcium: 8.7 mg/dL — ABNORMAL LOW (ref 8.9–10.3)
Chloride: 101 mmol/L (ref 98–111)
Creatinine: 0.43 mg/dL — ABNORMAL LOW (ref 0.44–1.00)
GFR, Est AFR Am: >60 mL/min (ref >60)
GFR, Estimated: >60 mL/min (ref >60)
Glucose, Bld: 97 mg/dL (ref 70–99)
Potassium: 4.1 mmol/L (ref 3.5–5.1)
Sodium: 134 mmol/L — ABNORMAL LOW (ref 135–145)
Total Bilirubin: 0.4 mg/dL (ref 0.3–1.2)
Total Protein: 6.3 g/dL — ABNORMAL LOW (ref 6.5–8.1)

## 2019-03-31 NOTE — Progress Notes (Signed)
[facility] [place], [place] [address] [phone]   New Patient Evaluation  Date of Service: 03/31/19 Patient Name: Pamela Russell Patient MRN: [id number] Patient DOB: Jul 17, 1946 Provider: Ventura Sellers, MD  Identifying Statement:  CHALISE PE is a 73 y.o. female with pituitary neoplasm, brain metastasis who presents for initial consultation and evaluation regarding cancer associated neurologic deficits.    Referring Provider: Caroline More, DO [address],   [id number]  Primary Cancer:  Oncologic History: Oncology History  Small cell lung cancer, right lower lobe ([facility])  02/19/2019 Initial Diagnosis   Small cell lung cancer, right lower lobe ([facility])   03/02/2019 -  Chemotherapy   The patient had palonosetron (ALOXI) injection 0.25 mg, 0.25 mg, Intravenous,  Once, 2 of 4 cycles Administration: 0.25 mg (03/02/2019), 0.25 mg (03/23/2019) pegfilgrastim-jmdb (FULPHILA) injection 6 mg, 6 mg, Subcutaneous,  Once, 1 of 1 cycle pegfilgrastim-cbqv (UDENYCA) injection 6 mg, 6 mg, Subcutaneous, Once, 2 of 4 cycles Administration: 6 mg (03/06/2019) CARBOplatin (PARAPLATIN) 310 mg in sodium chloride 0.9 % 250 mL chemo infusion, 310 mg (100 % of original dose 306.5 mg), Intravenous,  Once, 2 of 4 cycles Dose modification: 306.5 mg (original dose 306.5 mg, Cycle 1), 306.5 mg (original dose 306.5 mg, Cycle 2) Administration: 310 mg (03/02/2019), 310 mg (03/23/2019) etoposide (VEPESID) 140 mg in sodium chloride 0.9 % 500 mL chemo infusion, 100 mg/m2 = 140 mg, Intravenous,  Once, 2 of 4 cycles Administration: 140 mg (03/02/2019), 140 mg (03/03/2019), 140 mg (03/04/2019), 140 mg (03/23/2019), 140 mg (03/24/2019), 140 mg (03/25/2019) fosaprepitant (EMEND) 150 mg, dexamethasone (DECADRON) 12 mg in sodium chloride 0.9 % 145 mL IVPB, , Intravenous,  Once, 2 of 4 cycles Administration:  (03/02/2019),  (03/23/2019) durvalumab (IMFINZI)  1,500 mg in sodium chloride 0.9 % 100 mL chemo infusion, 1,500 mg, Intravenous,  Once, 2 of 8 cycles Administration: 1,500 mg (03/02/2019), 1,500 mg (03/23/2019)  for chemotherapy treatment.      History of Present Illness: The patient's records from the referring physician were obtained and reviewed and the patient interviewed to confirm this HPI.  Pamela Russell presents today to discuss visual impairment in context of abnormalities on recent brain imaging.  She describes gradual onset of double vision, described as "objects side by side or diagonal".  This probably began in the spring prior to diagnosis of small cell lung cancer and subsequent treatments, and has progressed somewhat.  Double vision is clearly worsened by looking "close in".  She also has noticed some drooping of the left eyelid, as well as twitching of the eyelid.  She is not sure if these symptoms have progressed as well.  She has met with radiation oncology for plans to treat suspected brain met from lung cancer.  Overall her chemotherapy has not been well tolerated, causing weight loss, low energy, poor appetite and hair loss.  Was intially on decadron for brain met though this has been weaned off. Describes balance as "poor" she sometimes needs a walker to ambulate.    Medications: Current Outpatient Medications on File Prior to Visit  Medication Sig Dispense Refill   albuterol (PROVENTIL) (2.5 MG/3ML) 0.083% nebulizer solution Take 2.5 mg by nebulization daily as needed for wheezing or shortness of breath.     albuterol (VENTOLIN HFA) 108 (90 Base) MCG/ACT inhaler USE 2 INHALATIONS BY MOUTH  EVERY 6 HOURS AS NEEDED FOR WHEEZING OR SHORTNESS OF  BREATH (Patient  taking differently: Inhale 2 puffs into the lungs every 6 (six) hours as needed for wheezing or shortness of breath. ) 17 g 2   aspirin EC 81 MG tablet Take 81 mg by mouth daily.     cetirizine (ZYRTEC) 10 MG tablet Take 10 mg by mouth daily.     fluticasone  (FLONASE) 50 MCG/ACT nasal spray Place 2 sprays into both nostrils daily. 16 g 1   glucosamine-chondroitin 500-400 MG tablet Take 1 tablet by mouth daily.      Glycopyrrolate-Formoterol (BEVESPI AEROSPHERE) 9-4.8 MCG/ACT AERO Inhale 2 puffs into the lungs 2 (two) times daily. 3 Inhaler 2   ibuprofen (ADVIL) 200 MG tablet Take 400 mg by mouth every 6 (six) hours as needed for moderate pain.     lidocaine-prilocaine (EMLA) cream Apply 1 application topically daily as needed (port).      mometasone (ELOCON) 0.1 % lotion Apply 1 application topically daily.     Multiple Vitamin (MULTIVITAMIN WITH MINERALS) TABS tablet Take 1 tablet by mouth daily.     prochlorperazine (COMPAZINE) 10 MG tablet TAKE 1 TABLET BY MOUTH EVERY 6 HOURS AS NEEDED FOR NAUSEA AND VOMITING (Patient taking differently: Take 10 mg by mouth every 6 (six) hours as needed for nausea, vomiting or refractory nausea / vomiting. ) 30 tablet 0   [DISCONTINUED] prochlorperazine (COMPAZINE) 10 MG tablet Take 1 tablet (10 mg total) by mouth every 6 (six) hours as needed for nausea or vomiting. 30 tablet 0   No current facility-administered medications on file prior to visit.     Allergies:  Allergies  Allergen Reactions   Codeine Anaphylaxis and Swelling   Penicillins Anaphylaxis and Swelling    Childhood reaction. Has patient had a PCN reaction causing immediate rash, facial/tongue/throat swelling, SOB or lightheadedness with hypotension: Yes Has patient had a PCN reaction causing severe rash involving mucus membranes or skin necrosis: No Has patient had a PCN reaction that required hospitalization: No Has patient had a PCN reaction occurring within the last 10 years: No If all of the above answers are "NO", then may proceed with Cephalosporin use.    Anoro Ellipta [Umeclidinium-Vilanterol] Nausea And Vomiting    Severe    Past Medical History:  Past Medical History:  Diagnosis Date   Allergic rhinitis    Asthma  2008   Chronic obstructive pulmonary disease (COPD) ([facility]) 06/08/2016   Meds: Spiriva, albuterol inhaler PFT (11/29/2016): Moderate-severe obstruction by PFT 11/2016   Ear itching 07/05/2017   Grieving 06/08/2016   History of pneumonia    x4   Hyperlipidemia 09/10/2016   On atorvastatin 40 mg QD, ASA 81 mg QD ASCVD risk 17.1%, mod-high intensity statin Risk factors: FHx sig for MI: mother 9s-50s, and father age 6, HTN, smoker   Hypertension    Night sweats 06/08/2016   NSCL Ca dx'd 05/2018   xrt comp 09/2018   Pulmonary nodules 06/12/2017   Tobacco use disorder 07/06/2016   On nicotine replacement patch. Smoking cigs since age 49. Has 60 pack year history. Has COPD dx on SABA and LAMA.   Past Surgical History:  Past Surgical History:  Procedure Laterality Date   CATARACT EXTRACTION Left 05/08/2016   CATARACT EXTRACTION Right 05/22/2016   IR IMAGING GUIDED PORT INSERTION  02/25/2019   RADIOLOGY WITH ANESTHESIA N/A 02/13/2019   Procedure: BRAIN MRI;  Surgeon: Radiologist, Medication, MD;  Location: [facility];  Service: Radiology;  Laterality: N/A;   TOTAL ABDOMINAL HYSTERECTOMY W/ BILATERAL SALPINGOOPHORECTOMY Bilateral 1975  VIDEO BRONCHOSCOPY WITH ENDOBRONCHIAL NAVIGATION Left 07/09/2018   Procedure: VIDEO BRONCHOSCOPY WITH ENDOBRONCHIAL NAVIGATION;  Surgeon: Collene Gobble, MD;  Location: [facility];  Service: Thoracic;  Laterality: Left;   Social History:  Social History   Socioeconomic History   Marital status: Widowed    Spouse name: Not on file   Number of children: 2   Years of education: Not on file   Highest education level: Not on file  Occupational History   Not on file  Social Needs   Financial resource strain: Not on file   Food insecurity    Worry: Not on file    Inability: Not on file   Transportation needs    Medical: Not on file    Non-medical: Not on file  Tobacco Use   Smoking status: Former Smoker    Packs/day: 1.00    Years: 60.00     Pack years: 60.00    Types: Cigarettes    Start date: 08/21/1955    Quit date: 01/28/2019    Years since quitting: 0.1   Smokeless tobacco: Never Used   Tobacco comment: 3 ppd for 20+ years, has cut down to 0.5 ppd, in the past 3 months, no cigs in 1 month  Substance and Sexual Activity   Alcohol use: Yes    Alcohol/week: 1.0 standard drinks    Types: 1 Cans of beer per week    Comment: occ   Drug use: No   Sexual activity: Not on file  Lifestyle   Physical activity    Days per week: Not on file    Minutes per session: Not on file   Stress: Not on file  Relationships   Social connections    Talks on phone: Not on file    Gets together: Not on file    Attends religious service: Not on file    Active member of club or organization: Not on file    Attends meetings of clubs or organizations: Not on file    Relationship status: Not on file   Intimate partner violence    Fear of current or ex partner: Not on file    Emotionally abused: Not on file    Physically abused: Not on file    Forced sexual activity: Not on file  Other Topics Concern   Not on file  Social History Narrative   Not on file   Family History:  Family History  Problem Relation Age of Onset   Heart disease Mother        MI in her 16's   Stroke Mother    Heart disease Father        CABG   Hypertension Father    Heart disease Sister 41       CABG   Stroke Sister    Diabetes Sister     Review of Systems: Constitutional:+weight loss Eyes: Denies blurriness of vision Ears, nose, mouth, throat, and face: Denies mucositis or sore throat Respiratory: Denies cough, dyspnea or wheezes Cardiovascular: Denies palpitation, chest discomfort or lower extremity swelling Gastrointestinal:  +nausea GU: Denies dysuria or incontinence Skin: Denies abnormal skin rashes Neurological: Per HPI Musculoskeletal: Denies joint pain, back or neck discomfort. No decrease in ROM Behavioral/Psych: +anxiety,  "jittery"  Physical Exam: Vitals:   03/31/19 1046  BP: 125/69  Pulse: 91  Resp: 18  Temp: 98.2 F (36.8 C)  SpO2: 98%   KPS: 70. General: Alert, cooperative, pleasant, but thin/cachectic Head: Normal EENT: No conjunctival injection or  scleral icterus. Oral mucosa moist Lungs: Resp effort normal Cardiac: Regular rate and rhythm Abdomen: Soft, non-distended abdomen Skin: No rashes cyanosis or petechiae. Extremities: No clubbing or edema  Neurologic Exam: Mental Status: Awake, alert, attentive to examiner. Oriented to self and environment. Language is fluent with intact comprehension.  Cranial Nerves: Visual acuity is grossly normal. Visual fields are full. Extra-ocular movements intact, diplopia noted with convergence. Subtle ptosis of left eyelid. Face is symmetric, tongue midline. Motor: Tone and bulk are normal. Power is full in both arms and legs. Reflexes are symmetric, no pathologic reflexes present. Intact finger to nose bilaterally Sensory: Intact to light touch and temperature Gait: Normal and tandem gait is deferred  Labs: I have reviewed the data as listed    Component Value Date/Time   NA 134 (L) 03/31/2019 1027   NA 130 (L) 01/26/2019 1153   K 4.1 03/31/2019 1027   CL 101 03/31/2019 1027   CO2 25 03/31/2019 1027   GLUCOSE 97 03/31/2019 1027   BUN 13 03/31/2019 1027   BUN 5 (L) 01/26/2019 1153   CREATININE 0.43 (L) 03/31/2019 1027   CREATININE 0.69 06/08/2016 1128   CALCIUM 8.7 (L) 03/31/2019 1027   PROT 6.3 (L) 03/31/2019 1027   PROT 7.1 05/27/2017 1458   ALBUMIN 3.6 03/31/2019 1027   ALBUMIN 4.9 (H) 05/27/2017 1458   AST 13 (L) 03/31/2019 1027   ALT 15 03/31/2019 1027   ALKPHOS 106 03/31/2019 1027   BILITOT 0.4 03/31/2019 1027   GFRNONAA >60 03/31/2019 1027   GFRNONAA 89 06/08/2016 1128   GFRAA >60 03/31/2019 1027   GFRAA >89 06/08/2016 1128   Lab Results  Component Value Date   WBC 5.7 03/31/2019   NEUTROABS 4.5 03/31/2019   HGB 11.6 (L)  03/31/2019   HCT 34.8 (L) 03/31/2019   MCV 94.1 03/31/2019   PLT 162 03/31/2019    Imaging:  Ct Angio Head W Or Wo Contrast  Result Date: 03/18/2019 CLINICAL DATA:  Initial evaluation for acute diplopia. EXAM: CT ANGIOGRAPHY HEAD AND NECK TECHNIQUE: Multidetector CT imaging of the head and neck was performed using the standard protocol during bolus administration of intravenous contrast. Multiplanar CT image reconstructions and MIPs were obtained to evaluate the vascular anatomy. Carotid stenosis measurements (when applicable) are obtained utilizing NASCET criteria, using the distal internal carotid diameter as the denominator. CONTRAST:  95m OMNIPAQUE IOHEXOL 350 MG/ML SOLN COMPARISON:  Prior head CT from earlier the same day. FINDINGS: CTA NECK FINDINGS Aortic arch: Partially visualized aortic arch of normal caliber. Origin of the right brachiocephalic and left common carotid arteries not visualized on this exam. Scattered atheromatous plaque within the visualized aortic arch and about the origin of the left subclavian artery without hemodynamically significant stenosis. Right carotid system: Eccentric calcified plaque about the origin of the right CCA with associated narrowing of up to approximately 40% by NASCET criteria. Right CCA mildly tortuous with scattered atheromatous plaque distally without high-grade stenosis. Bulky calcified plaque about the right bifurcation/proximal right ICA. Associated stenosis of up to approximately 50% about the origin of the right ICA distally, right ICA mildly tortuous with scattered atheromatous plaque without additional high-grade stenosis, dissection, or occlusion. Left carotid system: Origin of the left CCA not visible on this exam. Scattered atheromatous plaque throughout the visualized left CCA with mild multifocal narrowing. Bulky mixed plaque about the left bifurcation/proximal left ICA with associated stenosis of up to 50% by NASCET criteria. Additional  atheromatous plaque within the distal left ICA with  associated focal stenosis of up to 65% by NASCET criteria (series 7, image 148). Left ICA otherwise patent to the skull base. Vertebral arteries: Both vertebral arteries arise from the subclavian arteries. Right vertebral artery dominant. Mild scattered atheromatous plaque within the right V1 and V2 segments with associated mild multifocal stenosis. Right vertebral otherwise widely patent to the skull base. Hypoplastic left vertebral artery occluded at its origin, and remains largely occluded within the neck. Distal reconstitution via muscular branches at the left V3 segment (series 7, image 163). Attenuated irregular flow seen within the hypoplastic left vertebral artery as it crosses into the cranial vault. Skeleton: Advanced compression deformity partially seen involving the T5 vertebral body with at least 80% height loss, age indeterminate, but new relative to recent CT from 01/16/2019. Irregular sclerotic lesion within the T1 vertebral body stable as compared to multiple previous exams, and presumably benign. No other discrete lytic or blastic osseous lesions. Moderate multilevel facet arthrosis noted within the cervical spine, left slightly worse than right. Degenerative changes noted about the TMJs bilaterally. Other neck: No other acute soft tissue abnormality within the neck. Prominent 1 cm left no noted adjacent to the left lobe of thyroid (series 5, image 31). Upper chest: Visualized upper chest demonstrates no acute finding. Upper lobe predominant centrilobular emphysema. Right-sided Port-A-Cath noted. Review of the MIP images confirms the above findings CTA HEAD FINDINGS Anterior circulation: Petrous right ICA widely patent. Focal atheromatous plaque within the petrous left ICA with associated short-segment mild stenosis. Extensive atheromatous plaque within the cavernous/supraclinoid ICAs with resultant moderate to severe diffuse stenosis (estimated  50-75% bilaterally, left worse than right). ICA termini well perfused. Right A1 patent. A hypoplastic left A1 appears occluded. Normal anterior communicating artery. Anterior cerebral arteries patent to their distal aspects without stenosis or occlusion. No M1 stenosis or occlusion. Normal MCA bifurcations. Distal MCA branches well perfused and symmetric. Posterior circulation: Focal plaque within the dominant right vertebral artery as it crosses into the cranial vault without significant stenosis. Dominant right V4 segment otherwise patent to the vertebrobasilar junction. Patent right PICA. Hypoplastic left vertebral artery attenuated and irregular. Superimposed short-segment moderate to severe proximal left V4 stenosis prior to the takeoff of the left PICA (series 7, image 151). Additional short-segment moderate stenosis within the distal left V4 segment just prior to the vertebrobasilar junction. Left PICA patent. Basilar irregular but widely patent to its distal aspect. Superior cerebral arteries patent bilaterally. Both of the posterior cerebral arteries primarily supplied via the basilar. PCAs demonstrate scattered multifocal atheromatous irregularity but are well perfused to their distal aspects without high-grade stenosis. Venous sinuses: Patent. Anatomic variants: None significant. Review of the MIP images confirms the above findings IMPRESSION: 1. Negative CTA for emergent large vessel occlusion. 2. Bulky atheromatous plaque about the origins of the internal carotid arteries with associated stenoses of up to 50% by NASCET criteria bilaterally. Additional short-segment 50% stenosis involving the distal left ICA as above. 3. Heavy atherosclerotic change throughout the carotid siphons with associated moderate to severe multifocal stenoses, left worse than right. 4. 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 Superimposed moderate to severe  multifocal left V4 stenoses as above. 5. Hypoplastic and occluded left A1 segment. Anterior cerebral arteries supplied via the right carotid artery system. 6. Severe compression deformity involving the T5 vertebral body, age indeterminate, but new relative to most recent chest CT from 01/16/2019. Correlation with history and physical exam recommended. 7. Ill-defined sclerotic  lesion within the T1 vertebral body, indeterminate, but stable relative to multiple previous exams. 8. Emphysema. Critical Value/emergent results were called by telephone at the time of interpretation on 03/18/2019 at 8:09 pm to Dr. Gareth Morgan , who verbally acknowledged these results. Electronically Signed   By: Jeannine Boga M.D.   On: 03/18/2019 20:09   Dg Chest 2 View  Result Date: 03/28/2019 CLINICAL DATA:  Chest pain EXAM: CHEST - 2 VIEW COMPARISON:  01/21/2019 chest radiograph. FINDINGS: Right internal jugular Port-A-Cath terminates at the cavoatrial junction. Stable cardiomediastinal silhouette with normal heart size. No pneumothorax. No pleural effusion. Hyperinflated lungs. No pulmonary edema. No acute consolidative airspace disease. IMPRESSION: No acute cardiopulmonary disease.  Hyperinflated lungs suggest COPD. Electronically Signed   By: Ilona Sorrel M.D.   On: 03/28/2019 05:26   Ct Angio Neck W And/or Wo Contrast  Result Date: 03/18/2019 CLINICAL DATA:  Initial evaluation for acute diplopia. EXAM: CT ANGIOGRAPHY HEAD AND NECK TECHNIQUE: Multidetector CT imaging of the head and neck was performed using the standard protocol during bolus administration of intravenous contrast. Multiplanar CT image reconstructions and MIPs were obtained to evaluate the vascular anatomy. Carotid stenosis measurements (when applicable) are obtained utilizing NASCET criteria, using the distal internal carotid diameter as the denominator. CONTRAST:  18m OMNIPAQUE IOHEXOL 350 MG/ML SOLN COMPARISON:  Prior head CT from earlier the same  day. FINDINGS: CTA NECK FINDINGS Aortic arch: Partially visualized aortic arch of normal caliber. Origin of the right brachiocephalic and left common carotid arteries not visualized on this exam. Scattered atheromatous plaque within the visualized aortic arch and about the origin of the left subclavian artery without hemodynamically significant stenosis. Right carotid system: Eccentric calcified plaque about the origin of the right CCA with associated narrowing of up to approximately 40% by NASCET criteria. Right CCA mildly tortuous with scattered atheromatous plaque distally without high-grade stenosis. Bulky calcified plaque about the right bifurcation/proximal right ICA. Associated stenosis of up to approximately 50% about the origin of the right ICA distally, right ICA mildly tortuous with scattered atheromatous plaque without additional high-grade stenosis, dissection, or occlusion. Left carotid system: Origin of the left CCA not visible on this exam. Scattered atheromatous plaque throughout the visualized left CCA with mild multifocal narrowing. Bulky mixed plaque about the left bifurcation/proximal left ICA with associated stenosis of up to 50% by NASCET criteria. Additional atheromatous plaque within the distal left ICA with associated focal stenosis of up to 65% by NASCET criteria (series 7, image 148). Left ICA otherwise patent to the skull base. Vertebral arteries: Both vertebral arteries arise from the subclavian arteries. Right vertebral artery dominant. Mild scattered atheromatous plaque within the right V1 and V2 segments with associated mild multifocal stenosis. Right vertebral otherwise widely patent to the skull base. Hypoplastic left vertebral artery occluded at its origin, and remains largely occluded within the neck. Distal reconstitution via muscular branches at the left V3 segment (series 7, image 163). Attenuated irregular flow seen within the hypoplastic left vertebral artery as it crosses  into the cranial vault. Skeleton: Advanced compression deformity partially seen involving the T5 vertebral body with at least 80% height loss, age indeterminate, but new relative to recent CT from 01/16/2019. Irregular sclerotic lesion within the T1 vertebral body stable as compared to multiple previous exams, and presumably benign. No other discrete lytic or blastic osseous lesions. Moderate multilevel facet arthrosis noted within the cervical spine, left slightly worse than right. Degenerative changes noted about the TMJs bilaterally. Other neck: No other  acute soft tissue abnormality within the neck. Prominent 1 cm left no noted adjacent to the left lobe of thyroid (series 5, image 31). Upper chest: Visualized upper chest demonstrates no acute finding. Upper lobe predominant centrilobular emphysema. Right-sided Port-A-Cath noted. Review of the MIP images confirms the above findings CTA HEAD FINDINGS Anterior circulation: Petrous right ICA widely patent. Focal atheromatous plaque within the petrous left ICA with associated short-segment mild stenosis. Extensive atheromatous plaque within the cavernous/supraclinoid ICAs with resultant moderate to severe diffuse stenosis (estimated 50-75% bilaterally, left worse than right). ICA termini well perfused. Right A1 patent. A hypoplastic left A1 appears occluded. Normal anterior communicating artery. Anterior cerebral arteries patent to their distal aspects without stenosis or occlusion. No M1 stenosis or occlusion. Normal MCA bifurcations. Distal MCA branches well perfused and symmetric. Posterior circulation: Focal plaque within the dominant right vertebral artery as it crosses into the cranial vault without significant stenosis. Dominant right V4 segment otherwise patent to the vertebrobasilar junction. Patent right PICA. Hypoplastic left vertebral artery attenuated and irregular. Superimposed short-segment moderate to severe proximal left V4 stenosis prior to the  takeoff of the left PICA (series 7, image 151). Additional short-segment moderate stenosis within the distal left V4 segment just prior to the vertebrobasilar junction. Left PICA patent. Basilar irregular but widely patent to its distal aspect. Superior cerebral arteries patent bilaterally. Both of the posterior cerebral arteries primarily supplied via the basilar. PCAs demonstrate scattered multifocal atheromatous irregularity but are well perfused to their distal aspects without high-grade stenosis. Venous sinuses: Patent. Anatomic variants: None significant. Review of the MIP images confirms the above findings IMPRESSION: 1. Negative CTA for emergent large vessel occlusion. 2. Bulky atheromatous plaque about the origins of the internal carotid arteries with associated stenoses of up to 50% by NASCET criteria bilaterally. Additional short-segment 50% stenosis involving the distal left ICA as above. 3. Heavy atherosclerotic change throughout the carotid siphons with associated moderate to severe multifocal stenoses, left worse than right. 4. 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 Superimposed moderate to severe multifocal left V4 stenoses as above. 5. Hypoplastic and occluded left A1 segment. Anterior cerebral arteries supplied via the right carotid artery system. 6. Severe compression deformity involving the T5 vertebral body, age indeterminate, but new relative to most recent chest CT from 01/16/2019. Correlation with history and physical exam recommended. 7. Ill-defined sclerotic lesion within the T1 vertebral body, indeterminate, but stable relative to multiple previous exams. 8. Emphysema. Critical Value/emergent results were called by telephone at the time of interpretation on 03/18/2019 at 8:09 pm to Dr. Gareth Morgan , who verbally acknowledged these results. Electronically Signed   By: Jeannine Boga M.D.   On: 03/18/2019 20:09     Mr Jeri Cos IR Contrast  Result Date: 03/19/2019 CLINICAL DATA:  Initial evaluation for acute visual changes, diplopia. EXAM: MRI HEAD WITHOUT AND WITH CONTRAST TECHNIQUE: Multiplanar, multiecho pulse sequences of the brain and surrounding structures were obtained without and with intravenous contrast. CONTRAST:  5 cc of Gadavist. COMPARISON:  Prior CT and CTA from earlier same day as well as previous brain MRI from 02/13/2019. FINDINGS: Brain: Examination severely degraded by motion artifact, limiting evaluation. 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 Underlying age-related cerebral atrophy with mild chronic microvascular ischemic disease. 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 No abnormal foci of restricted diffusion to suggest acute or subacute ischemia on today's exam. Previously seen  focus of diffusion abnormality with associated ring enhancement within the left occipital lobe is not seen on today's exam, and appears to have resolved, suggesting that this finding represented a subacute infarct on prior study. No residual FLAIR signal abnormality now seen within this region. No other mass lesion or definite abnormal enhancement on today's exam. No midline shift or mass effect. No evidence for acute intracranial hemorrhage. No hydrocephalus. No extra-axial fluid collection. Diffuse enlargement with heterogeneous enhancement of the pituitary gland again noted, grossly similar, but better evaluated on recent brain MRI given technical limitations due to motion on this exam. Vascular: Major intracranial vascular flow voids are maintained. Skull and upper cervical spine: Craniocervical junction grossly within normal limits. No definite focal marrow replacing lesion on this motion degraded exam. Scalp soft tissues demonstrate no acute finding. Sinuses/Orbits: Patient status post bilateral ocular lens replacement. Globes and orbital soft  tissues demonstrate no acute finding. Paranasal sinuses are clear. No appreciable mastoid effusion. Inner ear structures grossly normal. Other: None. IMPRESSION: 1. Technically limited exam due to extensive motion artifact. 2. No acute intracranial infarct or other abnormality identified. 3. Apparent interval resolution of previously seen 6 mm ring-enhancing lesion within the left occipital lobe, which most likely reflected a subacute infarct on previous exam. No evidence for parenchymal mass or metastatic disease on today's study. Given the technical limitations and extensive motion artifact on this exam, a short interval follow-up study in 4-6 weeks to confirm these findings is suggested. 4. Grossly similar appearance of enlarged pituitary gland, better seen on previous MRI. 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 Attention at follow-up recommended. Electronically Signed   By: Jeannine Boga M.D.   On: 03/19/2019 00:32   Nm Pet Image Restag (ps) Skull Base To Thigh  Result Date: 03/26/2019 CLINICAL DATA:  The LEFT supraclavicular node may be amenable to ultrasound-guided percutaneous biopsy. Treatment strategy for small cell lung cancer. EXAM: NUCLEAR MEDICINE PET SKULL BASE TO THIGH TECHNIQUE: 4.9 mCi F-18 FDG was injected intravenously. Full-ring PET imaging was performed from the skull base to thigh after the radiotracer. CT data was obtained and used for attenuation correction and anatomic localization. Fasting blood glucose: 93 mg/dl COMPARISON:  PET-CT 06/24/2018 FINDINGS: Mediastinal blood pool activity: SUV max 1.6 Liver activity: SUV max NA NECK: Hypermetabolic lymph node in the LEFT supraclavicular nodal station adjacent to the lower pole of the LEFT lobe of thyroid gland measures 1.2 cm with SUV max equal 4.7. (Image 47/4) Incidental CT findings: none CHEST: Resolution of the hypermetabolic nodule in the LEFT upper lobe. New hypermetabolic middle mediastinal lymph nodes  along the descending thoracic aorta . Lymph node LEFT of the descending aorta measures 7 mm with SUV max equal 7.9. Lymph node at the same level (image 97/4) on the RIGHT measures 1.3 cm SUV max equal 4.9. More inferior node on the RIGHT measures 12 mm (image 105 with) SUV max equal 3.0. Ill-defined nodule in the RIGHT upper lobe measuring 6 mm unchanged in size from prior. There is mild bronchial thickening adjacent to this nodule. There is mild metabolic activity SUV max equal 1.8 similar prior. Metabolic activity associated with a RIGHT upper lobe cystic lesion is decreased. Incidental CT findings: none ABDOMEN/PELVIS: New hypermetabolic lymph nodes along the iliac chains. Example cluster of nodes along the LEFT common iliac measuring 8 mm with SUV max equal 5.1 (image 148 fused data set). Similar nodes adjacent to the RIGHT common iliac with a 12 mm node (image 137/4)  with SUV max equal 5.0. No hypermetabolic inguinal nodes. Similar lymph nodes LEFT aorta at the level of the kidneys SUV max equal 5.0. Hypermetabolic lymph nodes in the porta hepatis Incidental CT findings: none SKELETON: No focal hypermetabolic activity to suggest skeletal metastasis. Incidental CT findings: none IMPRESSION: 1. New hypermetabolic lymphadenopathy in the chest, abdomen, and pelvis concerning for lymphoma. 2. Hypermetabolic lymph nodes involve the posterior mediastinum periaortic nodal stations, upper abdominal periaortic nodal stations, periportal nodes and common iliac nodal stations. Additional solitary LEFT supraclavicular node. 3. LEFT supraclavicular node may be amenable to ultrasound-guided percutaneous biopsy. 4. Resolution of hypermetabolic pulmonary nodule in the LEFT upper lobe. 5. Mild metabolic activity associated with a RIGHT upper lobe pulmonary nodule. Recommend attention on follow-up Electronically Signed   By: Suzy Bouchard M.D.   On: 03/26/2019 15:17   Ct Head Code Stroke Wo Contrast  Result Date:  03/18/2019 CLINICAL DATA:  Code stroke. Initial evaluation for acute visual changes, visual loss. EXAM: CT HEAD WITHOUT CONTRAST TECHNIQUE: Contiguous axial images were obtained from the base of the skull through the vertex without intravenous contrast. COMPARISON:  None. FINDINGS: Brain: Age-related cerebral atrophy with chronic microvascular ischemic disease. 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 No acute intracranial hemorrhage. No acute large vessel territory infarct. No visible mass lesion, mass effect, or midline shift. No hydrocephalus. No extra-axial fluid collection. Vascular: No hyperdense vessel. Scattered vascular calcifications noted within the carotid siphons. Skull: Scalp soft tissues demonstrate no acute finding. Calvarium intact. Sinuses/Orbits: Globes and orbital soft tissues demonstrate no acute finding. Patient status post bilateral ocular lens replacement. Paranasal sinuses are clear. No mastoid effusion. Other: None. ASPECTS [facility] Stroke Program Early CT Score) - Ganglionic level infarction (caudate, lentiform nuclei, internal capsule, insula, M1-M3 cortex): 7 - Supraganglionic infarction (M4-M6 cortex): 3 Total score (0-10 with 10 being normal): 10 IMPRESSION: 1. No acute intracranial infarct or other abnormality identified. 2. ASPECTS is 10. 3. Right cerebral heterotopia, grossly stable, better evaluated on recent brain MRI. 4. 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 No other visible mass lesion. Critical Value/emergent results were called by telephone at the time of interpretation on 03/18/2019 at 7:30 pm to Dr. Gareth Morgan , who verbally acknowledged these results. Electronically Signed   By: Jeannine Boga M.D.   On: 03/18/2019 19:34    [facility] Clinician Interpretation: I have personally  reviewed the CNS specific radiological images as listed.  My interpretation, in the context of the patient's clinical presentation, is resolution of left temporal mestastasis.  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  Pupillary size is normal. Very likely etiology is pituitary mass, especially given its pattern of growth lataral into the cavernous sinus rather than superior into the optic chiasm.    Based on sequence of MRIs, we have reason to suspect this mass represents an adenoma or other process separate from primary lung neoplasm.  Although small cell can masquerade as pituitary adenoma, it is notable that the lesion is stable over 1 month and that the left temporal lesion regressed following treatment with known CNS penetrant agents (carboplatin and etoposide).    Prolactin level is notably absent from pituitary screening performed recently.  We will have prolactin level  added on to upcoming labs scheduled for next week.  We discussed potential treatment with dopamine agonist if this is found to be horomone secreting.  Otherwise, would likely wait until after chemotherapy to pursue local therapy such as SRS or fractionated RT.  Given her clinical status she would not likely be good candidate for surgery.  Right now, her treatments for lung cancer should be prioritized.  Because of changes noted on imaging, planned left temporal SRS will be deferred.   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  These are usually result of cortical migration abnormalities.      We spent twenty additional minutes teaching regarding the natural history,  biology, and historical experience in the treatment of neurologic complications of cancer. We also provided teaching sheets for the patient to take home as an additional resource.  We appreciate the opportunity to participate in the care of Pamela Russell.  She will return to clinic in 2-3 months following next MRI brain, or sooner as needed.  We will contact her directly if prolactin level is elevated.   All questions were answered. The patient knows to call the clinic with any problems, questions or concerns. No barriers to learning were detected.  The total time spent in the encounter was 40 minutes and more than 50% was on counseling and review of test results   Ventura Sellers, MD Medical Director of Neuro-Oncology [facility] [place] 03/31/19 4:48 PM

## 2019-03-31 NOTE — Telephone Encounter (Signed)
No los per 8/11.

## 2019-04-01 ENCOUNTER — Ambulatory Visit: Payer: Medicare Other | Admitting: Primary Care

## 2019-04-01 ENCOUNTER — Encounter: Payer: Self-pay | Admitting: Primary Care

## 2019-04-01 VITALS — HR 62 | Temp 98.0°F | Ht 64.0 in | Wt 87.0 lb

## 2019-04-01 DIAGNOSIS — J449 Chronic obstructive pulmonary disease, unspecified: Secondary | ICD-10-CM

## 2019-04-01 DIAGNOSIS — C3431 Malignant neoplasm of lower lobe, right bronchus or lung: Secondary | ICD-10-CM | POA: Diagnosis not present

## 2019-04-01 DIAGNOSIS — D497 Neoplasm of unspecified behavior of endocrine glands and other parts of nervous system: Secondary | ICD-10-CM | POA: Diagnosis not present

## 2019-04-01 MED ORDER — IPRATROPIUM-ALBUTEROL 0.5-2.5 (3) MG/3ML IN SOLN: 3.0000 mL | Freq: Four times a day (QID) | RESPIRATORY_TRACT | 3 refills | Status: DC | PRN

## 2019-04-01 NOTE — Progress Notes (Signed)
_0  ID: Pamela Russell, female    DOB: October 28, 1945, 73 y.o.   MRN: [id number]  No chief complaint on file.   Referring provider: Caroline More, DO  HPI: 73 year old female, current smoker. PMH significant for tobacco abuse, hypertension, hyperlipidemia, COPD,  malignant neoplasm right upper lobe lung, secondary malignant neoplasm of brain and spinal cord. Patient of Dr. Vaughan Browner. Given sample of Bevespi during last OV in March.   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 Patient met with Dr. Roxan Hockey in December 2019 and was not interested in having surgical resection. Referred to Dr. Lisbeth Renshaw with radiation oncology for SBRT treatment in January. Repeat CT chest 01/16/19 showed unchanged right lung nodules, prior left suprahilar nodules no longer visualized. Suspected early localized radiation changes. Incidental findings extensive lymphadenopathy in the posterior mediastinum and centered in the upper abdomen- worrisome for lymphoma or nodal metastases for an unidentified abdominal malignancy. Patient was updated on these findings per radiation/oncology and referred to GI conference on 6/17.   Previous [facility] pulmonary encounters: 01/21/2019 Patient presents today for acute visit with complaints of shortness of breath x3-4 weeks. States that her breathing has worsened since radiation treatment. Reports that her oxygen drops to 87-89% with activity at home. Continues taking Bevespi as prescribed. She has a nebulizer at home and used it a few days ago. She does not cough that often, when she does it's early in the morning and clear. Occasionally gets dizzy with position changes. Unable to walk >25 feet d/t deconditioning, using wheelchair today. Denies fever or sick contact. Labs return showed critical lab results, NA 117  and K 2.9. Advised ED.  02/04/2019 Patient called today for televisit. She has been to the ED twice for electrolyte imbalances (01/21/19 and 01/27/19). Hyperkalemia at lab draw and family practice office on 6/8 suspect error or hemolyzed specimen. EKG showed no evidence of hyperkalemia, repeat labs K 4.7. Sodium up to 131. Ok to stop potassium and magnesium supplement.  Recommend close follow-up with PCP. Needs repeat BMET and Mag.  Patient is doing well today, states that she is feeling really good. Breathing has improved. Seen on 6/3 for increased shortness of breath after radiation. 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 She is only half pill today and has 5 days left. Still using Bevespi twice daily. Hasn't required her Albuterol rescue inhaler, uses occasionally out of habit.   04/01/2019 Patient presents today for follow-up. Visit shortned d/t nausea related to chemotherapy. States that it is a good day in terms of her breathing. No current wheezing. Seen on 8/8 in ED for COPD exacerbation. She was given supplemental oxygen, albuterol and atrovent with significant improvement in her shortness of breath. CXR without evidence of pneumonia, pneumothorax or pulmonary edema . She is requesting prescription for ipratropium-albuterol nebs and needs new nebulizer machine.  Recently seen by Dr. Julien Nordmann on 7/20 for an office visit. S/p SBRT completed in January 2020. 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 She reported intermittent diplopia during oncology follow-up on 8/3. She was seen in ED on 7/29 where she had a repeat brain MRI which noted resolution previously seen 25m left occipital lobe lesion and grossly similar appearing enlargement of pituitary gland. Dr. VMickeal Skinnerto examine her formally, referral was placed.  Allergies  Allergen Reactions   Codeine Anaphylaxis and  Swelling   Penicillins Anaphylaxis and Swelling    Childhood reaction. Has patient had a PCN reaction causing immediate rash, facial/tongue/throat swelling, SOB or lightheadedness with hypotension: Yes Has patient had a PCN reaction causing severe rash involving mucus membranes or skin necrosis: No Has patient had a PCN reaction that required hospitalization: No Has patient had a PCN reaction occurring within the last 10 years: No If all of the above answers are "NO", then may proceed with Cephalosporin use.    Anoro Ellipta [Umeclidinium-Vilanterol] Nausea And Vomiting    Severe     Immunization History  Administered Date(s) Administered   Influenza,inj,Quad PF,6+ Mos 05/27/2017   Influenza-Unspecified 05/20/2013, 06/08/2016   Pneumococcal Conjugate-13 04/30/2014   Pneumococcal-Unspecified 07/01/2014   Tdap 07/23/2016   Zoster 05/12/2013    Past Medical History:  Diagnosis Date   Allergic rhinitis    Asthma 2008   Chronic obstructive pulmonary disease (COPD) ([place]) 06/08/2016   Meds: Spiriva, albuterol inhaler PFT (11/29/2016): Moderate-severe obstruction by PFT 11/2016   Ear itching 07/05/2017   Grieving 06/08/2016   History of pneumonia    x4   Hyperlipidemia 09/10/2016   On atorvastatin 40 mg QD, ASA 81 mg QD ASCVD risk 17.1%, mod-high intensity statin Risk factors: FHx sig for MI: mother 63s-50s, and father age 22, HTN, smoker   Hypertension    Night sweats 06/08/2016   NSCL Ca dx'd 05/2018   xrt comp 09/2018   Pulmonary nodules 06/12/2017   Tobacco use disorder 07/06/2016   On nicotine replacement patch. Smoking cigs since age 22. Has 60 pack year history. Has COPD dx on SABA and LAMA.    Tobacco History: Social History   Tobacco Use  Smoking Status Former Smoker   Packs/day: 1.00   Years: 60.00   Pack years: 60.00   Types: Cigarettes   Start date: 08/21/1955   Quit date: 01/28/2019   Years since quitting: 0.1  Smokeless Tobacco Never  Used  Tobacco Comment   3 ppd for 20+ years, has cut down to 0.5 ppd, in the past 3 months, no cigs in 1 month   Counseling given: Not Answered Comment: 3 ppd for 20+ years, has cut down to 0.5 ppd, in the past 3 months, no cigs in 1 month   Outpatient Medications Prior to Visit  Medication Sig Dispense Refill   albuterol (VENTOLIN HFA) 108 (90 Base) MCG/ACT inhaler USE 2 INHALATIONS BY MOUTH  EVERY 6 HOURS AS NEEDED FOR WHEEZING OR SHORTNESS OF  BREATH (Patient taking differently: Inhale 2 puffs into the lungs every 6 (six) hours as needed for wheezing or shortness of breath. ) 17 g 2   aspirin EC 81 MG tablet Take 81 mg by mouth daily.     cetirizine (ZYRTEC) 10 MG tablet Take 10 mg by mouth daily.     fluticasone (FLONASE) 50 MCG/ACT nasal spray Place 2 sprays into both nostrils daily. 16 g 1   glucosamine-chondroitin 500-400 MG tablet Take 1 tablet by mouth daily.      Glycopyrrolate-Formoterol (BEVESPI AEROSPHERE) 9-4.8 MCG/ACT AERO Inhale 2 puffs into the lungs 2 (two) times daily. 3 Inhaler 2   ibuprofen (ADVIL) 200 MG tablet Take 400 mg by mouth every 6 (six) hours as needed for moderate pain.     lidocaine-prilocaine (EMLA) cream Apply 1 application topically daily as needed (port).      mometasone (ELOCON) 0.1 % lotion Apply 1 application topically daily.  Multiple Vitamin (MULTIVITAMIN WITH MINERALS) TABS tablet Take 1 tablet by mouth daily.     prochlorperazine (COMPAZINE) 10 MG tablet TAKE 1 TABLET BY MOUTH EVERY 6 HOURS AS NEEDED FOR NAUSEA AND VOMITING (Patient taking differently: Take 10 mg by mouth every 6 (six) hours as needed for nausea, vomiting or refractory nausea / vomiting. ) 30 tablet 0   albuterol (PROVENTIL) (2.5 MG/3ML) 0.083% nebulizer solution Take 2.5 mg by nebulization daily as needed for wheezing or shortness of breath.     No facility-administered medications prior to visit.     Review of Systems  Review of Systems  Constitutional:  Negative.   Respiratory: Positive for shortness of breath. Negative for cough and wheezing.   Gastrointestinal: Positive for nausea.   Physical Exam  Pulse 62    Temp 98 F (36.7 C) (Oral)    Ht _0  (1.626 m)    Wt 87 lb (39.5 kg)    SpO2 92%    BMI 14.93 kg/m  Physical Exam Constitutional:      General: She is not in acute distress.    Appearance: She is ill-appearing.     Comments: Underweight  Pulmonary:     Effort: Pulmonary effort is normal.     Breath sounds: Normal breath sounds. No wheezing or rhonchi.  Musculoskeletal:     Comments: In WC, generalized weakness  Neurological:     General: No focal deficit present.     Mental Status: She is alert. Mental status is at baseline.  Psychiatric:        Thought Content: Thought content normal.        Judgment: Judgment normal.      Lab Results:  CBC    Component Value Date/Time   WBC 5.7 03/31/2019 1027   WBC 45.3 (H) 03/28/2019 0430   RBC 3.70 (L) 03/31/2019 1027   HGB 11.6 (L) 03/31/2019 1027   HGB 14.1 05/27/2017 1459   HCT 34.8 (L) 03/31/2019 1027   HCT 41.1 05/27/2017 1459   PLT 162 03/31/2019 1027   PLT 239 05/27/2017 1459   MCV 94.1 03/31/2019 1027   MCV 92 05/27/2017 1459   MCH 31.4 03/31/2019 1027   MCHC 33.3 03/31/2019 1027   RDW 15.8 (H) 03/31/2019 1027   RDW 13.8 05/27/2017 1459   LYMPHSABS 0.9 03/31/2019 1027   LYMPHSABS 2.9 05/27/2017 1459   MONOABS 0.1 03/31/2019 1027   EOSABS 0.0 03/31/2019 1027   EOSABS 0.1 05/27/2017 1459   BASOSABS 0.1 03/31/2019 1027   BASOSABS 0.0 05/27/2017 1459    BMET    Component Value Date/Time   NA 134 (L) 03/31/2019 1027   NA 130 (L) 01/26/2019 1153   K 4.1 03/31/2019 1027   CL 101 03/31/2019 1027   CO2 25 03/31/2019 1027   GLUCOSE 97 03/31/2019 1027   BUN 13 03/31/2019 1027   BUN 5 (L) 01/26/2019 1153   CREATININE 0.43 (L) 03/31/2019 1027   CREATININE 0.69 06/08/2016 1128   CALCIUM 8.7 (L) 03/31/2019 1027   GFRNONAA >60 03/31/2019 1027   GFRNONAA  89 06/08/2016 1128   GFRAA >60 03/31/2019 1027   GFRAA >89 06/08/2016 1128    BNP    Component Value Date/Time   BNP 144.3 (H) 02/15/2019 1711    ProBNP No results found for: PROBNP  Imaging: Ct Angio Head W Or Wo Contrast  Result Date: 03/18/2019 CLINICAL DATA:  Initial evaluation for acute diplopia. EXAM: CT ANGIOGRAPHY HEAD AND NECK TECHNIQUE: Multidetector CT imaging  of the head and neck was performed using the standard protocol during bolus administration of intravenous contrast. Multiplanar CT image reconstructions and MIPs were obtained to evaluate the vascular anatomy. Carotid stenosis measurements (when applicable) are obtained utilizing NASCET criteria, using the distal internal carotid diameter as the denominator. CONTRAST:  41m OMNIPAQUE IOHEXOL 350 MG/ML SOLN COMPARISON:  Prior head CT from earlier the same day. FINDINGS: CTA NECK FINDINGS Aortic arch: Partially visualized aortic arch of normal caliber. Origin of the right brachiocephalic and left common carotid arteries not visualized on this exam. Scattered atheromatous plaque within the visualized aortic arch and about the origin of the left subclavian artery without hemodynamically significant stenosis. Right carotid system: Eccentric calcified plaque about the origin of the right CCA with associated narrowing of up to approximately 40% by NASCET criteria. Right CCA mildly tortuous with scattered atheromatous plaque distally without high-grade stenosis. Bulky calcified plaque about the right bifurcation/proximal right ICA. Associated stenosis of up to approximately 50% about the origin of the right ICA distally, right ICA mildly tortuous with scattered atheromatous plaque without additional high-grade stenosis, dissection, or occlusion. Left carotid system: Origin of the left CCA not visible on this exam. Scattered atheromatous plaque throughout the visualized left CCA with mild multifocal narrowing. Bulky mixed plaque about the  left bifurcation/proximal left ICA with associated stenosis of up to 50% by NASCET criteria. Additional atheromatous plaque within the distal left ICA with associated focal stenosis of up to 65% by NASCET criteria (series 7, image 148). Left ICA otherwise patent to the skull base. Vertebral arteries: Both vertebral arteries arise from the subclavian arteries. Right vertebral artery dominant. Mild scattered atheromatous plaque within the right V1 and V2 segments with associated mild multifocal stenosis. Right vertebral otherwise widely patent to the skull base. Hypoplastic left vertebral artery occluded at its origin, and remains largely occluded within the neck. Distal reconstitution via muscular branches at the left V3 segment (series 7, image 163). Attenuated irregular flow seen within the hypoplastic left vertebral artery as it crosses into the cranial vault. Skeleton: Advanced compression deformity partially seen involving the T5 vertebral body with at least 80% height loss, age indeterminate, but new relative to recent CT from 01/16/2019. Irregular sclerotic lesion within the T1 vertebral body stable as compared to multiple previous exams, and presumably benign. No other discrete lytic or blastic osseous lesions. Moderate multilevel facet arthrosis noted within the cervical spine, left slightly worse than right. Degenerative changes noted about the TMJs bilaterally. Other neck: No other acute soft tissue abnormality within the neck. Prominent 1 cm left no noted adjacent to the left lobe of thyroid (series 5, image 31). Upper chest: Visualized upper chest demonstrates no acute finding. Upper lobe predominant centrilobular emphysema. Right-sided Port-A-Cath noted. Review of the MIP images confirms the above findings CTA HEAD FINDINGS Anterior circulation: Petrous right ICA widely patent. Focal atheromatous plaque within the petrous left ICA with associated short-segment mild stenosis. Extensive atheromatous  plaque within the cavernous/supraclinoid ICAs with resultant moderate to severe diffuse stenosis (estimated 50-75% bilaterally, left worse than right). ICA termini well perfused. Right A1 patent. A hypoplastic left A1 appears occluded. Normal anterior communicating artery. Anterior cerebral arteries patent to their distal aspects without stenosis or occlusion. No M1 stenosis or occlusion. Normal MCA bifurcations. Distal MCA branches well perfused and symmetric. Posterior circulation: Focal plaque within the dominant right vertebral artery as it crosses into the cranial vault without significant stenosis. Dominant right V4 segment otherwise patent to the vertebrobasilar junction. Patent  right PICA. Hypoplastic left vertebral artery attenuated and irregular. Superimposed short-segment moderate to severe proximal left V4 stenosis prior to the takeoff of the left PICA (series 7, image 151). Additional short-segment moderate stenosis within the distal left V4 segment just prior to the vertebrobasilar junction. Left PICA patent. Basilar irregular but widely patent to its distal aspect. Superior cerebral arteries patent bilaterally. Both of the posterior cerebral arteries primarily supplied via the basilar. PCAs demonstrate scattered multifocal atheromatous irregularity but are well perfused to their distal aspects without high-grade stenosis. Venous sinuses: Patent. Anatomic variants: None significant. Review of the MIP images confirms the above findings IMPRESSION: 1. Negative CTA for emergent large vessel occlusion. 2. Bulky atheromatous plaque about the origins of the internal carotid arteries with associated stenoses of up to 50% by NASCET criteria bilaterally. Additional short-segment 50% stenosis involving the distal left ICA as above. 3. Heavy atherosclerotic change throughout the carotid siphons with associated moderate to severe multifocal stenoses, left worse than right. 4. Hypoplastic left vertebral artery,  largely occluded in the neck, with distal reconstitution at the V3 segment. Superimposed moderate to severe multifocal left V4 stenoses as above. 5. Hypoplastic and occluded left A1 segment. Anterior cerebral arteries supplied via the right carotid artery system. 6. Severe compression deformity involving the T5 vertebral body, age indeterminate, but new relative to most recent chest CT from 01/16/2019. Correlation with history and physical exam recommended. 7. Ill-defined sclerotic lesion within the T1 vertebral body, indeterminate, but stable relative to multiple previous exams. 8. Emphysema. Critical Value/emergent results were called by telephone at the time of interpretation on 03/18/2019 at 8:09 pm to Dr. Gareth Morgan , who verbally acknowledged these results. Electronically Signed   By: Jeannine Boga M.D.   On: 03/18/2019 20:09   Dg Chest 2 View  Result Date: 03/28/2019 CLINICAL DATA:  Chest pain EXAM: CHEST - 2 VIEW COMPARISON:  01/21/2019 chest radiograph. FINDINGS: Right internal jugular Port-A-Cath terminates at the cavoatrial junction. Stable cardiomediastinal silhouette with normal heart size. No pneumothorax. No pleural effusion. Hyperinflated lungs. No pulmonary edema. No acute consolidative airspace disease. IMPRESSION: No acute cardiopulmonary disease.  Hyperinflated lungs suggest COPD. Electronically Signed   By: Ilona Sorrel M.D.   On: 03/28/2019 05:26   Ct Angio Neck W And/or Wo Contrast  Result Date: 03/18/2019 CLINICAL DATA:  Initial evaluation for acute diplopia. EXAM: CT ANGIOGRAPHY HEAD AND NECK TECHNIQUE: Multidetector CT imaging of the head and neck was performed using the standard protocol during bolus administration of intravenous contrast. Multiplanar CT image reconstructions and MIPs were obtained to evaluate the vascular anatomy. Carotid stenosis measurements (when applicable) are obtained utilizing NASCET criteria, using the distal internal carotid diameter as the  denominator. CONTRAST:  52m OMNIPAQUE IOHEXOL 350 MG/ML SOLN COMPARISON:  Prior head CT from earlier the same day. FINDINGS: CTA NECK FINDINGS Aortic arch: Partially visualized aortic arch of normal caliber. Origin of the right brachiocephalic and left common carotid arteries not visualized on this exam. Scattered atheromatous plaque within the visualized aortic arch and about the origin of the left subclavian artery without hemodynamically significant stenosis. Right carotid system: Eccentric calcified plaque about the origin of the right CCA with associated narrowing of up to approximately 40% by NASCET criteria. Right CCA mildly tortuous with scattered atheromatous plaque distally without high-grade stenosis. Bulky calcified plaque about the right bifurcation/proximal right ICA. Associated stenosis of up to approximately 50% about the origin of the right ICA distally, right ICA mildly tortuous with scattered atheromatous plaque without  additional high-grade stenosis, dissection, or occlusion. Left carotid system: Origin of the left CCA not visible on this exam. Scattered atheromatous plaque throughout the visualized left CCA with mild multifocal narrowing. Bulky mixed plaque about the left bifurcation/proximal left ICA with associated stenosis of up to 50% by NASCET criteria. Additional atheromatous plaque within the distal left ICA with associated focal stenosis of up to 65% by NASCET criteria (series 7, image 148). Left ICA otherwise patent to the skull base. Vertebral arteries: Both vertebral arteries arise from the subclavian arteries. Right vertebral artery dominant. Mild scattered atheromatous plaque within the right V1 and V2 segments with associated mild multifocal stenosis. Right vertebral otherwise widely patent to the skull base. Hypoplastic left vertebral artery occluded at its origin, and remains largely occluded within the neck. Distal reconstitution via muscular branches at the left V3 segment  (series 7, image 163). Attenuated irregular flow seen within the hypoplastic left vertebral artery as it crosses into the cranial vault. Skeleton: Advanced compression deformity partially seen involving the T5 vertebral body with at least 80% height loss, age indeterminate, but new relative to recent CT from 01/16/2019. Irregular sclerotic lesion within the T1 vertebral body stable as compared to multiple previous exams, and presumably benign. No other discrete lytic or blastic osseous lesions. Moderate multilevel facet arthrosis noted within the cervical spine, left slightly worse than right. Degenerative changes noted about the TMJs bilaterally. Other neck: No other acute soft tissue abnormality within the neck. Prominent 1 cm left no noted adjacent to the left lobe of thyroid (series 5, image 31). Upper chest: Visualized upper chest demonstrates no acute finding. Upper lobe predominant centrilobular emphysema. Right-sided Port-A-Cath noted. Review of the MIP images confirms the above findings CTA HEAD FINDINGS Anterior circulation: Petrous right ICA widely patent. Focal atheromatous plaque within the petrous left ICA with associated short-segment mild stenosis. Extensive atheromatous plaque within the cavernous/supraclinoid ICAs with resultant moderate to severe diffuse stenosis (estimated 50-75% bilaterally, left worse than right). ICA termini well perfused. Right A1 patent. A hypoplastic left A1 appears occluded. Normal anterior communicating artery. Anterior cerebral arteries patent to their distal aspects without stenosis or occlusion. No M1 stenosis or occlusion. Normal MCA bifurcations. Distal MCA branches well perfused and symmetric. Posterior circulation: Focal plaque within the dominant right vertebral artery as it crosses into the cranial vault without significant stenosis. Dominant right V4 segment otherwise patent to the vertebrobasilar junction. Patent right PICA. Hypoplastic left vertebral artery  attenuated and irregular. Superimposed short-segment moderate to severe proximal left V4 stenosis prior to the takeoff of the left PICA (series 7, image 151). Additional short-segment moderate stenosis within the distal left V4 segment just prior to the vertebrobasilar junction. Left PICA patent. Basilar irregular but widely patent to its distal aspect. Superior cerebral arteries patent bilaterally. Both of the posterior cerebral arteries primarily supplied via the basilar. PCAs demonstrate scattered multifocal atheromatous irregularity but are well perfused to their distal aspects without high-grade stenosis. Venous sinuses: Patent. Anatomic variants: None significant. Review of the MIP images confirms the above findings IMPRESSION: 1. Negative CTA for emergent large vessel occlusion. 2. Bulky atheromatous plaque about the origins of the internal carotid arteries with associated stenoses of up to 50% by NASCET criteria bilaterally. Additional short-segment 50% stenosis involving the distal left ICA as above. 3. Heavy atherosclerotic change throughout the carotid siphons with associated moderate to severe multifocal stenoses, left worse than right. 4. Hypoplastic left vertebral artery, largely occluded in the neck, with distal reconstitution at the V3  segment. Superimposed moderate to severe multifocal left V4 stenoses as above. 5. Hypoplastic and occluded left A1 segment. Anterior cerebral arteries supplied via the right carotid artery system. 6. Severe compression deformity involving the T5 vertebral body, age indeterminate, but new relative to most recent chest CT from 01/16/2019. Correlation with history and physical exam recommended. 7. Ill-defined sclerotic lesion within the T1 vertebral body, indeterminate, but stable relative to multiple previous exams. 8. Emphysema. Critical Value/emergent results were called by telephone at the time of interpretation on 03/18/2019 at 8:09 pm to Dr. Gareth Morgan , who  verbally acknowledged these results. Electronically Signed   By: Jeannine Boga M.D.   On: 03/18/2019 20:09   Mr Jeri Cos NL Contrast  Result Date: 03/19/2019 CLINICAL DATA:  Initial evaluation for acute visual changes, diplopia. EXAM: MRI HEAD WITHOUT AND WITH CONTRAST TECHNIQUE: Multiplanar, multiecho pulse sequences of the brain and surrounding structures were obtained without and with intravenous contrast. CONTRAST:  5 cc of Gadavist. COMPARISON:  Prior CT and CTA from earlier same day as well as previous brain MRI from 02/13/2019. FINDINGS: Brain: Examination severely degraded by motion artifact, limiting evaluation. 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 Underlying age-related cerebral atrophy with mild chronic microvascular ischemic disease. 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 No abnormal foci of restricted diffusion to suggest acute or subacute ischemia on today's exam. Previously seen focus of diffusion abnormality with associated ring enhancement within the left occipital lobe is not seen on today's exam, and appears to have resolved, suggesting that this finding represented a subacute infarct on prior study. No residual FLAIR signal abnormality now seen within this region. No other mass lesion or definite abnormal enhancement on today's exam. No midline shift or mass effect. No evidence for acute intracranial hemorrhage. No hydrocephalus. No extra-axial fluid collection. Diffuse enlargement with heterogeneous enhancement of the pituitary gland again noted, grossly similar, but better evaluated on recent brain MRI given technical limitations due to motion on this exam. Vascular: Major intracranial vascular flow voids are maintained. Skull and upper cervical spine: Craniocervical junction grossly within normal limits. No definite focal marrow replacing lesion on this motion degraded exam. Scalp soft tissues demonstrate  no acute finding. Sinuses/Orbits: Patient status post bilateral ocular lens replacement. Globes and orbital soft tissues demonstrate no acute finding. Paranasal sinuses are clear. No appreciable mastoid effusion. Inner ear structures grossly normal. Other: None. IMPRESSION: 1. Technically limited exam due to extensive motion artifact. 2. No acute intracranial infarct or other abnormality identified. 3. Apparent interval resolution of previously seen 6 mm ring-enhancing lesion within the left occipital lobe, which most likely reflected a subacute infarct on previous exam. No evidence for parenchymal mass or metastatic disease on today's study. Given the technical limitations and extensive motion artifact on this exam, a short interval follow-up study in 4-6 weeks to confirm these findings is suggested. 4. Grossly similar appearance of enlarged pituitary gland, better seen on previous MRI. 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 Attention at follow-up recommended. Electronically Signed   By: Jeannine Boga M.D.   On: 03/19/2019 00:32   Nm Pet Image Restag (ps) Skull Base To Thigh  Result Date: 03/26/2019 CLINICAL DATA:  The LEFT supraclavicular node may be amenable to ultrasound-guided percutaneous biopsy. Treatment strategy for small cell lung cancer. EXAM: NUCLEAR MEDICINE PET SKULL BASE TO THIGH TECHNIQUE: 4.9 mCi F-18 FDG was injected intravenously. Full-ring PET imaging was performed from the skull base to  thigh after the radiotracer. CT data was obtained and used for attenuation correction and anatomic localization. Fasting blood glucose: 93 mg/dl COMPARISON:  PET-CT 06/24/2018 FINDINGS: Mediastinal blood pool activity: SUV max 1.6 Liver activity: SUV max NA NECK: Hypermetabolic lymph node in the LEFT supraclavicular nodal station adjacent to the lower pole of the LEFT lobe of thyroid gland measures 1.2 cm with SUV max equal 4.7. (Image 47/4) Incidental CT findings: none CHEST:  Resolution of the hypermetabolic nodule in the LEFT upper lobe. New hypermetabolic middle mediastinal lymph nodes along the descending thoracic aorta . Lymph node LEFT of the descending aorta measures 7 mm with SUV max equal 7.9. Lymph node at the same level (image 97/4) on the RIGHT measures 1.3 cm SUV max equal 4.9. More inferior node on the RIGHT measures 12 mm (image 105 with) SUV max equal 3.0. Ill-defined nodule in the RIGHT upper lobe measuring 6 mm unchanged in size from prior. There is mild bronchial thickening adjacent to this nodule. There is mild metabolic activity SUV max equal 1.8 similar prior. Metabolic activity associated with a RIGHT upper lobe cystic lesion is decreased. Incidental CT findings: none ABDOMEN/PELVIS: New hypermetabolic lymph nodes along the iliac chains. Example cluster of nodes along the LEFT common iliac measuring 8 mm with SUV max equal 5.1 (image 148 fused data set). Similar nodes adjacent to the RIGHT common iliac with a 12 mm node (image 137/4) with SUV max equal 5.0. No hypermetabolic inguinal nodes. Similar lymph nodes LEFT aorta at the level of the kidneys SUV max equal 5.0. Hypermetabolic lymph nodes in the porta hepatis Incidental CT findings: none SKELETON: No focal hypermetabolic activity to suggest skeletal metastasis. Incidental CT findings: none IMPRESSION: 1. New hypermetabolic lymphadenopathy in the chest, abdomen, and pelvis concerning for lymphoma. 2. Hypermetabolic lymph nodes involve the posterior mediastinum periaortic nodal stations, upper abdominal periaortic nodal stations, periportal nodes and common iliac nodal stations. Additional solitary LEFT supraclavicular node. 3. LEFT supraclavicular node may be amenable to ultrasound-guided percutaneous biopsy. 4. Resolution of hypermetabolic pulmonary nodule in the LEFT upper lobe. 5. Mild metabolic activity associated with a RIGHT upper lobe pulmonary nodule. Recommend attention on follow-up Electronically  Signed   By: Suzy Bouchard M.D.   On: 03/26/2019 15:17   Ct Head Code Stroke Wo Contrast  Result Date: 03/18/2019 CLINICAL DATA:  Code stroke. Initial evaluation for acute visual changes, visual loss. EXAM: CT HEAD WITHOUT CONTRAST TECHNIQUE: Contiguous axial images were obtained from the base of the skull through the vertex without intravenous contrast. COMPARISON:  None. FINDINGS: Brain: Age-related cerebral atrophy with chronic microvascular ischemic disease. 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 No acute intracranial hemorrhage. No acute large vessel territory infarct. No visible mass lesion, mass effect, or midline shift. No hydrocephalus. No extra-axial fluid collection. Vascular: No hyperdense vessel. Scattered vascular calcifications noted within the carotid siphons. Skull: Scalp soft tissues demonstrate no acute finding. Calvarium intact. Sinuses/Orbits: Globes and orbital soft tissues demonstrate no acute finding. Patient status post bilateral ocular lens replacement. Paranasal sinuses are clear. No mastoid effusion. Other: None. ASPECTS [facility] Stroke Program Early CT Score) - Ganglionic level infarction (caudate, lentiform nuclei, internal capsule, insula, M1-M3 cortex): 7 - Supraganglionic infarction (M4-M6 cortex): 3 Total score (0-10 with 10 being normal): 10 IMPRESSION: 1. No acute intracranial infarct or other abnormality identified. 2. ASPECTS is 10. 3. Right cerebral heterotopia, grossly  stable, better evaluated on recent brain MRI. 4. 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 No other visible mass lesion. Critical Value/emergent results were called by telephone at the time of interpretation on 03/18/2019 at 7:30 pm to Dr. Gareth Morgan , who verbally acknowledged these results. Electronically Signed    By: Jeannine Boga M.D.   On: 03/18/2019 19:34     Assessment & Plan:   Chronic obstructive pulmonary disease (COPD) (HCC) - Moderate/severe obstruction on PFTs in 2018 - Lungs clear today, no active wheezing  - Continues Bevespi twice daily - Add ipratropium-albuterol nebulizer q 6 hours prn sob/wheezing - FU in 2-3 months with Dr. Jessica Priest cell lung cancer, right lower lobe ([place]) - S/p two cycles of systemic chemotherapy with carboplatin, etoposide and Imfinzi  - Following with Dr. 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

## 2019-04-01 NOTE — Assessment & Plan Note (Signed)
-   Moderate/severe obstruction on PFTs in 2018 - Lungs clear today, no active wheezing  - Continues Bevespi twice daily - Add ipratropium-albuterol nebulizer q 6 hours prn sob/wheezing - FU in 2-3 months with Dr. Vaughan Browner

## 2019-04-01 NOTE — Patient Instructions (Addendum)
Lungs sounded clear on exam  O2 level stable 92% on room air  Continue Bevespi twice daily  RX duoneb (ipratropium-albuterol) every 6 hours as needed for shortness of breath  Referral for new nebulizer machine   Follow-up with Dr. Vaughan Browner in 2-3 months or sooner if needed

## 2019-04-01 NOTE — Assessment & Plan Note (Signed)
-   Repeat brain MRI in July showed resolution previously seen 67mm left occipital lobe lesion and grossly similar appearing enlargement of pituitary gland

## 2019-04-01 NOTE — Assessment & Plan Note (Signed)
-   S/p two cycles of systemic chemotherapy with carboplatin, etoposide and Imfinzi  - Following with Dr. Mohamed/oncology

## 2019-04-02 ENCOUNTER — Ambulatory Visit (HOSPITAL_COMMUNITY): Payer: Medicare Other

## 2019-04-06 ENCOUNTER — Other Ambulatory Visit: Payer: Self-pay

## 2019-04-06 ENCOUNTER — Ambulatory Visit: Payer: Medicare Other | Admitting: Nurse Practitioner

## 2019-04-06 ENCOUNTER — Inpatient Hospital Stay: Payer: Medicare Other

## 2019-04-06 DIAGNOSIS — D352 Benign neoplasm of pituitary gland: Secondary | ICD-10-CM

## 2019-04-06 DIAGNOSIS — Z5112 Encounter for antineoplastic immunotherapy: Secondary | ICD-10-CM | POA: Diagnosis not present

## 2019-04-06 DIAGNOSIS — C3431 Malignant neoplasm of lower lobe, right bronchus or lung: Secondary | ICD-10-CM

## 2019-04-06 LAB — CMP (CANCER CENTER ONLY)
ALT: 19 U/L (ref 0–44)
AST: 14 U/L — ABNORMAL LOW (ref 15–41)
Albumin: 3.3 g/dL — ABNORMAL LOW (ref 3.5–5.0)
Alkaline Phosphatase: 118 U/L (ref 38–126)
Anion gap: 10 (ref 5–15)
BUN: 6 mg/dL — ABNORMAL LOW (ref 8–23)
CO2: 26 mmol/L (ref 22–32)
Calcium: 8.6 mg/dL — ABNORMAL LOW (ref 8.9–10.3)
Chloride: 99 mmol/L (ref 98–111)
Creatinine: 0.53 mg/dL (ref 0.44–1.00)
GFR, Est AFR Am: >60 mL/min (ref >60)
GFR, Estimated: >60 mL/min (ref >60)
Glucose, Bld: 101 mg/dL — ABNORMAL HIGH (ref 70–99)
Potassium: 3.8 mmol/L (ref 3.5–5.1)
Sodium: 135 mmol/L (ref 135–145)
Total Bilirubin: <0.2 mg/dL — ABNORMAL LOW (ref 0.3–1.2)
Total Protein: 5.9 g/dL — ABNORMAL LOW (ref 6.5–8.1)

## 2019-04-06 LAB — CBC WITH DIFFERENTIAL (CANCER CENTER ONLY)
Abs Immature Granulocytes: 1.74 10*3/uL — ABNORMAL HIGH (ref 0.00–0.07)
Basophils Absolute: 0.1 10*3/uL (ref 0.0–0.1)
Basophils Relative: 1 %
Eosinophils Absolute: 0 10*3/uL (ref 0.0–0.5)
Eosinophils Relative: 0 %
HCT: 32.4 % — ABNORMAL LOW (ref 36.0–46.0)
Hemoglobin: 11.1 g/dL — ABNORMAL LOW (ref 12.0–15.0)
Immature Granulocytes: 13 %
Lymphocytes Relative: 14 %
Lymphs Abs: 1.9 10*3/uL (ref 0.7–4.0)
MCH: 32.1 pg (ref 26.0–34.0)
MCHC: 34.3 g/dL (ref 30.0–36.0)
MCV: 93.6 fL (ref 80.0–100.0)
Monocytes Absolute: 1.2 10*3/uL — ABNORMAL HIGH (ref 0.1–1.0)
Monocytes Relative: 9 %
Neutro Abs: 8.3 10*3/uL — ABNORMAL HIGH (ref 1.7–7.7)
Neutrophils Relative %: 63 %
Platelet Count: 290 10*3/uL (ref 150–400)
RBC: 3.46 MIL/uL — ABNORMAL LOW (ref 3.87–5.11)
RDW: 16.6 % — ABNORMAL HIGH (ref 11.5–15.5)
WBC Count: 13.3 10*3/uL — ABNORMAL HIGH (ref 4.0–10.5)
nRBC: 0 % (ref 0.0–0.2)

## 2019-04-06 LAB — TSH: TSH: 2.309 u[IU]/mL (ref 0.308–3.960)

## 2019-04-07 ENCOUNTER — Ambulatory Visit (HOSPITAL_COMMUNITY): Payer: Medicare Other

## 2019-04-07 ENCOUNTER — Other Ambulatory Visit: Payer: Self-pay | Admitting: Internal Medicine

## 2019-04-07 ENCOUNTER — Ambulatory Visit: Admit: 2019-04-07 | Payer: Medicare Other

## 2019-04-07 LAB — PROLACTIN: Prolactin: 15.6 ng/mL (ref 4.8–23.3)

## 2019-04-07 SURGERY — MRI WITH ANESTHESIA
Anesthesia: General

## 2019-04-13 ENCOUNTER — Encounter: Payer: Self-pay | Admitting: Internal Medicine

## 2019-04-13 ENCOUNTER — Other Ambulatory Visit: Payer: Self-pay

## 2019-04-13 ENCOUNTER — Telehealth: Payer: Self-pay | Admitting: Internal Medicine

## 2019-04-13 ENCOUNTER — Inpatient Hospital Stay: Payer: Medicare Other

## 2019-04-13 ENCOUNTER — Inpatient Hospital Stay (HOSPITAL_BASED_OUTPATIENT_CLINIC_OR_DEPARTMENT_OTHER): Payer: Medicare Other | Admitting: Internal Medicine

## 2019-04-13 VITALS — BP 138/74 | HR 85 | Temp 98.3°F | Resp 18 | Ht 64.0 in | Wt 88.3 lb

## 2019-04-13 DIAGNOSIS — Z5112 Encounter for antineoplastic immunotherapy: Secondary | ICD-10-CM | POA: Diagnosis not present

## 2019-04-13 DIAGNOSIS — J441 Chronic obstructive pulmonary disease with (acute) exacerbation: Secondary | ICD-10-CM

## 2019-04-13 DIAGNOSIS — Z5111 Encounter for antineoplastic chemotherapy: Secondary | ICD-10-CM | POA: Diagnosis not present

## 2019-04-13 DIAGNOSIS — C3431 Malignant neoplasm of lower lobe, right bronchus or lung: Secondary | ICD-10-CM

## 2019-04-13 DIAGNOSIS — C3411 Malignant neoplasm of upper lobe, right bronchus or lung: Secondary | ICD-10-CM | POA: Diagnosis not present

## 2019-04-13 DIAGNOSIS — Z95828 Presence of other vascular implants and grafts: Secondary | ICD-10-CM

## 2019-04-13 LAB — CMP (CANCER CENTER ONLY)
ALT: 16 U/L (ref 0–44)
AST: 13 U/L — ABNORMAL LOW (ref 15–41)
Albumin: 3.3 g/dL — ABNORMAL LOW (ref 3.5–5.0)
Alkaline Phosphatase: 106 U/L (ref 38–126)
Anion gap: 8 (ref 5–15)
BUN: 7 mg/dL — ABNORMAL LOW (ref 8–23)
CO2: 22 mmol/L (ref 22–32)
Calcium: 8.6 mg/dL — ABNORMAL LOW (ref 8.9–10.3)
Chloride: 100 mmol/L (ref 98–111)
Creatinine: 0.48 mg/dL (ref 0.44–1.00)
GFR, Est AFR Am: >60 mL/min (ref >60)
GFR, Estimated: >60 mL/min (ref >60)
Glucose, Bld: 104 mg/dL — ABNORMAL HIGH (ref 70–99)
Potassium: 3.9 mmol/L (ref 3.5–5.1)
Sodium: 130 mmol/L — ABNORMAL LOW (ref 135–145)
Total Bilirubin: <0.2 mg/dL — ABNORMAL LOW (ref 0.3–1.2)
Total Protein: 6.1 g/dL — ABNORMAL LOW (ref 6.5–8.1)

## 2019-04-13 LAB — CBC WITH DIFFERENTIAL (CANCER CENTER ONLY)
Abs Immature Granulocytes: 0.49 10*3/uL — ABNORMAL HIGH (ref 0.00–0.07)
Basophils Absolute: 0.1 10*3/uL (ref 0.0–0.1)
Basophils Relative: 1 %
Eosinophils Absolute: 0 10*3/uL (ref 0.0–0.5)
Eosinophils Relative: 0 %
HCT: 31.3 % — ABNORMAL LOW (ref 36.0–46.0)
Hemoglobin: 10.9 g/dL — ABNORMAL LOW (ref 12.0–15.0)
Immature Granulocytes: 5 %
Lymphocytes Relative: 18 %
Lymphs Abs: 1.9 10*3/uL (ref 0.7–4.0)
MCH: 31.4 pg (ref 26.0–34.0)
MCHC: 34.8 g/dL (ref 30.0–36.0)
MCV: 90.2 fL (ref 80.0–100.0)
Monocytes Absolute: 0.8 10*3/uL (ref 0.1–1.0)
Monocytes Relative: 8 %
Neutro Abs: 7.1 10*3/uL (ref 1.7–7.7)
Neutrophils Relative %: 68 %
Platelet Count: 281 10*3/uL (ref 150–400)
RBC: 3.47 MIL/uL — ABNORMAL LOW (ref 3.87–5.11)
RDW: 15.9 % — ABNORMAL HIGH (ref 11.5–15.5)
WBC Count: 10.4 10*3/uL (ref 4.0–10.5)
nRBC: 0 % (ref 0.0–0.2)

## 2019-04-13 MED ORDER — SODIUM CHLORIDE 0.9 % IV SOLN
1500.0000 mg | Freq: Once | INTRAVENOUS | Status: AC
  Administered 2019-04-13: 11:00:00 1500 mg via INTRAVENOUS
  Filled 2019-04-13: qty 30

## 2019-04-13 MED ORDER — SODIUM CHLORIDE 0.9 % IV SOLN
Freq: Once | INTRAVENOUS | Status: AC
  Administered 2019-04-13: 10:00:00 via INTRAVENOUS
  Filled 2019-04-13: qty 250

## 2019-04-13 MED ORDER — SODIUM CHLORIDE 0.9% FLUSH
10.0000 mL | INTRAVENOUS | Status: DC | PRN
  Administered 2019-04-13: 10 mL
  Filled 2019-04-13: qty 10

## 2019-04-13 MED ORDER — SODIUM CHLORIDE 0.9 % IV SOLN
306.5000 mg | Freq: Once | INTRAVENOUS | Status: AC
  Administered 2019-04-13: 310 mg via INTRAVENOUS
  Filled 2019-04-13: qty 31

## 2019-04-13 MED ORDER — HEPARIN SOD (PORK) LOCK FLUSH 100 UNIT/ML IV SOLN
500.0000 [IU] | Freq: Once | INTRAVENOUS | Status: AC | PRN
  Administered 2019-04-13: 500 [IU]
  Filled 2019-04-13: qty 5

## 2019-04-13 MED ORDER — SODIUM CHLORIDE 0.9% FLUSH
10.0000 mL | Freq: Once | INTRAVENOUS | Status: AC
  Administered 2019-04-13: 10 mL
  Filled 2019-04-13: qty 10

## 2019-04-13 MED ORDER — PALONOSETRON HCL INJECTION 0.25 MG/5ML
INTRAVENOUS | Status: AC
  Filled 2019-04-13: qty 5

## 2019-04-13 MED ORDER — PALONOSETRON HCL INJECTION 0.25 MG/5ML
0.2500 mg | Freq: Once | INTRAVENOUS | Status: AC
  Administered 2019-04-13: 10:00:00 0.25 mg via INTRAVENOUS

## 2019-04-13 MED ORDER — SODIUM CHLORIDE 0.9 % IV SOLN
100.0000 mg/m2 | Freq: Once | INTRAVENOUS | Status: AC
  Administered 2019-04-13: 13:00:00 140 mg via INTRAVENOUS
  Filled 2019-04-13: qty 7

## 2019-04-13 MED ORDER — SODIUM CHLORIDE 0.9 % IV SOLN
Freq: Once | INTRAVENOUS | Status: AC
  Administered 2019-04-13: 11:00:00 via INTRAVENOUS
  Filled 2019-04-13: qty 5

## 2019-04-13 NOTE — Patient Instructions (Signed)
[place] Discharge Instructions for Patients Receiving Chemotherapy  Today you received the following chemotherapy agents :  Durvalumab, Carboplatin and Etoposide.  To help prevent nausea and vomiting after your treatment, we encourage you to take your nausea medication as prescribed.  TAKE COMPAZINE 10 MG BY MOUTH EVERY 6 HOURS AS NEEDED FOR NAUSEA. DO NOT DRIVE AFTER TAKING THIS MEDICATION AS IT CAN CAUSE DROWSINESS.  DO DRINK LOTS OF FLUIDS AS TOLERATED.  DO NOT EAT GREASY NOR SPICY FOODS.   If you develop nausea and vomiting that is not controlled by your nausea medication, call the clinic.   BELOW ARE SYMPTOMS THAT SHOULD BE REPORTED IMMEDIATELY:  *FEVER GREATER THAN 100.5 F  *CHILLS WITH OR WITHOUT FEVER  NAUSEA AND VOMITING THAT IS NOT CONTROLLED WITH YOUR NAUSEA MEDICATION  *UNUSUAL SHORTNESS OF BREATH  *UNUSUAL BRUISING OR BLEEDING  TENDERNESS IN MOUTH AND THROAT WITH OR WITHOUT PRESENCE OF ULCERS  *URINARY PROBLEMS  *BOWEL PROBLEMS  UNUSUAL RASH Items with * indicate a potential emergency and should be followed up as soon as possible.  Feel free to call the clinic should you have any questions or concerns. The clinic phone number is [phone].  Please show the [place] at check-in to the Emergency Department and triage nurse.

## 2019-04-13 NOTE — Telephone Encounter (Signed)
Scheduled appt per 8/24 los - pt to get an updated schedule next visit.

## 2019-04-13 NOTE — Progress Notes (Signed)
[facility] [phone]   Fax:([phone]) [place], [place], [place] [address]  DIAGNOSIS:  1) extensive stage small cell lung cancer presenting with mediastinal lymphadenopathy in addition to abdominal lymphadenopathy as well as adrenal metastasis diagnosed in June 2020. 2)  History of bilateral adenocarcinoma of the lung presenting with right upper lobe and left upper lobe pulmonary nodules  PRIOR THERAPY: status post curative SBRT completed in January 2020.  CURRENT THERAPY: Systemic chemotherapy with carboplatin for AUC of 5 on day 1, etoposide 100 mg/M2 and Imfinzi 1500 mg IV every 3 weeks.  Status post 2 cycles.  INTERVAL HISTORY: Pamela Russell 73 y.o. female returns to the clinic today for follow-up visit.  The patient is feeling fine today with no concerning complaints except for mild fatigue.  She gained more than 1 pound since her last visit.  She eats better.  She also has improvement of her shortness of breath.  She denied having any chest pain, cough or hemoptysis.  She denied having any nausea, vomiting, diarrhea or constipation.  She was recently evaluated in the hospital for questionable stroke but her imaging studies were unremarkable.  She also had a PET scan for baseline evaluation of her disease.  The patient is here today for evaluation before starting cycle #3.  MEDICAL HISTORY: Past Medical History:  Diagnosis Date   Allergic rhinitis    Asthma 2008   Chronic obstructive pulmonary disease (COPD) ([facility]) 06/08/2016   Meds: Spiriva, albuterol inhaler PFT (11/29/2016): Moderate-severe obstruction by PFT 11/2016   Ear itching 07/05/2017   Grieving 06/08/2016   History of pneumonia    x4   Hyperlipidemia 09/10/2016   On atorvastatin 40 mg QD, ASA 81 mg QD ASCVD risk 17.1%, mod-high intensity statin Risk factors: FHx sig for MI: mother 40s-50s, and father age 16, HTN, smoker    Hypertension    Night sweats 06/08/2016   NSCL Ca dx'd 05/2018   xrt comp 09/2018   Pulmonary nodules 06/12/2017   Tobacco use disorder 07/06/2016   On nicotine replacement patch. Smoking cigs since age 57. Has 60 pack year history. Has COPD dx on SABA and LAMA.    ALLERGIES:  is allergic to codeine; penicillins; and anoro ellipta [umeclidinium-vilanterol].  MEDICATIONS:  Current Outpatient Medications  Medication Sig Dispense Refill   albuterol (VENTOLIN HFA) 108 (90 Base) MCG/ACT inhaler USE 2 INHALATIONS BY MOUTH  EVERY 6 HOURS AS NEEDED FOR WHEEZING OR SHORTNESS OF  BREATH (Patient taking differently: Inhale 2 puffs into the lungs every 6 (six) hours as needed for wheezing or shortness of breath. ) 17 g 2   aspirin EC 81 MG tablet Take 81 mg by mouth daily.     cetirizine (ZYRTEC) 10 MG tablet Take 10 mg by mouth daily.     fluticasone (FLONASE) 50 MCG/ACT nasal spray Place 2 sprays into both nostrils daily. 16 g 1   glucosamine-chondroitin 500-400 MG tablet Take 1 tablet by mouth daily.      Glycopyrrolate-Formoterol (BEVESPI AEROSPHERE) 9-4.8 MCG/ACT AERO Inhale 2 puffs into the lungs 2 (two) times daily. 3 Inhaler 2   ibuprofen (ADVIL) 200 MG tablet Take 400 mg by mouth every 6 (six) hours as needed for moderate pain.     ipratropium-albuterol (DUONEB) 0.5-2.5 (3) MG/3ML SOLN Take 3 mLs by nebulization every 6 (six) hours as needed. 360 mL 3   lidocaine-prilocaine (EMLA) cream Apply 1 application  topically daily as needed (port).      mometasone (ELOCON) 0.1 % lotion Apply 1 application topically daily.     Multiple Vitamin (MULTIVITAMIN WITH MINERALS) TABS tablet Take 1 tablet by mouth daily.     prochlorperazine (COMPAZINE) 10 MG tablet TAKE 1 TABLET BY MOUTH EVERY 6 HOURS AS NEEDED FOR NAUSEA AND VOMITING 30 tablet 0   No current facility-administered medications for this visit.     SURGICAL HISTORY:  Past Surgical History:  Procedure Laterality Date    CATARACT EXTRACTION Left 05/08/2016   CATARACT EXTRACTION Right 05/22/2016   IR IMAGING GUIDED PORT INSERTION  02/25/2019   RADIOLOGY WITH ANESTHESIA N/A 02/13/2019   Procedure: BRAIN MRI;  Surgeon: Radiologist, Medication, MD;  Location: [facility];  Service: Radiology;  Laterality: N/A;   TOTAL ABDOMINAL HYSTERECTOMY W/ BILATERAL SALPINGOOPHORECTOMY Bilateral 1975   VIDEO BRONCHOSCOPY WITH ENDOBRONCHIAL NAVIGATION Left 07/09/2018   Procedure: VIDEO BRONCHOSCOPY WITH ENDOBRONCHIAL NAVIGATION;  Surgeon: Collene Gobble, MD;  Location: [facility];  Service: Thoracic;  Laterality: Left;    REVIEW OF SYSTEMS:  A comprehensive review of systems was negative except for: Constitutional: positive for fatigue Musculoskeletal: positive for back pain   PHYSICAL EXAMINATION: General appearance: alert, cooperative, fatigued and no distress Head: Normocephalic, without obvious abnormality, atraumatic Neck: no adenopathy, no JVD, supple, symmetrical, trachea midline and thyroid not enlarged, symmetric, no tenderness/mass/nodules Lymph nodes: Cervical, supraclavicular, and axillary nodes normal. Resp: clear to auscultation bilaterally Back: symmetric, no curvature. ROM normal. No CVA tenderness. Cardio: regular rate and rhythm, S1, S2 normal, no murmur, click, rub or gallop GI: soft, non-tender; bowel sounds normal; no masses,  no organomegaly Extremities: extremities normal, atraumatic, no cyanosis or edema  ECOG PERFORMANCE STATUS: 1 - Symptomatic but completely ambulatory  Blood pressure 138/74, pulse 85, temperature 98.3 F (36.8 C), temperature source Oral, resp. rate 18, height 5\' 4"  (1.626 m), weight 88 lb 4.8 oz (40.1 kg), SpO2 100 %.  LABORATORY DATA: Lab Results  Component Value Date   WBC 13.3 (H) 04/06/2019   HGB 11.1 (L) 04/06/2019   HCT 32.4 (L) 04/06/2019   MCV 93.6 04/06/2019   PLT 290 04/06/2019      Chemistry      Component Value Date/Time   NA 135 04/06/2019 1259   NA 130 (L)  01/26/2019 1153   K 3.8 04/06/2019 1259   CL 99 04/06/2019 1259   CO2 26 04/06/2019 1259   BUN 6 (L) 04/06/2019 1259   BUN 5 (L) 01/26/2019 1153   CREATININE 0.53 04/06/2019 1259   CREATININE 0.69 06/08/2016 1128      Component Value Date/Time   CALCIUM 8.6 (L) 04/06/2019 1259   ALKPHOS 118 04/06/2019 1259   AST 14 (L) 04/06/2019 1259   ALT 19 04/06/2019 1259   BILITOT <0.2 (L) 04/06/2019 1259       RADIOGRAPHIC STUDIES: Ct Angio Head W Or Wo Contrast  Result Date: 03/18/2019 CLINICAL DATA:  Initial evaluation for acute diplopia. EXAM: CT ANGIOGRAPHY HEAD AND NECK TECHNIQUE: Multidetector CT imaging of the head and neck was performed using the standard protocol during bolus administration of intravenous contrast. Multiplanar CT image reconstructions and MIPs were obtained to evaluate the vascular anatomy. Carotid stenosis measurements (when applicable) are obtained utilizing NASCET criteria, using the distal internal carotid diameter as the denominator. CONTRAST:  60mL OMNIPAQUE IOHEXOL 350 MG/ML SOLN COMPARISON:  Prior head CT from earlier the same day. FINDINGS: CTA NECK FINDINGS Aortic arch: Partially visualized aortic arch of normal  caliber. Origin of the right brachiocephalic and left common carotid arteries not visualized on this exam. Scattered atheromatous plaque within the visualized aortic arch and about the origin of the left subclavian artery without hemodynamically significant stenosis. Right carotid system: Eccentric calcified plaque about the origin of the right CCA with associated narrowing of up to approximately 40% by NASCET criteria. Right CCA mildly tortuous with scattered atheromatous plaque distally without high-grade stenosis. Bulky calcified plaque about the right bifurcation/proximal right ICA. Associated stenosis of up to approximately 50% about the origin of the right ICA distally, right ICA mildly tortuous with scattered atheromatous plaque without additional  high-grade stenosis, dissection, or occlusion. Left carotid system: Origin of the left CCA not visible on this exam. Scattered atheromatous plaque throughout the visualized left CCA with mild multifocal narrowing. Bulky mixed plaque about the left bifurcation/proximal left ICA with associated stenosis of up to 50% by NASCET criteria. Additional atheromatous plaque within the distal left ICA with associated focal stenosis of up to 65% by NASCET criteria (series 7, image 148). Left ICA otherwise patent to the skull base. Vertebral arteries: Both vertebral arteries arise from the subclavian arteries. Right vertebral artery dominant. Mild scattered atheromatous plaque within the right V1 and V2 segments with associated mild multifocal stenosis. Right vertebral otherwise widely patent to the skull base. Hypoplastic left vertebral artery occluded at its origin, and remains largely occluded within the neck. Distal reconstitution via muscular branches at the left V3 segment (series 7, image 163). Attenuated irregular flow seen within the hypoplastic left vertebral artery as it crosses into the cranial vault. Skeleton: Advanced compression deformity partially seen involving the T5 vertebral body with at least 80% height loss, age indeterminate, but new relative to recent CT from 01/16/2019. Irregular sclerotic lesion within the T1 vertebral body stable as compared to multiple previous exams, and presumably benign. No other discrete lytic or blastic osseous lesions. Moderate multilevel facet arthrosis noted within the cervical spine, left slightly worse than right. Degenerative changes noted about the TMJs bilaterally. Other neck: No other acute soft tissue abnormality within the neck. Prominent 1 cm left no noted adjacent to the left lobe of thyroid (series 5, image 31). Upper chest: Visualized upper chest demonstrates no acute finding. Upper lobe predominant centrilobular emphysema. Right-sided Port-A-Cath noted. Review of  the MIP images confirms the above findings CTA HEAD FINDINGS Anterior circulation: Petrous right ICA widely patent. Focal atheromatous plaque within the petrous left ICA with associated short-segment mild stenosis. Extensive atheromatous plaque within the cavernous/supraclinoid ICAs with resultant moderate to severe diffuse stenosis (estimated 50-75% bilaterally, left worse than right). ICA termini well perfused. Right A1 patent. A hypoplastic left A1 appears occluded. Normal anterior communicating artery. Anterior cerebral arteries patent to their distal aspects without stenosis or occlusion. No M1 stenosis or occlusion. Normal MCA bifurcations. Distal MCA branches well perfused and symmetric. Posterior circulation: Focal plaque within the dominant right vertebral artery as it crosses into the cranial vault without significant stenosis. Dominant right V4 segment otherwise patent to the vertebrobasilar junction. Patent right PICA. Hypoplastic left vertebral artery attenuated and irregular. Superimposed short-segment moderate to severe proximal left V4 stenosis prior to the takeoff of the left PICA (series 7, image 151). Additional short-segment moderate stenosis within the distal left V4 segment just prior to the vertebrobasilar junction. Left PICA patent. Basilar irregular but widely patent to its distal aspect. Superior cerebral arteries patent bilaterally. Both of the posterior cerebral arteries primarily supplied via the basilar. PCAs demonstrate scattered multifocal atheromatous  irregularity but are well perfused to their distal aspects without high-grade stenosis. Venous sinuses: Patent. Anatomic variants: None significant. Review of the MIP images confirms the above findings IMPRESSION: 1. Negative CTA for emergent large vessel occlusion. 2. Bulky atheromatous plaque about the origins of the internal carotid arteries with associated stenoses of up to 50% by NASCET criteria bilaterally. Additional  short-segment 50% stenosis involving the distal left ICA as above. 3. Heavy atherosclerotic change throughout the carotid siphons with associated moderate to severe multifocal stenoses, left worse than right. 4. 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 Superimposed moderate to severe multifocal left V4 stenoses as above. 5. Hypoplastic and occluded left A1 segment. Anterior cerebral arteries supplied via the right carotid artery system. 6. Severe compression deformity involving the T5 vertebral body, age indeterminate, but new relative to most recent chest CT from 01/16/2019. Correlation with history and physical exam recommended. 7. Ill-defined sclerotic lesion within the T1 vertebral body, indeterminate, but stable relative to multiple previous exams. 8. Emphysema. Critical Value/emergent results were called by telephone at the time of interpretation on 03/18/2019 at 8:09 pm to Dr. Gareth Morgan , who verbally acknowledged these results. Electronically Signed   By: Jeannine Boga M.D.   On: 03/18/2019 20:09   Dg Chest 2 View  Result Date: 03/28/2019 CLINICAL DATA:  Chest pain EXAM: CHEST - 2 VIEW COMPARISON:  01/21/2019 chest radiograph. FINDINGS: Right internal jugular Port-A-Cath terminates at the cavoatrial junction. Stable cardiomediastinal silhouette with normal heart size. No pneumothorax. No pleural effusion. Hyperinflated lungs. No pulmonary edema. No acute consolidative airspace disease. IMPRESSION: No acute cardiopulmonary disease.  Hyperinflated lungs suggest COPD. Electronically Signed   By: Ilona Sorrel M.D.   On: 03/28/2019 05:26   Ct Angio Neck W And/or Wo Contrast  Result Date: 03/18/2019 CLINICAL DATA:  Initial evaluation for acute diplopia. EXAM: CT ANGIOGRAPHY HEAD AND NECK TECHNIQUE: Multidetector CT imaging of the head and neck was performed using the standard protocol during bolus administration of intravenous  contrast. Multiplanar CT image reconstructions and MIPs were obtained to evaluate the vascular anatomy. Carotid stenosis measurements (when applicable) are obtained utilizing NASCET criteria, using the distal internal carotid diameter as the denominator. CONTRAST:  64mL OMNIPAQUE IOHEXOL 350 MG/ML SOLN COMPARISON:  Prior head CT from earlier the same day. FINDINGS: CTA NECK FINDINGS Aortic arch: Partially visualized aortic arch of normal caliber. Origin of the right brachiocephalic and left common carotid arteries not visualized on this exam. Scattered atheromatous plaque within the visualized aortic arch and about the origin of the left subclavian artery without hemodynamically significant stenosis. Right carotid system: Eccentric calcified plaque about the origin of the right CCA with associated narrowing of up to approximately 40% by NASCET criteria. Right CCA mildly tortuous with scattered atheromatous plaque distally without high-grade stenosis. Bulky calcified plaque about the right bifurcation/proximal right ICA. Associated stenosis of up to approximately 50% about the origin of the right ICA distally, right ICA mildly tortuous with scattered atheromatous plaque without additional high-grade stenosis, dissection, or occlusion. Left carotid system: Origin of the left CCA not visible on this exam. Scattered atheromatous plaque throughout the visualized left CCA with mild multifocal narrowing. Bulky mixed plaque about the left bifurcation/proximal left ICA with associated stenosis of up to 50% by NASCET criteria. Additional atheromatous plaque within the distal left ICA with associated focal stenosis of up to 65% by NASCET criteria (series 7, image 148). Left ICA otherwise patent to the  skull base. Vertebral arteries: Both vertebral arteries arise from the subclavian arteries. Right vertebral artery dominant. Mild scattered atheromatous plaque within the right V1 and V2 segments with associated mild multifocal  stenosis. Right vertebral otherwise widely patent to the skull base. Hypoplastic left vertebral artery occluded at its origin, and remains largely occluded within the neck. Distal reconstitution via muscular branches at the left V3 segment (series 7, image 163). Attenuated irregular flow seen within the hypoplastic left vertebral artery as it crosses into the cranial vault. Skeleton: Advanced compression deformity partially seen involving the T5 vertebral body with at least 80% height loss, age indeterminate, but new relative to recent CT from 01/16/2019. Irregular sclerotic lesion within the T1 vertebral body stable as compared to multiple previous exams, and presumably benign. No other discrete lytic or blastic osseous lesions. Moderate multilevel facet arthrosis noted within the cervical spine, left slightly worse than right. Degenerative changes noted about the TMJs bilaterally. Other neck: No other acute soft tissue abnormality within the neck. Prominent 1 cm left no noted adjacent to the left lobe of thyroid (series 5, image 31). Upper chest: Visualized upper chest demonstrates no acute finding. Upper lobe predominant centrilobular emphysema. Right-sided Port-A-Cath noted. Review of the MIP images confirms the above findings CTA HEAD FINDINGS Anterior circulation: Petrous right ICA widely patent. Focal atheromatous plaque within the petrous left ICA with associated short-segment mild stenosis. Extensive atheromatous plaque within the cavernous/supraclinoid ICAs with resultant moderate to severe diffuse stenosis (estimated 50-75% bilaterally, left worse than right). ICA termini well perfused. Right A1 patent. A hypoplastic left A1 appears occluded. Normal anterior communicating artery. Anterior cerebral arteries patent to their distal aspects without stenosis or occlusion. No M1 stenosis or occlusion. Normal MCA bifurcations. Distal MCA branches well perfused and symmetric. Posterior circulation: Focal plaque  within the dominant right vertebral artery as it crosses into the cranial vault without significant stenosis. Dominant right V4 segment otherwise patent to the vertebrobasilar junction. Patent right PICA. Hypoplastic left vertebral artery attenuated and irregular. Superimposed short-segment moderate to severe proximal left V4 stenosis prior to the takeoff of the left PICA (series 7, image 151). Additional short-segment moderate stenosis within the distal left V4 segment just prior to the vertebrobasilar junction. Left PICA patent. Basilar irregular but widely patent to its distal aspect. Superior cerebral arteries patent bilaterally. Both of the posterior cerebral arteries primarily supplied via the basilar. PCAs demonstrate scattered multifocal atheromatous irregularity but are well perfused to their distal aspects without high-grade stenosis. Venous sinuses: Patent. Anatomic variants: None significant. Review of the MIP images confirms the above findings IMPRESSION: 1. Negative CTA for emergent large vessel occlusion. 2. Bulky atheromatous plaque about the origins of the internal carotid arteries with associated stenoses of up to 50% by NASCET criteria bilaterally. Additional short-segment 50% stenosis involving the distal left ICA as above. 3. Heavy atherosclerotic change throughout the carotid siphons with associated moderate to severe multifocal stenoses, left worse than right. 4. 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 Superimposed moderate to severe multifocal left V4 stenoses as above. 5. Hypoplastic and occluded left A1 segment. Anterior cerebral arteries supplied via the right carotid artery system. 6. Severe compression deformity involving the T5 vertebral body, age indeterminate, but new relative to most recent chest CT from 01/16/2019. Correlation with history and physical exam recommended. 7. Ill-defined sclerotic lesion within the  T1 vertebral body, indeterminate, but stable relative to multiple previous exams. 8. Emphysema. Critical Value/emergent results  were called by telephone at the time of interpretation on 03/18/2019 at 8:09 pm to Dr. Gareth Morgan , who verbally acknowledged these results. Electronically Signed   By: Jeannine Boga M.D.   On: 03/18/2019 20:09   Mr Jeri Cos FB Contrast  Result Date: 03/19/2019 CLINICAL DATA:  Initial evaluation for acute visual changes, diplopia. EXAM: MRI HEAD WITHOUT AND WITH CONTRAST TECHNIQUE: Multiplanar, multiecho pulse sequences of the brain and surrounding structures were obtained without and with intravenous contrast. CONTRAST:  5 cc of Gadavist. COMPARISON:  Prior CT and CTA from earlier same day as well as previous brain MRI from 02/13/2019. FINDINGS: Brain: Examination severely degraded by motion artifact, limiting evaluation. 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 Underlying age-related cerebral atrophy with mild chronic microvascular ischemic disease. 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 No abnormal foci of restricted diffusion to suggest acute or subacute ischemia on today's exam. Previously seen focus of diffusion abnormality with associated ring enhancement within the left occipital lobe is not seen on today's exam, and appears to have resolved, suggesting that this finding represented a subacute infarct on prior study. No residual FLAIR signal abnormality now seen within this region. No other mass lesion or definite abnormal enhancement on today's exam. No midline shift or mass effect. No evidence for acute intracranial hemorrhage. No hydrocephalus. No extra-axial fluid collection. Diffuse enlargement with heterogeneous enhancement of the pituitary gland again noted, grossly similar, but better evaluated on recent brain MRI given technical limitations due to motion on this exam. Vascular: Major  intracranial vascular flow voids are maintained. Skull and upper cervical spine: Craniocervical junction grossly within normal limits. No definite focal marrow replacing lesion on this motion degraded exam. Scalp soft tissues demonstrate no acute finding. Sinuses/Orbits: Patient status post bilateral ocular lens replacement. Globes and orbital soft tissues demonstrate no acute finding. Paranasal sinuses are clear. No appreciable mastoid effusion. Inner ear structures grossly normal. Other: None. IMPRESSION: 1. Technically limited exam due to extensive motion artifact. 2. No acute intracranial infarct or other abnormality identified. 3. Apparent interval resolution of previously seen 6 mm ring-enhancing lesion within the left occipital lobe, which most likely reflected a subacute infarct on previous exam. No evidence for parenchymal mass or metastatic disease on today's study. Given the technical limitations and extensive motion artifact on this exam, a short interval follow-up study in 4-6 weeks to confirm these findings is suggested. 4. Grossly similar appearance of enlarged pituitary gland, better seen on previous MRI. 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 Attention at follow-up recommended. Electronically Signed   By: Jeannine Boga M.D.   On: 03/19/2019 00:32   Nm Pet Image Restag (ps) Skull Base To Thigh  Result Date: 03/26/2019 CLINICAL DATA:  The LEFT supraclavicular node may be amenable to ultrasound-guided percutaneous biopsy. Treatment strategy for small cell lung cancer. EXAM: NUCLEAR MEDICINE PET SKULL BASE TO THIGH TECHNIQUE: 4.9 mCi F-18 FDG was injected intravenously. Full-ring PET imaging was performed from the skull base to thigh after the radiotracer. CT data was obtained and used for attenuation correction and anatomic localization. Fasting blood glucose: 93 mg/dl COMPARISON:  PET-CT 06/24/2018 FINDINGS: Mediastinal blood pool activity: SUV max 1.6 Liver activity:  SUV max NA NECK: Hypermetabolic lymph node in the LEFT supraclavicular nodal station adjacent to the lower pole of the LEFT lobe of thyroid gland measures 1.2 cm with SUV max equal 4.7. (Image 47/4) Incidental CT findings: none CHEST: Resolution of the  hypermetabolic nodule in the LEFT upper lobe. New hypermetabolic middle mediastinal lymph nodes along the descending thoracic aorta . Lymph node LEFT of the descending aorta measures 7 mm with SUV max equal 7.9. Lymph node at the same level (image 97/4) on the RIGHT measures 1.3 cm SUV max equal 4.9. More inferior node on the RIGHT measures 12 mm (image 105 with) SUV max equal 3.0. Ill-defined nodule in the RIGHT upper lobe measuring 6 mm unchanged in size from prior. There is mild bronchial thickening adjacent to this nodule. There is mild metabolic activity SUV max equal 1.8 similar prior. Metabolic activity associated with a RIGHT upper lobe cystic lesion is decreased. Incidental CT findings: none ABDOMEN/PELVIS: New hypermetabolic lymph nodes along the iliac chains. Example cluster of nodes along the LEFT common iliac measuring 8 mm with SUV max equal 5.1 (image 148 fused data set). Similar nodes adjacent to the RIGHT common iliac with a 12 mm node (image 137/4) with SUV max equal 5.0. No hypermetabolic inguinal nodes. Similar lymph nodes LEFT aorta at the level of the kidneys SUV max equal 5.0. Hypermetabolic lymph nodes in the porta hepatis Incidental CT findings: none SKELETON: No focal hypermetabolic activity to suggest skeletal metastasis. Incidental CT findings: none IMPRESSION: 1. New hypermetabolic lymphadenopathy in the chest, abdomen, and pelvis concerning for lymphoma. 2. Hypermetabolic lymph nodes involve the posterior mediastinum periaortic nodal stations, upper abdominal periaortic nodal stations, periportal nodes and common iliac nodal stations. Additional solitary LEFT supraclavicular node. 3. LEFT supraclavicular node may be amenable to  ultrasound-guided percutaneous biopsy. 4. Resolution of hypermetabolic pulmonary nodule in the LEFT upper lobe. 5. Mild metabolic activity associated with a RIGHT upper lobe pulmonary nodule. Recommend attention on follow-up Electronically Signed   By: Suzy Bouchard M.D.   On: 03/26/2019 15:17   Ct Head Code Stroke Wo Contrast  Result Date: 03/18/2019 CLINICAL DATA:  Code stroke. Initial evaluation for acute visual changes, visual loss. EXAM: CT HEAD WITHOUT CONTRAST TECHNIQUE: Contiguous axial images were obtained from the base of the skull through the vertex without intravenous contrast. COMPARISON:  None. FINDINGS: Brain: Age-related cerebral atrophy with chronic microvascular ischemic disease. 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 No acute intracranial hemorrhage. No acute large vessel territory infarct. No visible mass lesion, mass effect, or midline shift. No hydrocephalus. No extra-axial fluid collection. Vascular: No hyperdense vessel. Scattered vascular calcifications noted within the carotid siphons. Skull: Scalp soft tissues demonstrate no acute finding. Calvarium intact. Sinuses/Orbits: Globes and orbital soft tissues demonstrate no acute finding. Patient status post bilateral ocular lens replacement. Paranasal sinuses are clear. No mastoid effusion. Other: None. ASPECTS [facility] Stroke Program Early CT Score) - Ganglionic level infarction (caudate, lentiform nuclei, internal capsule, insula, M1-M3 cortex): 7 - Supraganglionic infarction (M4-M6 cortex): 3 Total score (0-10 with 10 being normal): 10 IMPRESSION: 1. No acute intracranial infarct or other abnormality identified. 2. ASPECTS is 10. 3. Right cerebral heterotopia, grossly stable, better evaluated on recent brain MRI. 4. 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 No other visible mass lesion. Critical Value/emergent results were called by telephone at the time of interpretation on 03/18/2019 at 7:30 pm to Dr. Gareth Morgan , who verbally acknowledged these results. Electronically Signed   By: Jeannine Boga M.D.   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  The patient has been tolerating her treatment well with no concerning adverse effects. I discussed with her the results of the PET scan. I recommended for her to proceed with cycle #3 today as planned. I will see her back for follow-up visit in 3 weeks for evaluation before the next cycle of her treatment. She was advised to call immediately if she has any concerning symptoms in the interval. The patient voices understanding of current disease status and treatment options and is in agreement with the current care plan. All questions were answered. The patient knows to call the clinic with any problems, questions or concerns. We can certainly see the patient much sooner if necessary.  I spent 10 minutes counseling the patient face to face. The total time spent in the appointment was 15 minutes.  Disclaimer: This note was dictated with voice recognition software. Similar sounding words can inadvertently be transcribed and may not be corrected upon review.

## 2019-04-14 ENCOUNTER — Inpatient Hospital Stay: Payer: Medicare Other

## 2019-04-14 ENCOUNTER — Other Ambulatory Visit: Payer: Self-pay

## 2019-04-14 VITALS — BP 107/64 | HR 109 | Temp 98.0°F | Resp 18

## 2019-04-14 DIAGNOSIS — Z5112 Encounter for antineoplastic immunotherapy: Secondary | ICD-10-CM | POA: Diagnosis not present

## 2019-04-14 DIAGNOSIS — C3431 Malignant neoplasm of lower lobe, right bronchus or lung: Secondary | ICD-10-CM

## 2019-04-14 MED ORDER — DEXAMETHASONE SODIUM PHOSPHATE 10 MG/ML IJ SOLN
10.0000 mg | Freq: Once | INTRAMUSCULAR | Status: AC
  Administered 2019-04-14: 10 mg via INTRAVENOUS

## 2019-04-14 MED ORDER — SODIUM CHLORIDE 0.9 % IV SOLN
100.0000 mg/m2 | Freq: Once | INTRAVENOUS | Status: AC
  Administered 2019-04-14: 140 mg via INTRAVENOUS
  Filled 2019-04-14: qty 7

## 2019-04-14 MED ORDER — SODIUM CHLORIDE 0.9 % IV SOLN
Freq: Once | INTRAVENOUS | Status: AC
  Administered 2019-04-14: 15:00:00 via INTRAVENOUS
  Filled 2019-04-14: qty 250

## 2019-04-14 MED ORDER — HEPARIN SOD (PORK) LOCK FLUSH 100 UNIT/ML IV SOLN
500.0000 [IU] | Freq: Once | INTRAVENOUS | Status: AC | PRN
  Administered 2019-04-14: 500 [IU]
  Filled 2019-04-14: qty 5

## 2019-04-14 MED ORDER — DEXAMETHASONE SODIUM PHOSPHATE 10 MG/ML IJ SOLN
INTRAMUSCULAR | Status: AC
  Filled 2019-04-14: qty 1

## 2019-04-14 MED ORDER — SODIUM CHLORIDE 0.9% FLUSH
10.0000 mL | INTRAVENOUS | Status: DC | PRN
  Administered 2019-04-14: 17:00:00 10 mL
  Filled 2019-04-14: qty 10

## 2019-04-14 NOTE — Progress Notes (Signed)
Nutrition Assessment:  Patient identified on Malnutrition Screening report for weight loss.  73 year old female with lung cancer.  Patient is receiving carboplatin, etoposide and imfinzi q 3 weeks.  Past medical history of COPD, HLD, HTN.    Met with patient during infusion today.  Patient short of breath during visit.  Reports that usually during the week of chemotherapy her appetite goes down.  Reports that she likes ensure (unsure which one she has) and drinks 3-4 per day and V-8 juice.      Medications: MVI, compazine  Labs: Na 130  Anthropometrics:   Height: 64 inches Weight: 88 lb 4.8 oz 8/24 UBW: 108 lb per patient, noted in March 2020 BMI: 15  19% weight loss in the last 5 months, significant   Estimated Energy Needs  Kcals: 1200-1400  Protein: 60-70 g Fluid: 1.4 L  NUTRITION DIAGNOSIS: Inadequate oral intake related to cancer and cancer related treatment side effects as evidenced by 19% weight loss in the last 5 months   INTERVENTION:  Discussed strategies to increase calories and protein.  Fact sheet provided Encouraged patient to purchase 350 calorie oral nutrition supplement and examples provided Contact information provided    MONITORING, EVALUATION, GOAL: Patient will consume adequate calories and protein to prevent further weight loss   NEXT VISIT: Sept 15 during infusion  Janique Hoefer B. Zenia Resides, [place], [place] Registered Dietitian [phone] (pager)

## 2019-04-14 NOTE — Patient Instructions (Signed)
[place] Discharge Instructions for Patients Receiving Chemotherapy  Today you received the following chemotherapy agents: Etoposide.  To help prevent nausea and vomiting after your treatment, we encourage you to take your nausea medication as prescribed.  TAKE COMPAZINE 10 MG BY MOUTH EVERY 6 HOURS AS NEEDED FOR NAUSEA. DO NOT DRIVE AFTER TAKING THIS MEDICATION AS IT CAN CAUSE DROWSINESS.  DO DRINK LOTS OF FLUIDS AS TOLERATED.  DO NOT EAT GREASY NOR SPICY FOODS.   If you develop nausea and vomiting that is not controlled by your nausea medication, call the clinic.   BELOW ARE SYMPTOMS THAT SHOULD BE REPORTED IMMEDIATELY:  *FEVER GREATER THAN 100.5 F  *CHILLS WITH OR WITHOUT FEVER  NAUSEA AND VOMITING THAT IS NOT CONTROLLED WITH YOUR NAUSEA MEDICATION  *UNUSUAL SHORTNESS OF BREATH  *UNUSUAL BRUISING OR BLEEDING  TENDERNESS IN MOUTH AND THROAT WITH OR WITHOUT PRESENCE OF ULCERS  *URINARY PROBLEMS  *BOWEL PROBLEMS  UNUSUAL RASH Items with * indicate a potential emergency and should be followed up as soon as possible.  Feel free to call the clinic should you have any questions or concerns. The clinic phone number is [phone].  Please show the [place] at check-in to the Emergency Department and triage nurse.

## 2019-04-14 NOTE — Progress Notes (Signed)
Ok to tx w/ pulse 109 per Dr. Earlie Server

## 2019-04-15 ENCOUNTER — Inpatient Hospital Stay: Payer: Medicare Other

## 2019-04-15 ENCOUNTER — Encounter: Payer: Self-pay | Admitting: Internal Medicine

## 2019-04-15 ENCOUNTER — Other Ambulatory Visit: Payer: Self-pay

## 2019-04-15 VITALS — BP 154/71 | HR 95 | Temp 98.0°F | Resp 18

## 2019-04-15 DIAGNOSIS — C3431 Malignant neoplasm of lower lobe, right bronchus or lung: Secondary | ICD-10-CM

## 2019-04-15 DIAGNOSIS — Z5112 Encounter for antineoplastic immunotherapy: Secondary | ICD-10-CM | POA: Diagnosis not present

## 2019-04-15 MED ORDER — HEPARIN SOD (PORK) LOCK FLUSH 100 UNIT/ML IV SOLN
500.0000 [IU] | Freq: Once | INTRAVENOUS | Status: AC | PRN
  Administered 2019-04-15: 500 [IU]
  Filled 2019-04-15: qty 5

## 2019-04-15 MED ORDER — DEXAMETHASONE SODIUM PHOSPHATE 10 MG/ML IJ SOLN
10.0000 mg | Freq: Once | INTRAMUSCULAR | Status: AC
  Administered 2019-04-15: 10 mg via INTRAVENOUS

## 2019-04-15 MED ORDER — SODIUM CHLORIDE 0.9 % IV SOLN
Freq: Once | INTRAVENOUS | Status: AC
  Administered 2019-04-15: 14:00:00 via INTRAVENOUS
  Filled 2019-04-15: qty 250

## 2019-04-15 MED ORDER — DEXAMETHASONE SODIUM PHOSPHATE 10 MG/ML IJ SOLN
INTRAMUSCULAR | Status: AC
  Filled 2019-04-15: qty 1

## 2019-04-15 MED ORDER — SODIUM CHLORIDE 0.9% FLUSH
10.0000 mL | INTRAVENOUS | Status: DC | PRN
  Administered 2019-04-15: 10 mL
  Filled 2019-04-15: qty 10

## 2019-04-15 MED ORDER — SODIUM CHLORIDE 0.9 % IV SOLN
100.0000 mg/m2 | Freq: Once | INTRAVENOUS | Status: AC
  Administered 2019-04-15: 140 mg via INTRAVENOUS
  Filled 2019-04-15: qty 7

## 2019-04-15 NOTE — Progress Notes (Signed)
Met with patient at registration to introduce myself as Financial Resource Specialist and to offer available resources. ° °Discussed one-time $700 [facility] grant and qualifications to assist with personal expenses while going through treatment. ° °Gave her my card if interested in applying and for any additional financial questions or concerns. She verbalized understanding.   °

## 2019-04-15 NOTE — Patient Instructions (Signed)
[facility] Discharge Instructions for Patients Receiving Chemotherapy  Today you received the following chemotherapy agents Etoposide (VEPESID).  To help prevent nausea and vomiting after your treatment, we encourage you to take your nausea medication as prescribed.  If you develop nausea and vomiting that is not controlled by your nausea medication, call the clinic.   BELOW ARE SYMPTOMS THAT SHOULD BE REPORTED IMMEDIATELY:  *FEVER GREATER THAN 100.5 F  *CHILLS WITH OR WITHOUT FEVER  NAUSEA AND VOMITING THAT IS NOT CONTROLLED WITH YOUR NAUSEA MEDICATION  *UNUSUAL SHORTNESS OF BREATH  *UNUSUAL BRUISING OR BLEEDING  TENDERNESS IN MOUTH AND THROAT WITH OR WITHOUT PRESENCE OF ULCERS  *URINARY PROBLEMS  *BOWEL PROBLEMS  UNUSUAL RASH Items with * indicate a potential emergency and should be followed up as soon as possible.  Feel free to call the clinic should you have any questions or concerns. The clinic phone number is [phone].  Please show the [place] at check-in to the Emergency Department and triage nurse.  Coronavirus (COVID-19) Are you at risk?  Are you at risk for the Coronavirus (COVID-19)?  To be considered HIGH RISK for Coronavirus (COVID-19), you have to meet the following criteria:  . Traveled to [place], [place], [place], [place] or [place]; or in the [place] to [place], [place], [place], or [place]; and have fever, cough, and shortness of breath within the last 2 weeks of travel OR . Been in close contact with a person diagnosed with COVID-19 within the last 2 weeks and have fever, cough, and shortness of breath . IF YOU DO NOT MEET THESE CRITERIA, YOU ARE CONSIDERED LOW RISK FOR COVID-19.  What to do if you are HIGH RISK for COVID-19?  Marland Kitchen If you are having a medical emergency, call 911. . Seek medical care right away. Before you go to a doctor's office, urgent care or emergency department, call ahead and tell them  about your recent travel, contact with someone diagnosed with COVID-19, and your symptoms. You should receive instructions from your physician's office regarding next steps of care.  . When you arrive at healthcare provider, tell the healthcare staff immediately you have returned from visiting [place], [place], [place], [place] or [place]; or traveled in the [place] to [place], [place], [place], or [place]; in the last two weeks or you have been in close contact with a person diagnosed with COVID-19 in the last 2 weeks.   . Tell the health care staff about your symptoms: fever, cough and shortness of breath. . After you have been seen by a medical provider, you will be either: o Tested for (COVID-19) and discharged home on quarantine except to seek medical care if symptoms worsen, and asked to  - Stay home and avoid contact with others until you get your results (4-5 days)  - Avoid travel on public transportation if possible (such as bus, train, or airplane) or o Sent to the Emergency Department by EMS for evaluation, COVID-19 testing, and possible admission depending on your condition and test results.  What to do if you are LOW RISK for COVID-19?  Reduce your risk of any infection by using the same precautions used for avoiding the common cold or flu:  Marland Kitchen Wash your hands often with soap and warm water for at least 20 seconds.  If soap and water are not readily available, use an alcohol-based hand sanitizer with at least 60% alcohol.  . If coughing or sneezing,  cover your mouth and nose by coughing or sneezing into the elbow areas of your shirt or coat, into a tissue or into your sleeve (not your hands). . Avoid shaking hands with others and consider head nods or verbal greetings only. . Avoid touching your eyes, nose, or mouth with unwashed hands.  . Avoid close contact with people who are sick. . Avoid places or events with large numbers of people in one location, like concerts or  sporting events. . Carefully consider travel plans you have or are making. . If you are planning any travel outside or inside the [place], visit the CDC's Travelers' Health webpage for the latest health notices. . If you have some symptoms but not all symptoms, continue to monitor at home and seek medical attention if your symptoms worsen. . If you are having a medical emergency, call 911.   [place] / e-Visit: [email or web address]         [facility]: [facility]: [phone]                   [facility]: [phone]

## 2019-04-16 ENCOUNTER — Telehealth: Payer: Medicare Other | Admitting: Family Medicine

## 2019-04-16 ENCOUNTER — Telehealth (INDEPENDENT_AMBULATORY_CARE_PROVIDER_SITE_OTHER): Payer: Medicare Other | Admitting: Family Medicine

## 2019-04-16 DIAGNOSIS — G4700 Insomnia, unspecified: Secondary | ICD-10-CM | POA: Insufficient documentation

## 2019-04-16 MED ORDER — TRAZODONE HCL 50 MG PO TABS: 50.0000 mg | ORAL_TABLET | Freq: Every evening | ORAL | 0 refills | Status: DC | PRN

## 2019-04-16 NOTE — Assessment & Plan Note (Addendum)
Acute insomnia. Frequent steroid with chemotherapy likely contributing to sleep problems. Sleep hygiene issues reviewed and appropriate management discussed including avoiding screens and caffeine sources, get out of bed if unable to fall asleep, avoid daytime naps. The use of sedative hypnotics for temporary relief is appropriate; we discussed the addictive nature of these drugs, a prescription for prn use of a hypnotic was given. Goal is for temporary use only. - trial of Trazodone 50mg  qHS PRN for sleep - Recommended RTC in 2-3 weeks for follow up with PCP if no improvement for further evaluation and sleep hygiene counseling.

## 2019-04-16 NOTE — Progress Notes (Signed)
[place] Telemedicine Visit  Patient consented to have virtual visit. Method of visit: Telephone  Encounter participants: Patient: Pamela Russell - located at home Provider: Danna Hefty - located at [facility] Others (if applicable): None  Chief Complaint: "Cant Sleep"  HPI: Insomnia: Notes she is having issues with sleeping since starting her chemotherapy for Small cell lung cancer. She notes she has trouble falling asleep and staying asleep. Notes she cant sleep without the tv on. Denies any cell phone use. She notes she will only sleep for 15 minutes before waking up. Denies any depression or anxiety symptoms. She notes that she takes 3-4 naps throughout the day, which last about 10-15 mins each. She does not drink caffeine. She has tried OTC melatonin which has not helped.    ROS: per HPI  Pertinent PMHx: Small cell lung cancer actively receiving systemic chemotherapy and dexamethasone at time of treatment  Exam:  Gen: pleasant older lady Respiratory: Speaking in full sentences, breathing comfortably on room air  Assessment/Plan: Insomnia Acute insomnia. Frequent steroid with chemotherapy likely contributing to sleep problems. Sleep hygiene issues reviewed and appropriate management discussed including avoiding screens and caffeine sources, get out of bed if unable to fall asleep, avoid daytime naps. The use of sedative hypnotics for temporary relief is appropriate; we discussed the addictive nature of these drugs, a prescription for prn use of a hypnotic was given. Goal is for temporary use only. - trial of Trazodone 50mg  qHS PRN for sleep - Recommended RTC in 2-3 weeks for follow up with PCP if no improvement for further evaluation and sleep hygiene counseling.    Plan discussed with preceptor Dr. Sheppard Coil.  Time spent during visit with patient: 21 minutes  Mina Marble, [place], PGY2 04/16/2019

## 2019-04-17 ENCOUNTER — Inpatient Hospital Stay: Payer: Medicare Other

## 2019-04-17 ENCOUNTER — Other Ambulatory Visit: Payer: Self-pay

## 2019-04-17 VITALS — BP 155/85 | HR 106 | Temp 98.3°F | Resp 18

## 2019-04-17 DIAGNOSIS — Z5112 Encounter for antineoplastic immunotherapy: Secondary | ICD-10-CM | POA: Diagnosis not present

## 2019-04-17 DIAGNOSIS — C3431 Malignant neoplasm of lower lobe, right bronchus or lung: Secondary | ICD-10-CM

## 2019-04-17 MED ORDER — PEGFILGRASTIM-CBQV 6 MG/0.6ML ~~LOC~~ SOSY
6.0000 mg | PREFILLED_SYRINGE | Freq: Once | SUBCUTANEOUS | Status: AC
  Administered 2019-04-17: 6 mg via SUBCUTANEOUS

## 2019-04-17 MED ORDER — PEGFILGRASTIM-CBQV 6 MG/0.6ML ~~LOC~~ SOSY
PREFILLED_SYRINGE | SUBCUTANEOUS | Status: AC
  Filled 2019-04-17: qty 0.6

## 2019-04-17 NOTE — Patient Instructions (Signed)

## 2019-04-20 ENCOUNTER — Telehealth: Payer: Self-pay | Admitting: Internal Medicine

## 2019-04-20 ENCOUNTER — Inpatient Hospital Stay: Payer: Medicare Other

## 2019-04-20 NOTE — Telephone Encounter (Signed)
Returned call to patient re rescheduling 8/31 weekly lab. Per patient moved 8/31 lab to 9/2 due to she is feeling nauseous.

## 2019-04-21 ENCOUNTER — Other Ambulatory Visit: Payer: Self-pay | Admitting: Internal Medicine

## 2019-04-22 ENCOUNTER — Inpatient Hospital Stay: Payer: Medicare Other | Attending: Internal Medicine

## 2019-04-22 ENCOUNTER — Telehealth: Payer: Self-pay | Admitting: Medical Oncology

## 2019-04-22 ENCOUNTER — Other Ambulatory Visit: Payer: Self-pay

## 2019-04-22 DIAGNOSIS — Z90722 Acquired absence of ovaries, bilateral: Secondary | ICD-10-CM | POA: Insufficient documentation

## 2019-04-22 DIAGNOSIS — Z90710 Acquired absence of both cervix and uterus: Secondary | ICD-10-CM | POA: Diagnosis not present

## 2019-04-22 DIAGNOSIS — Z79899 Other long term (current) drug therapy: Secondary | ICD-10-CM | POA: Diagnosis not present

## 2019-04-22 DIAGNOSIS — Z9079 Acquired absence of other genital organ(s): Secondary | ICD-10-CM | POA: Insufficient documentation

## 2019-04-22 DIAGNOSIS — Z5112 Encounter for antineoplastic immunotherapy: Secondary | ICD-10-CM | POA: Insufficient documentation

## 2019-04-22 DIAGNOSIS — Z7982 Long term (current) use of aspirin: Secondary | ICD-10-CM | POA: Diagnosis not present

## 2019-04-22 DIAGNOSIS — J449 Chronic obstructive pulmonary disease, unspecified: Secondary | ICD-10-CM | POA: Insufficient documentation

## 2019-04-22 DIAGNOSIS — I10 Essential (primary) hypertension: Secondary | ICD-10-CM | POA: Diagnosis not present

## 2019-04-22 DIAGNOSIS — Z5111 Encounter for antineoplastic chemotherapy: Secondary | ICD-10-CM | POA: Diagnosis present

## 2019-04-22 DIAGNOSIS — C3411 Malignant neoplasm of upper lobe, right bronchus or lung: Secondary | ICD-10-CM | POA: Diagnosis present

## 2019-04-22 DIAGNOSIS — Z791 Long term (current) use of non-steroidal anti-inflammatories (NSAID): Secondary | ICD-10-CM | POA: Diagnosis not present

## 2019-04-22 DIAGNOSIS — C3431 Malignant neoplasm of lower lobe, right bronchus or lung: Secondary | ICD-10-CM

## 2019-04-22 DIAGNOSIS — Z5189 Encounter for other specified aftercare: Secondary | ICD-10-CM | POA: Insufficient documentation

## 2019-04-22 DIAGNOSIS — Z23 Encounter for immunization: Secondary | ICD-10-CM | POA: Insufficient documentation

## 2019-04-22 DIAGNOSIS — C3412 Malignant neoplasm of upper lobe, left bronchus or lung: Secondary | ICD-10-CM | POA: Diagnosis not present

## 2019-04-22 DIAGNOSIS — Z7951 Long term (current) use of inhaled steroids: Secondary | ICD-10-CM | POA: Insufficient documentation

## 2019-04-22 LAB — CMP (CANCER CENTER ONLY)
ALT: 13 U/L (ref 0–44)
AST: 10 U/L — ABNORMAL LOW (ref 15–41)
Albumin: 3.6 g/dL (ref 3.5–5.0)
Alkaline Phosphatase: 160 U/L — ABNORMAL HIGH (ref 38–126)
Anion gap: 9 (ref 5–15)
BUN: 7 mg/dL — ABNORMAL LOW (ref 8–23)
CO2: 24 mmol/L (ref 22–32)
Calcium: 8.9 mg/dL (ref 8.9–10.3)
Chloride: 101 mmol/L (ref 98–111)
Creatinine: 0.52 mg/dL (ref 0.44–1.00)
GFR, Est AFR Am: >60 mL/min (ref >60)
GFR, Estimated: >60 mL/min (ref >60)
Glucose, Bld: 93 mg/dL (ref 70–99)
Potassium: 3.7 mmol/L (ref 3.5–5.1)
Sodium: 134 mmol/L — ABNORMAL LOW (ref 135–145)
Total Bilirubin: <0.2 mg/dL — ABNORMAL LOW (ref 0.3–1.2)
Total Protein: 6.1 g/dL — ABNORMAL LOW (ref 6.5–8.1)

## 2019-04-22 LAB — CBC WITH DIFFERENTIAL (CANCER CENTER ONLY)
Abs Immature Granulocytes: 0.66 10*3/uL — ABNORMAL HIGH (ref 0.00–0.07)
Basophils Absolute: 0.2 10*3/uL — ABNORMAL HIGH (ref 0.0–0.1)
Basophils Relative: 1 %
Eosinophils Absolute: 0 10*3/uL (ref 0.0–0.5)
Eosinophils Relative: 0 %
HCT: 28.6 % — ABNORMAL LOW (ref 36.0–46.0)
Hemoglobin: 9.6 g/dL — ABNORMAL LOW (ref 12.0–15.0)
Immature Granulocytes: 5 %
Lymphocytes Relative: 11 %
Lymphs Abs: 1.5 10*3/uL (ref 0.7–4.0)
MCH: 31.9 pg (ref 26.0–34.0)
MCHC: 33.6 g/dL (ref 30.0–36.0)
MCV: 95 fL (ref 80.0–100.0)
Monocytes Absolute: 1.5 10*3/uL — ABNORMAL HIGH (ref 0.1–1.0)
Monocytes Relative: 11 %
Neutro Abs: 10.3 10*3/uL — ABNORMAL HIGH (ref 1.7–7.7)
Neutrophils Relative %: 72 %
Platelet Count: 318 10*3/uL (ref 150–400)
RBC: 3.01 MIL/uL — ABNORMAL LOW (ref 3.87–5.11)
RDW: 15.9 % — ABNORMAL HIGH (ref 11.5–15.5)
WBC Count: 14 10*3/uL — ABNORMAL HIGH (ref 4.0–10.5)
nRBC: 0 % (ref 0.0–0.2)

## 2019-04-22 NOTE — Telephone Encounter (Signed)
Told pt CMP is not resulted.

## 2019-04-28 ENCOUNTER — Inpatient Hospital Stay: Payer: Medicare Other

## 2019-04-28 ENCOUNTER — Other Ambulatory Visit: Payer: Self-pay

## 2019-04-28 DIAGNOSIS — C3412 Malignant neoplasm of upper lobe, left bronchus or lung: Secondary | ICD-10-CM | POA: Diagnosis not present

## 2019-04-28 DIAGNOSIS — C3431 Malignant neoplasm of lower lobe, right bronchus or lung: Secondary | ICD-10-CM

## 2019-04-28 LAB — CMP (CANCER CENTER ONLY)
ALT: 12 U/L (ref 0–44)
AST: 14 U/L — ABNORMAL LOW (ref 15–41)
Albumin: 3.9 g/dL (ref 3.5–5.0)
Alkaline Phosphatase: 170 U/L — ABNORMAL HIGH (ref 38–126)
Anion gap: 10 (ref 5–15)
BUN: 5 mg/dL — ABNORMAL LOW (ref 8–23)
CO2: 24 mmol/L (ref 22–32)
Calcium: 9.1 mg/dL (ref 8.9–10.3)
Chloride: 97 mmol/L — ABNORMAL LOW (ref 98–111)
Creatinine: 0.57 mg/dL (ref 0.44–1.00)
GFR, Est AFR Am: >60 mL/min (ref >60)
GFR, Estimated: >60 mL/min (ref >60)
Glucose, Bld: 100 mg/dL — ABNORMAL HIGH (ref 70–99)
Potassium: 4.6 mmol/L (ref 3.5–5.1)
Sodium: 131 mmol/L — ABNORMAL LOW (ref 135–145)
Total Bilirubin: <0.2 mg/dL — ABNORMAL LOW (ref 0.3–1.2)
Total Protein: 6.4 g/dL — ABNORMAL LOW (ref 6.5–8.1)

## 2019-04-28 LAB — CBC WITH DIFFERENTIAL (CANCER CENTER ONLY)
Abs Immature Granulocytes: 0.6 10*3/uL — ABNORMAL HIGH (ref 0.00–0.07)
Band Neutrophils: 13 %
Basophils Absolute: 0 10*3/uL (ref 0.0–0.1)
Basophils Relative: 0 %
Eosinophils Absolute: 0.3 10*3/uL (ref 0.0–0.5)
Eosinophils Relative: 1 %
HCT: 32.7 % — ABNORMAL LOW (ref 36.0–46.0)
Hemoglobin: 11.2 g/dL — ABNORMAL LOW (ref 12.0–15.0)
Lymphocytes Relative: 13 %
Lymphs Abs: 3.6 10*3/uL (ref 0.7–4.0)
MCH: 32.6 pg (ref 26.0–34.0)
MCHC: 34.3 g/dL (ref 30.0–36.0)
MCV: 95.1 fL (ref 80.0–100.0)
Metamyelocytes Relative: 2 %
Monocytes Absolute: 2.2 10*3/uL — ABNORMAL HIGH (ref 0.1–1.0)
Monocytes Relative: 8 %
Neutro Abs: 20.9 10*3/uL — ABNORMAL HIGH (ref 1.7–17.7)
Neutrophils Relative %: 63 %
Platelet Count: 258 10*3/uL (ref 150–400)
RBC: 3.44 MIL/uL — ABNORMAL LOW (ref 3.87–5.11)
RDW: 16.8 % — ABNORMAL HIGH (ref 11.5–15.5)
WBC Count: 27.5 10*3/uL — ABNORMAL HIGH (ref 4.0–10.5)
nRBC: 0.2 % (ref 0.0–0.2)

## 2019-04-28 LAB — TSH: TSH: 2.144 u[IU]/mL (ref 0.308–3.960)

## 2019-05-04 ENCOUNTER — Inpatient Hospital Stay: Payer: Medicare Other

## 2019-05-04 ENCOUNTER — Inpatient Hospital Stay (HOSPITAL_BASED_OUTPATIENT_CLINIC_OR_DEPARTMENT_OTHER): Payer: Medicare Other | Admitting: Internal Medicine

## 2019-05-04 ENCOUNTER — Encounter: Payer: Self-pay | Admitting: Internal Medicine

## 2019-05-04 ENCOUNTER — Other Ambulatory Visit: Payer: Self-pay

## 2019-05-04 VITALS — BP 154/70 | HR 79 | Temp 98.3°F | Resp 18 | Ht 64.0 in | Wt 87.2 lb

## 2019-05-04 DIAGNOSIS — Z5112 Encounter for antineoplastic immunotherapy: Secondary | ICD-10-CM

## 2019-05-04 DIAGNOSIS — C3411 Malignant neoplasm of upper lobe, right bronchus or lung: Secondary | ICD-10-CM

## 2019-05-04 DIAGNOSIS — C3412 Malignant neoplasm of upper lobe, left bronchus or lung: Secondary | ICD-10-CM | POA: Diagnosis not present

## 2019-05-04 DIAGNOSIS — C3431 Malignant neoplasm of lower lobe, right bronchus or lung: Secondary | ICD-10-CM

## 2019-05-04 DIAGNOSIS — C7931 Secondary malignant neoplasm of brain: Secondary | ICD-10-CM

## 2019-05-04 DIAGNOSIS — Z95828 Presence of other vascular implants and grafts: Secondary | ICD-10-CM

## 2019-05-04 DIAGNOSIS — Z5111 Encounter for antineoplastic chemotherapy: Secondary | ICD-10-CM

## 2019-05-04 LAB — CMP (CANCER CENTER ONLY)
ALT: 8 U/L (ref 0–44)
AST: 11 U/L — ABNORMAL LOW (ref 15–41)
Albumin: 3.8 g/dL (ref 3.5–5.0)
Alkaline Phosphatase: 120 U/L (ref 38–126)
Anion gap: 8 (ref 5–15)
BUN: 6 mg/dL — ABNORMAL LOW (ref 8–23)
CO2: 25 mmol/L (ref 22–32)
Calcium: 8.9 mg/dL (ref 8.9–10.3)
Chloride: 97 mmol/L — ABNORMAL LOW (ref 98–111)
Creatinine: 0.49 mg/dL (ref 0.44–1.00)
GFR, Est AFR Am: >60 mL/min (ref >60)
GFR, Estimated: >60 mL/min (ref >60)
Glucose, Bld: 95 mg/dL (ref 70–99)
Potassium: 4.4 mmol/L (ref 3.5–5.1)
Sodium: 130 mmol/L — ABNORMAL LOW (ref 135–145)
Total Bilirubin: 0.2 mg/dL — ABNORMAL LOW (ref 0.3–1.2)
Total Protein: 6.1 g/dL — ABNORMAL LOW (ref 6.5–8.1)

## 2019-05-04 LAB — CBC WITH DIFFERENTIAL (CANCER CENTER ONLY)
Abs Immature Granulocytes: 0.19 10*3/uL — ABNORMAL HIGH (ref 0.00–0.07)
Basophils Absolute: 0.1 10*3/uL (ref 0.0–0.1)
Basophils Relative: 0 %
Eosinophils Absolute: 0 10*3/uL (ref 0.0–0.5)
Eosinophils Relative: 0 %
HCT: 32.9 % — ABNORMAL LOW (ref 36.0–46.0)
Hemoglobin: 11.1 g/dL — ABNORMAL LOW (ref 12.0–15.0)
Immature Granulocytes: 2 %
Lymphocytes Relative: 17 %
Lymphs Abs: 2.1 10*3/uL (ref 0.7–4.0)
MCH: 31.7 pg (ref 26.0–34.0)
MCHC: 33.7 g/dL (ref 30.0–36.0)
MCV: 94 fL (ref 80.0–100.0)
Monocytes Absolute: 0.8 10*3/uL (ref 0.1–1.0)
Monocytes Relative: 7 %
Neutro Abs: 9 10*3/uL — ABNORMAL HIGH (ref 1.7–7.7)
Neutrophils Relative %: 74 %
Platelet Count: 298 10*3/uL (ref 150–400)
RBC: 3.5 MIL/uL — ABNORMAL LOW (ref 3.87–5.11)
RDW: 16.2 % — ABNORMAL HIGH (ref 11.5–15.5)
WBC Count: 12.1 10*3/uL — ABNORMAL HIGH (ref 4.0–10.5)
nRBC: 0 % (ref 0.0–0.2)

## 2019-05-04 MED ORDER — SODIUM CHLORIDE 0.9 % IV SOLN
1500.0000 mg | Freq: Once | INTRAVENOUS | Status: AC
  Administered 2019-05-04: 1500 mg via INTRAVENOUS
  Filled 2019-05-04: qty 30

## 2019-05-04 MED ORDER — SODIUM CHLORIDE 0.9 % IV SOLN
Freq: Once | INTRAVENOUS | Status: AC
  Administered 2019-05-04: 12:00:00 via INTRAVENOUS
  Filled 2019-05-04: qty 250

## 2019-05-04 MED ORDER — PALONOSETRON HCL INJECTION 0.25 MG/5ML
0.2500 mg | Freq: Once | INTRAVENOUS | Status: AC
  Administered 2019-05-04: 0.25 mg via INTRAVENOUS

## 2019-05-04 MED ORDER — SODIUM CHLORIDE 0.9% FLUSH
10.0000 mL | INTRAVENOUS | Status: DC | PRN
  Administered 2019-05-04: 17:00:00 10 mL
  Filled 2019-05-04: qty 10

## 2019-05-04 MED ORDER — PALONOSETRON HCL INJECTION 0.25 MG/5ML
INTRAVENOUS | Status: AC
  Filled 2019-05-04: qty 5

## 2019-05-04 MED ORDER — SODIUM CHLORIDE 0.9 % IV SOLN
100.0000 mg/m2 | Freq: Once | INTRAVENOUS | Status: AC
  Administered 2019-05-04: 140 mg via INTRAVENOUS
  Filled 2019-05-04: qty 7

## 2019-05-04 MED ORDER — SODIUM CHLORIDE 0.9 % IV SOLN: 306.5000 mg | Freq: Once | INTRAVENOUS | Status: DC

## 2019-05-04 MED ORDER — SODIUM CHLORIDE 0.9 % IV SOLN
306.5000 mg | Freq: Once | INTRAVENOUS | Status: AC
  Administered 2019-05-04: 310 mg via INTRAVENOUS
  Filled 2019-05-04: qty 31

## 2019-05-04 MED ORDER — SODIUM CHLORIDE 0.9 % IV SOLN
Freq: Once | INTRAVENOUS | Status: AC
  Administered 2019-05-04: 13:00:00 via INTRAVENOUS
  Filled 2019-05-04: qty 5

## 2019-05-04 MED ORDER — SODIUM CHLORIDE 0.9% FLUSH
10.0000 mL | Freq: Once | INTRAVENOUS | Status: AC
  Administered 2019-05-04: 11:00:00 10 mL
  Filled 2019-05-04: qty 10

## 2019-05-04 MED ORDER — HEPARIN SOD (PORK) LOCK FLUSH 100 UNIT/ML IV SOLN
500.0000 [IU] | Freq: Once | INTRAVENOUS | Status: AC | PRN
  Administered 2019-05-04: 17:00:00 500 [IU]
  Filled 2019-05-04: qty 5

## 2019-05-04 NOTE — Progress Notes (Signed)
Pt stated that she was confused about when she was supposed to put on her numbing cream. I let her know to put it on 1 hour before flush appointment. Pt verbalized understanding.

## 2019-05-04 NOTE — Patient Instructions (Addendum)
[facility] [place] Discharge Instructions for Patients Receiving Chemotherapy  Today you received the following chemotherapy agents:  Durvalumab, carboplatin, etoposide   To help prevent nausea and vomiting after your treatment, we encourage you to take your nausea medication as prescribed.   If you develop nausea and vomiting that is not controlled by your nausea medication, call the clinic.   BELOW ARE SYMPTOMS THAT SHOULD BE REPORTED IMMEDIATELY:  *FEVER GREATER THAN 100.5 F  *CHILLS WITH OR WITHOUT FEVER  NAUSEA AND VOMITING THAT IS NOT CONTROLLED WITH YOUR NAUSEA MEDICATION  *UNUSUAL SHORTNESS OF BREATH  *UNUSUAL BRUISING OR BLEEDING  TENDERNESS IN MOUTH AND THROAT WITH OR WITHOUT PRESENCE OF ULCERS  *URINARY PROBLEMS  *BOWEL PROBLEMS  UNUSUAL RASH Items with * indicate a potential emergency and should be followed up as soon as possible.  Feel free to call the clinic should you have any questions or concerns. The clinic phone number is [phone].  Please show the [place] at check-in to the Emergency Department and triage nurse.

## 2019-05-04 NOTE — Progress Notes (Signed)
[facility] [phone]   Fax:([phone]) [address], [place], [place] [address]  DIAGNOSIS:  1) extensive stage small cell lung cancer presenting with mediastinal lymphadenopathy in addition to abdominal lymphadenopathy as well as adrenal metastasis diagnosed in June 2020. 2)  History of bilateral adenocarcinoma of the lung presenting with right upper lobe and left upper lobe pulmonary nodules  PRIOR THERAPY: status post curative SBRT completed in January 2020.  CURRENT THERAPY: Systemic chemotherapy with carboplatin for AUC of 5 on day 1, etoposide 100 mg/M2 and Imfinzi 1500 mg IV every 3 weeks.  Status post 3 cycles.  INTERVAL HISTORY: Pamela Russell 73 y.o. female returns to the clinic today for follow-up visit.  The patient is feeling fine today with no concerning complaints except for mild fatigue and shortness of breath with exertion.  She denied having any current chest pain, cough or hemoptysis.  She denied having any nausea, vomiting, diarrhea or constipation.  She has no significant weight loss or gain.  She has no headache or visual changes.  She continues to tolerate her treatment with chemotherapy fairly well.  The patient is here today for evaluation before starting cycle #4.   MEDICAL HISTORY: Past Medical History:  Diagnosis Date  . Allergic rhinitis   . Asthma 2008  . Chronic obstructive pulmonary disease (COPD) ([place]) 06/08/2016   Meds: Spiriva, albuterol inhaler PFT (11/29/2016): Moderate-severe obstruction by PFT 11/2016  . Ear itching 07/05/2017  . Grieving 06/08/2016  . History of pneumonia    x4  . Hyperlipidemia 09/10/2016   On atorvastatin 40 mg QD, ASA 81 mg QD ASCVD risk 17.1%, mod-high intensity statin Risk factors: FHx sig for MI: mother 47s-50s, and father age 4, HTN, smoker  . Hypertension   . Night sweats 06/08/2016  . NSCL Ca dx'd 05/2018   xrt comp 09/2018  .  Pulmonary nodules 06/12/2017  . Tobacco use disorder 07/06/2016   On nicotine replacement patch. Smoking cigs since age 30. Has 60 pack year history. Has COPD dx on SABA and LAMA.    ALLERGIES:  is allergic to codeine; penicillins; and anoro ellipta [umeclidinium-vilanterol].  MEDICATIONS:  Current Outpatient Medications  Medication Sig Dispense Refill  . albuterol (VENTOLIN HFA) 108 (90 Base) MCG/ACT inhaler USE 2 INHALATIONS BY MOUTH  EVERY 6 HOURS AS NEEDED FOR WHEEZING OR SHORTNESS OF  BREATH (Patient taking differently: Inhale 2 puffs into the lungs every 6 (six) hours as needed for wheezing or shortness of breath. ) 17 g 2  . aspirin EC 81 MG tablet Take 81 mg by mouth daily.    . cetirizine (ZYRTEC) 10 MG tablet Take 10 mg by mouth daily.    . fluticasone (FLONASE) 50 MCG/ACT nasal spray Place 2 sprays into both nostrils daily. 16 g 1  . glucosamine-chondroitin 500-400 MG tablet Take 1 tablet by mouth daily.     . Glycopyrrolate-Formoterol (BEVESPI AEROSPHERE) 9-4.8 MCG/ACT AERO Inhale 2 puffs into the lungs 2 (two) times daily. 3 Inhaler 2  . ibuprofen (ADVIL) 200 MG tablet Take 400 mg by mouth every 6 (six) hours as needed for moderate pain.    Marland Kitchen ipratropium-albuterol (DUONEB) 0.5-2.5 (3) MG/3ML SOLN Take 3 mLs by nebulization every 6 (six) hours as needed. 360 mL 3  . lidocaine-prilocaine (EMLA) cream Apply 1 application topically daily as needed (port).     . mometasone (ELOCON) 0.1 % lotion Apply 1 application topically  daily.    . Multiple Vitamin (MULTIVITAMIN WITH MINERALS) TABS tablet Take 1 tablet by mouth daily.    . prochlorperazine (COMPAZINE) 10 MG tablet TAKE 1 TABLET BY MOUTH EVERY 6 HOURS AS NEEDED FOR NAUSEA AND VOMITING 30 tablet 0  . traZODone (DESYREL) 50 MG tablet Take 1 tablet (50 mg total) by mouth at bedtime as needed for sleep. 30 tablet 0   No current facility-administered medications for this visit.     SURGICAL HISTORY:  Past Surgical History:   Procedure Laterality Date  . CATARACT EXTRACTION Left 05/08/2016  . CATARACT EXTRACTION Right 05/22/2016  . IR IMAGING GUIDED PORT INSERTION  02/25/2019  . RADIOLOGY WITH ANESTHESIA N/A 02/13/2019   Procedure: BRAIN MRI;  Surgeon: Radiologist, Medication, MD;  Location: [facility];  Service: Radiology;  Laterality: N/A;  . TOTAL ABDOMINAL HYSTERECTOMY W/ BILATERAL SALPINGOOPHORECTOMY Bilateral 1975  . VIDEO BRONCHOSCOPY WITH ENDOBRONCHIAL NAVIGATION Left 07/09/2018   Procedure: VIDEO BRONCHOSCOPY WITH ENDOBRONCHIAL NAVIGATION;  Surgeon: Collene Gobble, MD;  Location: [facility];  Service: Thoracic;  Laterality: Left;    REVIEW OF SYSTEMS:  A comprehensive review of systems was negative except for: Constitutional: positive for fatigue Respiratory: positive for dyspnea on exertion   PHYSICAL EXAMINATION: General appearance: alert, cooperative, fatigued and no distress Head: Normocephalic, without obvious abnormality, atraumatic Neck: no adenopathy, no JVD, supple, symmetrical, trachea midline and thyroid not enlarged, symmetric, no tenderness/mass/nodules Lymph nodes: Cervical, supraclavicular, and axillary nodes normal. Resp: clear to auscultation bilaterally Back: symmetric, no curvature. ROM normal. No CVA tenderness. Cardio: regular rate and rhythm, S1, S2 normal, no murmur, click, rub or gallop GI: soft, non-tender; bowel sounds normal; no masses,  no organomegaly Extremities: extremities normal, atraumatic, no cyanosis or edema  ECOG PERFORMANCE STATUS: 1 - Symptomatic but completely ambulatory  Blood pressure (!) 154/70, pulse 79, temperature 98.3 F (36.8 C), temperature source Temporal, resp. rate 18, height 5\' 4"  (1.626 m), weight 87 lb 3.2 oz (39.6 kg), SpO2 100 %.  LABORATORY DATA: Lab Results  Component Value Date   WBC 12.1 (H) 05/04/2019   HGB 11.1 (L) 05/04/2019   HCT 32.9 (L) 05/04/2019   MCV 94.0 05/04/2019   PLT 298 05/04/2019      Chemistry      Component Value  Date/Time   NA 131 (L) 04/28/2019 1300   NA 130 (L) 01/26/2019 1153   K 4.6 04/28/2019 1300   CL 97 (L) 04/28/2019 1300   CO2 24 04/28/2019 1300   BUN 5 (L) 04/28/2019 1300   BUN 5 (L) 01/26/2019 1153   CREATININE 0.57 04/28/2019 1300   CREATININE 0.69 06/08/2016 1128      Component Value Date/Time   CALCIUM 9.1 04/28/2019 1300   ALKPHOS 170 (H) 04/28/2019 1300   AST 14 (L) 04/28/2019 1300   ALT 12 04/28/2019 1300   BILITOT <0.2 (L) 04/28/2019 1300       RADIOGRAPHIC STUDIES: No results found.  ASSESSMENT AND PLAN: This is a very pleasant 73 years old white female with recently diagnosed extensive stage small cell lung cancer and she is currently undergoing systemic chemotherapy with carboplatin, etoposide and Imfinzi status post 3 cycles.  The patient continues to tolerate this treatment well with no concerning adverse effects. I recommended for her to proceed with cycle #4 today as planned. She will come back for follow-up visit in 3 weeks for evaluation with repeat CT scan of the chest, abdomen pelvis for restaging of her disease. For the malnutrition, I strongly  encouraged the patient to increase her oral intake and use nutrition supplements. She was advised to call immediately if she has any concerning symptoms in the interval. The patient voices understanding of current disease status and treatment options and is in agreement with the current care plan. All questions were answered. The patient knows to call the clinic with any problems, questions or concerns. We can certainly see the patient much sooner if necessary.  I spent 10 minutes counseling the patient face to face. The total time spent in the appointment was 15 minutes.  Disclaimer: This note was dictated with voice recognition software. Similar sounding words can inadvertently be transcribed and may not be corrected upon review.

## 2019-05-05 ENCOUNTER — Inpatient Hospital Stay: Payer: Medicare Other

## 2019-05-05 ENCOUNTER — Other Ambulatory Visit: Payer: Self-pay | Admitting: Internal Medicine

## 2019-05-05 VITALS — BP 123/64 | HR 102 | Temp 98.4°F | Resp 18

## 2019-05-05 DIAGNOSIS — C3431 Malignant neoplasm of lower lobe, right bronchus or lung: Secondary | ICD-10-CM

## 2019-05-05 DIAGNOSIS — C3412 Malignant neoplasm of upper lobe, left bronchus or lung: Secondary | ICD-10-CM | POA: Diagnosis not present

## 2019-05-05 DIAGNOSIS — Z23 Encounter for immunization: Secondary | ICD-10-CM

## 2019-05-05 MED ORDER — INFLUENZA VAC A&B SA ADJ QUAD 0.5 ML IM PRSY
0.5000 mL | PREFILLED_SYRINGE | Freq: Once | INTRAMUSCULAR | Status: AC
  Administered 2019-05-05: 0.5 mL via INTRAMUSCULAR

## 2019-05-05 MED ORDER — HEPARIN SOD (PORK) LOCK FLUSH 100 UNIT/ML IV SOLN
500.0000 [IU] | Freq: Once | INTRAVENOUS | Status: AC | PRN
  Administered 2019-05-05: 500 [IU]
  Filled 2019-05-05: qty 5

## 2019-05-05 MED ORDER — SODIUM CHLORIDE 0.9 % IV SOLN
Freq: Once | INTRAVENOUS | Status: AC
  Administered 2019-05-05: 14:00:00 via INTRAVENOUS
  Filled 2019-05-05: qty 250

## 2019-05-05 MED ORDER — INFLUENZA VAC A&B SA ADJ QUAD 0.5 ML IM PRSY
PREFILLED_SYRINGE | INTRAMUSCULAR | Status: AC
  Filled 2019-05-05: qty 0.5

## 2019-05-05 MED ORDER — DEXAMETHASONE SODIUM PHOSPHATE 10 MG/ML IJ SOLN
10.0000 mg | Freq: Once | INTRAMUSCULAR | Status: AC
  Administered 2019-05-05: 10 mg via INTRAVENOUS

## 2019-05-05 MED ORDER — SODIUM CHLORIDE 0.9% FLUSH
10.0000 mL | INTRAVENOUS | Status: DC | PRN
  Administered 2019-05-05: 10 mL
  Filled 2019-05-05: qty 10

## 2019-05-05 MED ORDER — SODIUM CHLORIDE 0.9 % IV SOLN
100.0000 mg/m2 | Freq: Once | INTRAVENOUS | Status: AC
  Administered 2019-05-05: 140 mg via INTRAVENOUS
  Filled 2019-05-05: qty 7

## 2019-05-05 MED ORDER — DEXAMETHASONE SODIUM PHOSPHATE 10 MG/ML IJ SOLN
INTRAMUSCULAR | Status: AC
  Filled 2019-05-05: qty 1

## 2019-05-05 NOTE — Progress Notes (Signed)
Nutrition Follow-up:  Patient with lung cancer receiving chemotherapy.    Met with patient during infusion.  Patient short of breath during visit.  Reports that she continues to not eat well the week of chemotherapy then she eats everything in site.  Reports she is still drinking 3-4 ensure per day and has purchased the higher calorie shakes.  Reports that yesterday she ate ham sandwich with soup and graham crackers during treatment yesterday.  DId not eat anything for supper last night.  Drank an ensure this am and ate few graham crackers.  Does not like peanut butter.      Medications: reviewed  Labs: Na 130  Anthropometrics:   Weight 87 lb decreased from 88 lb 4.8 zo on 8/24.    NUTRITION DIAGNOSIS: Inadequate oral intake continues   INTERVENTION:  Reviewed strategies to increase calories and protein in current eating pattern.  Encouraged eating q 2 hours, set a schedule Encouraged patient to continue to drink higher calorie shakes  Patient may benefit from trial of appetite stimulant    MONITORING, EVALUATION, GOAL: Patient will consume adequate calories and protein to prevent further weight loss   NEXT VISIT: Tuesday, October 6 during infusion  Liviah Cake B. Zenia Resides, [place], [place] Registered Dietitian [phone] (pager)

## 2019-05-05 NOTE — Patient Instructions (Signed)
[facility] Discharge Instructions for Patients Receiving Chemotherapy  Today you received the following chemotherapy agents Etoposide (VEPESID).  To help prevent nausea and vomiting after your treatment, we encourage you to take your nausea medication as prescribed.  If you develop nausea and vomiting that is not controlled by your nausea medication, call the clinic.   BELOW ARE SYMPTOMS THAT SHOULD BE REPORTED IMMEDIATELY:  *FEVER GREATER THAN 100.5 F  *CHILLS WITH OR WITHOUT FEVER  NAUSEA AND VOMITING THAT IS NOT CONTROLLED WITH YOUR NAUSEA MEDICATION  *UNUSUAL SHORTNESS OF BREATH  *UNUSUAL BRUISING OR BLEEDING  TENDERNESS IN MOUTH AND THROAT WITH OR WITHOUT PRESENCE OF ULCERS  *URINARY PROBLEMS  *BOWEL PROBLEMS  UNUSUAL RASH Items with * indicate a potential emergency and should be followed up as soon as possible.  Feel free to call the clinic should you have any questions or concerns. The clinic phone number is [phone].  Please show the [facility] at check-in to the Emergency Department and triage nurse.  Coronavirus (COVID-19) Are you at risk?  Are you at risk for the Coronavirus (COVID-19)?  To be considered HIGH RISK for Coronavirus (COVID-19), you have to meet the following criteria:  . Traveled to [place], [place], [place], [place] or [place]; or in the [place] to [place], [place], [place], or [place]; and have fever, cough, and shortness of breath within the last 2 weeks of travel OR . Been in close contact with a person diagnosed with COVID-19 within the last 2 weeks and have fever, cough, and shortness of breath . IF YOU DO NOT MEET THESE CRITERIA, YOU ARE CONSIDERED LOW RISK FOR COVID-19.  What to do if you are HIGH RISK for COVID-19?  Marland Kitchen If you are having a medical emergency, call 911. . Seek medical care right away. Before you go to a doctor's office, urgent care or emergency department, call ahead and tell them  about your recent travel, contact with someone diagnosed with COVID-19, and your symptoms. You should receive instructions from your physician's office regarding next steps of care.  . When you arrive at healthcare provider, tell the healthcare staff immediately you have returned from visiting [place], [place], [place], [place] or [place]; or traveled in the [place] to [place], [place], [place], or [place]; in the last two weeks or you have been in close contact with a person diagnosed with COVID-19 in the last 2 weeks.   . Tell the health care staff about your symptoms: fever, cough and shortness of breath. . After you have been seen by a medical provider, you will be either: o Tested for (COVID-19) and discharged home on quarantine except to seek medical care if symptoms worsen, and asked to  - Stay home and avoid contact with others until you get your results (4-5 days)  - Avoid travel on public transportation if possible (such as bus, train, or airplane) or o Sent to the Emergency Department by EMS for evaluation, COVID-19 testing, and possible admission depending on your condition and test results.  What to do if you are LOW RISK for COVID-19?  Reduce your risk of any infection by using the same precautions used for avoiding the common cold or flu:  Marland Kitchen Wash your hands often with soap and warm water for at least 20 seconds.  If soap and water are not readily available, use an alcohol-based hand sanitizer with at least 60% alcohol.  . If coughing or sneezing,  cover your mouth and nose by coughing or sneezing into the elbow areas of your shirt or coat, into a tissue or into your sleeve (not your hands). . Avoid shaking hands with others and consider head nods or verbal greetings only. . Avoid touching your eyes, nose, or mouth with unwashed hands.  . Avoid close contact with people who are sick. . Avoid places or events with large numbers of people in one location, like concerts or  sporting events. . Carefully consider travel plans you have or are making. . If you are planning any travel outside or inside the [place], visit the CDC's Travelers' Health webpage for the latest health notices. . If you have some symptoms but not all symptoms, continue to monitor at home and seek medical attention if your symptoms worsen. . If you are having a medical emergency, call 911.   [place] / e-Visit: [email or web address]         [facility]: [phone]  [facility]: [phone]                   [facility]: [phone]    Influenza Virus Vaccine (Flucelvax) What is this medicine? INFLUENZA VIRUS VACCINE (in floo EN zuh VAHY ruhs vak SEEN) helps to reduce the risk of getting influenza also known as the flu. The vaccine only helps protect you against some strains of the flu. This medicine may be used for other purposes; ask your health care provider or pharmacist if you have questions. COMMON BRAND NAME(S): FLUCELVAX What should I tell my health care provider before I take this medicine? They need to know if you have any of these conditions:  bleeding disorder like hemophilia  fever or infection  Guillain-Barre syndrome or other neurological problems  immune system problems  infection with the human immunodeficiency virus (HIV) or AIDS  low blood platelet counts  multiple sclerosis  an unusual or allergic reaction to influenza virus vaccine, other medicines, foods, dyes or preservatives  pregnant or trying to get pregnant  breast-feeding How should I use this medicine? This vaccine is for injection into a muscle. It is given by a health care professional. A copy of Vaccine Information Statements will be given before each vaccination. Read this sheet carefully each time. The sheet may change frequently. Talk to your  pediatrician regarding the use of this medicine in children. Special care may be needed. Overdosage: If you think you've taken too much of this medicine contact a poison control center or emergency room at once. Overdosage: If you think you have taken too much of this medicine contact a poison control center or emergency room at once. NOTE: This medicine is only for you. Do not share this medicine with others. What if I miss a dose? This does not apply. What may interact with this medicine?  chemotherapy or radiation therapy  medicines that lower your immune system like etanercept, anakinra, infliximab, and adalimumab  medicines that treat or prevent blood clots like warfarin  phenytoin  steroid medicines like prednisone or cortisone  theophylline  vaccines This list may not describe all possible interactions. Give your health care provider a list of all the medicines, herbs, non-prescription drugs, or dietary supplements you use. Also tell them if you smoke, drink alcohol, or use illegal drugs. Some items may interact with your medicine. What should I watch for while using this medicine? Report any side effects that do not  go away within 3 days to your doctor or health care professional. Call your health care provider if any unusual symptoms occur within 6 weeks of receiving this vaccine. You may still catch the flu, but the illness is not usually as bad. You cannot get the flu from the vaccine. The vaccine will not protect against colds or other illnesses that may cause fever. The vaccine is needed every year. What side effects may I notice from receiving this medicine? Side effects that you should report to your doctor or health care professional as soon as possible:  allergic reactions like skin rash, itching or hives, swelling of the face, lips, or tongue Side effects that usually do not require medical attention (Report these to your doctor or health care professional if they  continue or are bothersome.):  fever  headache  muscle aches and pains  pain, tenderness, redness, or swelling at the injection site  tiredness This list may not describe all possible side effects. Call your doctor for medical advice about side effects. You may report side effects to FDA at 1-800-FDA-1088. Where should I keep my medicine? The vaccine will be given by a health care professional in a clinic, pharmacy, doctor's office, or other health care setting. You will not be given vaccine doses to store at home. NOTE: This sheet is a summary. It may not cover all possible information. If you have questions about this medicine, talk to your doctor, pharmacist, or health care provider.  2020 Elsevier/Gold Standard (2011-07-18 14:06:47)

## 2019-05-06 ENCOUNTER — Other Ambulatory Visit: Payer: Self-pay

## 2019-05-06 ENCOUNTER — Inpatient Hospital Stay: Payer: Medicare Other

## 2019-05-06 ENCOUNTER — Other Ambulatory Visit: Payer: Self-pay | Admitting: Internal Medicine

## 2019-05-06 VITALS — BP 146/67 | HR 87 | Temp 98.3°F | Resp 18

## 2019-05-06 DIAGNOSIS — C3412 Malignant neoplasm of upper lobe, left bronchus or lung: Secondary | ICD-10-CM | POA: Diagnosis not present

## 2019-05-06 DIAGNOSIS — C3431 Malignant neoplasm of lower lobe, right bronchus or lung: Secondary | ICD-10-CM

## 2019-05-06 MED ORDER — DEXAMETHASONE SODIUM PHOSPHATE 10 MG/ML IJ SOLN
10.0000 mg | Freq: Once | INTRAMUSCULAR | Status: AC
  Administered 2019-05-06: 10 mg via INTRAVENOUS

## 2019-05-06 MED ORDER — DEXAMETHASONE SODIUM PHOSPHATE 10 MG/ML IJ SOLN
INTRAMUSCULAR | Status: AC
  Filled 2019-05-06: qty 1

## 2019-05-06 MED ORDER — SODIUM CHLORIDE 0.9 % IV SOLN
Freq: Once | INTRAVENOUS | Status: AC
  Administered 2019-05-06: 15:00:00 via INTRAVENOUS
  Filled 2019-05-06: qty 250

## 2019-05-06 MED ORDER — HEPARIN SOD (PORK) LOCK FLUSH 100 UNIT/ML IV SOLN
500.0000 [IU] | Freq: Once | INTRAVENOUS | Status: AC | PRN
  Administered 2019-05-06: 17:00:00 500 [IU]
  Filled 2019-05-06: qty 5

## 2019-05-06 MED ORDER — SODIUM CHLORIDE 0.9 % IV SOLN
100.0000 mg/m2 | Freq: Once | INTRAVENOUS | Status: AC
  Administered 2019-05-06: 15:00:00 140 mg via INTRAVENOUS
  Filled 2019-05-06: qty 7

## 2019-05-06 MED ORDER — SODIUM CHLORIDE 0.9% FLUSH
10.0000 mL | INTRAVENOUS | Status: DC | PRN
  Administered 2019-05-06: 17:00:00 10 mL
  Filled 2019-05-06: qty 10

## 2019-05-06 NOTE — Patient Instructions (Signed)
[facility] Discharge Instructions for Patients Receiving Chemotherapy  Today you received the following chemotherapy agents Etoposide (VEPESID).  To help prevent nausea and vomiting after your treatment, we encourage you to take your nausea medication as prescribed.  If you develop nausea and vomiting that is not controlled by your nausea medication, call the clinic.   BELOW ARE SYMPTOMS THAT SHOULD BE REPORTED IMMEDIATELY:  *FEVER GREATER THAN 100.5 F  *CHILLS WITH OR WITHOUT FEVER  NAUSEA AND VOMITING THAT IS NOT CONTROLLED WITH YOUR NAUSEA MEDICATION  *UNUSUAL SHORTNESS OF BREATH  *UNUSUAL BRUISING OR BLEEDING  TENDERNESS IN MOUTH AND THROAT WITH OR WITHOUT PRESENCE OF ULCERS  *URINARY PROBLEMS  *BOWEL PROBLEMS  UNUSUAL RASH Items with * indicate a potential emergency and should be followed up as soon as possible.  Feel free to call the clinic should you have any questions or concerns. The clinic phone number is [phone].  Please show the [facility] at check-in to the Emergency Department and triage nurse.  Coronavirus (COVID-19) Are you at risk?  Are you at risk for the Coronavirus (COVID-19)?  To be considered HIGH RISK for Coronavirus (COVID-19), you have to meet the following criteria:  . Traveled to [place], [place], [place], [place] or [place]; or in the [place] to [place], [place], [place], or [place]; and have fever, cough, and shortness of breath within the last 2 weeks of travel OR . Been in close contact with a person diagnosed with COVID-19 within the last 2 weeks and have fever, cough, and shortness of breath . IF YOU DO NOT MEET THESE CRITERIA, YOU ARE CONSIDERED LOW RISK FOR COVID-19.  What to do if you are HIGH RISK for COVID-19?  Marland Kitchen If you are having a medical emergency, call 911. . Seek medical care right away. Before you go to a doctor's office, urgent care or emergency department, call ahead and tell them  about your recent travel, contact with someone diagnosed with COVID-19, and your symptoms. You should receive instructions from your physician's office regarding next steps of care.  . When you arrive at healthcare provider, tell the healthcare staff immediately you have returned from visiting [place], [place], [place], [place] or [place]; or traveled in the [place] to [place], [place], [place], or [place]; in the last two weeks or you have been in close contact with a person diagnosed with COVID-19 in the last 2 weeks.   . Tell the health care staff about your symptoms: fever, cough and shortness of breath. . After you have been seen by a medical provider, you will be either: o Tested for (COVID-19) and discharged home on quarantine except to seek medical care if symptoms worsen, and asked to  - Stay home and avoid contact with others until you get your results (4-5 days)  - Avoid travel on public transportation if possible (such as bus, train, or airplane) or o Sent to the Emergency Department by EMS for evaluation, COVID-19 testing, and possible admission depending on your condition and test results.  What to do if you are LOW RISK for COVID-19?  Reduce your risk of any infection by using the same precautions used for avoiding the common cold or flu:  Marland Kitchen Wash your hands often with soap and warm water for at least 20 seconds.  If soap and water are not readily available, use an alcohol-based hand sanitizer with at least 60% alcohol.  . If coughing or sneezing,  cover your mouth and nose by coughing or sneezing into the elbow areas of your shirt or coat, into a tissue or into your sleeve (not your hands). . Avoid shaking hands with others and consider head nods or verbal greetings only. . Avoid touching your eyes, nose, or mouth with unwashed hands.  . Avoid close contact with people who are sick. . Avoid places or events with large numbers of people in one location, like concerts or  sporting events. . Carefully consider travel plans you have or are making. . If you are planning any travel outside or inside the [place], visit the CDC's Travelers' Health webpage for the latest health notices. . If you have some symptoms but not all symptoms, continue to monitor at home and seek medical attention if your symptoms worsen. . If you are having a medical emergency, call 911.   [facility] / e-Visit: [email or web address]         [facility]: [phone]  [facility]: [phone]                   [facility]: [phone]    Influenza Virus Vaccine (Flucelvax) What is this medicine? INFLUENZA VIRUS VACCINE (in floo EN zuh VAHY ruhs vak SEEN) helps to reduce the risk of getting influenza also known as the flu. The vaccine only helps protect you against some strains of the flu. This medicine may be used for other purposes; ask your health care provider or pharmacist if you have questions. COMMON BRAND NAME(S): FLUCELVAX What should I tell my health care provider before I take this medicine? They need to know if you have any of these conditions:  bleeding disorder like hemophilia  fever or infection  Guillain-Barre syndrome or other neurological problems  immune system problems  infection with the human immunodeficiency virus (HIV) or AIDS  low blood platelet counts  multiple sclerosis  an unusual or allergic reaction to influenza virus vaccine, other medicines, foods, dyes or preservatives  pregnant or trying to get pregnant  breast-feeding How should I use this medicine? This vaccine is for injection into a muscle. It is given by a health care professional. A copy of Vaccine Information Statements will be given before each vaccination. Read this sheet carefully each time. The sheet may change frequently. Talk to your  pediatrician regarding the use of this medicine in children. Special care may be needed. Overdosage: If you think you've taken too much of this medicine contact a poison control center or emergency room at once. Overdosage: If you think you have taken too much of this medicine contact a poison control center or emergency room at once. NOTE: This medicine is only for you. Do not share this medicine with others. What if I miss a dose? This does not apply. What may interact with this medicine?  chemotherapy or radiation therapy  medicines that lower your immune system like etanercept, anakinra, infliximab, and adalimumab  medicines that treat or prevent blood clots like warfarin  phenytoin  steroid medicines like prednisone or cortisone  theophylline  vaccines This list may not describe all possible interactions. Give your health care provider a list of all the medicines, herbs, non-prescription drugs, or dietary supplements you use. Also tell them if you smoke, drink alcohol, or use illegal drugs. Some items may interact with your medicine. What should I watch for while using this medicine? Report any side effects that do not  go away within 3 days to your doctor or health care professional. Call your health care provider if any unusual symptoms occur within 6 weeks of receiving this vaccine. You may still catch the flu, but the illness is not usually as bad. You cannot get the flu from the vaccine. The vaccine will not protect against colds or other illnesses that may cause fever. The vaccine is needed every year. What side effects may I notice from receiving this medicine? Side effects that you should report to your doctor or health care professional as soon as possible:  allergic reactions like skin rash, itching or hives, swelling of the face, lips, or tongue Side effects that usually do not require medical attention (Report these to your doctor or health care professional if they  continue or are bothersome.):  fever  headache  muscle aches and pains  pain, tenderness, redness, or swelling at the injection site  tiredness This list may not describe all possible side effects. Call your doctor for medical advice about side effects. You may report side effects to FDA at 1-800-FDA-1088. Where should I keep my medicine? The vaccine will be given by a health care professional in a clinic, pharmacy, doctor's office, or other health care setting. You will not be given vaccine doses to store at home. NOTE: This sheet is a summary. It may not cover all possible information. If you have questions about this medicine, talk to your doctor, pharmacist, or health care provider.  2020 Elsevier/Gold Standard (2011-07-18 14:06:47)

## 2019-05-08 ENCOUNTER — Inpatient Hospital Stay: Payer: Medicare Other

## 2019-05-08 ENCOUNTER — Other Ambulatory Visit: Payer: Self-pay

## 2019-05-08 VITALS — BP 138/72 | HR 82 | Temp 98.2°F | Resp 18

## 2019-05-08 DIAGNOSIS — C3412 Malignant neoplasm of upper lobe, left bronchus or lung: Secondary | ICD-10-CM | POA: Diagnosis not present

## 2019-05-08 DIAGNOSIS — C3431 Malignant neoplasm of lower lobe, right bronchus or lung: Secondary | ICD-10-CM

## 2019-05-08 MED ORDER — PEGFILGRASTIM-CBQV 6 MG/0.6ML ~~LOC~~ SOSY
6.0000 mg | PREFILLED_SYRINGE | Freq: Once | SUBCUTANEOUS | Status: AC
  Administered 2019-05-08: 14:00:00 6 mg via SUBCUTANEOUS

## 2019-05-08 MED ORDER — PEGFILGRASTIM-CBQV 6 MG/0.6ML ~~LOC~~ SOSY
PREFILLED_SYRINGE | SUBCUTANEOUS | Status: AC
  Filled 2019-05-08: qty 0.6

## 2019-05-11 ENCOUNTER — Inpatient Hospital Stay: Payer: Medicare Other

## 2019-05-11 ENCOUNTER — Other Ambulatory Visit: Payer: Self-pay

## 2019-05-11 DIAGNOSIS — C3431 Malignant neoplasm of lower lobe, right bronchus or lung: Secondary | ICD-10-CM

## 2019-05-11 DIAGNOSIS — C3412 Malignant neoplasm of upper lobe, left bronchus or lung: Secondary | ICD-10-CM | POA: Diagnosis not present

## 2019-05-11 LAB — CMP (CANCER CENTER ONLY)
ALT: 7 U/L (ref 0–44)
AST: 9 U/L — ABNORMAL LOW (ref 15–41)
Albumin: 4 g/dL (ref 3.5–5.0)
Alkaline Phosphatase: 157 U/L — ABNORMAL HIGH (ref 38–126)
Anion gap: 7 (ref 5–15)
BUN: 8 mg/dL (ref 8–23)
CO2: 27 mmol/L (ref 22–32)
Calcium: 9.2 mg/dL (ref 8.9–10.3)
Chloride: 99 mmol/L (ref 98–111)
Creatinine: 0.5 mg/dL (ref 0.44–1.00)
GFR, Est AFR Am: >60 mL/min (ref >60)
GFR, Estimated: >60 mL/min (ref >60)
Glucose, Bld: 119 mg/dL — ABNORMAL HIGH (ref 70–99)
Potassium: 4.2 mmol/L (ref 3.5–5.1)
Sodium: 133 mmol/L — ABNORMAL LOW (ref 135–145)
Total Bilirubin: 0.2 mg/dL — ABNORMAL LOW (ref 0.3–1.2)
Total Protein: 6.2 g/dL — ABNORMAL LOW (ref 6.5–8.1)

## 2019-05-11 LAB — CBC WITH DIFFERENTIAL (CANCER CENTER ONLY)
Abs Immature Granulocytes: 0.49 10*3/uL — ABNORMAL HIGH (ref 0.00–0.07)
Basophils Absolute: 0.1 10*3/uL (ref 0.0–0.1)
Basophils Relative: 1 %
Eosinophils Absolute: 0 10*3/uL (ref 0.0–0.5)
Eosinophils Relative: 0 %
HCT: 29.9 % — ABNORMAL LOW (ref 36.0–46.0)
Hemoglobin: 9.9 g/dL — ABNORMAL LOW (ref 12.0–15.0)
Immature Granulocytes: 2 %
Lymphocytes Relative: 7 %
Lymphs Abs: 1.8 10*3/uL (ref 0.7–4.0)
MCH: 32.6 pg (ref 26.0–34.0)
MCHC: 33.1 g/dL (ref 30.0–36.0)
MCV: 98.4 fL (ref 80.0–100.0)
Monocytes Absolute: 0.5 10*3/uL (ref 0.1–1.0)
Monocytes Relative: 2 %
Neutro Abs: 23.6 10*3/uL — ABNORMAL HIGH (ref 1.7–7.7)
Neutrophils Relative %: 88 %
Platelet Count: 228 10*3/uL (ref 150–400)
RBC: 3.04 MIL/uL — ABNORMAL LOW (ref 3.87–5.11)
RDW: 15.3 % (ref 11.5–15.5)
WBC Count: 26.6 10*3/uL — ABNORMAL HIGH (ref 4.0–10.5)
nRBC: 0 % (ref 0.0–0.2)

## 2019-05-12 ENCOUNTER — Other Ambulatory Visit: Payer: Self-pay | Admitting: Radiation Therapy

## 2019-05-18 ENCOUNTER — Inpatient Hospital Stay: Payer: Medicare Other

## 2019-05-18 ENCOUNTER — Other Ambulatory Visit: Payer: Self-pay

## 2019-05-18 DIAGNOSIS — C3412 Malignant neoplasm of upper lobe, left bronchus or lung: Secondary | ICD-10-CM | POA: Diagnosis not present

## 2019-05-18 DIAGNOSIS — C3431 Malignant neoplasm of lower lobe, right bronchus or lung: Secondary | ICD-10-CM

## 2019-05-18 LAB — CMP (CANCER CENTER ONLY)
ALT: 17 U/L (ref 0–44)
AST: 15 U/L (ref 15–41)
Albumin: 4 g/dL (ref 3.5–5.0)
Alkaline Phosphatase: 131 U/L — ABNORMAL HIGH (ref 38–126)
Anion gap: 8 (ref 5–15)
BUN: 9 mg/dL (ref 8–23)
CO2: 28 mmol/L (ref 22–32)
Calcium: 9.3 mg/dL (ref 8.9–10.3)
Chloride: 99 mmol/L (ref 98–111)
Creatinine: 0.6 mg/dL (ref 0.44–1.00)
GFR, Est AFR Am: >60 mL/min (ref >60)
GFR, Estimated: >60 mL/min (ref >60)
Glucose, Bld: 103 mg/dL — ABNORMAL HIGH (ref 70–99)
Potassium: 4.2 mmol/L (ref 3.5–5.1)
Sodium: 135 mmol/L (ref 135–145)
Total Bilirubin: <0.2 mg/dL — ABNORMAL LOW (ref 0.3–1.2)
Total Protein: 6.7 g/dL (ref 6.5–8.1)

## 2019-05-18 LAB — CBC WITH DIFFERENTIAL (CANCER CENTER ONLY)
Abs Immature Granulocytes: 1.38 10*3/uL — ABNORMAL HIGH (ref 0.00–0.07)
Basophils Absolute: 0.1 10*3/uL (ref 0.0–0.1)
Basophils Relative: 1 %
Eosinophils Absolute: 0 10*3/uL (ref 0.0–0.5)
Eosinophils Relative: 0 %
HCT: 33.1 % — ABNORMAL LOW (ref 36.0–46.0)
Hemoglobin: 10.9 g/dL — ABNORMAL LOW (ref 12.0–15.0)
Immature Granulocytes: 9 %
Lymphocytes Relative: 11 %
Lymphs Abs: 1.8 10*3/uL (ref 0.7–4.0)
MCH: 32.6 pg (ref 26.0–34.0)
MCHC: 32.9 g/dL (ref 30.0–36.0)
MCV: 99.1 fL (ref 80.0–100.0)
Monocytes Absolute: 1 10*3/uL (ref 0.1–1.0)
Monocytes Relative: 6 %
Neutro Abs: 11.6 10*3/uL — ABNORMAL HIGH (ref 1.7–7.7)
Neutrophils Relative %: 73 %
Platelet Count: 237 10*3/uL (ref 150–400)
RBC: 3.34 MIL/uL — ABNORMAL LOW (ref 3.87–5.11)
RDW: 15.5 % (ref 11.5–15.5)
WBC Count: 16 10*3/uL — ABNORMAL HIGH (ref 4.0–10.5)
nRBC: 0 % (ref 0.0–0.2)

## 2019-05-18 LAB — TSH: TSH: 2.607 u[IU]/mL (ref 0.308–3.960)

## 2019-05-21 ENCOUNTER — Other Ambulatory Visit: Payer: Self-pay | Admitting: Internal Medicine

## 2019-05-22 ENCOUNTER — Other Ambulatory Visit: Payer: Self-pay

## 2019-05-22 ENCOUNTER — Ambulatory Visit (HOSPITAL_COMMUNITY)
Admission: RE | Admit: 2019-05-22 | Discharge: 2019-05-22 | Disposition: A | Discharge status: Home / Self Care | Payer: Medicare Other | Source: Ambulatory Visit | Attending: Internal Medicine | Admitting: Internal Medicine

## 2019-05-22 DIAGNOSIS — C3431 Malignant neoplasm of lower lobe, right bronchus or lung: Secondary | ICD-10-CM

## 2019-05-22 MED ORDER — IOHEXOL 300 MG/ML  SOLN
100.0000 mL | Freq: Once | INTRAMUSCULAR | Status: AC | PRN
  Administered 2019-05-22: 16:00:00 100 mL via INTRAVENOUS

## 2019-05-22 MED ORDER — SODIUM CHLORIDE (PF) 0.9 % IJ SOLN
INTRAMUSCULAR | Status: AC
  Filled 2019-05-22: qty 50

## 2019-05-22 MED ORDER — HEPARIN SOD (PORK) LOCK FLUSH 100 UNIT/ML IV SOLN
INTRAVENOUS | Status: AC
  Administered 2019-05-22: 500 [IU] via INTRAVENOUS
  Filled 2019-05-22: qty 5

## 2019-05-22 MED ORDER — HEPARIN SOD (PORK) LOCK FLUSH 100 UNIT/ML IV SOLN
500.0000 [IU] | Freq: Once | INTRAVENOUS | Status: AC
  Administered 2019-05-22: 16:00:00 500 [IU] via INTRAVENOUS

## 2019-05-24 ENCOUNTER — Other Ambulatory Visit: Payer: Self-pay | Admitting: Family Medicine

## 2019-05-25 ENCOUNTER — Inpatient Hospital Stay (HOSPITAL_BASED_OUTPATIENT_CLINIC_OR_DEPARTMENT_OTHER): Payer: Medicare Other | Admitting: Internal Medicine

## 2019-05-25 ENCOUNTER — Inpatient Hospital Stay: Payer: Medicare Other

## 2019-05-25 ENCOUNTER — Telehealth: Payer: Self-pay | Admitting: Internal Medicine

## 2019-05-25 ENCOUNTER — Encounter: Payer: Self-pay | Admitting: Internal Medicine

## 2019-05-25 ENCOUNTER — Inpatient Hospital Stay: Payer: Medicare Other | Attending: Internal Medicine

## 2019-05-25 ENCOUNTER — Other Ambulatory Visit: Payer: Self-pay

## 2019-05-25 VITALS — BP 142/69 | HR 108 | Temp 98.3°F | Resp 18 | Ht 64.0 in | Wt 84.7 lb

## 2019-05-25 VITALS — HR 100

## 2019-05-25 DIAGNOSIS — C3431 Malignant neoplasm of lower lobe, right bronchus or lung: Secondary | ICD-10-CM

## 2019-05-25 DIAGNOSIS — Z5112 Encounter for antineoplastic immunotherapy: Secondary | ICD-10-CM | POA: Insufficient documentation

## 2019-05-25 DIAGNOSIS — Z923 Personal history of irradiation: Secondary | ICD-10-CM | POA: Diagnosis not present

## 2019-05-25 DIAGNOSIS — Z9079 Acquired absence of other genital organ(s): Secondary | ICD-10-CM | POA: Insufficient documentation

## 2019-05-25 DIAGNOSIS — I10 Essential (primary) hypertension: Secondary | ICD-10-CM | POA: Diagnosis not present

## 2019-05-25 DIAGNOSIS — Z79899 Other long term (current) drug therapy: Secondary | ICD-10-CM | POA: Insufficient documentation

## 2019-05-25 DIAGNOSIS — C7949 Secondary malignant neoplasm of other parts of nervous system: Secondary | ICD-10-CM | POA: Insufficient documentation

## 2019-05-25 DIAGNOSIS — E46 Unspecified protein-calorie malnutrition: Secondary | ICD-10-CM | POA: Diagnosis not present

## 2019-05-25 DIAGNOSIS — Z791 Long term (current) use of non-steroidal anti-inflammatories (NSAID): Secondary | ICD-10-CM | POA: Insufficient documentation

## 2019-05-25 DIAGNOSIS — Z7982 Long term (current) use of aspirin: Secondary | ICD-10-CM | POA: Insufficient documentation

## 2019-05-25 DIAGNOSIS — Z7951 Long term (current) use of inhaled steroids: Secondary | ICD-10-CM | POA: Insufficient documentation

## 2019-05-25 DIAGNOSIS — C7931 Secondary malignant neoplasm of brain: Secondary | ICD-10-CM

## 2019-05-25 DIAGNOSIS — Z90722 Acquired absence of ovaries, bilateral: Secondary | ICD-10-CM | POA: Diagnosis not present

## 2019-05-25 DIAGNOSIS — C3411 Malignant neoplasm of upper lobe, right bronchus or lung: Secondary | ICD-10-CM | POA: Diagnosis not present

## 2019-05-25 DIAGNOSIS — Z87891 Personal history of nicotine dependence: Secondary | ICD-10-CM | POA: Diagnosis not present

## 2019-05-25 DIAGNOSIS — Z90710 Acquired absence of both cervix and uterus: Secondary | ICD-10-CM | POA: Insufficient documentation

## 2019-05-25 DIAGNOSIS — J449 Chronic obstructive pulmonary disease, unspecified: Secondary | ICD-10-CM | POA: Diagnosis not present

## 2019-05-25 DIAGNOSIS — C7971 Secondary malignant neoplasm of right adrenal gland: Secondary | ICD-10-CM | POA: Diagnosis not present

## 2019-05-25 DIAGNOSIS — C7972 Secondary malignant neoplasm of left adrenal gland: Secondary | ICD-10-CM | POA: Insufficient documentation

## 2019-05-25 LAB — CMP (CANCER CENTER ONLY)
ALT: 17 U/L (ref 0–44)
AST: 16 U/L (ref 15–41)
Albumin: 3.9 g/dL (ref 3.5–5.0)
Alkaline Phosphatase: 134 U/L — ABNORMAL HIGH (ref 38–126)
Anion gap: 12 (ref 5–15)
BUN: 6 mg/dL — ABNORMAL LOW (ref 8–23)
CO2: 23 mmol/L (ref 22–32)
Calcium: 9.4 mg/dL (ref 8.9–10.3)
Chloride: 95 mmol/L — ABNORMAL LOW (ref 98–111)
Creatinine: 0.52 mg/dL (ref 0.44–1.00)
GFR, Est AFR Am: >60 mL/min (ref >60)
GFR, Estimated: >60 mL/min (ref >60)
Glucose, Bld: 91 mg/dL (ref 70–99)
Potassium: 4.6 mmol/L (ref 3.5–5.1)
Sodium: 130 mmol/L — ABNORMAL LOW (ref 135–145)
Total Bilirubin: 0.3 mg/dL (ref 0.3–1.2)
Total Protein: 6.4 g/dL — ABNORMAL LOW (ref 6.5–8.1)

## 2019-05-25 LAB — CBC WITH DIFFERENTIAL (CANCER CENTER ONLY)
Abs Immature Granulocytes: 0.12 10*3/uL — ABNORMAL HIGH (ref 0.00–0.07)
Basophils Absolute: 0.1 10*3/uL (ref 0.0–0.1)
Basophils Relative: 1 %
Eosinophils Absolute: 0 10*3/uL (ref 0.0–0.5)
Eosinophils Relative: 0 %
HCT: 33.7 % — ABNORMAL LOW (ref 36.0–46.0)
Hemoglobin: 11.5 g/dL — ABNORMAL LOW (ref 12.0–15.0)
Immature Granulocytes: 1 %
Lymphocytes Relative: 13 %
Lymphs Abs: 1.7 10*3/uL (ref 0.7–4.0)
MCH: 32.3 pg (ref 26.0–34.0)
MCHC: 34.1 g/dL (ref 30.0–36.0)
MCV: 94.7 fL (ref 80.0–100.0)
Monocytes Absolute: 1 10*3/uL (ref 0.1–1.0)
Monocytes Relative: 8 %
Neutro Abs: 10 10*3/uL — ABNORMAL HIGH (ref 1.7–7.7)
Neutrophils Relative %: 77 %
Platelet Count: 358 10*3/uL (ref 150–400)
RBC: 3.56 MIL/uL — ABNORMAL LOW (ref 3.87–5.11)
RDW: 14.5 % (ref 11.5–15.5)
WBC Count: 12.9 10*3/uL — ABNORMAL HIGH (ref 4.0–10.5)
nRBC: 0 % (ref 0.0–0.2)

## 2019-05-25 MED ORDER — SODIUM CHLORIDE 0.9% FLUSH
10.0000 mL | INTRAVENOUS | Status: DC | PRN
  Administered 2019-05-25: 10 mL
  Filled 2019-05-25: qty 10

## 2019-05-25 MED ORDER — HEPARIN SOD (PORK) LOCK FLUSH 100 UNIT/ML IV SOLN
500.0000 [IU] | Freq: Once | INTRAVENOUS | Status: AC | PRN
  Administered 2019-05-25: 500 [IU]
  Filled 2019-05-25: qty 5

## 2019-05-25 MED ORDER — SODIUM CHLORIDE 0.9 % IV SOLN
Freq: Once | INTRAVENOUS | Status: AC
  Administered 2019-05-25: 12:00:00 via INTRAVENOUS
  Filled 2019-05-25: qty 250

## 2019-05-25 MED ORDER — SODIUM CHLORIDE 0.9 % IV SOLN
1500.0000 mg | Freq: Once | INTRAVENOUS | Status: AC
  Administered 2019-05-25: 1500 mg via INTRAVENOUS
  Filled 2019-05-25: qty 30

## 2019-05-25 NOTE — Progress Notes (Signed)
[facility] Telephone:(336) [phone]   Fax:(336) [place], [place], [address] 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 2)  History of bilateral adenocarcinoma of the lung presenting with right upper lobe and left upper lobe pulmonary nodules  PRIOR THERAPY:  1) status post curative SBRT completed in January 2020. 2) Systemic chemotherapy with carboplatin for AUC of 5 on day 1, etoposide 100 mg/M2 and Imfinzi 1500 mg IV every 3 weeks.  Status post 4 cycles.  CURRENT THERAPY: Maintenance treatment with Imfinzi 1500 mg IV every 4 weeks.  First dose May 25, 2019.  INTERVAL HISTORY: Pamela Russell 73 y.o. female returns to the clinic today for follow-up visit.  The patient is feeling fine today with no concerning complaints except for the fatigue.  She lost 3 more pounds since her last visit.  She denied having any chest pain, shortness of breath, cough or hemoptysis.  She denied having any fever or chills.  She has no nausea, vomiting, diarrhea or constipation.  She has no headache or visual changes.  She has no bleeding issues.  She has been tolerating her treatment with chemotherapy fairly well.  She had repeat CT scan of the chest, abdomen pelvis performed recently and she is here for evaluation and discussion of her scan results.   MEDICAL HISTORY: Past Medical History:  Diagnosis Date   Allergic rhinitis    Asthma 2008   Chronic obstructive pulmonary disease (COPD) ([facility]) 06/08/2016   Meds: Spiriva, albuterol inhaler PFT (11/29/2016): Moderate-severe obstruction by PFT 11/2016   Ear itching 07/05/2017   Grieving 06/08/2016   History of pneumonia    x4   Hyperlipidemia 09/10/2016   On atorvastatin 40 mg QD, ASA 81 mg QD ASCVD risk 17.1%,  mod-high intensity statin Risk factors: FHx sig for MI: mother 45s-50s, and father age 69, HTN, smoker   Hypertension    Night sweats 06/08/2016   NSCL Ca dx'd 05/2018   xrt comp 09/2018   Pulmonary nodules 06/12/2017   Tobacco use disorder 07/06/2016   On nicotine replacement patch. Smoking cigs since age 75. Has 60 pack year history. Has COPD dx on SABA and LAMA.    ALLERGIES:  is allergic to codeine; penicillins; and anoro ellipta [umeclidinium-vilanterol].  MEDICATIONS:  Current Outpatient Medications  Medication Sig Dispense Refill   albuterol (VENTOLIN HFA) 108 (90 Base) MCG/ACT inhaler USE 2 INHALATIONS BY MOUTH  EVERY 6 HOURS AS NEEDED FOR WHEEZING OR SHORTNESS OF  BREATH (Patient taking differently: Inhale 2 puffs into the lungs every 6 (six) hours as needed for wheezing or shortness of breath. ) 17 g 2   aspirin EC 81 MG tablet Take 81 mg by mouth daily.     cetirizine (ZYRTEC) 10 MG tablet Take 10 mg by mouth daily.     fluticasone (FLONASE) 50 MCG/ACT nasal spray Place 2 sprays into both nostrils daily. 16 g 1   glucosamine-chondroitin 500-400 MG tablet Take 1 tablet by mouth daily.      Glycopyrrolate-Formoterol (BEVESPI AEROSPHERE) 9-4.8 MCG/ACT AERO Inhale 2 puffs into the lungs 2 (two) times daily. 3 Inhaler 2   ibuprofen (ADVIL) 200 MG tablet Take 400 mg by mouth every 6 (six) hours as needed for moderate pain.     ipratropium-albuterol (DUONEB) 0.5-2.5 (3) MG/3ML  SOLN Take 3 mLs by nebulization every 6 (six) hours as needed. 360 mL 3   lidocaine-prilocaine (EMLA) cream Apply 1 application topically daily as needed (port).      mometasone (ELOCON) 0.1 % lotion Apply 1 application topically daily.     Multiple Vitamin (MULTIVITAMIN WITH MINERALS) TABS tablet Take 1 tablet by mouth daily.     prochlorperazine (COMPAZINE) 10 MG tablet TAKE 1 TABLET BY MOUTH EVERY 6 HOURS AS NEEDED FOR NAUSEA AND VOMITING 30 tablet 0   traZODone (DESYREL) 50 MG tablet Take 1  tablet (50 mg total) by mouth at bedtime as needed for sleep. 30 tablet 0   No current facility-administered medications for this visit.     SURGICAL HISTORY:  Past Surgical History:  Procedure Laterality Date   CATARACT EXTRACTION Left 05/08/2016   CATARACT EXTRACTION Right 05/22/2016   IR IMAGING GUIDED PORT INSERTION  02/25/2019   RADIOLOGY WITH ANESTHESIA N/A 02/13/2019   Procedure: BRAIN MRI;  Surgeon: Radiologist, Medication, MD;  Location: [facility];  Service: Radiology;  Laterality: N/A;   TOTAL ABDOMINAL HYSTERECTOMY W/ BILATERAL SALPINGOOPHORECTOMY Bilateral 1975   VIDEO BRONCHOSCOPY WITH ENDOBRONCHIAL NAVIGATION Left 07/09/2018   Procedure: VIDEO BRONCHOSCOPY WITH ENDOBRONCHIAL NAVIGATION;  Surgeon: Collene Gobble, MD;  Location: [facility];  Service: Thoracic;  Laterality: Left;    REVIEW OF SYSTEMS:  Constitutional: positive for fatigue and weight loss Eyes: negative Ears, nose, mouth, throat, and face: negative Respiratory: positive for cough Cardiovascular: negative Gastrointestinal: negative Genitourinary:negative Integument/breast: negative Hematologic/lymphatic: negative Musculoskeletal:negative Neurological: negative Behavioral/Psych: negative Endocrine: negative Allergic/Immunologic: negative   PHYSICAL EXAMINATION: General appearance: alert, cooperative, fatigued and no distress Head: Normocephalic, without obvious abnormality, atraumatic Neck: no adenopathy, no JVD, supple, symmetrical, trachea midline and thyroid not enlarged, symmetric, no tenderness/mass/nodules Lymph nodes: Cervical, supraclavicular, and axillary nodes normal. Resp: clear to auscultation bilaterally Back: symmetric, no curvature. ROM normal. No CVA tenderness. Cardio: regular rate and rhythm, S1, S2 normal, no murmur, click, rub or gallop GI: soft, non-tender; bowel sounds normal; no masses,  no organomegaly Extremities: extremities normal, atraumatic, no cyanosis or edema Neurologic:  Alert and oriented X 3, normal strength and tone. Normal symmetric reflexes. Normal coordination and gait  ECOG PERFORMANCE STATUS: 1 - Symptomatic but completely ambulatory  Blood pressure (!) 142/69, pulse (!) 108, temperature 98.3 F (36.8 C), temperature source Temporal, resp. rate 18, height 5\' 4"  (1.626 m), weight 84 lb 11.2 oz (38.4 kg), SpO2 97 %.  LABORATORY DATA: Lab Results  Component Value Date   WBC 12.9 (H) 05/25/2019   HGB 11.5 (L) 05/25/2019   HCT 33.7 (L) 05/25/2019   MCV 94.7 05/25/2019   PLT 358 05/25/2019      Chemistry      Component Value Date/Time   NA 135 05/18/2019 1341   NA 130 (L) 01/26/2019 1153   K 4.2 05/18/2019 1341   CL 99 05/18/2019 1341   CO2 28 05/18/2019 1341   BUN 9 05/18/2019 1341   BUN 5 (L) 01/26/2019 1153   CREATININE 0.60 05/18/2019 1341   CREATININE 0.69 06/08/2016 1128      Component Value Date/Time   CALCIUM 9.3 05/18/2019 1341   ALKPHOS 131 (H) 05/18/2019 1341   AST 15 05/18/2019 1341   ALT 17 05/18/2019 1341   BILITOT <0.2 (L) 05/18/2019 1341       RADIOGRAPHIC STUDIES: Ct Chest W Contrast  Result Date: 05/22/2019 CLINICAL DATA:  Small cell lung cancer assess response to therapy. Status post to 3 cycles  chemotherapy EXAM: CT CHEST, ABDOMEN, AND PELVIS WITH CONTRAST TECHNIQUE: Multidetector CT imaging of the chest, abdomen and pelvis was performed following the standard protocol during bolus administration of intravenous contrast. CONTRAST:  113mL OMNIPAQUE IOHEXOL 300 MG/ML  SOLN COMPARISON:  PET-CT 03/26/2019, CT chest Jan 16, 2019 FINDINGS: CT CHEST FINDINGS Cardiovascular: Coronary artery calcification and aortic atherosclerotic calcification. Mediastinum/Nodes: No axillary lymphadenopathy. Resolution of LEFT supraclavicular lymphadenopathy. Enlarged lymph nodes previously seen in the posterior mediastinum along the descending thoracic aorta near completely resolved. Example scant nodal tissue measuring 4 mm in thickness  (image 56/2) decreased from 12 mm on prior. No new adenopathy. Lungs/Pleura: Reticulonodular peribronchial thickening in the RIGHT upper lobe measuring 6 mm is similar to 6 mm on prior (image 50/6). More inferiorly in the RIGHT upper lobe sub solid nodule measuring 16 mm compares to 18 mm (image 57/6). No new nodularity LEFT or RIGHT lung. Musculoskeletal: No aggressive osseous lesion. CT ABDOMEN AND PELVIS FINDINGS Hepatobiliary: No focal hepatic lesion. No biliary ductal dilatation. Gallbladder is normal. Common bile duct is normal. Pancreas: Pancreas is normal. No ductal dilatation. No pancreatic inflammation. Spleen: Normal spleen Adrenals/urinary tract: Adrenal glands and kidneys are normal. The ureters and bladder normal. Stomach/Bowel: Stomach, small bowel, appendix, and cecum are normal. The colon and rectosigmoid colon are normal. Vascular/Lymphatic: Abdominal aorta normal caliber. No calcifications present. Near complete resolution of the periaortic retroperitoneum adenopathy seen on comparison exam. Scant tissue remains. For example 4 mm tissue on image 79/2 compares to a 7 mm thick node. Nodes along the iliac chains have also resolved. No pelvic lymphadenopathy Reproductive: Post hysterectomy Other: No free-fluid.  No peritoneal carcinomatosis Musculoskeletal: Compression fracture in the upper thoracic spine at T5 is new from CT 01/16/2019. Near complete loss vertebral body height at this level. Mild superior endplate compression fracture of T7 with approximately 25% loss of superior endplate height. IMPRESSION: Chest Impression: 1. Near complete resolution of mediastinal lymphadenopathy. Remaining scant nodal tissue is barely measurable and not pathologic by size criteria. 2. Stable reticular nodular and sub solid nodules in the RIGHT upper lobe. No interval change. No new nodularity. Abdomen / Pelvis Impression: 1. Near complete resolution of Retroperitoneal nodes and iliac lymph nodes. Remaining lymph  nodes are less than 5 mm and not pathologic by size criteria. 2. Severe compression fracture at T5 and mild compression fracture at T7. Compression fractures occurred between June and August 2020 by radiograph comparison. 3. Aortic Atherosclerosis (ICD10-I70.0). Electronically Signed   By: Suzy Bouchard M.D.   On: 05/22/2019 17:04   Ct Abdomen Pelvis W Contrast  Result Date: 05/22/2019 CLINICAL DATA:  Small cell lung cancer assess response to therapy. Status post to 3 cycles chemotherapy EXAM: CT CHEST, ABDOMEN, AND PELVIS WITH CONTRAST TECHNIQUE: Multidetector CT imaging of the chest, abdomen and pelvis was performed following the standard protocol during bolus administration of intravenous contrast. CONTRAST:  171mL OMNIPAQUE IOHEXOL 300 MG/ML  SOLN COMPARISON:  PET-CT 03/26/2019, CT chest Jan 16, 2019 FINDINGS: CT CHEST FINDINGS Cardiovascular: Coronary artery calcification and aortic atherosclerotic calcification. Mediastinum/Nodes: No axillary lymphadenopathy. Resolution of LEFT supraclavicular lymphadenopathy. Enlarged lymph nodes previously seen in the posterior mediastinum along the descending thoracic aorta near completely resolved. Example scant nodal tissue measuring 4 mm in thickness (image 56/2) decreased from 12 mm on prior. No new adenopathy. Lungs/Pleura: Reticulonodular peribronchial thickening in the RIGHT upper lobe measuring 6 mm is similar to 6 mm on prior (image 50/6). More inferiorly in the RIGHT upper lobe sub solid nodule  measuring 16 mm compares to 18 mm (image 57/6). No new nodularity LEFT or RIGHT lung. Musculoskeletal: No aggressive osseous lesion. CT ABDOMEN AND PELVIS FINDINGS Hepatobiliary: No focal hepatic lesion. No biliary ductal dilatation. Gallbladder is normal. Common bile duct is normal. Pancreas: Pancreas is normal. No ductal dilatation. No pancreatic inflammation. Spleen: Normal spleen Adrenals/urinary tract: Adrenal glands and kidneys are normal. The ureters and  bladder normal. Stomach/Bowel: Stomach, small bowel, appendix, and cecum are normal. The colon and rectosigmoid colon are normal. Vascular/Lymphatic: Abdominal aorta normal caliber. No calcifications present. Near complete resolution of the periaortic retroperitoneum adenopathy seen on comparison exam. Scant tissue remains. For example 4 mm tissue on image 79/2 compares to a 7 mm thick node. Nodes along the iliac chains have also resolved. No pelvic lymphadenopathy Reproductive: Post hysterectomy Other: No free-fluid.  No peritoneal carcinomatosis Musculoskeletal: Compression fracture in the upper thoracic spine at T5 is new from CT 01/16/2019. Near complete loss vertebral body height at this level. Mild superior endplate compression fracture of T7 with approximately 25% loss of superior endplate height. IMPRESSION: Chest Impression: 1. Near complete resolution of mediastinal lymphadenopathy. Remaining scant nodal tissue is barely measurable and not pathologic by size criteria. 2. Stable reticular nodular and sub solid nodules in the RIGHT upper lobe. No interval change. No new nodularity. Abdomen / Pelvis Impression: 1. Near complete resolution of Retroperitoneal nodes and iliac lymph nodes. Remaining lymph nodes are less than 5 mm and not pathologic by size criteria. 2. Severe compression fracture at T5 and mild compression fracture at T7. Compression fractures occurred between June and August 2020 by radiograph comparison. 3. Aortic Atherosclerosis (ICD10-I70.0). Electronically Signed   By: Suzy Bouchard M.D.   On: 05/22/2019 17:04    ASSESSMENT AND PLAN: This is a very pleasant 73 years old white female with recently diagnosed extensive stage small cell lung cancer and she is currently undergoing systemic chemotherapy with carboplatin, etoposide and Imfinzi status post 4 cycles.  Starting from cycle #5 the patient will be on maintenance treatment with Imfinzi 1500 mg IV every 4 weeks. She has been  tolerating her treatment fairly well with no concerning adverse effects. She had repeat CT scan of the chest, abdomen pelvis performed recently.  I personally and independently reviewed the scans and discussed the results with the patient today. Her scan showed significant improvement of her disease. I recommended for the patient to proceed to the maintenance phase of her treatment with single agent Imfinzi 1500 mg IV every 4 weeks and she will start the first cycle of this treatment today. For the malnutrition she was advised to increase her oral intake. For hypertension the patient will continue with her current blood pressure medication and monitor it closely at home. She will come back for follow-up visit in 4 weeks for evaluation before the next cycle of her treatment. The patient was advised to call immediately if she has any concerning symptoms in the interval. The patient voices understanding of current disease status and treatment options and is in agreement with the current care plan. All questions were answered. The patient knows to call the clinic with any problems, questions or concerns. We can certainly see the patient much sooner if necessary.  Disclaimer: This note was dictated with voice recognition software. Similar sounding words can inadvertently be transcribed and may not be corrected upon review.

## 2019-05-25 NOTE — Patient Instructions (Signed)
[facility] Discharge Instructions for Patients Receiving Chemotherapy  Today you received the following chemotherapy agents: Imfinzi.  To help prevent nausea and vomiting after your treatment, we encourage you to take your nausea medication as directed.   If you develop nausea and vomiting that is not controlled by your nausea medication, call the clinic.   BELOW ARE SYMPTOMS THAT SHOULD BE REPORTED IMMEDIATELY:  *FEVER GREATER THAN 100.5 F  *CHILLS WITH OR WITHOUT FEVER  NAUSEA AND VOMITING THAT IS NOT CONTROLLED WITH YOUR NAUSEA MEDICATION  *UNUSUAL SHORTNESS OF BREATH  *UNUSUAL BRUISING OR BLEEDING  TENDERNESS IN MOUTH AND THROAT WITH OR WITHOUT PRESENCE OF ULCERS  *URINARY PROBLEMS  *BOWEL PROBLEMS  UNUSUAL RASH Items with * indicate a potential emergency and should be followed up as soon as possible.  Feel free to call the clinic should you have any questions or concerns. The clinic phone number is [phone].  Please show the CHEMO ALERT CARD at check-in to the Emergency Department and triage nurse.   

## 2019-05-25 NOTE — Telephone Encounter (Signed)
Scheduled appt per 10/05 los - pt to get an updated schedule in treatment area.

## 2019-05-26 ENCOUNTER — Encounter: Payer: Self-pay | Admitting: Primary Care

## 2019-05-26 ENCOUNTER — Inpatient Hospital Stay: Payer: Medicare Other

## 2019-05-26 ENCOUNTER — Telehealth: Payer: Self-pay | Admitting: Pulmonary Disease

## 2019-05-26 ENCOUNTER — Telehealth (INDEPENDENT_AMBULATORY_CARE_PROVIDER_SITE_OTHER): Payer: Medicare Other | Admitting: Primary Care

## 2019-05-26 DIAGNOSIS — J0100 Acute maxillary sinusitis, unspecified: Secondary | ICD-10-CM

## 2019-05-26 DIAGNOSIS — J441 Chronic obstructive pulmonary disease with (acute) exacerbation: Secondary | ICD-10-CM

## 2019-05-26 MED ORDER — DOXYCYCLINE HYCLATE 100 MG PO TABS: 100.0000 mg | ORAL_TABLET | Freq: Two times a day (BID) | ORAL | 0 refills | Status: DC

## 2019-05-26 MED ORDER — PREDNISONE 10 MG PO TABS: ORAL_TABLET | ORAL | 0 refills | Status: DC

## 2019-05-26 MED ORDER — AZELASTINE HCL 0.1 % NA SOLN: 1.0000 | Freq: Two times a day (BID) | NASAL | 1 refills | Status: DC

## 2019-05-26 NOTE — Telephone Encounter (Signed)
Spoke with pt's daughter, Josephina Shih. States that the pt has been having increased chest congestion and coughing. Cough is deep and painful per the pt. The congestion is not be coughed up due to how "tight" it is. Pt has been scheduled for a MyChart visit with Beth today at 1330. Nothing further was needed.

## 2019-05-26 NOTE — Progress Notes (Signed)
Virtual Visit via Video Note  I connected with Pamela Russell on 05/26/19 at  1:30 PM EDT by a video enabled telemedicine application and verified that I am speaking with the correct person using two identifiers.  Location: Patient: Home Provider: Office   I discussed the limitations of evaluation and management by telemedicine and the availability of in person appointments. The patient expressed understanding and agreed to proceed.  History of Present Illness: 73 year old female, current smoker. PMH significant for tobacco abuse, hypertension, hyperlipidemia, COPD,  malignant neoplasm right upper lobe lung, secondary malignant neoplasm of brain and spinal cord. Patient of Dr. Vaughan Browner. Given sample of Bevespi during last OV in March.   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 Patient met with Dr. Roxan Hockey in December 2019 and was not interested in having surgical resection. Referred to Dr. Lisbeth Renshaw with radiation oncology for SBRT treatment in January. Repeat CT chest 01/16/19 showed unchanged right lung nodules, prior left suprahilar nodules no longer visualized. Suspected early localized radiation changes. Incidental findings extensive lymphadenopathy in the posterior mediastinum and centered in the upper abdomen- worrisome for lymphoma or nodal metastases for an unidentified abdominal malignancy. Patient was updated on these findings per radiation/oncology and referred to GI conference on 6/17.   Previous [facility] pulmonary encounters: 01/21/2019 Patient presents today for acute visit with complaints of shortness of breath x3-4 weeks. States that her breathing has worsened since radiation treatment. Reports that her oxygen drops to 87-89% with activity at home. Continues taking Bevespi as prescribed. She has a nebulizer at  home and used it a few days ago. She does not cough that often, when she does it's early in the morning and clear. Occasionally gets dizzy with position changes. Unable to walk >25 feet d/t deconditioning, using wheelchair today. Denies fever or sick contact. Labs return showed critical lab results, NA 117 and K 2.9. Advised ED.  02/04/2019 Patient called today for televisit. She has been to the ED twice for electrolyte imbalances (01/21/19 and 01/27/19). Hyperkalemia at lab draw and family practice office on 6/8 suspect error or hemolyzed specimen. EKG showed no evidence of hyperkalemia, repeat labs K 4.7. Sodium up to 131. Ok to stop potassium and magnesium supplement.  Recommend close follow-up with PCP. Needs repeat BMET and Mag. Patient is doing well today, states that she is feeling really good. Breathing has improved. Seen on 6/3 for increased shortness of breath after radiation. 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 She is only half pill today and has 5 days left. Still using Bevespi twice daily. Hasn't required her Albuterol rescue inhaler, uses occasionally out of habit.   04/01/2019 Patient presents today for follow-up. Visit shortend d/t nausea related to chemotherapy. States that it is a good day in terms of her breathing. No current wheezing. Seen on 8/8 in ED for COPD exacerbation. She was given supplemental oxygen, albuterol and atrovent with significant improvement in her shortness of breath. CXR without evidence of pneumonia, pneumothorax or pulmonary edema . She is requesting prescription for ipratropium-albuterol nebs and needs new nebulizer machine.  Recently seen by Dr. Julien Nordmann on 7/20 for an office visit. S/p SBRT completed in January 2020. 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 She reported intermittent diplopia during oncology follow-up on  8/3. She was  seen in ED on 7/29 where she had a repeat brain MRI which noted resolution previously seen 6mm left occipital lobe lesion and grossly similar appearing enlargement of pituitary gland. Dr. Vaslow to examine her formally, referral was placed.   05/26/2019 Patient contacted today by video visit for acute complaints of increased sinus congestion x 5-7 days. States that sinus congested started last week, mucus is yellow. Associated nasal pressure. She has been taking mucinex. Breathing is ok, loses her breath when walking to the bathroom. Requiring her nebulizer three times a day. Maintained on Bevespi twice daily. She also takes Zyrtec and Flonase daily. Receiving chemopherapy once a month, had treatment yesterday. Afebrile.    Observations/Objective:  - No significant shortness of breath, wheezing - Mild to moderate cough   Assessment and Plan:  COPD exacerbation secondary to acute bacterial sinusitis  - Rx Doxycycline 1 tab twice daily; Prednisone 20mg daily x 5 days  - Advised nasal saline rinses and Flonase/azelastine nasal spray  - Continue mucinex twice daily   Small cell lung cancer, right lower lobe - Continues with Chemotherapy/ Imfinzi with Oncology   Follow Up Instructions:   - She has a follow-up with Dr. Mannam scheduled for next week  I discussed the assessment and treatment plan with the patient. The patient was provided an opportunity to ask questions and all were answered. The patient agreed with the plan and demonstrated an understanding of the instructions.   The patient was advised to call back or seek an in-person evaluation if the symptoms worsen or if the condition fails to improve as anticipated.  I provided 20 minutes of non-face-to-face time during this encounter.    W , NP   

## 2019-05-26 NOTE — Patient Instructions (Addendum)
Recommendations: Continue Bevespi two puffs twice daily Continue Dueoneb every 6 hours as needed for shortness of breath   Sinusitis: Continue Flonase nasal spray daily Add azelastine 2 sprays twice daily Rx: Doxycycline 1 tab twice daily x 7 days and prednisone 20mg  x 5 days   Follow up with Dr. Vaughan Browner as scheduled

## 2019-05-27 ENCOUNTER — Inpatient Hospital Stay: Payer: Medicare Other

## 2019-05-29 ENCOUNTER — Inpatient Hospital Stay: Payer: Medicare Other

## 2019-06-02 ENCOUNTER — Other Ambulatory Visit: Payer: Self-pay

## 2019-06-02 ENCOUNTER — Ambulatory Visit: Payer: Medicare Other | Admitting: Pulmonary Disease

## 2019-06-02 ENCOUNTER — Encounter: Payer: Self-pay | Admitting: Pulmonary Disease

## 2019-06-02 VITALS — BP 112/68 | HR 110 | Temp 97.4°F | Ht 64.0 in | Wt 82.2 lb

## 2019-06-02 DIAGNOSIS — J441 Chronic obstructive pulmonary disease with (acute) exacerbation: Secondary | ICD-10-CM

## 2019-06-02 MED ORDER — TRELEGY ELLIPTA 100-62.5-25 MCG/INH IN AEPB: 1.0000 | INHALATION_SPRAY | Freq: Every day | RESPIRATORY_TRACT | 3 refills | Status: DC

## 2019-06-02 MED ORDER — TRELEGY ELLIPTA 100-62.5-25 MCG/INH IN AEPB: 1.0000 | INHALATION_SPRAY | Freq: Every day | RESPIRATORY_TRACT | 0 refills | Status: DC

## 2019-06-02 NOTE — Progress Notes (Signed)
Pamela Russell    [id number]    [birth year]  Primary Care Physician:Abraham, Harl Favor, DO  Referring Physician: Caroline More, DO [address],  [place] [address]  Chief complaint: Follow-up for lung cancer, COPD  HPI: 73 year old with history of lung cancer COPD, asthma, hypertension, active smoker  Diagnosed with adenocarcinoma after right upper lobe biopsy in November 2019.  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  Underwent chemotherapy with carboplatin and etoposide and is currently on Imfinzi  Pets: None Occupation: Used to work for [employer].  Retired in the 1990s. Exposures: No known exposures, no mold, hot tub Smoking history: 60 pack year smoking hisotry. Continues to smoke 1/2 ppd Travel History: Born and raised in [place].  No significant travel  Interim history: [name] for worsening shortness of breath with cough, yellow mucus and was given doxycycline and a prednisone taper.  The prednisone made her very shaky and anxious [name].  [name] Denies any fevers, chills  Outpatient Encounter Medications as of 06/02/2019  Medication Sig  . albuterol (VENTOLIN HFA) 108 (90 Base) MCG/ACT inhaler USE 2 INHALATIONS BY MOUTH  EVERY 6 HOURS AS NEEDED FOR WHEEZING OR SHORTNESS OF  BREATH (Patient taking differently: Inhale 2 puffs into the lungs every 6 (six) hours as needed for wheezing or shortness of breath. )  . aspirin EC 81 MG tablet Take 81 mg by mouth daily.  Marland Kitchen azelastine (ASTELIN) 0.1 % nasal spray Place 1 spray into both nostrils 2 (two) times daily. Use in each nostril as  directed  . cetirizine (ZYRTEC) 10 MG tablet Take 10 mg by mouth daily.  Marland Kitchen doxycycline (VIBRA-TABS) 100 MG tablet Take 1 tablet (100 mg total) by mouth 2 (two) times daily.  . fluticasone (FLONASE) 50 MCG/ACT nasal spray USE 2 SPRAYS IN BOTH  NOSTRILS DAILY  . glucosamine-chondroitin 500-400 MG tablet Take 1 tablet by mouth daily.   . Glycopyrrolate-Formoterol (BEVESPI AEROSPHERE) 9-4.8 MCG/ACT AERO Inhale 2 puffs into the lungs 2 (two) times daily.  Marland Kitchen ibuprofen (ADVIL) 200 MG tablet Take 400 mg by mouth every 6 (six) hours as needed for moderate pain.  Marland Kitchen ipratropium-albuterol (DUONEB) 0.5-2.5 (3) MG/3ML SOLN Take 3 mLs by nebulization every 6 (six) hours as needed.  . lidocaine-prilocaine (EMLA) cream Apply 1 application topically daily as needed (port).   . mometasone (ELOCON) 0.1 % lotion Apply 1 application topically daily.  . Multiple Vitamin (MULTIVITAMIN WITH MINERALS) TABS tablet Take 1 tablet by mouth daily.  . prochlorperazine (COMPAZINE) 10 MG tablet TAKE 1 TABLET BY MOUTH EVERY 6 HOURS AS NEEDED FOR NAUSEA AND VOMITING  . traZODone (DESYREL) 50 MG tablet Take 1 tablet (50 mg total) by mouth at bedtime as needed for sleep.  . [DISCONTINUED] predniSONE (DELTASONE) 10 MG tablet Take 2 tabs x 5 days  . [DISCONTINUED] prochlorperazine (COMPAZINE) 10 MG tablet Take 1 tablet (10 mg total) by mouth every 6 (six) hours as needed for nausea or vomiting.   No facility-administered encounter medications on file as of 06/02/2019.    Physical Exam: Blood pressure 130/68, pulse (!) 106, height 5\' 4"  (1.626 m), weight  103 lb (46.7 kg), SpO2 97 %. Gen:      No acute distress HEENT:  EOMI, sclera anicteric Neck:     No masses; no thyromegaly Lungs:    Clear to auscultation bilaterally; normal respiratory effort CV:         Regular rate and rhythm; no murmurs Abd:      + bowel sounds; soft, non-tender; no palpable masses, no distension Ext:    No edema; adequate peripheral perfusion Skin:       Warm and dry; no rash Neuro: alert and oriented x 3 Psych: normal mood and affect  Data Reviewed: Imaging: CT chest abdomen pelvis 05/22/2019 Complete resolution of mediastinal lymphadenopathy.  Stable reticular nodules in the right upper lobe.  Near complete resolution of retroperitoneal and iliac lymph nodes. Compression fracture of T5 and T7.  I have reviewed the images personally.  FENO 10/15/17 < 5  Spirometry 11/29/16 FVC 2.72 [96%], FEV1 1.10 (49%], F/F 40 Severe obstruction   PFTs 03/24/2018 FVC 2.27 [81%], FEV1 1.19 [57%], F/F 52, TLC 171%, DLCO 61% Moderate obstruction with hyperinflation, air trapping.  Moderate diffusion defect  Echo 01/22/2018- LVEF 60-65%, elevated ventricular end-diastolic filling pressure.? 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  Assessment:  Severe COPD PFTs reviewed with marked hyperinflation, air trapping [name] We will stop this and start on Trelegy inhaler [name] and will avoid further prednisone, especially given adverse reaction to the last course  Lung cancer Adenocarcinoma, small cell cancer Status post chemotherapy, XRT Last CT shows improvement in lymphadenopathy Follows with Dr. Julien Nordmann  Plan/Recommendations: - Stop Charolotte Eke, start Trelegy Follow-up in 2 to 4 weeks  Marshell Garfinkel MD [facility] 06/02/2019, 1:43 PM  CC: Caroline More, DO

## 2019-06-02 NOTE — Patient Instructions (Signed)
Sorry you not feeling well We will stop the Bevespi and duo nebs and start you on another inhaler called Trelegy We will have a telephone visit in 2 to 4 weeks to see how you are doing with the new inhaler

## 2019-06-03 ENCOUNTER — Other Ambulatory Visit: Payer: Self-pay | Admitting: Internal Medicine

## 2019-06-03 ENCOUNTER — Other Ambulatory Visit: Payer: Self-pay | Admitting: Pulmonary Disease

## 2019-06-08 ENCOUNTER — Telehealth: Payer: Self-pay | Admitting: Radiation Oncology

## 2019-06-09 ENCOUNTER — Ambulatory Visit
Admission: RE | Admit: 2019-06-09 | Discharge: 2019-06-09 | Disposition: A | Discharge status: Home / Self Care | Payer: Medicare Other | Source: Ambulatory Visit | Attending: Internal Medicine | Admitting: Internal Medicine

## 2019-06-09 ENCOUNTER — Other Ambulatory Visit: Payer: Self-pay

## 2019-06-09 VITALS — HR 92 | Resp 20 | Ht 64.0 in | Wt 82.4 lb

## 2019-06-09 DIAGNOSIS — C3412 Malignant neoplasm of upper lobe, left bronchus or lung: Secondary | ICD-10-CM

## 2019-06-09 NOTE — Progress Notes (Addendum)
Radiation Oncology         [phone] ________________________________  Name: Pamela Russell MRN: [id number]  Date of Service: 06/09/2019 DOB: 10/13/1945  Follow Up Note  CC: Caroline More, DO  Hayden Pedro,*  Diagnosis:   Stage IA2, cT1bN0M0 NSCLC, Adenocarcinoma of the RUL and Putative Stage IA2, T1bN0M0 NSCLC of the LUL.   Interval Since Last Radiation: 6 weeks   09/03/2018-09/12/2018 SBRT: The RUL tumor received 54 Gy in 3 fractions at 18 Gy per fraction.  The LUL tumor received 50 Gy in 5 fractions at 10 Gy per fraction.  Narrative:  The patient's history is somewhat complex with a history of Stage IA2, cT1bN0M0 NSCLC, adenocarcinoma of the RUL and Putative Stage IA2, T1bN0M0 NSCLC of the LUL. She was treated for her disease with SBRT in January of this year. She was being followed for her early stage disease when a CT of the chest on 01/16/2019 revealed extensive lymphadenopathy in the mediastinum, upper abdomen, and CT abdomen on 01/28/2019 revealed concerns for new disease in the left adrenal gland, and pelvic adenopathy on the left. She had new right hydroureteral nephrosis. She was getting set up for referral to medical oncology but developed numbness along her face and neck and an MRI brain without contrast on 02/12/2019 revealed concerns for a lesion in the right temporal lobe and the pituitary gland. She had an MRI with and without contrast the next day showing a 6 mm ring enhancing lesion in the left occipital lobe, either subacute infarct or metastasis, and enlargement of the pituitary gland consistent with adenoma or metastasis. IShe did not appear to have a secreting tumor on blood work and it was recommended that we follow this. She underwent a CT biopsy on 02/16/2019 of a RP node. This revealed small cell carcinoma consistent with a new lung primary. She began systemic chemotherapy with Dr. Julien Nordmann beginning on 03/02/2019 and has had a total of 4 cycles, and  continues on single agent imfinzi immunotherapy. With regard to her brain, she had repeat imagine on 03/18/19 given that she developed progressive numbness and visual changes despite steroids. She had technically similar appearing changes in the pituitary gland and resolution of the 6 mm ring enhancing lesion in the left occipital lobe. Her CT on 05/22/2019 of the [facility] showed significant improvement in her nodal and adrenal disease. She is scheduled for repeat MRI of the brain on 06/16/2019 with general anesthesia and is seen today in lieu of this.   On review of systems, the patient reports that she is doing well overall. She feels like she is still short of breath with mild exertion at home, but has a pulse oximeter and still has normal pulse and pulse ox per her daughter's report. She denies any chest pain, shortness of breath at rest, cough as long as she uses her inhalers, fevers, chills, night sweats, unintended weight changes. She denies any bowel or bladder disturbances, and denies abdominal pain, nausea or vomiting. She denies any new musculoskeletal or joint aches or pains, new skin lesions or concerns. A complete review of systems is obtained and is otherwise negative.   Past Medical History:  Past Medical History:  Diagnosis Date   Allergic rhinitis    Asthma 2008   Chronic obstructive pulmonary disease (COPD) ([place]) 06/08/2016   Meds: Spiriva, albuterol inhaler PFT (11/29/2016): Moderate-severe obstruction by PFT 11/2016   Ear itching 07/05/2017   Grieving 06/08/2016   History of pneumonia    x4  Hyperlipidemia 09/10/2016   On atorvastatin 40 mg QD, ASA 81 mg QD ASCVD risk 17.1%, mod-high intensity statin Risk factors: FHx sig for MI: mother 19s-50s, and father age 64, HTN, smoker   Hypertension    Night sweats 06/08/2016   NSCL Ca dx'd 05/2018   xrt comp 09/2018   Pulmonary nodules 06/12/2017   Tobacco use disorder 07/06/2016   On nicotine replacement patch. Smoking cigs  since age 40. Has 60 pack year history. Has COPD dx on SABA and LAMA.    Past Surgical History: Past Surgical History:  Procedure Laterality Date   CATARACT EXTRACTION Left 05/08/2016   CATARACT EXTRACTION Right 05/22/2016   IR IMAGING GUIDED PORT INSERTION  02/25/2019   RADIOLOGY WITH ANESTHESIA N/A 02/13/2019   Procedure: BRAIN MRI;  Surgeon: Radiologist, Medication, MD;  Location: [facility];  Service: Radiology;  Laterality: N/A;   TOTAL ABDOMINAL HYSTERECTOMY W/ BILATERAL SALPINGOOPHORECTOMY Bilateral 1975   VIDEO BRONCHOSCOPY WITH ENDOBRONCHIAL NAVIGATION Left 07/09/2018   Procedure: VIDEO BRONCHOSCOPY WITH ENDOBRONCHIAL NAVIGATION;  Surgeon: Collene Gobble, MD;  Location: [facility];  Service: Thoracic;  Laterality: Left;    Social History:  Social History   Socioeconomic History   Marital status: Widowed    Spouse name: Not on file   Number of children: 2   Years of education: Not on file   Highest education level: Not on file  Occupational History   Not on file  Social Needs   Financial resource strain: Not on file   Food insecurity    Worry: Not on file    Inability: Not on file   Transportation needs    Medical: No    Non-medical: No  Tobacco Use   Smoking status: Former Smoker    Packs/day: 1.00    Years: 60.00    Pack years: 60.00    Types: Cigarettes    Start date: 08/21/1955    Quit date: 01/28/2019    Years since quitting: 0.3   Smokeless tobacco: Never Used   Tobacco comment: 3 ppd for 20+ years, has cut down to 0.5 ppd, in the past 3 months, no cigs in 1 month  Substance and Sexual Activity   Alcohol use: Yes    Alcohol/week: 1.0 standard drinks    Types: 1 Cans of beer per week    Comment: occ   Drug use: No   Sexual activity: Not on file  Lifestyle   Physical activity    Days per week: Not on file    Minutes per session: Not on file   Stress: Not on file  Relationships   Social connections    Talks on phone: Not on file    Gets  together: Not on file    Attends religious service: Not on file    Active member of club or organization: Not on file    Attends meetings of clubs or organizations: Not on file    Relationship status: Not on file   Intimate partner violence    Fear of current or ex partner: Not on file    Emotionally abused: Not on file    Physically abused: Not on file    Forced sexual activity: Not on file  Other Topics Concern   Not on file  Social History Narrative   Not on file  The patient is widowed and lives in [place]. Her daughter Josephina Shih is very much involved in her mother's care and is a major support system for the patient.  Family History: Family History  Problem Relation Age of Onset   Heart disease Mother        MI in her 36's   Stroke Mother    Heart disease Father        CABG   Hypertension Father    Heart disease Sister 66       CABG   Stroke Sister    Diabetes Sister     ALLERGIES:  is allergic to codeine; penicillins; and anoro ellipta [umeclidinium-vilanterol].  Meds: Current Outpatient Medications  Medication Sig Dispense Refill   albuterol (VENTOLIN HFA) 108 (90 Base) MCG/ACT inhaler USE 2 INHALATIONS BY MOUTH  EVERY 6 HOURS AS NEEDED FOR WHEEZING OR SHORTNESS OF  BREATH 17 g 2   aspirin EC 81 MG tablet Take 81 mg by mouth daily.     azelastine (ASTELIN) 0.1 % nasal spray Place 1 spray into both nostrils 2 (two) times daily. Use in each nostril as directed 30 mL 1   cetirizine (ZYRTEC) 10 MG tablet Take 10 mg by mouth daily.     doxycycline (VIBRA-TABS) 100 MG tablet Take 1 tablet (100 mg total) by mouth 2 (two) times daily. 14 tablet 0   fluticasone (FLONASE) 50 MCG/ACT nasal spray USE 2 SPRAYS IN BOTH  NOSTRILS DAILY 32 g 1   Fluticasone-Umeclidin-Vilant (TRELEGY ELLIPTA) 100-62.5-25 MCG/INH AEPB Inhale 1 puff into the lungs daily. 60 each 3   Fluticasone-Umeclidin-Vilant (TRELEGY ELLIPTA) 100-62.5-25 MCG/INH AEPB Inhale 1 puff into the lungs  daily. 14 each 0   glucosamine-chondroitin 500-400 MG tablet Take 1 tablet by mouth daily.      ibuprofen (ADVIL) 200 MG tablet Take 400 mg by mouth every 6 (six) hours as needed for moderate pain.     lidocaine-prilocaine (EMLA) cream Apply 1 application topically daily as needed (port).      mometasone (ELOCON) 0.1 % lotion Apply 1 application topically daily.     Multiple Vitamin (MULTIVITAMIN WITH MINERALS) TABS tablet Take 1 tablet by mouth daily.     prochlorperazine (COMPAZINE) 10 MG tablet TAKE 1 TABLET BY MOUTH EVERY 6 HOURS AS NEEDED FOR NAUSEA AND VOMITING 30 tablet 0   traZODone (DESYREL) 50 MG tablet Take 1 tablet (50 mg total) by mouth at bedtime as needed for sleep. 30 tablet 0   No current facility-administered medications for this encounter.     Physical Findings: Wt Readings from Last 3 Encounters:  06/09/19 82 lb 6.4 oz (37.4 kg)  06/02/19 82 lb 3.2 oz (37.3 kg)  05/25/19 84 lb 11.2 oz (38.4 kg)   Pulse Readings from Last 3 Encounters:  06/09/19 92  06/02/19 (!) 110  05/25/19 100  Ox was 72% on RA.  In general this is a chronically ill appearing caucasian female in no acute distress. She's alert and oriented x4 and appropriate throughout the examination. Cardiopulmonary assessment is negative for acute distress and she exhibits normal effort.    Lab Findings: Lab Results  Component Value Date   WBC 12.9 (H) 05/25/2019   HGB 11.5 (L) 05/25/2019   HCT 33.7 (L) 05/25/2019   MCV 94.7 05/25/2019   PLT 358 05/25/2019     Radiographic Findings: Ct Chest W Contrast  Result Date: 05/22/2019 CLINICAL DATA:  Small cell lung cancer assess response to therapy. Status post to 3 cycles chemotherapy EXAM: CT CHEST, ABDOMEN, AND PELVIS WITH CONTRAST TECHNIQUE: Multidetector CT imaging of the chest, abdomen and pelvis was performed following the standard protocol during bolus administration of intravenous  contrast. CONTRAST:  134mL OMNIPAQUE IOHEXOL 300 MG/ML  SOLN  COMPARISON:  PET-CT 03/26/2019, CT chest Jan 16, 2019 FINDINGS: CT CHEST FINDINGS Cardiovascular: Coronary artery calcification and aortic atherosclerotic calcification. Mediastinum/Nodes: No axillary lymphadenopathy. Resolution of LEFT supraclavicular lymphadenopathy. Enlarged lymph nodes previously seen in the posterior mediastinum along the descending thoracic aorta near completely resolved. Example scant nodal tissue measuring 4 mm in thickness (image 56/2) decreased from 12 mm on prior. No new adenopathy. Lungs/Pleura: Reticulonodular peribronchial thickening in the RIGHT upper lobe measuring 6 mm is similar to 6 mm on prior (image 50/6). More inferiorly in the RIGHT upper lobe sub solid nodule measuring 16 mm compares to 18 mm (image 57/6). No new nodularity LEFT or RIGHT lung. Musculoskeletal: No aggressive osseous lesion. CT ABDOMEN AND PELVIS FINDINGS Hepatobiliary: No focal hepatic lesion. No biliary ductal dilatation. Gallbladder is normal. Common bile duct is normal. Pancreas: Pancreas is normal. No ductal dilatation. No pancreatic inflammation. Spleen: Normal spleen Adrenals/urinary tract: Adrenal glands and kidneys are normal. The ureters and bladder normal. Stomach/Bowel: Stomach, small bowel, appendix, and cecum are normal. The colon and rectosigmoid colon are normal. Vascular/Lymphatic: Abdominal aorta normal caliber. No calcifications present. Near complete resolution of the periaortic retroperitoneum adenopathy seen on comparison exam. Scant tissue remains. For example 4 mm tissue on image 79/2 compares to a 7 mm thick node. Nodes along the iliac chains have also resolved. No pelvic lymphadenopathy Reproductive: Post hysterectomy Other: No free-fluid.  No peritoneal carcinomatosis Musculoskeletal: Compression fracture in the upper thoracic spine at T5 is new from CT 01/16/2019. Near complete loss vertebral body height at this level. Mild superior endplate compression fracture of T7 with  approximately 25% loss of superior endplate height. IMPRESSION: Chest Impression: 1. Near complete resolution of mediastinal lymphadenopathy. Remaining scant nodal tissue is barely measurable and not pathologic by size criteria. 2. Stable reticular nodular and sub solid nodules in the RIGHT upper lobe. No interval change. No new nodularity. Abdomen / Pelvis Impression: 1. Near complete resolution of Retroperitoneal nodes and iliac lymph nodes. Remaining lymph nodes are less than 5 mm and not pathologic by size criteria. 2. Severe compression fracture at T5 and mild compression fracture at T7. Compression fractures occurred between June and August 2020 by radiograph comparison. 3. Aortic Atherosclerosis (ICD10-I70.0). Electronically Signed   By: Suzy Bouchard M.D.   On: 05/22/2019 17:04   Ct Abdomen Pelvis W Contrast  Result Date: 05/22/2019 CLINICAL DATA:  Small cell lung cancer assess response to therapy. Status post to 3 cycles chemotherapy EXAM: CT CHEST, ABDOMEN, AND PELVIS WITH CONTRAST TECHNIQUE: Multidetector CT imaging of the chest, abdomen and pelvis was performed following the standard protocol during bolus administration of intravenous contrast. CONTRAST:  147mL OMNIPAQUE IOHEXOL 300 MG/ML  SOLN COMPARISON:  PET-CT 03/26/2019, CT chest Jan 16, 2019 FINDINGS: CT CHEST FINDINGS Cardiovascular: Coronary artery calcification and aortic atherosclerotic calcification. Mediastinum/Nodes: No axillary lymphadenopathy. Resolution of LEFT supraclavicular lymphadenopathy. Enlarged lymph nodes previously seen in the posterior mediastinum along the descending thoracic aorta near completely resolved. Example scant nodal tissue measuring 4 mm in thickness (image 56/2) decreased from 12 mm on prior. No new adenopathy. Lungs/Pleura: Reticulonodular peribronchial thickening in the RIGHT upper lobe measuring 6 mm is similar to 6 mm on prior (image 50/6). More inferiorly in the RIGHT upper lobe sub solid nodule  measuring 16 mm compares to 18 mm (image 57/6). No new nodularity LEFT or RIGHT lung. Musculoskeletal: No aggressive osseous lesion. CT ABDOMEN AND PELVIS FINDINGS Hepatobiliary: No focal  hepatic lesion. No biliary ductal dilatation. Gallbladder is normal. Common bile duct is normal. Pancreas: Pancreas is normal. No ductal dilatation. No pancreatic inflammation. Spleen: Normal spleen Adrenals/urinary tract: Adrenal glands and kidneys are normal. The ureters and bladder normal. Stomach/Bowel: Stomach, small bowel, appendix, and cecum are normal. The colon and rectosigmoid colon are normal. Vascular/Lymphatic: Abdominal aorta normal caliber. No calcifications present. Near complete resolution of the periaortic retroperitoneum adenopathy seen on comparison exam. Scant tissue remains. For example 4 mm tissue on image 79/2 compares to a 7 mm thick node. Nodes along the iliac chains have also resolved. No pelvic lymphadenopathy Reproductive: Post hysterectomy Other: No free-fluid.  No peritoneal carcinomatosis Musculoskeletal: Compression fracture in the upper thoracic spine at T5 is new from CT 01/16/2019. Near complete loss vertebral body height at this level. Mild superior endplate compression fracture of T7 with approximately 25% loss of superior endplate height. IMPRESSION: Chest Impression: 1. Near complete resolution of mediastinal lymphadenopathy. Remaining scant nodal tissue is barely measurable and not pathologic by size criteria. 2. Stable reticular nodular and sub solid nodules in the RIGHT upper lobe. No interval change. No new nodularity. Abdomen / Pelvis Impression: 1. Near complete resolution of Retroperitoneal nodes and iliac lymph nodes. Remaining lymph nodes are less than 5 mm and not pathologic by size criteria. 2. Severe compression fracture at T5 and mild compression fracture at T7. Compression fractures occurred between June and August 2020 by radiograph comparison. 3. Aortic Atherosclerosis  (ICD10-I70.0). Electronically Signed   By: Suzy Bouchard M.D.   On: 05/22/2019 17:04    Impression/Plan: 1.  Stage IA2, cT1bN0M0 NSCLC, Adenocarcinoma of the RUL and Putative Stage IA2, T1bN0M0 NSCLC of the LUL with synchronous extensive stage small cell carcinoma of the lung. The patient appears to be doing pretty well despite her situation. I think her lung function is as stable as we can hope for and she recently saw Dr. Wonda Amis and continues per his recommendations. I think she should proceed with MRI of the brain. We discussed the scenario for considering radiotherapy in the prophylactic setting to treat the brain due to risks of developing brain disease from the histology type alone, versus alternative options given then findings that the pituitary gland could alter further discussion. We will review her case in conference next week and I will let the patient and her daughter know about recommendations from that conference. She and her daughter are in agreement. We will make sure Dr. Julien Nordmann is also aware of these recommendations as she continues on systemic immunotherapy.  2. COPD. The patient will continue to follow up with Dr. Vaughan Browner as well as we move forward and we appreciate his input for her care.    This encounter was provided by telemedicine platform MyChart.  The patient has given verbal consent for this type of encounter and has been advised to only accept a meeting of this type in a secure network environment. The time spent during this encounter was 30 minutes. The attendants for this meeting include Blenda Nicely, RN, Hayden Pedro  and [name]. Ms. Ponder daughter Mont Dutton was also on the call. During the encounter,  Blenda Nicely, RN  and Hayden Pedro were located at [facility] Radiation Oncology Department.  [name] and her daughter Josephina Shih were located at her daughter's home.      Carola Rhine, PAC

## 2019-06-13 ENCOUNTER — Other Ambulatory Visit (HOSPITAL_COMMUNITY)
Admission: RE | Admit: 2019-06-13 | Discharge: 2019-06-13 | Disposition: A | Discharge status: Home / Self Care | Payer: Medicare Other | Source: Ambulatory Visit | Attending: Internal Medicine | Admitting: Internal Medicine

## 2019-06-13 DIAGNOSIS — Z20828 Contact with and (suspected) exposure to other viral communicable diseases: Secondary | ICD-10-CM | POA: Insufficient documentation

## 2019-06-13 DIAGNOSIS — Z01812 Encounter for preprocedural laboratory examination: Secondary | ICD-10-CM | POA: Diagnosis present

## 2019-06-14 LAB — NOVEL CORONAVIRUS, NAA (HOSP ORDER, SEND-OUT TO REF LAB; TAT 18-24 HRS): SARS-CoV-2, NAA: NOT DETECTED

## 2019-06-15 ENCOUNTER — Other Ambulatory Visit: Payer: Self-pay

## 2019-06-15 ENCOUNTER — Encounter (HOSPITAL_COMMUNITY): Payer: Self-pay | Admitting: *Deleted

## 2019-06-15 DIAGNOSIS — R0600 Dyspnea, unspecified: Secondary | ICD-10-CM

## 2019-06-15 HISTORY — DX: Dyspnea, unspecified: R06.00

## 2019-06-15 NOTE — Progress Notes (Signed)
Pamela Russell has shortness of breath when she walks, patient has COPD, Lung Cancer and quit smoking in June of this year. Pamela Russell states that she has chest pain at times, the Drs know about it. Patrent states that  It has been going on for years, "I take a Tums and it goes away, I think it is indigestion." Patient reports that she has not had chest pain for a few months.   Patient tested negativeCovid and has been in quarantine since that time.

## 2019-06-16 ENCOUNTER — Ambulatory Visit (HOSPITAL_COMMUNITY)
Admission: RE | Admit: 2019-06-16 | Discharge: 2019-06-16 | Disposition: A | Discharge status: Home / Self Care | Payer: Medicare Other | Attending: Radiation Oncology | Admitting: Radiation Oncology

## 2019-06-16 ENCOUNTER — Encounter (HOSPITAL_COMMUNITY): Admission: RE | Disposition: A | Discharge status: Home / Self Care | Payer: Self-pay | Source: Home / Self Care

## 2019-06-16 ENCOUNTER — Telehealth: Payer: Self-pay | Admitting: Internal Medicine

## 2019-06-16 ENCOUNTER — Encounter (HOSPITAL_COMMUNITY): Payer: Self-pay

## 2019-06-16 ENCOUNTER — Ambulatory Visit (HOSPITAL_COMMUNITY): Payer: Medicare Other | Admitting: Certified Registered Nurse Anesthetist

## 2019-06-16 ENCOUNTER — Other Ambulatory Visit: Payer: Self-pay

## 2019-06-16 ENCOUNTER — Ambulatory Visit (HOSPITAL_COMMUNITY)
Admission: RE | Admit: 2019-06-16 | Discharge: 2019-06-16 | Disposition: A | Discharge status: Home / Self Care | Payer: Medicare Other | Source: Ambulatory Visit | Attending: Radiation Oncology | Admitting: Radiation Oncology

## 2019-06-16 DIAGNOSIS — J449 Chronic obstructive pulmonary disease, unspecified: Secondary | ICD-10-CM | POA: Insufficient documentation

## 2019-06-16 DIAGNOSIS — Z87891 Personal history of nicotine dependence: Secondary | ICD-10-CM | POA: Diagnosis not present

## 2019-06-16 DIAGNOSIS — C7949 Secondary malignant neoplasm of other parts of nervous system: Secondary | ICD-10-CM

## 2019-06-16 DIAGNOSIS — C7931 Secondary malignant neoplasm of brain: Secondary | ICD-10-CM | POA: Insufficient documentation

## 2019-06-16 DIAGNOSIS — C3490 Malignant neoplasm of unspecified part of unspecified bronchus or lung: Secondary | ICD-10-CM | POA: Insufficient documentation

## 2019-06-16 HISTORY — DX: Anxiety disorder, unspecified: F41.9

## 2019-06-16 HISTORY — DX: Pneumonia, unspecified organism: J18.9

## 2019-06-16 HISTORY — PX: RADIOLOGY WITH ANESTHESIA: SHX6223

## 2019-06-16 LAB — CBC
HCT: 38.3 % (ref 36.0–46.0)
Hemoglobin: 12.9 g/dL (ref 12.0–15.0)
MCH: 32.6 pg (ref 26.0–34.0)
MCHC: 33.7 g/dL (ref 30.0–36.0)
MCV: 96.7 fL (ref 80.0–100.0)
Platelets: 297 10*3/uL (ref 150–400)
RBC: 3.96 MIL/uL (ref 3.87–5.11)
RDW: 13 % (ref 11.5–15.5)
WBC: 6.7 10*3/uL (ref 4.0–10.5)
nRBC: 0 % (ref 0.0–0.2)

## 2019-06-16 LAB — COMPREHENSIVE METABOLIC PANEL
ALT: 14 U/L (ref 0–44)
AST: 18 U/L (ref 15–41)
Albumin: 4 g/dL (ref 3.5–5.0)
Alkaline Phosphatase: 77 U/L (ref 38–126)
Anion gap: 11 (ref 5–15)
BUN: 6 mg/dL — ABNORMAL LOW (ref 8–23)
CO2: 25 mmol/L (ref 22–32)
Calcium: 9.7 mg/dL (ref 8.9–10.3)
Chloride: 92 mmol/L — ABNORMAL LOW (ref 98–111)
Creatinine, Ser: 0.4 mg/dL — ABNORMAL LOW (ref 0.44–1.00)
GFR calc Af Amer: >60 mL/min (ref >60)
GFR calc non Af Amer: >60 mL/min (ref >60)
Glucose, Bld: 97 mg/dL (ref 70–99)
Potassium: 4.3 mmol/L (ref 3.5–5.1)
Sodium: 128 mmol/L — ABNORMAL LOW (ref 135–145)
Total Bilirubin: 0.4 mg/dL (ref 0.3–1.2)
Total Protein: 6.6 g/dL (ref 6.5–8.1)

## 2019-06-16 SURGERY — MRI WITH ANESTHESIA
Anesthesia: General

## 2019-06-16 MED ORDER — LIDOCAINE 2% (20 MG/ML) 5 ML SYRINGE
INTRAMUSCULAR | Status: DC | PRN
  Administered 2019-06-16: 40 mg via INTRAVENOUS

## 2019-06-16 MED ORDER — PROPOFOL 10 MG/ML IV BOLUS
INTRAVENOUS | Status: DC | PRN
  Administered 2019-06-16: 130 mg via INTRAVENOUS

## 2019-06-16 MED ORDER — SUCCINYLCHOLINE CHLORIDE 200 MG/10ML IV SOSY
PREFILLED_SYRINGE | INTRAVENOUS | Status: DC | PRN
  Administered 2019-06-16: 100 mg via INTRAVENOUS

## 2019-06-16 MED ORDER — ONDANSETRON HCL 4 MG/2ML IJ SOLN
INTRAMUSCULAR | Status: DC | PRN
  Administered 2019-06-16: 4 mg via INTRAVENOUS

## 2019-06-16 MED ORDER — GADOBUTROL 1 MMOL/ML IV SOLN
4.0000 mL | Freq: Once | INTRAVENOUS | Status: AC | PRN
  Administered 2019-06-16: 4 mL via INTRAVENOUS

## 2019-06-16 MED ORDER — FENTANYL CITRATE (PF) 100 MCG/2ML IJ SOLN
INTRAMUSCULAR | Status: DC | PRN
  Administered 2019-06-16: 50 ug via INTRAVENOUS

## 2019-06-16 MED ORDER — DEXAMETHASONE SODIUM PHOSPHATE 10 MG/ML IJ SOLN
INTRAMUSCULAR | Status: DC | PRN
  Administered 2019-06-16: 4 mg via INTRAVENOUS

## 2019-06-16 MED ORDER — PHENYLEPHRINE 40 MCG/ML (10ML) SYRINGE FOR IV PUSH (FOR BLOOD PRESSURE SUPPORT)
PREFILLED_SYRINGE | INTRAVENOUS | Status: DC | PRN
  Administered 2019-06-16 (×2): 120 ug via INTRAVENOUS

## 2019-06-16 MED ORDER — EPHEDRINE SULFATE-NACL 50-0.9 MG/10ML-% IV SOSY
PREFILLED_SYRINGE | INTRAVENOUS | Status: DC | PRN
  Administered 2019-06-16: 10 mg via INTRAVENOUS

## 2019-06-16 MED ORDER — SODIUM CHLORIDE 0.9 % IV SOLN
INTRAVENOUS | Status: DC | PRN
  Administered 2019-06-16: 50 ug/min via INTRAVENOUS

## 2019-06-16 MED ORDER — MIDAZOLAM HCL 2 MG/2ML IJ SOLN
INTRAMUSCULAR | Status: DC | PRN
  Administered 2019-06-16: 1 mg via INTRAVENOUS

## 2019-06-16 MED ORDER — LACTATED RINGERS IV SOLN
INTRAVENOUS | Status: DC
  Administered 2019-06-16: 09:00:00 via INTRAVENOUS

## 2019-06-16 NOTE — Anesthesia Preprocedure Evaluation (Signed)
Anesthesia Evaluation  Patient identified by MRN, date of birth, ID band Patient awake    Reviewed: Allergy & Precautions, NPO status , Patient's Chart, lab work & pertinent test results  History of Anesthesia Complications Negative for: history of anesthetic complications  Airway Mallampati: I  TM Distance: >3 FB Neck ROM: Full    Dental  (+) Teeth Intact, Dental Advisory Given,    Pulmonary shortness of breath, asthma , COPD,  COPD inhaler, Current SmokerPatient did not abstain from smoking., former smoker,  Met NSCLC          Cardiovascular hypertension,      Neuro/Psych    GI/Hepatic   Endo/Other    Renal/GU      Musculoskeletal   Abdominal   Peds  Hematology   Anesthesia Other Findings mediastinal, abdominal, adrenal metastasis lymphadenopathy  Reproductive/Obstetrics                             Anesthesia Physical Anesthesia Plan  ASA: IV  Anesthesia Plan: General   Post-op Pain Management:    Induction: Intravenous  PONV Risk Score and Plan: 3 and Dexamethasone and Ondansetron  Airway Management Planned: Oral ETT  Additional Equipment: None  Intra-op Plan:   Post-operative Plan: Extubation in OR  Informed Consent: I have reviewed the patients History and Physical, chart, labs and discussed the procedure including the risks, benefits and alternatives for the proposed anesthesia with the patient or authorized representative who has indicated his/her understanding and acceptance.     Dental advisory given  Plan Discussed with: CRNA and Surgeon  Anesthesia Plan Comments:         Anesthesia Quick Evaluation

## 2019-06-16 NOTE — Transfer of Care (Signed)
Immediate Anesthesia Transfer of Care Note  Patient: Pamela Russell  Procedure(s) Performed: MRI WITH ANESTHESIA OF BRAIN WITH AND WITHOUT CONTRAST (N/A )  Patient Location: PACU  Anesthesia Type:General  Level of Consciousness: drowsy and patient cooperative  Airway & Oxygen Therapy: Patient Spontanous Breathing and Patient connected to face mask oxygen  Post-op Assessment: Report given to RN and Post -op Vital signs reviewed and stable  Post vital signs: Reviewed and stable  Last Vitals:  Vitals Value Taken Time  BP    Temp    Pulse    Resp    SpO2      Last Pain:  Vitals:   06/16/19 0810  TempSrc: Oral         Complications: No apparent anesthesia complications

## 2019-06-16 NOTE — Telephone Encounter (Signed)
Returned patient's phone call regarding rescheduling an appointment, informed patient she will receive a call back regarding this request. Sent patient request to Mont Dutton.

## 2019-06-17 ENCOUNTER — Inpatient Hospital Stay: Payer: Medicare Other

## 2019-06-18 ENCOUNTER — Inpatient Hospital Stay: Payer: Medicare Other | Admitting: Internal Medicine

## 2019-06-18 ENCOUNTER — Encounter (HOSPITAL_COMMUNITY): Payer: Self-pay | Admitting: Radiology

## 2019-06-19 ENCOUNTER — Other Ambulatory Visit: Payer: Self-pay | Admitting: *Deleted

## 2019-06-19 ENCOUNTER — Other Ambulatory Visit: Payer: Self-pay

## 2019-06-19 ENCOUNTER — Inpatient Hospital Stay (HOSPITAL_BASED_OUTPATIENT_CLINIC_OR_DEPARTMENT_OTHER): Payer: Medicare Other | Admitting: Internal Medicine

## 2019-06-19 DIAGNOSIS — C7949 Secondary malignant neoplasm of other parts of nervous system: Secondary | ICD-10-CM | POA: Diagnosis not present

## 2019-06-19 DIAGNOSIS — C7931 Secondary malignant neoplasm of brain: Secondary | ICD-10-CM

## 2019-06-19 DIAGNOSIS — Z5112 Encounter for antineoplastic immunotherapy: Secondary | ICD-10-CM | POA: Diagnosis not present

## 2019-06-19 NOTE — Progress Notes (Signed)
[facility] [place], [place] [address] [phone]   Interval Evaluation  Date of Service: 06/19/19 Patient Name: Pamela Russell Patient MRN: [id number] Patient DOB: [birth year] Provider: Ventura Sellers, MD  Identifying Statement:  Pamela Russell is a 73 y.o. 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  Primary Cancer Oncologic History: Oncology History  Small cell lung cancer, right lower lobe ([place])  02/19/2019 Initial Diagnosis   Small cell lung cancer, right lower lobe ([place])   03/02/2019 -  Chemotherapy   The patient had palonosetron (ALOXI) injection 0.25 mg, 0.25 mg, Intravenous,  Once, 4 of 4 cycles Administration: 0.25 mg (03/02/2019), 0.25 mg (05/04/2019), 0.25 mg (03/23/2019), 0.25 mg (04/13/2019) pegfilgrastim-jmdb (FULPHILA) injection 6 mg, 6 mg, Subcutaneous,  Once, 1 of 1 cycle pegfilgrastim-cbqv (UDENYCA) injection 6 mg, 6 mg, Subcutaneous, Once, 4 of 4 cycles Administration: 6 mg (03/06/2019), 6 mg (03/27/2019), 6 mg (05/08/2019), 6 mg (04/17/2019) CARBOplatin (PARAPLATIN) 310 mg in sodium chloride 0.9 % 250 mL chemo infusion, 310 mg (100 % of original dose 306.5 mg), Intravenous,  Once, 4 of 4 cycles Dose modification: 306.5 mg (original dose 306.5 mg, Cycle 1), 306.5 mg (original dose 306.5 mg, Cycle 4), 306.5 mg (original dose 306.5 mg, Cycle 2), 306.5 mg (original dose 306.5 mg, Cycle 3),   (original dose 306.5 mg, Cycle 4) Administration: 310 mg (03/02/2019), 310 mg (03/23/2019), 310 mg (04/13/2019), 310 mg (05/04/2019) etoposide (VEPESID) 140 mg in sodium chloride 0.9 % 500 mL chemo infusion, 100 mg/m2 = 140 mg, Intravenous,  Once, 4 of 4 cycles Administration: 140 mg (03/02/2019), 140 mg (03/03/2019), 140 mg (03/04/2019), 140 mg (05/04/2019), 140 mg (05/05/2019), 140 mg (05/06/2019), 140 mg (03/23/2019), 140 mg (03/24/2019), 140 mg (03/25/2019), 140 mg (04/13/2019), 140 mg (04/14/2019), 140 mg (04/15/2019) fosaprepitant (EMEND) 150 mg, dexamethasone  (DECADRON) 12 mg in sodium chloride 0.9 % 145 mL IVPB, , Intravenous,  Once, 4 of 4 cycles Administration:  (03/02/2019),  (05/04/2019),  (03/23/2019),  (04/13/2019) durvalumab (IMFINZI) 1,500 mg in sodium chloride 0.9 % 100 mL chemo infusion, 1,500 mg, Intravenous,  Once, 5 of 10 cycles Administration: 1,500 mg (03/02/2019), 1,500 mg (05/04/2019), 1,500 mg (05/25/2019), 1,500 mg (03/23/2019), 1,500 mg (04/13/2019)  for chemotherapy treatment.      Interval History:  Pamela Russell presents for follow up today after recent MRI brain.  She describes no new or progressive neurologic deficits.  Double vision has improved and is no longer noticed.  Balance is still affected as prior, needs to hold on to railing/wall on occasion but not worse from before.  No falls.  Denies seizures or headaches.  Tolerating Imfinzi without issue.  H+P (04/10/19) Patient presents today to discuss visual impairment in context of abnormalities on recent brain imaging.  She describes gradual onset of double vision, described as "objects side by side or diagonal".  This probably began in the spring prior to diagnosis of small cell lung cancer and subsequent treatments, and has progressed somewhat.  Double vision is clearly worsened by looking "close in".  She also has noticed some drooping of the left eyelid, as well as twitching of the eyelid.  She is not sure if these symptoms have progressed as well.  She has met with radiation oncology for plans to treat suspected brain met from lung cancer.  Overall her chemotherapy has not been well tolerated, causing weight loss, low energy, poor appetite and hair loss.  Was intially on decadron for brain met  though this has been weaned off. Describes balance as "poor" she sometimes needs a walker to ambulate.    Medications: Current Outpatient Medications on File Prior to Visit  Medication Sig Dispense Refill   albuterol (VENTOLIN HFA) 108 (90 Base) MCG/ACT inhaler USE 2 INHALATIONS BY MOUTH   EVERY 6 HOURS AS NEEDED FOR WHEEZING OR SHORTNESS OF  BREATH (Patient taking differently: Inhale 2 puffs into the lungs every 4 (four) hours as needed (wheezing/shortness of breath.). ) 17 g 2   aspirin EC 81 MG tablet Take 81 mg by mouth daily.     azelastine (ASTELIN) 0.1 % nasal spray Place 1 spray into both nostrils 2 (two) times daily. Use in each nostril as directed (Patient not taking: Reported on 06/10/2019) 30 mL 1   cetirizine (ZYRTEC) 10 MG tablet Take 10 mg by mouth daily.     doxycycline (VIBRA-TABS) 100 MG tablet Take 1 tablet (100 mg total) by mouth 2 (two) times daily. (Patient not taking: Reported on 06/10/2019) 14 tablet 0   fluticasone (FLONASE) 50 MCG/ACT nasal spray USE 2 SPRAYS IN BOTH  NOSTRILS DAILY (Patient taking differently: Place 2 sprays into both nostrils daily. ) 32 g 1   Fluticasone-Umeclidin-Vilant (TRELEGY ELLIPTA) 100-62.5-25 MCG/INH AEPB Inhale 1 puff into the lungs daily. 60 each 3   glucosamine-chondroitin 500-400 MG tablet Take 1 tablet by mouth daily.      guaiFENesin (MUCINEX) 600 MG 12 hr tablet Take 600 mg by mouth 2 (two) times daily.     ibuprofen (ADVIL) 200 MG tablet Take 400 mg by mouth every 6 (six) hours as needed for moderate pain.     lidocaine-prilocaine (EMLA) cream Apply 1 application topically daily as needed (port).      mometasone (ELOCON) 0.1 % lotion Apply 1 application topically daily. Applied to ears daily     Multiple Vitamin (MULTIVITAMIN WITH MINERALS) TABS tablet Take 1 tablet by mouth daily.     prochlorperazine (COMPAZINE) 10 MG tablet TAKE 1 TABLET BY MOUTH EVERY 6 HOURS AS NEEDED FOR NAUSEA AND VOMITING (Patient taking differently: Take 10 mg by mouth every 6 (six) hours as needed for nausea or vomiting. ) 30 tablet 0   sodium chloride (OCEAN) 0.65 % SOLN nasal spray Place 1 spray into both nostrils as needed for congestion.     traZODone (DESYREL) 50 MG tablet Take 1 tablet (50 mg total) by mouth at bedtime as needed  for sleep. (Patient not taking: Reported on 06/10/2019) 30 tablet 0   [DISCONTINUED] prochlorperazine (COMPAZINE) 10 MG tablet Take 1 tablet (10 mg total) by mouth every 6 (six) hours as needed for nausea or vomiting. 30 tablet 0   No current facility-administered medications on file prior to visit.     Allergies:  Allergies  Allergen Reactions   Codeine Anaphylaxis and Swelling   Penicillins Anaphylaxis and Swelling    Childhood reaction. Has patient had a PCN reaction causing immediate rash, facial/tongue/throat swelling, SOB or lightheadedness with hypotension: Yes Has patient had a PCN reaction causing severe rash involving mucus membranes or skin necrosis: No Has patient had a PCN reaction that required hospitalization: No Has patient had a PCN reaction occurring within the last 10 years: No If all of the above answers are "NO", then may proceed with Cephalosporin use.    Anoro Ellipta [Umeclidinium-Vilanterol] Nausea And Vomiting    Severe    Past Medical History:  Past Medical History:  Diagnosis Date   Allergic rhinitis    Anxiety  panic attack 02/2019- seen in ED - it presented as chest pain   Asthma 2008   Chronic obstructive pulmonary disease (COPD) ([place]) 06/08/2016   Meds: Spiriva, albuterol inhaler PFT (11/29/2016): Moderate-severe obstruction by PFT 11/2016   Dyspnea 06/15/2019   with walking - pulse ox- stay 94- 97%   Ear itching 07/05/2017   Grieving 06/08/2016   History of pneumonia    x4   Hyperlipidemia 09/10/2016   On atorvastatin 40 mg QD, ASA 81 mg QD ASCVD risk 17.1%, mod-high intensity statin Risk factors: FHx sig for MI: mother 45s-50s, and father age 88, HTN, smoker   Hypertension    no problem   Night sweats 06/08/2016   NSCL Ca dx'd 05/2018   xrt comp 09/2018- Lung has had radiotion and chemo   Pneumonia     3 times last time 2017 ish   Pulmonary nodules 06/12/2017   Tobacco use disorder 07/06/2016   On nicotine replacement  patch. Smoking cigs since age 75. Has 60 pack year history. Has COPD dx on SABA and LAMA.   Past Surgical History:  Past Surgical History:  Procedure Laterality Date   CATARACT EXTRACTION Left 05/08/2016   CATARACT EXTRACTION Right 05/22/2016   IR IMAGING GUIDED PORT INSERTION  02/25/2019   RADIOLOGY WITH ANESTHESIA N/A 02/13/2019   Procedure: BRAIN MRI;  Surgeon: Radiologist, Medication, MD;  Location: [facility];  Service: Radiology;  Laterality: N/A;   RADIOLOGY WITH ANESTHESIA N/A 06/16/2019   Procedure: MRI WITH ANESTHESIA OF BRAIN WITH AND WITHOUT CONTRAST;  Surgeon: Radiologist, Medication, MD;  Location: [facility];  Service: Radiology;  Laterality: N/A;   TOTAL ABDOMINAL HYSTERECTOMY W/ BILATERAL SALPINGOOPHORECTOMY Bilateral 1975   VIDEO BRONCHOSCOPY WITH ENDOBRONCHIAL NAVIGATION Left 07/09/2018   Procedure: VIDEO BRONCHOSCOPY WITH ENDOBRONCHIAL NAVIGATION;  Surgeon: Collene Gobble, MD;  Location: [facility];  Service: Thoracic;  Laterality: Left;   Social History:  Social History   Socioeconomic History   Marital status: Widowed    Spouse name: Not on file   Number of children: 2   Years of education: Not on file   Highest education level: Not on file  Occupational History   Not on file  Social Needs   Financial resource strain: Not on file   Food insecurity    Worry: Not on file    Inability: Not on file   Transportation needs    Medical: No    Non-medical: No  Tobacco Use   Smoking status: Former Smoker    Packs/day: 1.00    Years: 60.00    Pack years: 60.00    Types: Cigarettes    Start date: 08/21/1955    Quit date: 01/28/2019    Years since quitting: 0.3   Smokeless tobacco: Never Used   Tobacco comment: 3 ppd for 20+ years, has cut down to 0.5 ppd, in the past 3 months, no cigs in 1 month  Substance and Sexual Activity   Alcohol use: Yes    Alcohol/week: 1.0 standard drinks    Types: 1 Cans of beer per week    Comment: maybe every month   Drug use:  No   Sexual activity: Not on file  Lifestyle   Physical activity    Days per week: Not on file    Minutes per session: Not on file   Stress: Not on file  Relationships   Social connections    Talks on phone: Not on file    Gets together: Not on file  Attends religious service: Not on file    Active member of club or organization: Not on file    Attends meetings of clubs or organizations: Not on file    Relationship status: Not on file   Intimate partner violence    Fear of current or ex partner: Not on file    Emotionally abused: Not on file    Physically abused: Not on file    Forced sexual activity: Not on file  Other Topics Concern   Not on file  Social History Narrative   Not on file   Family History:  Family History  Problem Relation Age of Onset   Heart disease Mother        MI in her 17's   Stroke Mother    Heart disease Father        CABG   Hypertension Father    Heart disease Sister 78       CABG   Stroke Sister    Diabetes Sister     Review of Systems: Constitutional:+weight loss Eyes: Denies blurriness of vision Ears, nose, mouth, throat, and face: Denies mucositis or sore throat Respiratory: Denies cough, dyspnea or wheezes Cardiovascular: Denies palpitation, chest discomfort or lower extremity swelling Gastrointestinal:  +nausea GU: Denies dysuria or incontinence Skin: Denies abnormal skin rashes Neurological: Per HPI Musculoskeletal: Denies joint pain, back or neck discomfort. No decrease in ROM Behavioral/Psych: +anxiety, "jittery"  Physical Exam: Vitals:   06/19/19 1014  BP: 133/72  Pulse: 87  Resp: 17  Temp: 99.1 F (37.3 C)  SpO2: 96%   KPS: 70. General: Alert, cooperative, pleasant, but thin/cachectic Head: Normal EENT: No conjunctival injection or scleral icterus. Oral mucosa moist Lungs: Resp effort normal Cardiac: Regular rate and rhythm Abdomen: Soft, non-distended abdomen Skin: No rashes cyanosis or  petechiae. Extremities: No clubbing or edema  Neurologic Exam: Mental Status: Awake, alert, attentive to examiner. Oriented to self and environment. Language is fluent with intact comprehension.  Cranial Nerves: Visual acuity is grossly normal. Visual fields are full. Extra-ocular movements intact. Face is symmetric, tongue midline. Motor: Tone and bulk are normal. Power is full in both arms and legs. Reflexes are symmetric, no pathologic reflexes present. Intact finger to nose bilaterally Sensory: Intact to light touch and temperature Gait: Normal and tandem gait is deferred  Labs: I have reviewed the data as listed    Component Value Date/Time   NA 128 (L) 06/16/2019 0819   NA 130 (L) 01/26/2019 1153   K 4.3 06/16/2019 0819   CL 92 (L) 06/16/2019 0819   CO2 25 06/16/2019 0819   GLUCOSE 97 06/16/2019 0819   BUN 6 (L) 06/16/2019 0819   BUN 5 (L) 01/26/2019 1153   CREATININE 0.40 (L) 06/16/2019 0819   CREATININE 0.52 05/25/2019 1044   CREATININE 0.69 06/08/2016 1128   CALCIUM 9.7 06/16/2019 0819   PROT 6.6 06/16/2019 0819   PROT 7.1 05/27/2017 1458   ALBUMIN 4.0 06/16/2019 0819   ALBUMIN 4.9 (H) 05/27/2017 1458   AST 18 06/16/2019 0819   AST 16 05/25/2019 1044   ALT 14 06/16/2019 0819   ALT 17 05/25/2019 1044   ALKPHOS 77 06/16/2019 0819   BILITOT 0.4 06/16/2019 0819   BILITOT 0.3 05/25/2019 1044   GFRNONAA >60 06/16/2019 0819   GFRNONAA >60 05/25/2019 1044   GFRNONAA 89 06/08/2016 1128   GFRAA >60 06/16/2019 0819   GFRAA >60 05/25/2019 1044   GFRAA >89 06/08/2016 1128   Lab Results  Component Value  Date   WBC 6.7 06/16/2019   NEUTROABS 10.0 (H) 05/25/2019   HGB 12.9 06/16/2019   HCT 38.3 06/16/2019   MCV 96.7 06/16/2019   PLT 297 06/16/2019    Imaging:  Ct Chest W Contrast  Result Date: 05/22/2019 CLINICAL DATA:  Small cell lung cancer assess response to therapy. Status post to 3 cycles chemotherapy EXAM: CT CHEST, ABDOMEN, AND PELVIS WITH CONTRAST TECHNIQUE:  Multidetector CT imaging of the chest, abdomen and pelvis was performed following the standard protocol during bolus administration of intravenous contrast. CONTRAST:  161m OMNIPAQUE IOHEXOL 300 MG/ML  SOLN COMPARISON:  PET-CT 03/26/2019, CT chest Jan 16, 2019 FINDINGS: CT CHEST FINDINGS Cardiovascular: Coronary artery calcification and aortic atherosclerotic calcification. Mediastinum/Nodes: No axillary lymphadenopathy. Resolution of LEFT supraclavicular lymphadenopathy. Enlarged lymph nodes previously seen in the posterior mediastinum along the descending thoracic aorta near completely resolved. Example scant nodal tissue measuring 4 mm in thickness (image 56/2) decreased from 12 mm on prior. No new adenopathy. Lungs/Pleura: Reticulonodular peribronchial thickening in the RIGHT upper lobe measuring 6 mm is similar to 6 mm on prior (image 50/6). More inferiorly in the RIGHT upper lobe sub solid nodule measuring 16 mm compares to 18 mm (image 57/6). No new nodularity LEFT or RIGHT lung. Musculoskeletal: No aggressive osseous lesion. CT ABDOMEN AND PELVIS FINDINGS Hepatobiliary: No focal hepatic lesion. No biliary ductal dilatation. Gallbladder is normal. Common bile duct is normal. Pancreas: Pancreas is normal. No ductal dilatation. No pancreatic inflammation. Spleen: Normal spleen Adrenals/urinary tract: Adrenal glands and kidneys are normal. The ureters and bladder normal. Stomach/Bowel: Stomach, small bowel, appendix, and cecum are normal. The colon and rectosigmoid colon are normal. Vascular/Lymphatic: Abdominal aorta normal caliber. No calcifications present. Near complete resolution of the periaortic retroperitoneum adenopathy seen on comparison exam. Scant tissue remains. For example 4 mm tissue on image 79/2 compares to a 7 mm thick node. Nodes along the iliac chains have also resolved. No pelvic lymphadenopathy Reproductive: Post hysterectomy Other: No free-fluid.  No peritoneal carcinomatosis  Musculoskeletal: Compression fracture in the upper thoracic spine at T5 is new from CT 01/16/2019. Near complete loss vertebral body height at this level. Mild superior endplate compression fracture of T7 with approximately 25% loss of superior endplate height. IMPRESSION: Chest Impression: 1. Near complete resolution of mediastinal lymphadenopathy. Remaining scant nodal tissue is barely measurable and not pathologic by size criteria. 2. Stable reticular nodular and sub solid nodules in the RIGHT upper lobe. No interval change. No new nodularity. Abdomen / Pelvis Impression: 1. Near complete resolution of Retroperitoneal nodes and iliac lymph nodes. Remaining lymph nodes are less than 5 mm and not pathologic by size criteria. 2. Severe compression fracture at T5 and mild compression fracture at T7. Compression fractures occurred between June and August 2020 by radiograph comparison. 3. Aortic Atherosclerosis (ICD10-I70.0). Electronically Signed   By: SSuzy BouchardM.D.   On: 05/22/2019 17:04   Mr BJeri CosWTGContrast  Result Date: 06/16/2019 CLINICAL DATA:  Metastatic lung cancer. Treatment planning for brain metastasis. EXAM: MRI HEAD WITHOUT AND WITH CONTRAST TECHNIQUE: Multiplanar, multiecho pulse sequences of the brain and surrounding structures were obtained without and with intravenous contrast. CONTRAST:  472mGADAVIST GADOBUTROL 1 MMOL/ML IV SOLN COMPARISON:  MRI head 02/13/2019 FINDINGS: Brain: Abnormal right temporal lobe with cortical thickening and abnormal white matter compatible with cortical dysplasia unchanged from the prior study. Interval development of numerous new enhancing lesions the brain compatible with widespread metastatic disease. Many of these also show restricted diffusion.  Multiple lesions in the cerebellum bilaterally most compatible with leptomeningeal spread. Small lesion along the left lateral medulla. Lesions are present along the wall of the fourth ventricle on the right.  Mild diffuse enhancement of the eighth cranial nerve on the right likely due to leptomeningeal spread of tumor. Multiple enhancing nodules along the lateral ventricles and third ventricle. Enhancing lesion along the right temporal horn. Cluster of 5 mm lesions in the right gyrus rectus. Multiple enhancing lesions along the course of the corpus callosum. 8 mm enhancing lesion left frontal lobe Multiple enhancing lesions in the left anterior temporal lobe and left medial temporal lobe. 6 mm suprasellar lesion with minimal enhancement presumably leptomeningeal tumor. Negative for hydrocephalus Vascular: Normal arterial flow voids Skull and upper cervical spine: Small enhancing calvarial lesions in the parietal bone bilaterally stable and of uncertain etiology. Sinuses/Orbits: Mucosal edema left maxillary sinus.  Bilateral cataract surgery Other: None IMPRESSION: Marked progression of metastatic disease. 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 Left frontal parenchymal tumor. Suprasellar mass likely leptomeningeal spread of tumor. Cortical dysplasia right temporal lobe Electronically Signed   By: Franchot Gallo M.D.   On: 06/16/2019 15:03   Ct Abdomen Pelvis W Contrast  Result Date: 05/22/2019 CLINICAL DATA:  Small cell lung cancer assess response to therapy. Status post to 3 cycles chemotherapy EXAM: CT CHEST, ABDOMEN, AND PELVIS WITH CONTRAST TECHNIQUE: Multidetector CT imaging of the chest, abdomen and pelvis was performed following the standard protocol during bolus administration of intravenous contrast. CONTRAST:  172m OMNIPAQUE IOHEXOL 300 MG/ML  SOLN COMPARISON:  PET-CT 03/26/2019, CT chest Jan 16, 2019 FINDINGS: CT CHEST FINDINGS Cardiovascular: Coronary artery calcification and aortic atherosclerotic calcification. Mediastinum/Nodes: No axillary lymphadenopathy. Resolution of LEFT supraclavicular lymphadenopathy.  Enlarged lymph nodes previously seen in the posterior mediastinum along the descending thoracic aorta near completely resolved. Example scant nodal tissue measuring 4 mm in thickness (image 56/2) decreased from 12 mm on prior. No new adenopathy. Lungs/Pleura: Reticulonodular peribronchial thickening in the RIGHT upper lobe measuring 6 mm is similar to 6 mm on prior (image 50/6). More inferiorly in the RIGHT upper lobe sub solid nodule measuring 16 mm compares to 18 mm (image 57/6). No new nodularity LEFT or RIGHT lung. Musculoskeletal: No aggressive osseous lesion. CT ABDOMEN AND PELVIS FINDINGS Hepatobiliary: No focal hepatic lesion. No biliary ductal dilatation. Gallbladder is normal. Common bile duct is normal. Pancreas: Pancreas is normal. No ductal dilatation. No pancreatic inflammation. Spleen: Normal spleen Adrenals/urinary tract: Adrenal glands and kidneys are normal. The ureters and bladder normal. Stomach/Bowel: Stomach, small bowel, appendix, and cecum are normal. The colon and rectosigmoid colon are normal. Vascular/Lymphatic: Abdominal aorta normal caliber. No calcifications present. Near complete resolution of the periaortic retroperitoneum adenopathy seen on comparison exam. Scant tissue remains. For example 4 mm tissue on image 79/2 compares to a 7 mm thick node. Nodes along the iliac chains have also resolved. No pelvic lymphadenopathy Reproductive: Post hysterectomy Other: No free-fluid.  No peritoneal carcinomatosis Musculoskeletal: Compression fracture in the upper thoracic spine at T5 is new from CT 01/16/2019. Near complete loss vertebral body height at this level. Mild superior endplate compression fracture of T7 with approximately 25% loss of superior endplate height. IMPRESSION: Chest Impression: 1. Near complete resolution of mediastinal lymphadenopathy. Remaining scant nodal tissue is barely measurable and not pathologic by size criteria. 2. Stable reticular nodular and sub solid nodules  in the RIGHT upper lobe. No interval  change. No new nodularity. Abdomen / Pelvis Impression: 1. Near complete resolution of Retroperitoneal nodes and iliac lymph nodes. Remaining lymph nodes are less than 5 mm and not pathologic by size criteria. 2. Severe compression fracture at T5 and mild compression fracture at T7. Compression fractures occurred between June and August 2020 by radiograph comparison. 3. Aortic Atherosclerosis (ICD10-I70.0). Electronically Signed   By: Suzy Bouchard M.D.   On: 05/22/2019 17:04    [facility] Clinician Interpretation: I have personally reviewed the CNS specific radiological images as listed.  My interpretation, in the context of the patient's clinical presentation, is progression in leptomeningeal pattern   Assessment/Plan Leptomeningeal metastases (HCC) - Plan: MR TOTAL SPINE W WO CONTRAST  Ms. Hoover Browns is clinically stable but unfortunately presents with radiographic picture c/w leptomeningeal deposits secondary to small cell carcinoma.  There is a predominance of nodular features on imaging.    We extensively discussed clinical significant and treatment pathways for LMD.    Recommendations: -Obtain total spine MRI for metastatic screening -Referral to radiation oncology for whole brain radiation +/- craniospinal therapy  She understands the potential risks of radiation treatment including cognitive impairment focal inflammation.  Despite radiosensitivity of small cell carcinoma this therapy is not considered curative and has potential for early relapse.     We appreciate the opportunity to participate in the care of LITHZY BERNARD.  We will arrange for total spine MRI with general anesthesia to complete workup, and discuss plans with Dr. Ida Rogue team.    All questions were answered. The patient knows to call the clinic with any problems, questions or concerns. No barriers to learning were detected.  The total time spent in the encounter was 40 minutes and  more than 50% was on counseling and review of test results   Ventura Sellers, MD Medical Director of Neuro-Oncology [facility] at [place] 06/19/19 10:09 AM

## 2019-06-22 ENCOUNTER — Inpatient Hospital Stay: Payer: Medicare Other

## 2019-06-22 ENCOUNTER — Other Ambulatory Visit: Payer: Self-pay | Admitting: Radiation Oncology

## 2019-06-22 ENCOUNTER — Inpatient Hospital Stay: Payer: Medicare Other | Attending: Internal Medicine

## 2019-06-22 ENCOUNTER — Telehealth: Payer: Self-pay | Admitting: Radiation Oncology

## 2019-06-22 ENCOUNTER — Inpatient Hospital Stay: Payer: Medicare Other | Admitting: Nutrition

## 2019-06-22 ENCOUNTER — Inpatient Hospital Stay (HOSPITAL_BASED_OUTPATIENT_CLINIC_OR_DEPARTMENT_OTHER): Payer: Medicare Other | Admitting: Internal Medicine

## 2019-06-22 ENCOUNTER — Other Ambulatory Visit: Payer: Self-pay

## 2019-06-22 ENCOUNTER — Encounter: Payer: Self-pay | Admitting: Internal Medicine

## 2019-06-22 VITALS — BP 148/73 | HR 78 | Temp 98.5°F | Resp 18 | Ht 64.0 in | Wt 83.3 lb

## 2019-06-22 DIAGNOSIS — J45909 Unspecified asthma, uncomplicated: Secondary | ICD-10-CM | POA: Diagnosis not present

## 2019-06-22 DIAGNOSIS — Z95828 Presence of other vascular implants and grafts: Secondary | ICD-10-CM

## 2019-06-22 DIAGNOSIS — C7931 Secondary malignant neoplasm of brain: Secondary | ICD-10-CM | POA: Insufficient documentation

## 2019-06-22 DIAGNOSIS — Z90722 Acquired absence of ovaries, bilateral: Secondary | ICD-10-CM | POA: Insufficient documentation

## 2019-06-22 DIAGNOSIS — F419 Anxiety disorder, unspecified: Secondary | ICD-10-CM | POA: Diagnosis not present

## 2019-06-22 DIAGNOSIS — Z90710 Acquired absence of both cervix and uterus: Secondary | ICD-10-CM | POA: Insufficient documentation

## 2019-06-22 DIAGNOSIS — Z7982 Long term (current) use of aspirin: Secondary | ICD-10-CM | POA: Insufficient documentation

## 2019-06-22 DIAGNOSIS — Z7951 Long term (current) use of inhaled steroids: Secondary | ICD-10-CM | POA: Insufficient documentation

## 2019-06-22 DIAGNOSIS — Z791 Long term (current) use of non-steroidal anti-inflammatories (NSAID): Secondary | ICD-10-CM | POA: Diagnosis not present

## 2019-06-22 DIAGNOSIS — Z5112 Encounter for antineoplastic immunotherapy: Secondary | ICD-10-CM | POA: Insufficient documentation

## 2019-06-22 DIAGNOSIS — Z9079 Acquired absence of other genital organ(s): Secondary | ICD-10-CM | POA: Insufficient documentation

## 2019-06-22 DIAGNOSIS — C3411 Malignant neoplasm of upper lobe, right bronchus or lung: Secondary | ICD-10-CM | POA: Diagnosis present

## 2019-06-22 DIAGNOSIS — C3431 Malignant neoplasm of lower lobe, right bronchus or lung: Secondary | ICD-10-CM

## 2019-06-22 DIAGNOSIS — J449 Chronic obstructive pulmonary disease, unspecified: Secondary | ICD-10-CM | POA: Insufficient documentation

## 2019-06-22 DIAGNOSIS — Z79899 Other long term (current) drug therapy: Secondary | ICD-10-CM | POA: Insufficient documentation

## 2019-06-22 DIAGNOSIS — C7949 Secondary malignant neoplasm of other parts of nervous system: Secondary | ICD-10-CM

## 2019-06-22 LAB — TSH: TSH: 1.128 u[IU]/mL (ref 0.308–3.960)

## 2019-06-22 MED ORDER — HEPARIN SOD (PORK) LOCK FLUSH 100 UNIT/ML IV SOLN
500.0000 [IU] | Freq: Once | INTRAVENOUS | Status: AC | PRN
  Administered 2019-06-22: 500 [IU]
  Filled 2019-06-22: qty 5

## 2019-06-22 MED ORDER — SODIUM CHLORIDE 0.9 % IV SOLN
Freq: Once | INTRAVENOUS | Status: AC
  Administered 2019-06-22: 11:00:00 via INTRAVENOUS
  Filled 2019-06-22: qty 250

## 2019-06-22 MED ORDER — LORAZEPAM 0.5 MG PO TABS: ORAL_TABLET | ORAL | 0 refills | Status: AC

## 2019-06-22 MED ORDER — SODIUM CHLORIDE 0.9% FLUSH
10.0000 mL | INTRAVENOUS | Status: DC | PRN
  Administered 2019-06-22: 13:00:00 10 mL
  Filled 2019-06-22: qty 10

## 2019-06-22 MED ORDER — SODIUM CHLORIDE 0.9% FLUSH
10.0000 mL | Freq: Once | INTRAVENOUS | Status: AC
  Administered 2019-06-22: 10 mL
  Filled 2019-06-22: qty 10

## 2019-06-22 MED ORDER — SODIUM CHLORIDE 0.9 % IV SOLN
1500.0000 mg | Freq: Once | INTRAVENOUS | Status: AC
  Administered 2019-06-22: 1500 mg via INTRAVENOUS
  Filled 2019-06-22: qty 30

## 2019-06-22 NOTE — Progress Notes (Signed)
[facility] [phone]   Fax:([phone]) [place], [place], [place] [address]  DIAGNOSIS:  1) Extensive stage small cell lung cancer presenting with mediastinal lymphadenopathy in addition to abdominal lymphadenopathy as well as adrenal metastasis diagnosed in June 2020. 2)  History of bilateral adenocarcinoma of the lung presenting with right upper lobe and left upper lobe pulmonary nodules  PRIOR THERAPY:  1) status post curative SBRT completed in January 2020. 2) Systemic chemotherapy with carboplatin for AUC of 5 on day 1, etoposide 100 mg/M2 and Imfinzi 1500 mg IV every 3 weeks.  Status post 4 cycles. 3) whole brain irradiation under the care of Dr. Lisbeth Renshaw.  CURRENT THERAPY: Maintenance treatment with Imfinzi 1500 mg IV every 4 weeks.  First dose May 25, 2019.  Status post 1 cycle.  INTERVAL HISTORY: Pamela Russell 73 y.o. female returns to the clinic today for follow-up visit.  The patient is feeling fine today with no concerning complaints except for fatigue.  She tolerated the first cycle of her maintenance treatment with Imfinzi fairly well except for fatigue for few days.  She denied having any current chest pain, shortness of breath, cough or hemoptysis.  She denied having any fever or chills.  She has no nausea, vomiting, diarrhea or constipation.  She has no headache or visual changes.  She was found on a recent MRI of the brain to have multiple brain metastasis and the patient was seen by Dr. Lisbeth Renshaw and expected to start whole brain irradiation soon.   MEDICAL HISTORY: Past Medical History:  Diagnosis Date   Allergic rhinitis    Anxiety    panic attack 02/2019- seen in ED - it presented as chest pain   Asthma 2008   Chronic obstructive pulmonary disease (COPD) ([facility]) 06/08/2016   Meds: Spiriva, albuterol inhaler PFT (11/29/2016): Moderate-severe obstruction by PFT 11/2016   Dyspnea  06/15/2019   with walking - pulse ox- stay 94- 97%   Ear itching 07/05/2017   Grieving 06/08/2016   History of pneumonia    x4   Hyperlipidemia 09/10/2016   On atorvastatin 40 mg QD, ASA 81 mg QD ASCVD risk 17.1%, mod-high intensity statin Risk factors: FHx sig for MI: mother 49s-50s, and father age 80, HTN, smoker   Hypertension    no problem   Night sweats 06/08/2016   NSCL Ca dx'd 05/2018   xrt comp 09/2018- Lung has had radiotion and chemo   Pneumonia     3 times last time 2017 ish   Pulmonary nodules 06/12/2017   Tobacco use disorder 07/06/2016   On nicotine replacement patch. Smoking cigs since age 22. Has 60 pack year history. Has COPD dx on SABA and LAMA.    ALLERGIES:  is allergic to codeine; penicillins; and anoro ellipta [umeclidinium-vilanterol].  MEDICATIONS:  Current Outpatient Medications  Medication Sig Dispense Refill   albuterol (VENTOLIN HFA) 108 (90 Base) MCG/ACT inhaler USE 2 INHALATIONS BY MOUTH  EVERY 6 HOURS AS NEEDED FOR WHEEZING OR SHORTNESS OF  BREATH (Patient taking differently: Inhale 2 puffs into the lungs every 4 (four) hours as needed (wheezing/shortness of breath.). ) 17 g 2   aspirin EC 81 MG tablet Take 81 mg by mouth daily.     azelastine (ASTELIN) 0.1 % nasal spray Place 1 spray into both nostrils 2 (two) times daily. Use in each nostril as directed (Patient not taking: Reported on 06/10/2019) 30 mL  1   cetirizine (ZYRTEC) 10 MG tablet Take 10 mg by mouth daily.     doxycycline (VIBRA-TABS) 100 MG tablet Take 1 tablet (100 mg total) by mouth 2 (two) times daily. (Patient not taking: Reported on 06/10/2019) 14 tablet 0   fluticasone (FLONASE) 50 MCG/ACT nasal spray USE 2 SPRAYS IN BOTH  NOSTRILS DAILY (Patient taking differently: Place 2 sprays into both nostrils daily. ) 32 g 1   Fluticasone-Umeclidin-Vilant (TRELEGY ELLIPTA) 100-62.5-25 MCG/INH AEPB Inhale 1 puff into the lungs daily. 60 each 3   glucosamine-chondroitin 500-400 MG  tablet Take 1 tablet by mouth daily.      guaiFENesin (MUCINEX) 600 MG 12 hr tablet Take 600 mg by mouth 2 (two) times daily.     ibuprofen (ADVIL) 200 MG tablet Take 400 mg by mouth every 6 (six) hours as needed for moderate pain.     lidocaine-prilocaine (EMLA) cream Apply 1 application topically daily as needed (port).      LORazepam (ATIVAN) 0.5 MG tablet 1 tab po q 4-6 hours prn or 1 tab po 30 minutes prior to radiation 30 tablet 0   mometasone (ELOCON) 0.1 % lotion Apply 1 application topically daily. Applied to ears daily     Multiple Vitamin (MULTIVITAMIN WITH MINERALS) TABS tablet Take 1 tablet by mouth daily.     prochlorperazine (COMPAZINE) 10 MG tablet TAKE 1 TABLET BY MOUTH EVERY 6 HOURS AS NEEDED FOR NAUSEA AND VOMITING (Patient taking differently: Take 10 mg by mouth every 6 (six) hours as needed for nausea or vomiting. ) 30 tablet 0   sodium chloride (OCEAN) 0.65 % SOLN nasal spray Place 1 spray into both nostrils as needed for congestion.     traZODone (DESYREL) 50 MG tablet Take 1 tablet (50 mg total) by mouth at bedtime as needed for sleep. (Patient not taking: Reported on 06/10/2019) 30 tablet 0   No current facility-administered medications for this visit.     SURGICAL HISTORY:  Past Surgical History:  Procedure Laterality Date   CATARACT EXTRACTION Left 05/08/2016   CATARACT EXTRACTION Right 05/22/2016   IR IMAGING GUIDED PORT INSERTION  02/25/2019   RADIOLOGY WITH ANESTHESIA N/A 02/13/2019   Procedure: BRAIN MRI;  Surgeon: Radiologist, Medication, MD;  Location: [facility];  Service: Radiology;  Laterality: N/A;   RADIOLOGY WITH ANESTHESIA N/A 06/16/2019   Procedure: MRI WITH ANESTHESIA OF BRAIN WITH AND WITHOUT CONTRAST;  Surgeon: Radiologist, Medication, MD;  Location: [facility];  Service: Radiology;  Laterality: N/A;   TOTAL ABDOMINAL HYSTERECTOMY W/ BILATERAL SALPINGOOPHORECTOMY Bilateral 1975   VIDEO BRONCHOSCOPY WITH ENDOBRONCHIAL NAVIGATION Left 07/09/2018     Procedure: VIDEO BRONCHOSCOPY WITH ENDOBRONCHIAL NAVIGATION;  Surgeon: Collene Gobble, MD;  Location: [facility];  Service: Thoracic;  Laterality: Left;    REVIEW OF SYSTEMS:  A comprehensive review of systems was negative except for: Constitutional: positive for fatigue   PHYSICAL EXAMINATION: General appearance: alert, cooperative, fatigued and no distress Head: Normocephalic, without obvious abnormality, atraumatic Neck: no adenopathy, no JVD, supple, symmetrical, trachea midline and thyroid not enlarged, symmetric, no tenderness/mass/nodules Lymph nodes: Cervical, supraclavicular, and axillary nodes normal. Resp: clear to auscultation bilaterally Back: symmetric, no curvature. ROM normal. No CVA tenderness. Cardio: regular rate and rhythm, S1, S2 normal, no murmur, click, rub or gallop GI: soft, non-tender; bowel sounds normal; no masses,  no organomegaly Extremities: extremities normal, atraumatic, no cyanosis or edema  ECOG PERFORMANCE STATUS: 1 - Symptomatic but completely ambulatory  Blood pressure (!) 148/73, pulse 78, temperature  98.5 F (36.9 C), temperature source Temporal, resp. rate 18, height 5\' 4"  (1.626 m), weight 83 lb 4.8 oz (37.8 kg), SpO2 100 %.  LABORATORY DATA: Lab Results  Component Value Date   WBC 6.7 06/16/2019   HGB 12.9 06/16/2019   HCT 38.3 06/16/2019   MCV 96.7 06/16/2019   PLT 297 06/16/2019      Chemistry      Component Value Date/Time   NA 128 (L) 06/16/2019 0819   NA 130 (L) 01/26/2019 1153   K 4.3 06/16/2019 0819   CL 92 (L) 06/16/2019 0819   CO2 25 06/16/2019 0819   BUN 6 (L) 06/16/2019 0819   BUN 5 (L) 01/26/2019 1153   CREATININE 0.40 (L) 06/16/2019 0819   CREATININE 0.52 05/25/2019 1044   CREATININE 0.69 06/08/2016 1128      Component Value Date/Time   CALCIUM 9.7 06/16/2019 0819   ALKPHOS 77 06/16/2019 0819   AST 18 06/16/2019 0819   AST 16 05/25/2019 1044   ALT 14 06/16/2019 0819   ALT 17 05/25/2019 1044   BILITOT 0.4  06/16/2019 0819   BILITOT 0.3 05/25/2019 1044       RADIOGRAPHIC STUDIES: Mr Jeri Cos [place] Contrast  Result Date: 06/16/2019 CLINICAL DATA:  Metastatic lung cancer. Treatment planning for brain metastasis. EXAM: MRI HEAD WITHOUT AND WITH CONTRAST TECHNIQUE: Multiplanar, multiecho pulse sequences of the brain and surrounding structures were obtained without and with intravenous contrast. CONTRAST:  17mL GADAVIST GADOBUTROL 1 MMOL/ML IV SOLN COMPARISON:  MRI head 02/13/2019 FINDINGS: Brain: Abnormal right temporal lobe with cortical thickening and abnormal white matter compatible with cortical dysplasia unchanged from the prior study. Interval development of numerous new enhancing lesions the brain compatible with widespread metastatic disease. Many of these also show restricted diffusion. Multiple lesions in the cerebellum bilaterally most compatible with leptomeningeal spread. Small lesion along the left lateral medulla. Lesions are present along the wall of the fourth ventricle on the right. Mild diffuse enhancement of the eighth cranial nerve on the right likely due to leptomeningeal spread of tumor. Multiple enhancing nodules along the lateral ventricles and third ventricle. Enhancing lesion along the right temporal horn. Cluster of 5 mm lesions in the right gyrus rectus. Multiple enhancing lesions along the course of the corpus callosum. 8 mm enhancing lesion left frontal lobe Multiple enhancing lesions in the left anterior temporal lobe and left medial temporal lobe. 6 mm suprasellar lesion with minimal enhancement presumably leptomeningeal tumor. Negative for hydrocephalus Vascular: Normal arterial flow voids Skull and upper cervical spine: Small enhancing calvarial lesions in the parietal bone bilaterally stable and of uncertain etiology. Sinuses/Orbits: Mucosal edema left maxillary sinus.  Bilateral cataract surgery Other: None IMPRESSION: Marked progression of metastatic disease. 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 Left frontal parenchymal tumor. Suprasellar mass likely leptomeningeal spread of tumor. Cortical dysplasia right temporal lobe Electronically Signed   By: Franchot Gallo M.D.   On: 06/16/2019 15:03    ASSESSMENT AND PLAN: This is a very pleasant 73 years old white female with recently diagnosed extensive stage small cell lung cancer and she is currently undergoing systemic chemotherapy with carboplatin, etoposide and Imfinzi status post 5 cycles.  Starting from cycle #5 the patient will be on maintenance treatment with Imfinzi 1500 mg IV every 4 weeks. The patient tolerated the first cycle of her maintenance treatment with Imfinzi fairly well. I recommended for her to proceed with  cycle #6 of her total treatment today as planned. She will come back for follow-up visit in 4 weeks for evaluation with repeat CT scan of the chest, abdomen pelvis for restaging of her disease. For the metastatic brain lesion, the patient will proceed with whole brain irradiation under the care of Dr. Lisbeth Renshaw. She was advised to call immediately if she has any concerning symptoms in the interval. The patient voices understanding of current disease status and treatment options and is in agreement with the current care plan. All questions were answered. The patient knows to call the clinic with any problems, questions or concerns. We can certainly see the patient much sooner if necessary.  Disclaimer: This note was dictated with voice recognition software. Similar sounding words can inadvertently be transcribed and may not be corrected upon review.

## 2019-06-22 NOTE — Telephone Encounter (Signed)
I called the patient and her daughter let me know her mom was still asymptomatic. We will plan simulation for whole brain radiotherapy given the recent MRI findings. Dr. Mickeal Skinner has previously discussed this with her as well. We will have her simulate on Wednesday this week. Given the patient's claustrophobia and anxiety we will give her Ativan to take prior to imaging, planning, and radiation treatments. A new rx was sent to her pharmacy and we reviewed side effect profile of the medication.

## 2019-06-22 NOTE — Patient Instructions (Signed)
[facility] Discharge Instructions for Patients Receiving Chemotherapy  Today you received the following Immunotherapy agent: Durvalumab (Imfinzi)  To help prevent nausea and vomiting after your treatment, we encourage you to take your nausea medication as directed by your MD.   If you develop nausea and vomiting that is not controlled by your nausea medication, call the clinic.   BELOW ARE SYMPTOMS THAT SHOULD BE REPORTED IMMEDIATELY:  *FEVER GREATER THAN 100.5 F  *CHILLS WITH OR WITHOUT FEVER  NAUSEA AND VOMITING THAT IS NOT CONTROLLED WITH YOUR NAUSEA MEDICATION  *UNUSUAL SHORTNESS OF BREATH  *UNUSUAL BRUISING OR BLEEDING  TENDERNESS IN MOUTH AND THROAT WITH OR WITHOUT PRESENCE OF ULCERS  *URINARY PROBLEMS  *BOWEL PROBLEMS  UNUSUAL RASH Items with * indicate a potential emergency and should be followed up as soon as possible.  Feel free to call the clinic should you have any questions or concerns. The clinic phone number is [phone].  Please show the [facility] at check-in to the Emergency Department and triage nurse. Coronavirus (COVID-19) Are you at risk?  Are you at risk for the Coronavirus (COVID-19)?  To be considered HIGH RISK for Coronavirus (COVID-19), you have to meet the following criteria:  . Traveled to [place], [place], [place], [place] or [place]; or in the [place] to [place], [place], [place], or [place]; and have fever, cough, and shortness of breath within the last 2 weeks of travel OR . Been in close contact with a person diagnosed with COVID-19 within the last 2 weeks and have fever, cough, and shortness of breath . IF YOU DO NOT MEET THESE CRITERIA, YOU ARE CONSIDERED LOW RISK FOR COVID-19.  What to do if you are HIGH RISK for COVID-19?  Marland Kitchen If you are having a medical emergency, call 911. . Seek medical care right away. Before you go to a doctor's office, urgent care or emergency department, call ahead and tell  them about your recent travel, contact with someone diagnosed with COVID-19, and your symptoms. You should receive instructions from your physician's office regarding next steps of care.  . When you arrive at healthcare provider, tell the healthcare staff immediately you have returned from visiting [place], [place], [place], [place] or [place]; or traveled in the [place] to [place], [place], [place], or [place]; in the last two weeks or you have been in close contact with a person diagnosed with COVID-19 in the last 2 weeks.   . Tell the health care staff about your symptoms: fever, cough and shortness of breath. . After you have been seen by a medical provider, you will be either: o Tested for (COVID-19) and discharged home on quarantine except to seek medical care if symptoms worsen, and asked to  - Stay home and avoid contact with others until you get your results (4-5 days)  - Avoid travel on public transportation if possible (such as bus, train, or airplane) or o Sent to the Emergency Department by EMS for evaluation, COVID-19 testing, and possible admission depending on your condition and test results.  What to do if you are LOW RISK for COVID-19?  Reduce your risk of any infection by using the same precautions used for avoiding the common cold or flu:  Marland Kitchen Wash your hands often with soap and warm water for at least 20 seconds.  If soap and water are not readily available, use an alcohol-based hand sanitizer with at least 60% alcohol.  . If  coughing or sneezing, cover your mouth and nose by coughing or sneezing into the elbow areas of your shirt or coat, into a tissue or into your sleeve (not your hands). . Avoid shaking hands with others and consider head nods or verbal greetings only. . Avoid touching your eyes, nose, or mouth with unwashed hands.  . Avoid close contact with people who are sick. . Avoid places or events with large numbers of people in one location, like concerts or  sporting events. . Carefully consider travel plans you have or are making. . If you are planning any travel outside or inside the [place], visit the CDC's Travelers' Health webpage for the latest health notices. . If you have some symptoms but not all symptoms, continue to monitor at home and seek medical attention if your symptoms worsen. . If you are having a medical emergency, call 911.   [facility] / e-Visit: [email or web address]         [facility]: [facility]: [phone]                   [facility]: [phone]

## 2019-06-22 NOTE — Progress Notes (Signed)
Nutrition follow-up completed with patient during infusion for small cell lung cancer with brain metastases. Patient's weight improved and was documented as 83.3 pounds November 2 up from 82.4 pounds October 30. Patient reports she does have occasional nausea but takes nausea medication which is effective. She reports she feels she is eating pretty well.  She consumes 3 meals plus Ensure 3 times daily.  She prefers chocolate Ensure.  Nutrition diagnosis: Inadequate oral intake improved.  Intervention: Provided support and encouragement for patient to continue strategies for increasing calories and protein. Recommended Ensure Enlive 3 times daily. Provided coupons for Ensure Enlive. Questions were answered.  Teach back method used.  Monitoring, evaluation, goals: Patient will tolerate adequate calories and protein to minimize weight loss.  Next visit: Patient will contact me for questions or concerns.  RD will not be onsite for patients next 2 treatments.  **Disclaimer: This note was dictated with voice recognition software. Similar sounding words can inadvertently be transcribed and this note may contain transcription errors which may not have been corrected upon publication of note.**

## 2019-06-23 ENCOUNTER — Other Ambulatory Visit: Payer: Self-pay | Admitting: *Deleted

## 2019-06-23 ENCOUNTER — Telehealth: Payer: Self-pay | Admitting: Internal Medicine

## 2019-06-23 ENCOUNTER — Telehealth: Payer: Self-pay | Admitting: Pulmonary Disease

## 2019-06-23 MED ORDER — ALBUTEROL SULFATE HFA 108 (90 BASE) MCG/ACT IN AERS: 2.0000 | INHALATION_SPRAY | RESPIRATORY_TRACT | 0 refills | Status: AC | PRN

## 2019-06-23 NOTE — Telephone Encounter (Signed)
The daughter mentioned she has lung cancer and COPD and she has been using this religiously for a while. I sent in the Rx to [facility] and they have an appt with Dr. Vaughan Browner on Tuesday 06/30/2019. I advised her to discuss usage with Dr. Vaughan Browner then. She agreed and nothing further is needed.

## 2019-06-23 NOTE — Telephone Encounter (Signed)
We can change prescription to say every 4-6 hours and send in a new prescription. Why is she using it so much, is this normal for her?

## 2019-06-23 NOTE — Telephone Encounter (Signed)
Spoke with pt's daughter and she states she is using the albuterol religiously every 3-4 hours. I advised her that the Rx was written for Albuterol every 6 hours according to the original prescription. She states the pt told her that Dr. Vaughan Browner stated she could use every 3-4 hours. I told pt's daughter that the albuterol can be ineffective if used too frequently and that there was nothing we could do as far as the insurance paying for an early refill since she is almost out. [facility] stated they could not ship her another albuterol inhaler until 07/02/2019. Pt's daughter states she is going to run completely out. She states she is using the Trelegy but it is not working for her from what she can tell. I did advise her that she could pay for the inhaler out of pocket since she states her mom is having SOB and needs the inhaler. I wasn't sure if this was the best route, BW please advise.

## 2019-06-23 NOTE — Telephone Encounter (Signed)
Scheduled per los. Called and left msg. Mailed printout  °

## 2019-06-23 NOTE — Telephone Encounter (Signed)
pt's daughter called stating that pt is running out of albuterol (VENTOLIN HFA) every month - can she have a new RX with usage of 2-4 hours?? RX needs to go to [facility] and pt will be out before then - so will need a RX from [facility] before the [facility] can get here. Pt will be out in 2 days - Please advise CB# [phone]

## 2019-06-24 ENCOUNTER — Other Ambulatory Visit: Payer: Self-pay

## 2019-06-24 ENCOUNTER — Ambulatory Visit
Admission: RE | Admit: 2019-06-24 | Discharge: 2019-06-24 | Disposition: A | Discharge status: Home / Self Care | Payer: Medicare Other | Source: Ambulatory Visit | Attending: Radiation Oncology | Admitting: Radiation Oncology

## 2019-06-24 DIAGNOSIS — C7931 Secondary malignant neoplasm of brain: Secondary | ICD-10-CM | POA: Diagnosis not present

## 2019-06-24 DIAGNOSIS — Z510 Encounter for antineoplastic radiation therapy: Secondary | ICD-10-CM | POA: Diagnosis present

## 2019-06-24 DIAGNOSIS — C7949 Secondary malignant neoplasm of other parts of nervous system: Secondary | ICD-10-CM

## 2019-06-25 ENCOUNTER — Other Ambulatory Visit: Payer: Self-pay | Admitting: *Deleted

## 2019-06-25 DIAGNOSIS — Z510 Encounter for antineoplastic radiation therapy: Secondary | ICD-10-CM | POA: Diagnosis not present

## 2019-06-25 DIAGNOSIS — D492 Neoplasm of unspecified behavior of bone, soft tissue, and skin: Secondary | ICD-10-CM

## 2019-06-28 ENCOUNTER — Other Ambulatory Visit: Payer: Self-pay | Admitting: Radiation Oncology

## 2019-06-28 MED ORDER — PROCHLORPERAZINE MALEATE 10 MG PO TABS: ORAL_TABLET | ORAL | 0 refills | Status: DC

## 2019-06-30 ENCOUNTER — Other Ambulatory Visit: Payer: Self-pay | Admitting: Radiation Therapy

## 2019-06-30 ENCOUNTER — Ambulatory Visit (INDEPENDENT_AMBULATORY_CARE_PROVIDER_SITE_OTHER): Payer: Medicare Other | Admitting: Pulmonary Disease

## 2019-06-30 ENCOUNTER — Encounter: Payer: Self-pay | Admitting: Pulmonary Disease

## 2019-06-30 ENCOUNTER — Other Ambulatory Visit: Payer: Self-pay

## 2019-06-30 DIAGNOSIS — J449 Chronic obstructive pulmonary disease, unspecified: Secondary | ICD-10-CM

## 2019-06-30 DIAGNOSIS — R0602 Shortness of breath: Secondary | ICD-10-CM

## 2019-06-30 NOTE — Patient Instructions (Signed)
We will change the CT chest on 1/27 to CTA to evaluate pulmonary embolism Stop Trelegy.  We will start you on Breztri and give you samples We will schedule walk test to check ambulatory oxygen  Follow-up in 1 to 2 months.

## 2019-06-30 NOTE — Progress Notes (Signed)
Virtual Visit via Telephone Note  I connected with Pamela Russell on 06/30/19 at  1:30 PM EST by telephone and verified that I am speaking with the correct person using two identifiers.  Location: Patient: Home Provider: [facility], [place], [place]   I discussed the limitations, risks, security and privacy concerns of performing an evaluation and management service by telephone and the availability of in person appointments. I also discussed with the patient that there may be a patient responsible charge related to this service. The patient expressed understanding and agreed to proceed.   History of Present Illness: Follow-up for lung cancer, COPD   Observations/Objective: 73 year old with history of lung cancer COPD, asthma, hypertension, active smoker  Diagnosed with adenocarcinoma after right upper lobe biopsy in November 2019.  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  Underwent chemotherapy with carboplatin and etoposide and is currently on Imfinzi  Assessment and Plan: At last visit she was started on Trelegy inhaler.  States that she continues to have significant dyspnea on exertion without any change.  MRI brain in 10/27 noted with marked progression of metastatic disease  She is scheduled for a CT chest on 11/27.  We will see if we can convert this to CTA to evaluate pulmonary embolism. I would like to get a sooner scan but no appointments available at CT scan center per the patients.   Follow Up Instructions: - Stop Trelegy and start breztri - Check ambulatory o2 levels - CTA   I discussed the assessment and treatment plan with the patient. The patient was provided an opportunity to ask questions and all were answered. The patient agreed with the plan and  demonstrated an understanding of the instructions.   The patient was advised to call back or seek an in-person evaluation if the symptoms worsen or if the condition fails to improve as anticipated.  I provided 25 minutes of non-face-to-face time during this encounter.   Marshell Garfinkel MD [facility] 06/30/2019, 1:38 PM

## 2019-07-01 ENCOUNTER — Ambulatory Visit: Payer: Medicare Other | Admitting: Pulmonary Disease

## 2019-07-01 ENCOUNTER — Telehealth: Payer: Self-pay

## 2019-07-01 ENCOUNTER — Other Ambulatory Visit: Payer: Self-pay

## 2019-07-01 ENCOUNTER — Ambulatory Visit
Admission: RE | Admit: 2019-07-01 | Discharge: 2019-07-01 | Disposition: A | Discharge status: Home / Self Care | Payer: Medicare Other | Source: Ambulatory Visit | Attending: Radiation Oncology | Admitting: Radiation Oncology

## 2019-07-01 DIAGNOSIS — Z510 Encounter for antineoplastic radiation therapy: Secondary | ICD-10-CM | POA: Diagnosis not present

## 2019-07-01 NOTE — Telephone Encounter (Signed)
Patient came into the office today to have a walk done to monitor her oxygen per Dr. Vaughan Browner after she had a telephone visit yesterday(06/30/19). She was only able to walk down one hall(30 meters) before she was very sob and tired and had to sit and rest before going back. Her daughter states that she has been very weak.She started at 100% on room air with a heart rate of 89 and ended at 99% and a heart rate of 100.

## 2019-07-02 ENCOUNTER — Other Ambulatory Visit: Payer: Self-pay

## 2019-07-02 ENCOUNTER — Ambulatory Visit
Admission: RE | Admit: 2019-07-02 | Discharge: 2019-07-02 | Disposition: A | Discharge status: Home / Self Care | Payer: Medicare Other | Source: Ambulatory Visit | Attending: Radiation Oncology | Admitting: Radiation Oncology

## 2019-07-02 DIAGNOSIS — Z510 Encounter for antineoplastic radiation therapy: Secondary | ICD-10-CM | POA: Diagnosis not present

## 2019-07-03 ENCOUNTER — Ambulatory Visit
Admission: RE | Admit: 2019-07-03 | Discharge: 2019-07-03 | Disposition: A | Discharge status: Home / Self Care | Payer: Medicare Other | Source: Ambulatory Visit | Attending: Radiation Oncology | Admitting: Radiation Oncology

## 2019-07-03 ENCOUNTER — Other Ambulatory Visit: Payer: Self-pay

## 2019-07-03 DIAGNOSIS — Z510 Encounter for antineoplastic radiation therapy: Secondary | ICD-10-CM | POA: Diagnosis not present

## 2019-07-06 ENCOUNTER — Other Ambulatory Visit: Payer: Self-pay

## 2019-07-06 ENCOUNTER — Ambulatory Visit
Admission: RE | Admit: 2019-07-06 | Discharge: 2019-07-06 | Disposition: A | Discharge status: Home / Self Care | Payer: Medicare Other | Source: Ambulatory Visit | Attending: Radiation Oncology | Admitting: Radiation Oncology

## 2019-07-06 DIAGNOSIS — Z510 Encounter for antineoplastic radiation therapy: Secondary | ICD-10-CM | POA: Diagnosis not present

## 2019-07-07 ENCOUNTER — Ambulatory Visit
Admission: RE | Admit: 2019-07-07 | Discharge: 2019-07-07 | Disposition: A | Discharge status: Home / Self Care | Payer: Medicare Other | Source: Ambulatory Visit | Attending: Radiation Oncology | Admitting: Radiation Oncology

## 2019-07-07 DIAGNOSIS — Z510 Encounter for antineoplastic radiation therapy: Secondary | ICD-10-CM | POA: Diagnosis not present

## 2019-07-07 MED ORDER — BREZTRI AEROSPHERE 160-9-4.8 MCG/ACT IN AERO: 2.0000 | INHALATION_SPRAY | Freq: Two times a day (BID) | RESPIRATORY_TRACT | 0 refills | Status: AC

## 2019-07-07 NOTE — Addendum Note (Signed)
Addended by: Hildred Alamin I on: 07/07/2019 03:11 PM   Modules accepted: Orders

## 2019-07-08 ENCOUNTER — Ambulatory Visit
Admission: RE | Admit: 2019-07-08 | Discharge: 2019-07-08 | Disposition: A | Discharge status: Home / Self Care | Payer: Medicare Other | Source: Ambulatory Visit | Attending: Radiation Oncology | Admitting: Radiation Oncology

## 2019-07-08 ENCOUNTER — Other Ambulatory Visit: Payer: Self-pay

## 2019-07-08 ENCOUNTER — Telehealth: Payer: Self-pay | Admitting: *Deleted

## 2019-07-08 DIAGNOSIS — Z510 Encounter for antineoplastic radiation therapy: Secondary | ICD-10-CM | POA: Diagnosis not present

## 2019-07-08 NOTE — Telephone Encounter (Signed)
Received call from pt's daughter, Mont Dutton. She would like to change pt's appt for MRI Spine with anesthesia from 07/30/19 to 07/28/19. [facility] scheduling was called and vm message was left with Morey Hummingbird, scheduler with this request.  Spoke with Sissy and advised that request for schedule change was made but unknown it it could be done or not.  I gave Sissy's phone # to the scheduler. Sissy voiced understanding

## 2019-07-09 ENCOUNTER — Ambulatory Visit
Admission: RE | Admit: 2019-07-09 | Discharge: 2019-07-09 | Disposition: A | Discharge status: Home / Self Care | Payer: Medicare Other | Source: Ambulatory Visit | Attending: Radiation Oncology | Admitting: Radiation Oncology

## 2019-07-09 ENCOUNTER — Other Ambulatory Visit: Payer: Self-pay

## 2019-07-09 DIAGNOSIS — Z510 Encounter for antineoplastic radiation therapy: Secondary | ICD-10-CM | POA: Diagnosis not present

## 2019-07-10 ENCOUNTER — Other Ambulatory Visit: Payer: Self-pay

## 2019-07-10 ENCOUNTER — Other Ambulatory Visit: Payer: Self-pay | Admitting: Radiation Oncology

## 2019-07-10 ENCOUNTER — Ambulatory Visit
Admission: RE | Admit: 2019-07-10 | Discharge: 2019-07-10 | Disposition: A | Discharge status: Home / Self Care | Payer: Medicare Other | Source: Ambulatory Visit | Attending: Radiation Oncology | Admitting: Radiation Oncology

## 2019-07-10 DIAGNOSIS — Z510 Encounter for antineoplastic radiation therapy: Secondary | ICD-10-CM | POA: Diagnosis not present

## 2019-07-10 MED ORDER — PROCHLORPERAZINE MALEATE 10 MG PO TABS: ORAL_TABLET | ORAL | 1 refills | Status: AC

## 2019-07-12 ENCOUNTER — Other Ambulatory Visit: Payer: Self-pay

## 2019-07-12 ENCOUNTER — Ambulatory Visit
Admission: RE | Admit: 2019-07-12 | Discharge: 2019-07-12 | Disposition: A | Discharge status: Home / Self Care | Payer: Medicare Other | Source: Ambulatory Visit | Attending: Radiation Oncology | Admitting: Radiation Oncology

## 2019-07-12 DIAGNOSIS — Z510 Encounter for antineoplastic radiation therapy: Secondary | ICD-10-CM | POA: Diagnosis not present

## 2019-07-13 ENCOUNTER — Other Ambulatory Visit: Payer: Self-pay

## 2019-07-13 ENCOUNTER — Encounter: Payer: Self-pay | Admitting: Radiation Oncology

## 2019-07-13 ENCOUNTER — Ambulatory Visit: Payer: Medicare Other

## 2019-07-13 ENCOUNTER — Ambulatory Visit
Admission: RE | Admit: 2019-07-13 | Discharge: 2019-07-13 | Disposition: A | Discharge status: Home / Self Care | Payer: Medicare Other | Source: Ambulatory Visit | Attending: Radiation Oncology | Admitting: Radiation Oncology

## 2019-07-13 DIAGNOSIS — Z510 Encounter for antineoplastic radiation therapy: Secondary | ICD-10-CM | POA: Diagnosis not present

## 2019-07-14 ENCOUNTER — Ambulatory Visit: Payer: Medicare Other

## 2019-07-17 ENCOUNTER — Ambulatory Visit (HOSPITAL_COMMUNITY)
Admission: RE | Admit: 2019-07-17 | Discharge: 2019-07-17 | Disposition: A | Discharge status: Home / Self Care | Payer: Medicare Other | Source: Ambulatory Visit | Attending: Internal Medicine | Admitting: Internal Medicine

## 2019-07-17 ENCOUNTER — Other Ambulatory Visit: Payer: Self-pay | Admitting: Medical Oncology

## 2019-07-17 ENCOUNTER — Other Ambulatory Visit: Payer: Self-pay

## 2019-07-17 ENCOUNTER — Encounter (HOSPITAL_COMMUNITY): Payer: Self-pay

## 2019-07-17 DIAGNOSIS — C3431 Malignant neoplasm of lower lobe, right bronchus or lung: Secondary | ICD-10-CM | POA: Insufficient documentation

## 2019-07-17 DIAGNOSIS — D352 Benign neoplasm of pituitary gland: Secondary | ICD-10-CM

## 2019-07-17 DIAGNOSIS — Z5112 Encounter for antineoplastic immunotherapy: Secondary | ICD-10-CM

## 2019-07-17 MED ORDER — IOHEXOL 350 MG/ML SOLN
100.0000 mL | Freq: Once | INTRAVENOUS | Status: AC | PRN
  Administered 2019-07-17: 80 mL via INTRAVENOUS

## 2019-07-17 MED ORDER — SODIUM CHLORIDE (PF) 0.9 % IJ SOLN
INTRAMUSCULAR | Status: AC
  Filled 2019-07-17: qty 50

## 2019-07-20 ENCOUNTER — Inpatient Hospital Stay: Payer: Medicare Other

## 2019-07-20 ENCOUNTER — Other Ambulatory Visit: Payer: Self-pay

## 2019-07-20 ENCOUNTER — Encounter: Payer: Self-pay | Admitting: Internal Medicine

## 2019-07-20 ENCOUNTER — Inpatient Hospital Stay: Payer: Medicare Other | Admitting: Internal Medicine

## 2019-07-20 VITALS — BP 154/76 | HR 84 | Temp 98.7°F | Resp 20 | Ht 64.0 in | Wt 82.7 lb

## 2019-07-20 DIAGNOSIS — Z95828 Presence of other vascular implants and grafts: Secondary | ICD-10-CM

## 2019-07-20 DIAGNOSIS — Z5112 Encounter for antineoplastic immunotherapy: Secondary | ICD-10-CM

## 2019-07-20 DIAGNOSIS — C3411 Malignant neoplasm of upper lobe, right bronchus or lung: Secondary | ICD-10-CM | POA: Diagnosis not present

## 2019-07-20 DIAGNOSIS — C7931 Secondary malignant neoplasm of brain: Secondary | ICD-10-CM

## 2019-07-20 DIAGNOSIS — C3431 Malignant neoplasm of lower lobe, right bronchus or lung: Secondary | ICD-10-CM

## 2019-07-20 DIAGNOSIS — Z7189 Other specified counseling: Secondary | ICD-10-CM

## 2019-07-20 DIAGNOSIS — C7949 Secondary malignant neoplasm of other parts of nervous system: Secondary | ICD-10-CM

## 2019-07-20 DIAGNOSIS — Z5111 Encounter for antineoplastic chemotherapy: Secondary | ICD-10-CM

## 2019-07-20 DIAGNOSIS — D352 Benign neoplasm of pituitary gland: Secondary | ICD-10-CM

## 2019-07-20 LAB — CBC WITH DIFFERENTIAL (CANCER CENTER ONLY)
Abs Immature Granulocytes: 0.01 10*3/uL (ref 0.00–0.07)
Basophils Absolute: 0 10*3/uL (ref 0.0–0.1)
Basophils Relative: 1 %
Eosinophils Absolute: 0.1 10*3/uL (ref 0.0–0.5)
Eosinophils Relative: 1 %
HCT: 36.4 % (ref 36.0–46.0)
Hemoglobin: 12.4 g/dL (ref 12.0–15.0)
Immature Granulocytes: 0 %
Lymphocytes Relative: 15 %
Lymphs Abs: 0.8 10*3/uL (ref 0.7–4.0)
MCH: 31.3 pg (ref 26.0–34.0)
MCHC: 34.1 g/dL (ref 30.0–36.0)
MCV: 91.9 fL (ref 80.0–100.0)
Monocytes Absolute: 0.6 10*3/uL (ref 0.1–1.0)
Monocytes Relative: 10 %
Neutro Abs: 4 10*3/uL (ref 1.7–7.7)
Neutrophils Relative %: 73 %
Platelet Count: 215 10*3/uL (ref 150–400)
RBC: 3.96 MIL/uL (ref 3.87–5.11)
RDW: 12.7 % (ref 11.5–15.5)
WBC Count: 5.5 10*3/uL (ref 4.0–10.5)
nRBC: 0 % (ref 0.0–0.2)

## 2019-07-20 LAB — CMP (CANCER CENTER ONLY)
ALT: 17 U/L (ref 0–44)
AST: 15 U/L (ref 15–41)
Albumin: 3.9 g/dL (ref 3.5–5.0)
Alkaline Phosphatase: 81 U/L (ref 38–126)
Anion gap: 13 (ref 5–15)
BUN: 10 mg/dL (ref 8–23)
CO2: 23 mmol/L (ref 22–32)
Calcium: 9.2 mg/dL (ref 8.9–10.3)
Chloride: 97 mmol/L — ABNORMAL LOW (ref 98–111)
Creatinine: 0.52 mg/dL (ref 0.44–1.00)
GFR, Est AFR Am: >60 mL/min (ref >60)
GFR, Estimated: >60 mL/min (ref >60)
Glucose, Bld: 93 mg/dL (ref 70–99)
Potassium: 4.4 mmol/L (ref 3.5–5.1)
Sodium: 133 mmol/L — ABNORMAL LOW (ref 135–145)
Total Bilirubin: 0.3 mg/dL (ref 0.3–1.2)
Total Protein: 6.6 g/dL (ref 6.5–8.1)

## 2019-07-20 LAB — TSH: TSH: 1.939 u[IU]/mL (ref 0.308–3.960)

## 2019-07-20 MED ORDER — SODIUM CHLORIDE 0.9% FLUSH
10.0000 mL | Freq: Once | INTRAVENOUS | Status: AC
  Administered 2019-07-20: 10 mL
  Filled 2019-07-20: qty 10

## 2019-07-20 MED ORDER — HEPARIN SOD (PORK) LOCK FLUSH 100 UNIT/ML IV SOLN
500.0000 [IU] | Freq: Once | INTRAVENOUS | Status: AC
  Administered 2019-07-20: 500 [IU]
  Filled 2019-07-20: qty 5

## 2019-07-20 NOTE — Progress Notes (Signed)
[place] Telephone:(336) [phone]   Fax:(336) [place], [place], [address] 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 2)  History of bilateral adenocarcinoma of the lung presenting with right upper lobe and left upper lobe pulmonary nodules  PRIOR THERAPY:  1) status post curative SBRT completed in January 2020. 2) Systemic chemotherapy with carboplatin for AUC of 5 on day 1, etoposide 100 mg/M2 and Imfinzi 1500 mg IV every 3 weeks.  Status post 4 cycles. 3) whole brain irradiation under the care of Dr. Lisbeth Renshaw. 4)  Maintenance treatment with Imfinzi 1500 mg IV every 4 weeks.  First dose May 25, 2019.  Status post 2 cycles.  Last cycle was given on June 22, 2019.  This was discontinued secondary to disease progression.  CURRENT THERAPY: Second line systemic chemotherapy with Lurbinectedin 2.3 mg/M2 every 3 weeks.  First dose July 27, 2019.  INTERVAL HISTORY: Pamela Russell 73 y.o. female returns to the clinic today for follow-up visit.  The patient is feeling fine today with no concerning complaints except for the fatigue and shortness of breath with exertion.  She denied having any chest pain, cough or hemoptysis.  She denied having any nausea or vomiting but continues to have intermittent abdominal pain with no diarrhea or constipation.  She has no headache or visual changes.  She denied having any significant weight loss or night sweats.  She has been tolerating her maintenance treatment with Imfinzi fairly well.  The patient had repeat CT scan of the chest, abdomen pelvis performed recently and she is here for evaluation and discussion of her scan results.   MEDICAL HISTORY: Past Medical History:  Diagnosis Date   Allergic rhinitis     Anxiety    panic attack 02/2019- seen in ED - it presented as chest pain   Asthma 2008   Chronic obstructive pulmonary disease (COPD) ([place]) 06/08/2016   Meds: Spiriva, albuterol inhaler PFT (11/29/2016): Moderate-severe obstruction by PFT 11/2016   Dyspnea 06/15/2019   with walking - pulse ox- stay 94- 97%   Ear itching 07/05/2017   Grieving 06/08/2016   History of pneumonia    x4   Hyperlipidemia 09/10/2016   On atorvastatin 40 mg QD, ASA 81 mg QD ASCVD risk 17.1%, mod-high intensity statin Risk factors: FHx sig for MI: mother 74s-50s, and father age 45, HTN, smoker   Hypertension    no problem   Night sweats 06/08/2016   NSCL Ca dx'd 05/2018   xrt comp 09/2018- Lung has had radiotion and chemo   Pneumonia     3 times last time 2017 ish   Pulmonary nodules 06/12/2017   Tobacco use disorder 07/06/2016   On nicotine replacement patch. Smoking cigs since age 52. Has 60 pack year history. Has COPD dx on SABA and LAMA.    ALLERGIES:  is allergic to codeine; penicillins; and anoro ellipta [umeclidinium-vilanterol].  MEDICATIONS:  Current Outpatient Medications  Medication Sig Dispense Refill   albuterol (VENTOLIN HFA) 108 (90 Base) MCG/ACT inhaler Inhale 2 puffs into the lungs every 4 (four) hours as needed (wheezing/shortness of breath.). 18 g 0   aspirin EC 81 MG tablet Take 81 mg by mouth daily.     Budeson-Glycopyrrol-Formoterol (BREZTRI AEROSPHERE) 160-9-4.8 MCG/ACT AERO Inhale 2 puffs into the lungs 2 (  two) times daily. 10.7 g 0   cetirizine (ZYRTEC) 10 MG tablet Take 10 mg by mouth daily.     fluticasone (FLONASE) 50 MCG/ACT nasal spray USE 2 SPRAYS IN BOTH  NOSTRILS DAILY (Patient taking differently: Place 2 sprays into both nostrils daily. ) 32 g 1   Fluticasone-Umeclidin-Vilant (TRELEGY ELLIPTA) 100-62.5-25 MCG/INH AEPB Inhale 1 puff into the lungs daily. 60 each 3   glucosamine-chondroitin 500-400 MG tablet Take 1 tablet by mouth daily.      guaiFENesin  (MUCINEX) 600 MG 12 hr tablet Take 600 mg by mouth 2 (two) times daily.     ibuprofen (ADVIL) 200 MG tablet Take 400 mg by mouth every 6 (six) hours as needed for moderate pain.     lidocaine-prilocaine (EMLA) cream Apply 1 application topically daily as needed (port).      LORazepam (ATIVAN) 0.5 MG tablet 1 tab po q 4-6 hours prn or 1 tab po 30 minutes prior to radiation 30 tablet 0   mometasone (ELOCON) 0.1 % lotion Apply 1 application topically daily. Applied to ears daily     Multiple Vitamin (MULTIVITAMIN WITH MINERALS) TABS tablet Take 1 tablet by mouth daily.     prochlorperazine (COMPAZINE) 10 MG tablet TAKE 1 TABLET BY MOUTH EVERY 6 HOURS AS NEEDED FOR NAUSEA AND VOMITING 60 tablet 1   sodium chloride (OCEAN) 0.65 % SOLN nasal spray Place 1 spray into both nostrils as needed for congestion.     No current facility-administered medications for this visit.     SURGICAL HISTORY:  Past Surgical History:  Procedure Laterality Date   CATARACT EXTRACTION Left 05/08/2016   CATARACT EXTRACTION Right 05/22/2016   IR IMAGING GUIDED PORT INSERTION  02/25/2019   RADIOLOGY WITH ANESTHESIA N/A 02/13/2019   Procedure: BRAIN MRI;  Surgeon: Radiologist, Medication, MD;  Location: [facility];  Service: Radiology;  Laterality: N/A;   RADIOLOGY WITH ANESTHESIA N/A 06/16/2019   Procedure: MRI WITH ANESTHESIA OF BRAIN WITH AND WITHOUT CONTRAST;  Surgeon: Radiologist, Medication, MD;  Location: [facility];  Service: Radiology;  Laterality: N/A;   TOTAL ABDOMINAL HYSTERECTOMY W/ BILATERAL SALPINGOOPHORECTOMY Bilateral 1975   VIDEO BRONCHOSCOPY WITH ENDOBRONCHIAL NAVIGATION Left 07/09/2018   Procedure: VIDEO BRONCHOSCOPY WITH ENDOBRONCHIAL NAVIGATION;  Surgeon: Collene Gobble, MD;  Location: [facility];  Service: Thoracic;  Laterality: Left;    REVIEW OF SYSTEMS:  Constitutional: positive for fatigue Eyes: negative Ears, nose, mouth, throat, and face: negative Respiratory: positive for dyspnea on  exertion Cardiovascular: negative Gastrointestinal: positive for abdominal pain Genitourinary:negative Integument/breast: negative Hematologic/lymphatic: negative Musculoskeletal:negative Neurological: negative Behavioral/Psych: negative Endocrine: negative Allergic/Immunologic: negative   PHYSICAL EXAMINATION: General appearance: alert, cooperative, fatigued and no distress Head: Normocephalic, without obvious abnormality, atraumatic Neck: no adenopathy, no JVD, supple, symmetrical, trachea midline and thyroid not enlarged, symmetric, no tenderness/mass/nodules Lymph nodes: Cervical, supraclavicular, and axillary nodes normal. Resp: clear to auscultation bilaterally Back: symmetric, no curvature. ROM normal. No CVA tenderness. Cardio: regular rate and rhythm, S1, S2 normal, no murmur, click, rub or gallop GI: soft, non-tender; bowel sounds normal; no masses,  no organomegaly Extremities: extremities normal, atraumatic, no cyanosis or edema Neurologic: Alert and oriented X 3, normal strength and tone. Normal symmetric reflexes. Normal coordination and gait  ECOG PERFORMANCE STATUS: 1 - Symptomatic but completely ambulatory  Blood pressure (!) 154/76, pulse 84, temperature 98.7 F (37.1 C), temperature source Oral, resp. rate 20, height 5\' 4"  (1.626 m), weight 82 lb 11.2 oz (37.5 kg), SpO2 100 %.  LABORATORY DATA: Lab  Results  Component Value Date   WBC 5.5 07/20/2019   HGB 12.4 07/20/2019   HCT 36.4 07/20/2019   MCV 91.9 07/20/2019   PLT 215 07/20/2019      Chemistry      Component Value Date/Time   NA 128 (L) 06/16/2019 0819   NA 130 (L) 01/26/2019 1153   K 4.3 06/16/2019 0819   CL 92 (L) 06/16/2019 0819   CO2 25 06/16/2019 0819   BUN 6 (L) 06/16/2019 0819   BUN 5 (L) 01/26/2019 1153   CREATININE 0.40 (L) 06/16/2019 0819   CREATININE 0.52 05/25/2019 1044   CREATININE 0.69 06/08/2016 1128      Component Value Date/Time   CALCIUM 9.7 06/16/2019 0819   ALKPHOS 77  06/16/2019 0819   AST 18 06/16/2019 0819   AST 16 05/25/2019 1044   ALT 14 06/16/2019 0819   ALT 17 05/25/2019 1044   BILITOT 0.4 06/16/2019 0819   BILITOT 0.3 05/25/2019 1044       RADIOGRAPHIC STUDIES: Ct Angio Chest Pe W Or Wo Contrast  Result Date: 07/17/2019 CLINICAL DATA:  Restaging of right upper lobe small cell lung cancer. Recent completion of radiation therapy. Dyspnea on exertion. EXAM: CT ANGIOGRAPHY CHEST CT ABDOMEN AND PELVIS WITH CONTRAST TECHNIQUE: Multidetector CT imaging of the chest was performed using the standard protocol during bolus administration of intravenous contrast. Multiplanar CT image reconstructions and MIPs were obtained to evaluate the vascular anatomy. Multidetector CT imaging of the abdomen and pelvis was performed using the standard protocol during bolus administration of intravenous contrast. CONTRAST:  68mL OMNIPAQUE IOHEXOL 350 MG/ML SOLN COMPARISON:  06/11/2019 FINDINGS: CTA CHEST FINDINGS Cardiovascular: No filling defect is identified in the pulmonary arterial tree to suggest pulmonary embolus. Coronary, aortic arch, and branch vessel atherosclerotic vascular disease. Focal myocardial thinning at the left ventricular apex, possible 0.6 cm pseudoaneurysm in this vicinity on image 36/6. If present previously this was not readily appreciable due to differences in exam technique. Mediastinum/Nodes: A new ill-defined nodularity in the right pericardial adipose tissue measures 1.3 cm in short axis on image 74/8 and may represent a pericardial lymph node. Another right anterior pericardial node measures 0.8 cm in short axis on image 75/8, formerly 0.3 cm. Lungs/Pleura: Biapical pleuroparenchymal scarring with calcification. Sub solid right upper lobe nodule 0.6 cm in diameter on image 41/10, stable. Mild increase in the hazy density at the right lung apex for example on image 22/10. Ground-glass density nodule 1.7 cm in long axis on image 48/10 in the right upper  lobe, stable by my measurements. A right lower lobe nodule is blurred by motion artifact but measures 4 by 3 mm on image 114/10, not appreciated previously. There is some airway plugging in the left lower lobe example on image 84/10, stable appearance to prior exam. A left lower lobe nodule medially on image 78/10 measures 0.8 by 0.5 cm, and is new compared to 05/22/2019. Musculoskeletal: Three stable sclerotic lesions are observed. This includes a 1 cm sclerotic lesion eccentric to the right in the T1 vertebral body, stable. 0.4 cm sclerotic lesion at T3, stable. Small nonspecific rim sclerotic lesion in the sternal manubrium about 6 mm in diameter on image 64/9, stable. 90% compression fracture at T5 and 40% compression fracture at T7, similar to the prior exam. Mildly worsened 30% inferior endplate compression fracture at T6. Review of the MIP images confirms the above findings. CT ABDOMEN and PELVIS FINDINGS Hepatobiliary: Unremarkable Pancreas: Interposed between the pancreas and the left  hepatic lobe and abutting adjacent vessel such as the celiac artery and common hepatic artery, we demonstrate a 4.3 by 1.9 by 3.6 cm soft tissue density mass. This probably represents adenopathy but is difficult to separate from the pancreas. The pancreas appears otherwise normal. Spleen: Unremarkable Adrenals/Urinary Tract: Adrenal glands unremarkable. Pelvic position of the right kidney is again observed, with mild right hydronephrosis potentially due to narrowing at the UPJ based on the acute angulation at the UPJ. Mild scarring of the left kidney upper pole. Urinary bladder is empty. Stomach/Bowel: Sigmoid colon diverticulosis. Vascular/Lymphatic: Aortoiliac atherosclerotic vascular disease. Aortocaval node 0.9 cm in short axis on image 19/11, formerly 0.3 cm. Retrocaval node 0.9 cm in short axis on image 26/11, previously not present. Small left common and external iliac lymph nodes present such as a 0.8 cm left common  iliac node on image 34/11, previously not visible. Reproductive: Prior hysterectomy. Adnexa unremarkable. Other: No supplemental non-categorized findings. Musculoskeletal: New 30% superior endplate compression fracture at L2. Review of the MIP images confirms the above findings. IMPRESSION: 1. Progressive adenopathy especially along the porta hepatis and peripancreatic region compatible with malignancy. There is some new pericardial adenopathy as well as borderline enlarged but newly prominent retroperitoneal and upper pelvic lymph nodes concerning for malignancy. 2. Similar appearance of ground-glass density nodules in the right lung. 3. No filling defect is identified in the pulmonary arterial tree to suggest pulmonary embolus. 4. Focal myocardial thinning of the left ventricular apex, potentially a small pseudoaneurysm. 5. New single pulmonary nodules in the lower lobes, early metastatic spread not excluded. 6. Three stable sclerotic lesions along the thorax, nonspecific with regard to active malignancy. 7. Stable 90% compression fracture at T5 and stable 40% compression fracture at T7. Mildly worsened 30% inferior endplate compression fracture at T6 and new 30% superior endplate compression fracture at L2. Appearance compatible with osteoporosis. 8. Other imaging findings of potential clinical significance: Coronary atherosclerosis. Pelvic position of the right kidney with mild right hydronephrosis potentially due to narrowing at the UPJ. Sigmoid colon diverticulosis. Aortic Atherosclerosis (ICD10-I70.0). Electronically Signed   By: Van Clines M.D.   On: 07/17/2019 15:46   Ct Abdomen Pelvis W Contrast  Result Date: 07/17/2019 CLINICAL DATA:  Restaging of right upper lobe small cell lung cancer. Recent completion of radiation therapy. Dyspnea on exertion. EXAM: CT ANGIOGRAPHY CHEST CT ABDOMEN AND PELVIS WITH CONTRAST TECHNIQUE: Multidetector CT imaging of the chest was performed using the standard  protocol during bolus administration of intravenous contrast. Multiplanar CT image reconstructions and MIPs were obtained to evaluate the vascular anatomy. Multidetector CT imaging of the abdomen and pelvis was performed using the standard protocol during bolus administration of intravenous contrast. CONTRAST:  27mL OMNIPAQUE IOHEXOL 350 MG/ML SOLN COMPARISON:  06/11/2019 FINDINGS: CTA CHEST FINDINGS Cardiovascular: No filling defect is identified in the pulmonary arterial tree to suggest pulmonary embolus. Coronary, aortic arch, and branch vessel atherosclerotic vascular disease. Focal myocardial thinning at the left ventricular apex, possible 0.6 cm pseudoaneurysm in this vicinity on image 36/6. If present previously this was not readily appreciable due to differences in exam technique. Mediastinum/Nodes: A new ill-defined nodularity in the right pericardial adipose tissue measures 1.3 cm in short axis on image 74/8 and may represent a pericardial lymph node. Another right anterior pericardial node measures 0.8 cm in short axis on image 75/8, formerly 0.3 cm. Lungs/Pleura: Biapical pleuroparenchymal scarring with calcification. Sub solid right upper lobe nodule 0.6 cm in diameter on image 41/10, stable. Mild increase in  the hazy density at the right lung apex for example on image 22/10. Ground-glass density nodule 1.7 cm in long axis on image 48/10 in the right upper lobe, stable by my measurements. A right lower lobe nodule is blurred by motion artifact but measures 4 by 3 mm on image 114/10, not appreciated previously. There is some airway plugging in the left lower lobe example on image 84/10, stable appearance to prior exam. A left lower lobe nodule medially on image 78/10 measures 0.8 by 0.5 cm, and is new compared to 05/22/2019. Musculoskeletal: Three stable sclerotic lesions are observed. This includes a 1 cm sclerotic lesion eccentric to the right in the T1 vertebral body, stable. 0.4 cm sclerotic lesion  at T3, stable. Small nonspecific rim sclerotic lesion in the sternal manubrium about 6 mm in diameter on image 64/9, stable. 90% compression fracture at T5 and 40% compression fracture at T7, similar to the prior exam. Mildly worsened 30% inferior endplate compression fracture at T6. Review of the MIP images confirms the above findings. CT ABDOMEN and PELVIS FINDINGS Hepatobiliary: Unremarkable Pancreas: Interposed between the pancreas and the left hepatic lobe and abutting adjacent vessel such as the celiac artery and common hepatic artery, we demonstrate a 4.3 by 1.9 by 3.6 cm soft tissue density mass. This probably represents adenopathy but is difficult to separate from the pancreas. The pancreas appears otherwise normal. Spleen: Unremarkable Adrenals/Urinary Tract: Adrenal glands unremarkable. Pelvic position of the right kidney is again observed, with mild right hydronephrosis potentially due to narrowing at the UPJ based on the acute angulation at the UPJ. Mild scarring of the left kidney upper pole. Urinary bladder is empty. Stomach/Bowel: Sigmoid colon diverticulosis. Vascular/Lymphatic: Aortoiliac atherosclerotic vascular disease. Aortocaval node 0.9 cm in short axis on image 19/11, formerly 0.3 cm. Retrocaval node 0.9 cm in short axis on image 26/11, previously not present. Small left common and external iliac lymph nodes present such as a 0.8 cm left common iliac node on image 34/11, previously not visible. Reproductive: Prior hysterectomy. Adnexa unremarkable. Other: No supplemental non-categorized findings. Musculoskeletal: New 30% superior endplate compression fracture at L2. Review of the MIP images confirms the above findings. IMPRESSION: 1. Progressive adenopathy especially along the porta hepatis and peripancreatic region compatible with malignancy. There is some new pericardial adenopathy as well as borderline enlarged but newly prominent retroperitoneal and upper pelvic lymph nodes concerning for  malignancy. 2. Similar appearance of ground-glass density nodules in the right lung. 3. No filling defect is identified in the pulmonary arterial tree to suggest pulmonary embolus. 4. Focal myocardial thinning of the left ventricular apex, potentially a small pseudoaneurysm. 5. New single pulmonary nodules in the lower lobes, early metastatic spread not excluded. 6. Three stable sclerotic lesions along the thorax, nonspecific with regard to active malignancy. 7. Stable 90% compression fracture at T5 and stable 40% compression fracture at T7. Mildly worsened 30% inferior endplate compression fracture at T6 and new 30% superior endplate compression fracture at L2. Appearance compatible with osteoporosis. 8. Other imaging findings of potential clinical significance: Coronary atherosclerosis. Pelvic position of the right kidney with mild right hydronephrosis potentially due to narrowing at the UPJ. Sigmoid colon diverticulosis. Aortic Atherosclerosis (ICD10-I70.0). Electronically Signed   By: Van Clines M.D.   On: 07/17/2019 15:46    ASSESSMENT AND PLAN: This is a very pleasant 73 years old white female with recently diagnosed extensive stage small cell lung cancer and she is currently undergoing systemic chemotherapy with carboplatin, etoposide and Imfinzi status post  6 cycles.  Starting from cycle #5 the patient will be on maintenance treatment with Imfinzi 1500 mg IV every 4 weeks. She is also status post post 2 cycles of maintenance treatment with single agent Imfinzi and tolerated her treatment well. The patient had repeat CT scan of the chest, abdomen pelvis performed recently.  I personally and independently reviewed the scans and discussed the results with the patient today. Unfortunately her scan showed evidence for disease progression especially in the abdominal lymphadenopathy. I recommended for the patient to discontinue her current treatment with Imfinzi. I discussed with her other options  for treatment including palliative care and hospice referral versus consideration of second line treatment with Lurbinectedin 2.3 mg/M2 every 3 weeks.  I discussed with the patient the adverse effect of this treatment including but not limited to alopecia, myelosuppression, nausea and vomiting, peripheral neuropathy, liver or renal dysfunction. The patient would like to proceed with the treatment with Lurbinectedin and she is expected to start the first cycle of this treatment on July 27, 2019. She will come back for follow-up visit in 4 weeks for evaluation with the start of cycle #2. She was advised to call immediately if she has any concerning symptoms in the interval. For the metastatic brain lesion, she underwent whole brain irradiation under the care of Dr. Lisbeth Renshaw. The patient voices understanding of current disease status and treatment options and is in agreement with the current care plan. All questions were answered. The patient knows to call the clinic with any problems, questions or concerns. We can certainly see the patient much sooner if necessary.  Disclaimer: This note was dictated with voice recognition software. Similar sounding words can inadvertently be transcribed and may not be corrected upon review.

## 2019-07-20 NOTE — Progress Notes (Signed)
DISCONTINUE ON PATHWAY REGIMEN - Small Cell Lung     Cycles 1 through 4, every 21 days:     Atezolizumab      Carboplatin      Etoposide    Cycles 5 and beyond, every 21 days:     Atezolizumab   **Always confirm dose/schedule in your pharmacy ordering system**  REASON: Disease Progression PRIOR TREATMENT: [id number]: Atezolizumab 1,200 mg D1 + Carboplatin AUC=5 D1 + Etoposide 100 mg/m2 D1-3 q21 Days x 4 Cycles, Followed by Atezolizumab 1,200 mg Maintenance Until Progression or Unacceptable Toxicity TREATMENT RESPONSE: Progressive Disease (PD)  START ON PATHWAY REGIMEN - Small Cell Lung     A cycle is every 21 days:     Lurbinectedin   **Always confirm dose/schedule in your pharmacy ordering system**  Patient Characteristics: Relapsed or Progressive Disease, Second Line, Relapse < 3 Months Therapeutic Status: Relapsed or Progressive Disease Line of Therapy: Second Line Time to Relapse: Relapse < 3 Months Intent of Therapy: Non-Curative / Palliative Intent, Discussed with Patient

## 2019-07-21 NOTE — Progress Notes (Signed)
  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 Brain metastasis ([place])  C79.31      The patient presented for simulation for the patient's upcoming course of whole brain radiation treatment. The patient was placed in a supine position and a customized thermoplastic head cast was constructed to aid in patient immobilization during the treatment. This complex treatment device will be used on a daily basis. In this fashion a CT scan was obtained through the head and neck region and isocenter was placed near midline within the brain.  The patient will be planned to receive a course of whole brain radiation treatment to a dose of 30 gray in 10 fractions at 3 gray per fraction. To accomplish this, 2 customized blocks have been designed which corresponds to left and right whole brain radiation fields. These 2 complex treatment devices will be used on a daily basis during the course of radiation. A complex isodose plan is requested to insure that the target area is adequately covered in to facilitate optimization of the treatment plan. A forward planning technique will also be evaluated to determine if this approach significantly improves the plan.   ________________________________   Jodelle Gross, MD, PhD

## 2019-07-22 ENCOUNTER — Telehealth: Payer: Self-pay | Admitting: Internal Medicine

## 2019-07-22 ENCOUNTER — Telehealth: Payer: Self-pay | Admitting: Radiation Oncology

## 2019-07-22 MED ORDER — KETOROLAC TROMETHAMINE 10 MG PO TABS: 10.0000 mg | ORAL_TABLET | Freq: Three times a day (TID) | ORAL | 0 refills | Status: DC

## 2019-07-22 MED ORDER — KETOROLAC TROMETHAMINE 10 MG PO TABS: 10.0000 mg | ORAL_TABLET | Freq: Three times a day (TID) | ORAL | 0 refills | Status: AC

## 2019-07-22 NOTE — Telephone Encounter (Signed)
The patient's daughter called and let us know she did not tell Dr. Julien Nordmann about pain in her back or in her abdomen. She has been taking ibuprofen regularly but would like to try a nonnarcotic medication that could be stronger. Her kidney function appears appropriate to try po toradol but she needs to start an over the counter PPI like prilosec to reduce the risk of GI ulcer. 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 If the Toradol doesn't work, we could consider referral to pain management. For her abdominal disease she is getting ready to start a new therapy next week which we hope will improve her abdominal adenopathy. She will continue her prescription antiemetics as well. For her back pain she has multiple compression fractures and we will review her upcoming MRI of the spine in brain/spine oncology conference. She may be a candidate with neurosurgery to have vertebral augmentation. I will reach out to her daughter Josephina Shih after the conference discussion. She is in agreement with this plan.

## 2019-07-22 NOTE — Telephone Encounter (Signed)
We can refer to a pain clinic.  Thank you.

## 2019-07-22 NOTE — Telephone Encounter (Signed)
Scheduled per los. Called and spoke with patients daughter. Confirmed appt

## 2019-07-27 ENCOUNTER — Inpatient Hospital Stay: Payer: Medicare Other | Attending: Internal Medicine

## 2019-07-27 ENCOUNTER — Inpatient Hospital Stay: Payer: Medicare Other

## 2019-07-27 ENCOUNTER — Other Ambulatory Visit: Payer: Self-pay | Admitting: Medical

## 2019-07-27 ENCOUNTER — Inpatient Hospital Stay: Payer: Medicare Other | Admitting: Medical

## 2019-07-27 ENCOUNTER — Other Ambulatory Visit: Payer: Self-pay

## 2019-07-27 ENCOUNTER — Ambulatory Visit (HOSPITAL_COMMUNITY)
Admission: RE | Admit: 2019-07-27 | Discharge: 2019-07-27 | Disposition: A | Discharge status: Home / Self Care | Payer: Medicare Other | Source: Ambulatory Visit | Attending: Internal Medicine | Admitting: Internal Medicine

## 2019-07-27 VITALS — BP 139/78 | HR 80 | Temp 98.3°F | Resp 18 | Wt 82.5 lb

## 2019-07-27 DIAGNOSIS — C3431 Malignant neoplasm of lower lobe, right bronchus or lung: Secondary | ICD-10-CM

## 2019-07-27 DIAGNOSIS — C7949 Secondary malignant neoplasm of other parts of nervous system: Secondary | ICD-10-CM | POA: Insufficient documentation

## 2019-07-27 DIAGNOSIS — Z7951 Long term (current) use of inhaled steroids: Secondary | ICD-10-CM | POA: Insufficient documentation

## 2019-07-27 DIAGNOSIS — Z90710 Acquired absence of both cervix and uterus: Secondary | ICD-10-CM | POA: Diagnosis not present

## 2019-07-27 DIAGNOSIS — Z01812 Encounter for preprocedural laboratory examination: Secondary | ICD-10-CM | POA: Insufficient documentation

## 2019-07-27 DIAGNOSIS — Z20828 Contact with and (suspected) exposure to other viral communicable diseases: Secondary | ICD-10-CM | POA: Insufficient documentation

## 2019-07-27 DIAGNOSIS — Z791 Long term (current) use of non-steroidal anti-inflammatories (NSAID): Secondary | ICD-10-CM | POA: Insufficient documentation

## 2019-07-27 DIAGNOSIS — Z5111 Encounter for antineoplastic chemotherapy: Secondary | ICD-10-CM | POA: Diagnosis not present

## 2019-07-27 DIAGNOSIS — Z79899 Other long term (current) drug therapy: Secondary | ICD-10-CM | POA: Diagnosis not present

## 2019-07-27 DIAGNOSIS — C3411 Malignant neoplasm of upper lobe, right bronchus or lung: Secondary | ICD-10-CM

## 2019-07-27 DIAGNOSIS — C7931 Secondary malignant neoplasm of brain: Secondary | ICD-10-CM | POA: Insufficient documentation

## 2019-07-27 DIAGNOSIS — Z90722 Acquired absence of ovaries, bilateral: Secondary | ICD-10-CM | POA: Insufficient documentation

## 2019-07-27 DIAGNOSIS — Z9079 Acquired absence of other genital organ(s): Secondary | ICD-10-CM | POA: Diagnosis not present

## 2019-07-27 DIAGNOSIS — Z95828 Presence of other vascular implants and grafts: Secondary | ICD-10-CM

## 2019-07-27 DIAGNOSIS — Z87891 Personal history of nicotine dependence: Secondary | ICD-10-CM | POA: Diagnosis not present

## 2019-07-27 DIAGNOSIS — C7970 Secondary malignant neoplasm of unspecified adrenal gland: Secondary | ICD-10-CM | POA: Insufficient documentation

## 2019-07-27 DIAGNOSIS — J449 Chronic obstructive pulmonary disease, unspecified: Secondary | ICD-10-CM | POA: Insufficient documentation

## 2019-07-27 DIAGNOSIS — Z7982 Long term (current) use of aspirin: Secondary | ICD-10-CM | POA: Insufficient documentation

## 2019-07-27 DIAGNOSIS — I10 Essential (primary) hypertension: Secondary | ICD-10-CM | POA: Insufficient documentation

## 2019-07-27 LAB — CMP (CANCER CENTER ONLY)
ALT: 21 U/L (ref 10–47)
AST: 18 U/L (ref 11–38)
Albumin: 3.9 g/dL (ref 3.5–5.0)
Alkaline Phosphatase: 84 U/L (ref 38–126)
Anion gap: 8 (ref 5–15)
BUN: 10 mg/dL (ref 8–23)
CO2: 26 mmol/L (ref 22–32)
Calcium: 9.3 mg/dL (ref 8.9–10.3)
Chloride: 96 mmol/L — ABNORMAL LOW (ref 98–111)
Creatinine: 0.56 mg/dL — ABNORMAL LOW (ref 0.60–1.20)
GFR, Est AFR Am: >60 mL/min (ref >60)
GFR, Estimated: >60 mL/min (ref >60)
Glucose, Bld: 106 mg/dL — ABNORMAL HIGH (ref 70–99)
Potassium: 4.5 mmol/L (ref 3.5–5.1)
Sodium: 130 mmol/L — ABNORMAL LOW (ref 135–145)
Total Bilirubin: 0.2 mg/dL (ref 0.2–1.6)
Total Protein: 6.3 g/dL — ABNORMAL LOW (ref 6.5–8.1)

## 2019-07-27 LAB — CBC WITH DIFFERENTIAL (CANCER CENTER ONLY)
Abs Immature Granulocytes: 0.02 10*3/uL (ref 0.00–0.07)
Basophils Absolute: 0 10*3/uL (ref 0.0–0.1)
Basophils Relative: 1 %
Eosinophils Absolute: 0 10*3/uL (ref 0.0–0.5)
Eosinophils Relative: 1 %
HCT: 35.9 % — ABNORMAL LOW (ref 36.0–46.0)
Hemoglobin: 12.4 g/dL (ref 12.0–15.0)
Immature Granulocytes: 0 %
Lymphocytes Relative: 18 %
Lymphs Abs: 0.9 10*3/uL (ref 0.7–4.0)
MCH: 30.8 pg (ref 26.0–34.0)
MCHC: 34.5 g/dL (ref 30.0–36.0)
MCV: 89.1 fL (ref 80.0–100.0)
Monocytes Absolute: 0.8 10*3/uL (ref 0.1–1.0)
Monocytes Relative: 15 %
Neutro Abs: 3.4 10*3/uL (ref 1.7–7.7)
Neutrophils Relative %: 65 %
Platelet Count: 226 10*3/uL (ref 150–400)
RBC: 4.03 MIL/uL (ref 3.87–5.11)
RDW: 12.3 % (ref 11.5–15.5)
WBC Count: 5.1 10*3/uL (ref 4.0–10.5)
nRBC: 0 % (ref 0.0–0.2)

## 2019-07-27 LAB — TSH: TSH: 2.493 u[IU]/mL (ref 0.308–3.960)

## 2019-07-27 MED ORDER — SODIUM CHLORIDE 0.9% FLUSH
10.0000 mL | Freq: Once | INTRAVENOUS | Status: AC
  Administered 2019-07-27: 10 mL
  Filled 2019-07-27: qty 10

## 2019-07-27 MED ORDER — SODIUM CHLORIDE 0.9 % IV SOLN: 10.0000 mg | Freq: Once | INTRAVENOUS | Status: DC

## 2019-07-27 MED ORDER — SODIUM CHLORIDE 0.9 % IV SOLN
Freq: Once | INTRAVENOUS | Status: AC
  Administered 2019-07-27: 10:00:00 via INTRAVENOUS
  Filled 2019-07-27: qty 250

## 2019-07-27 MED ORDER — DEXAMETHASONE SODIUM PHOSPHATE 10 MG/ML IJ SOLN
INTRAMUSCULAR | Status: AC
  Filled 2019-07-27: qty 1

## 2019-07-27 MED ORDER — HEPARIN SOD (PORK) LOCK FLUSH 100 UNIT/ML IV SOLN
500.0000 [IU] | Freq: Once | INTRAVENOUS | Status: AC | PRN
  Administered 2019-07-27: 500 [IU]
  Filled 2019-07-27: qty 5

## 2019-07-27 MED ORDER — PALONOSETRON HCL INJECTION 0.25 MG/5ML
0.2500 mg | Freq: Once | INTRAVENOUS | Status: AC
  Administered 2019-07-27: 0.25 mg via INTRAVENOUS

## 2019-07-27 MED ORDER — SODIUM CHLORIDE 0.9 % IV SOLN
4.0000 mg | Freq: Once | INTRAVENOUS | Status: AC
  Administered 2019-07-27: 4 mg via INTRAVENOUS
  Filled 2019-07-27: qty 8

## 2019-07-27 MED ORDER — SODIUM CHLORIDE 0.9% FLUSH
10.0000 mL | INTRAVENOUS | Status: DC | PRN
  Administered 2019-07-27: 10 mL
  Filled 2019-07-27: qty 10

## 2019-07-27 MED ORDER — PALONOSETRON HCL INJECTION 0.25 MG/5ML
INTRAVENOUS | Status: AC
  Filled 2019-07-27: qty 5

## 2019-07-27 MED ORDER — DEXAMETHASONE SODIUM PHOSPHATE 10 MG/ML IJ SOLN
10.0000 mg | Freq: Once | INTRAMUSCULAR | Status: AC
  Administered 2019-07-27: 10 mg via INTRAVENOUS

## 2019-07-27 NOTE — Progress Notes (Signed)
Confirmed using Zepzelca 3.2mg /m2 - full dose with Dr. Julien Nordmann.  Hardie Pulley, PharmD, BCPS, BCOP

## 2019-07-27 NOTE — Patient Instructions (Signed)
[facility] Discharge Instructions for Patients Receiving Chemotherapy  Today you received the following chemotherapy agents lurbinectedin  To help prevent nausea and vomiting after your treatment, we encourage you to take your nausea medication as directed   If you develop nausea and vomiting that is not controlled by your nausea medication, call the clinic.   BELOW ARE SYMPTOMS THAT SHOULD BE REPORTED IMMEDIATELY:  *FEVER GREATER THAN 100.5 F  *CHILLS WITH OR WITHOUT FEVER  NAUSEA AND VOMITING THAT IS NOT CONTROLLED WITH YOUR NAUSEA MEDICATION  *UNUSUAL SHORTNESS OF BREATH  *UNUSUAL BRUISING OR BLEEDING  TENDERNESS IN MOUTH AND THROAT WITH OR WITHOUT PRESENCE OF ULCERS  *URINARY PROBLEMS  *BOWEL PROBLEMS  UNUSUAL RASH Items with * indicate a potential emergency and should be followed up as soon as possible.  Feel free to call the clinic should you have any questions or concerns. The clinic phone number is [phone].  Please show the [facility] at check-in to the Emergency Department and triage nurse.

## 2019-07-28 ENCOUNTER — Telehealth: Payer: Self-pay | Admitting: *Deleted

## 2019-07-28 ENCOUNTER — Other Ambulatory Visit: Payer: Self-pay

## 2019-07-28 ENCOUNTER — Encounter (HOSPITAL_COMMUNITY): Payer: Self-pay | Admitting: *Deleted

## 2019-07-28 LAB — NOVEL CORONAVIRUS, NAA (HOSP ORDER, SEND-OUT TO REF LAB; TAT 18-24 HRS): SARS-CoV-2, NAA: NOT DETECTED

## 2019-07-28 NOTE — Progress Notes (Signed)
Erroneous encounter

## 2019-07-29 NOTE — Anesthesia Preprocedure Evaluation (Addendum)
Anesthesia Evaluation  Patient identified by MRN, date of birth, ID band Patient awake    Reviewed: Allergy & Precautions, NPO status , Patient's Chart, lab work & pertinent test results  History of Anesthesia Complications Negative for: history of anesthetic complications  Airway Mallampati: II  TM Distance: >3 FB Neck ROM: Full    Dental no notable dental hx. (+) Dental Advisory Given,    Pulmonary shortness of breath, asthma , COPD,  COPD inhaler, Current SmokerPatient did not abstain from smoking., former smoker,  Met Lung Ca   Pulmonary exam normal        Cardiovascular hypertension, Normal cardiovascular exam     Neuro/Psych Anxiety    GI/Hepatic negative GI ROS, Neg liver ROS,   Endo/Other  negative endocrine ROS  Renal/GU negative Renal ROS     Musculoskeletal   Abdominal   Peds  Hematology negative hematology ROS (+)   Anesthesia Other Findings mediastinal, abdominal, adrenal metastasis lymphadenopathy  Reproductive/Obstetrics                            Anesthesia Physical  Anesthesia Plan  ASA: IV  Anesthesia Plan: General   Post-op Pain Management:    Induction: Intravenous  PONV Risk Score and Plan: 3 and Dexamethasone, Ondansetron and Midazolam  Airway Management Planned: Oral ETT  Additional Equipment: None  Intra-op Plan:   Post-operative Plan: Extubation in OR  Informed Consent: I have reviewed the patients History and Physical, chart, labs and discussed the procedure including the risks, benefits and alternatives for the proposed anesthesia with the patient or authorized representative who has indicated his/her understanding and acceptance.     Dental advisory given  Plan Discussed with: CRNA and Anesthesiologist  Anesthesia Plan Comments:        Anesthesia Quick Evaluation

## 2019-07-30 ENCOUNTER — Encounter (HOSPITAL_COMMUNITY): Payer: Self-pay

## 2019-07-30 ENCOUNTER — Other Ambulatory Visit: Payer: Self-pay

## 2019-07-30 ENCOUNTER — Ambulatory Visit (HOSPITAL_COMMUNITY): Payer: Medicare Other | Admitting: Anesthesiology

## 2019-07-30 ENCOUNTER — Ambulatory Visit (HOSPITAL_COMMUNITY)
Admission: RE | Admit: 2019-07-30 | Discharge: 2019-07-30 | Disposition: A | Discharge status: Home / Self Care | Payer: Medicare Other | Source: Ambulatory Visit | Attending: Internal Medicine | Admitting: Internal Medicine

## 2019-07-30 ENCOUNTER — Encounter (HOSPITAL_COMMUNITY): Admission: RE | Disposition: A | Discharge status: Home / Self Care | Payer: Self-pay | Source: Ambulatory Visit

## 2019-07-30 DIAGNOSIS — F419 Anxiety disorder, unspecified: Secondary | ICD-10-CM | POA: Diagnosis not present

## 2019-07-30 DIAGNOSIS — Z79899 Other long term (current) drug therapy: Secondary | ICD-10-CM | POA: Diagnosis not present

## 2019-07-30 DIAGNOSIS — D492 Neoplasm of unspecified behavior of bone, soft tissue, and skin: Secondary | ICD-10-CM | POA: Diagnosis present

## 2019-07-30 DIAGNOSIS — Z85118 Personal history of other malignant neoplasm of bronchus and lung: Secondary | ICD-10-CM | POA: Diagnosis not present

## 2019-07-30 DIAGNOSIS — I10 Essential (primary) hypertension: Secondary | ICD-10-CM | POA: Insufficient documentation

## 2019-07-30 DIAGNOSIS — Z7982 Long term (current) use of aspirin: Secondary | ICD-10-CM | POA: Diagnosis not present

## 2019-07-30 DIAGNOSIS — Z7951 Long term (current) use of inhaled steroids: Secondary | ICD-10-CM | POA: Diagnosis not present

## 2019-07-30 DIAGNOSIS — J449 Chronic obstructive pulmonary disease, unspecified: Secondary | ICD-10-CM | POA: Insufficient documentation

## 2019-07-30 DIAGNOSIS — C7951 Secondary malignant neoplasm of bone: Secondary | ICD-10-CM | POA: Insufficient documentation

## 2019-07-30 DIAGNOSIS — Z87891 Personal history of nicotine dependence: Secondary | ICD-10-CM | POA: Insufficient documentation

## 2019-07-30 DIAGNOSIS — C7949 Secondary malignant neoplasm of other parts of nervous system: Secondary | ICD-10-CM | POA: Diagnosis not present

## 2019-07-30 DIAGNOSIS — Z923 Personal history of irradiation: Secondary | ICD-10-CM | POA: Insufficient documentation

## 2019-07-30 DIAGNOSIS — E785 Hyperlipidemia, unspecified: Secondary | ICD-10-CM | POA: Insufficient documentation

## 2019-07-30 HISTORY — PX: RADIOLOGY WITH ANESTHESIA: SHX6223

## 2019-07-30 SURGERY — MRI WITH ANESTHESIA
Anesthesia: General

## 2019-07-30 MED ORDER — FENTANYL CITRATE (PF) 250 MCG/5ML IJ SOLN
INTRAMUSCULAR | Status: DC | PRN
  Administered 2019-07-30 (×2): 25 ug via INTRAVENOUS

## 2019-07-30 MED ORDER — LACTATED RINGERS IV SOLN: INTRAVENOUS | Status: DC

## 2019-07-30 MED ORDER — PHENYLEPHRINE HCL-NACL 10-0.9 MG/250ML-% IV SOLN
INTRAVENOUS | Status: DC | PRN
  Administered 2019-07-30: 20 ug/min via INTRAVENOUS

## 2019-07-30 MED ORDER — DEXAMETHASONE SODIUM PHOSPHATE 10 MG/ML IJ SOLN
INTRAMUSCULAR | Status: DC | PRN
  Administered 2019-07-30: 5 mg via INTRAVENOUS

## 2019-07-30 MED ORDER — GADOBUTROL 1 MMOL/ML IV SOLN
6.0000 mL | Freq: Once | INTRAVENOUS | Status: AC | PRN
  Administered 2019-07-30: 6 mL via INTRAVENOUS

## 2019-07-30 MED ORDER — ONDANSETRON HCL 4 MG/2ML IJ SOLN
INTRAMUSCULAR | Status: DC | PRN
  Administered 2019-07-30: 4 mg via INTRAVENOUS

## 2019-07-30 MED ORDER — LACTATED RINGERS IV SOLN
INTRAVENOUS | Status: DC | PRN
  Administered 2019-07-30: 10:00:00 via INTRAVENOUS

## 2019-07-30 MED ORDER — LIDOCAINE 2% (20 MG/ML) 5 ML SYRINGE
INTRAMUSCULAR | Status: DC | PRN
  Administered 2019-07-30: 50 mg via INTRAVENOUS

## 2019-07-30 MED ORDER — PROPOFOL 10 MG/ML IV BOLUS
INTRAVENOUS | Status: DC | PRN
  Administered 2019-07-30: 70 mg via INTRAVENOUS

## 2019-07-30 MED ORDER — SUCCINYLCHOLINE CHLORIDE 200 MG/10ML IV SOSY
PREFILLED_SYRINGE | INTRAVENOUS | Status: DC | PRN
  Administered 2019-07-30: 100 mg via INTRAVENOUS

## 2019-07-30 NOTE — Anesthesia Postprocedure Evaluation (Signed)
Anesthesia Post Note  Patient: Pamela Russell  Procedure(s) Performed: MRI WITH ANESTHESIA TOTAL SPINE METS SCREENING (N/A )     Patient location during evaluation: PACU Anesthesia Type: General Level of consciousness: sedated Pain management: pain level controlled Vital Signs Assessment: post-procedure vital signs reviewed and stable Respiratory status: spontaneous breathing and respiratory function stable Cardiovascular status: stable Postop Assessment: no apparent nausea or vomiting Anesthetic complications: no    Last Vitals:  Vitals:   07/30/19 1213 07/30/19 1228  BP: 130/63 137/68  Pulse: 73 79  Resp: 17 20  Temp:    SpO2: 100% 98%    Last Pain:  Vitals:   07/30/19 1200  PainSc: [place]

## 2019-07-30 NOTE — Transfer of Care (Signed)
Immediate Anesthesia Transfer of Care Note  Patient: CHONG JANUARY  Procedure(s) Performed: MRI WITH ANESTHESIA TOTAL SPINE METS SCREENING (N/A )  Patient Location: PACU  Anesthesia Type:General  Level of Consciousness: awake, alert  and oriented  Airway & Oxygen Therapy: Patient Spontanous Breathing and Patient connected to face mask oxygen  Post-op Assessment: Report given to RN and Post -op Vital signs reviewed and stable  Post vital signs: Reviewed and stable  Last Vitals:  Vitals Value Taken Time  BP 151/83   Temp    Pulse 81 07/30/19 1242  Resp 20 07/30/19 1242  SpO2 95 % 07/30/19 1242  Vitals shown include unvalidated device data.  Last Pain:  Vitals:   07/30/19 1200  PainSc: Asleep      Patients Stated Pain Goal: 3 (70/35/00 9381)  Complications: No apparent anesthesia complications

## 2019-07-30 NOTE — Anesthesia Procedure Notes (Signed)
Procedure Name: Intubation Date/Time: 07/30/2019 10:07 AM Performed by: Alain Marion, CRNA Pre-anesthesia Checklist: Patient identified, Emergency Drugs available, Suction available and Patient being monitored Patient Re-evaluated:Patient Re-evaluated prior to induction Oxygen Delivery Method: Circle System Utilized Preoxygenation: Pre-oxygenation with 100% oxygen Induction Type: IV induction Laryngoscope Size: Miller and 2 Grade View: Grade I Tube type: Oral Tube size: 7.0 mm Number of attempts: 1 Airway Equipment and Method: Stylet Placement Confirmation: ETT inserted through vocal cords under direct vision,  positive ETCO2 and breath sounds checked- equal and bilateral Secured at: 20 cm Tube secured with: Tape Dental Injury: Teeth and Oropharynx as per pre-operative assessment

## 2019-08-03 ENCOUNTER — Inpatient Hospital Stay: Payer: Medicare Other

## 2019-08-04 ENCOUNTER — Telehealth: Payer: Self-pay | Admitting: Internal Medicine

## 2019-08-04 NOTE — Telephone Encounter (Signed)
  This patient was discussed at interdisciplinary tumor board on Monday, December 14.   Introduction to the role of palliative medicine into a holistic treatment plan was recommended by the team.  Patient's daughter Mont Dutton called Dr. Renda Rolls office with questions regarding palliative services.  I personally spoke to the patient's daughter Mont Dutton to introduce myself and the palliative medicine team at [facility].  Created space and opportunity for daughter to explore her thoughts and feelings regarding her mother's current medical situation.  She understands the seriousness of her mother's current medical situation.  She verbalizes apprehension regarding the future and the what if's.  Plan is to meet with Dr. Marin Comment oncology this coming Friday.  At that time they are hoping to get a clearer picture of the current medical situation and long-term prognosis.  Patient's daughter verbalizes desire for realistic information so her mother "does not get blindsided".  Ms. Loreli Slot gratitude for today's conversation.  I offered to meet with those herself and her mother anytime at their convenience for continued conversation regarding goals of care, after their meeting with Dr. Mickeal Skinner.   Encourage consideration for advanced care planning and healthcare power of attorney.  Encouraged daughter to call me with questions or concerns and to schedule a meeting at a future time.  Wadie Lessen  NP Palliative Medicine Team # [phone]   No charge

## 2019-08-06 ENCOUNTER — Ambulatory Visit: Payer: Medicare Other | Admitting: Internal Medicine

## 2019-08-07 ENCOUNTER — Other Ambulatory Visit: Payer: Self-pay

## 2019-08-07 ENCOUNTER — Inpatient Hospital Stay (HOSPITAL_BASED_OUTPATIENT_CLINIC_OR_DEPARTMENT_OTHER): Payer: Medicare Other | Admitting: Internal Medicine

## 2019-08-07 VITALS — BP 153/97 | HR 99 | Temp 100.0°F | Resp 17 | Ht 64.0 in | Wt 83.6 lb

## 2019-08-07 DIAGNOSIS — C7949 Secondary malignant neoplasm of other parts of nervous system: Secondary | ICD-10-CM | POA: Diagnosis not present

## 2019-08-07 DIAGNOSIS — Z5111 Encounter for antineoplastic chemotherapy: Secondary | ICD-10-CM | POA: Diagnosis not present

## 2019-08-07 MED ORDER — LUMBAR BACK BRACE/SUPPORT PAD MISC: 0 refills | Status: AC

## 2019-08-07 NOTE — Progress Notes (Signed)
[facility] [place] [place], [place] [address] [phone]   Interval Evaluation  Date of Service: 08/07/19 Patient Name: Pamela Russell Patient MRN: [id number] Patient DOB: 10-30-1945 Provider: Ventura Sellers, MD  Identifying Statement:  Pamela Russell is a 73 y.o. 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 [facility])   Primary Cancer Oncologic History: Oncology History  Small cell lung cancer, right lower lobe ([place])  02/19/2019 Initial Diagnosis   Small cell lung cancer, right lower lobe ([place])   03/02/2019 - 07/19/2019 Chemotherapy   The patient had palonosetron (ALOXI) injection 0.25 mg, 0.25 mg, Intravenous,  Once, 4 of 4 cycles Administration: 0.25 mg (03/02/2019), 0.25 mg (05/04/2019), 0.25 mg (03/23/2019), 0.25 mg (04/13/2019) pegfilgrastim-jmdb (FULPHILA) injection 6 mg, 6 mg, Subcutaneous,  Once, 1 of 1 cycle pegfilgrastim-cbqv (UDENYCA) injection 6 mg, 6 mg, Subcutaneous, Once, 4 of 4 cycles Administration: 6 mg (03/06/2019), 6 mg (03/27/2019), 6 mg (05/08/2019), 6 mg (04/17/2019) CARBOplatin (PARAPLATIN) 310 mg in sodium chloride 0.9 % 250 mL chemo infusion, 310 mg (100 % of original dose 306.5 mg), Intravenous,  Once, 4 of 4 cycles Dose modification: 306.5 mg (original dose 306.5 mg, Cycle 1), 306.5 mg (original dose 306.5 mg, Cycle 4), 306.5 mg (original dose 306.5 mg, Cycle 2), 306.5 mg (original dose 306.5 mg, Cycle 3),   (original dose 306.5 mg, Cycle 4) Administration: 310 mg (03/02/2019), 310 mg (03/23/2019), 310 mg (04/13/2019), 310 mg (05/04/2019) etoposide (VEPESID) 140 mg in sodium chloride 0.9 % 500 mL chemo infusion, 100 mg/m2 = 140 mg, Intravenous,  Once, 4 of 4 cycles Administration: 140 mg (03/02/2019), 140 mg (03/03/2019), 140 mg (03/04/2019), 140 mg (05/04/2019), 140 mg (05/05/2019), 140 mg (05/06/2019), 140 mg (03/23/2019), 140 mg (03/24/2019), 140 mg (03/25/2019), 140 mg (04/13/2019), 140 mg (04/14/2019), 140 mg (04/15/2019) fosaprepitant  (EMEND) 150 mg, dexamethasone (DECADRON) 12 mg in sodium chloride 0.9 % 145 mL IVPB, , Intravenous,  Once, 4 of 4 cycles Administration:  (03/02/2019),  (05/04/2019),  (03/23/2019),  (04/13/2019) durvalumab (IMFINZI) 1,500 mg in sodium chloride 0.9 % 100 mL chemo infusion, 1,500 mg, Intravenous,  Once, 6 of 10 cycles Administration: 1,500 mg (03/02/2019), 1,500 mg (05/04/2019), 1,500 mg (05/25/2019), 1,500 mg (03/23/2019), 1,500 mg (04/13/2019), 1,500 mg (06/22/2019)  for chemotherapy treatment.    07/27/2019 -  Chemotherapy   The patient had palonosetron (ALOXI) injection 0.25 mg, 0.25 mg, Intravenous,  Once, 1 of 6 cycles Administration: 0.25 mg (07/27/2019) lurbinectedin (ZEPZELCA) 4 mg in sodium chloride 0.9 % 250 mL chemo infusion, 4.15 mg, Intravenous,  Once, 1 of 6 cycles Administration: 4 mg (07/27/2019)  for chemotherapy treatment.     CNS Oncologic History 07/13/19: Completes WBRT Pamela Russell)   Interval History:  Pamela Russell presents for follow up today after recent MRI of total spine.  She continues to describe imbalance and generalized weakness, though not much worse from prior.  Low/mid back pain is more problematic (currently 4/10) and toradol has been only moderately effective. Recently started on new salvage chemotherapy regimen. She otherwise describes no new or progressive neurologic deficits.  Denies seizures or headaches.   H+P (04/10/19) Patient presents today to discuss visual impairment in context of abnormalities on recent brain imaging.  She describes gradual onset of double vision, described as "objects side by side or diagonal".  This probably began in the spring prior to diagnosis of small cell lung cancer and subsequent treatments, and has progressed somewhat.  Double vision is clearly worsened by looking "  close in".  She also has noticed some drooping of the left eyelid, as well as twitching of the eyelid.  She is not sure if these symptoms have progressed as well.  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  Overall her chemotherapy has not been well tolerated, causing weight loss, low energy, poor appetite and hair loss.  Was intially on decadron for brain met though this has been weaned off. Describes balance as "poor" she sometimes needs a walker to ambulate.    Medications: Current Outpatient Medications on File Prior to Visit  Medication Sig Dispense Refill  . albuterol (VENTOLIN HFA) 108 (90 Base) MCG/ACT inhaler Inhale 2 puffs into the lungs every 4 (four) hours as needed (wheezing/shortness of breath.). (Patient taking differently: Inhale 2 puffs into the lungs every 6 (six) hours as needed (wheezing/shortness of breath.). ) 18 g 0  . aspirin EC 81 MG tablet Take 81 mg by mouth daily.    . Budeson-Glycopyrrol-Formoterol (BREZTRI AEROSPHERE) 160-9-4.8 MCG/ACT AERO Inhale 2 puffs into the lungs 2 (two) times daily. 10.7 g 0  . dimenhyDRINATE (DRAMAMINE) 50 MG tablet Take 50 mg by mouth every 8 (eight) hours as needed (driving).    . diphenhydrAMINE (BENADRYL) 25 mg capsule Take 25 mg by mouth daily.    . fluticasone (FLONASE) 50 MCG/ACT nasal spray USE 2 SPRAYS IN BOTH  NOSTRILS DAILY (Patient taking differently: Place 1 spray into both nostrils 2 (two) times daily. ) 32 g 1  . Fluticasone-Umeclidin-Vilant (TRELEGY ELLIPTA) 100-62.5-25 MCG/INH AEPB Inhale 1 puff into the lungs daily. (Patient not taking: Reported on 07/22/2019) 60 each 3  . Glucosamine Sulfate 1000 MG CAPS Take 1,000 mg by mouth daily.    Marland Kitchen guaiFENesin (MUCINEX) 600 MG 12 hr tablet Take 600 mg by mouth 2 (two) times daily.    Marland Kitchen ibuprofen (ADVIL) 200 MG tablet Take 400 mg by mouth every 6 (six) hours as needed for moderate pain.    Marland Kitchen ketorolac (TORADOL) 10 MG tablet Take 1 tablet (10 mg total) by mouth every 8 (eight) hours. (Patient taking differently: Take 10 mg by mouth every 8 (eight) hours as needed (pain). ) 120 tablet 0  . lidocaine-prilocaine  (EMLA) cream Apply 1 application topically daily as needed (port).     . LORazepam (ATIVAN) 0.5 MG tablet 1 tab po q 4-6 hours prn or 1 tab po 30 minutes prior to radiation (Patient not taking: Reported on 07/22/2019) 30 tablet 0  . mometasone (ELOCON) 0.1 % lotion Apply 1 application topically daily. Applied to ears daily    . Multiple Vitamin (MULTIVITAMIN WITH MINERALS) TABS tablet Take 1 tablet by mouth daily.    . prochlorperazine (COMPAZINE) 10 MG tablet TAKE 1 TABLET BY MOUTH EVERY 6 HOURS AS NEEDED FOR NAUSEA AND VOMITING (Patient taking differently: Take 10 mg by mouth every 6 (six) hours as needed for nausea or vomiting. ) 60 tablet 1  . Sennosides 25 MG TABS Take 25 mg by mouth every other day.    . sodium chloride (OCEAN) 0.65 % SOLN nasal spray Place 1 spray into both nostrils as needed for congestion.    . [DISCONTINUED] prochlorperazine (COMPAZINE) 10 MG tablet Take 1 tablet (10 mg total) by mouth every 6 (six) hours as needed for nausea or vomiting. 30 tablet 0   No current facility-administered medications on file prior to visit.    Allergies:  Allergies  Allergen Reactions  . Codeine  Anaphylaxis and Swelling  . Penicillins Anaphylaxis and Swelling    Childhood reaction. Has patient had a PCN reaction causing immediate rash, facial/tongue/throat swelling, SOB or lightheadedness with hypotension: Yes Has patient had a PCN reaction causing severe rash involving mucus membranes or skin necrosis: No Has patient had a PCN reaction that required hospitalization: No Has patient had a PCN reaction occurring within the last 10 years: No If all of the above answers are "NO", then may proceed with Cephalosporin use.   Celedonio Miyamoto [Umeclidinium-Vilanterol] Nausea And Vomiting    Severe    Past Medical History:  Past Medical History:  Diagnosis Date  . Allergic rhinitis   . Anxiety    panic attack 02/2019- seen in ED - it presented as chest pain  . Asthma 2008  . Chronic  obstructive pulmonary disease (COPD) ([place]) 06/08/2016   Meds: Spiriva, albuterol inhaler PFT (11/29/2016): Moderate-severe obstruction by PFT 11/2016  . Dyspnea 06/15/2019   with walking - pulse ox- stay 94- 97%  . Ear itching 07/05/2017  . Grieving 06/08/2016  . History of pneumonia    x4  . Hyperlipidemia 09/10/2016   On atorvastatin 40 mg QD, ASA 81 mg QD ASCVD risk 17.1%, mod-high intensity statin Risk factors: FHx sig for MI: mother 57s-50s, and father age 51, HTN, smoker  . Hypertension    no problem  . Night sweats 06/08/2016  . NSCL Ca dx'd 05/2018   xrt comp 09/2018- Lung has had radiotion and chemo; Brain Metastasis, Adrenal Metastasis  . Pneumonia     3 times last time 2017 ish  . Pulmonary nodules 06/12/2017  . Tobacco use disorder 07/06/2016   On nicotine replacement patch. Smoking cigs since age 20. Has 60 pack year history. Has COPD dx on SABA and LAMA.   Past Surgical History:  Past Surgical History:  Procedure Laterality Date  . CATARACT EXTRACTION Left 05/08/2016  . CATARACT EXTRACTION Right 05/22/2016  . IR IMAGING GUIDED PORT INSERTION  02/25/2019  . RADIOLOGY WITH ANESTHESIA N/A 02/13/2019   Procedure: BRAIN MRI;  Surgeon: Radiologist, Medication, MD;  Location: [facility];  Service: Radiology;  Laterality: N/A;  . RADIOLOGY WITH ANESTHESIA N/A 06/16/2019   Procedure: MRI WITH ANESTHESIA OF BRAIN WITH AND WITHOUT CONTRAST;  Surgeon: Radiologist, Medication, MD;  Location: [facility];  Service: Radiology;  Laterality: N/A;  . RADIOLOGY WITH ANESTHESIA N/A 07/30/2019   Procedure: MRI WITH ANESTHESIA TOTAL SPINE METS SCREENING;  Surgeon: Radiologist, Medication, MD;  Location: [facility];  Service: Radiology;  Laterality: N/A;  . TOTAL ABDOMINAL HYSTERECTOMY W/ BILATERAL SALPINGOOPHORECTOMY Bilateral 1975  . VIDEO BRONCHOSCOPY WITH ENDOBRONCHIAL NAVIGATION Left 07/09/2018   Procedure: VIDEO BRONCHOSCOPY WITH ENDOBRONCHIAL NAVIGATION;  Surgeon: Collene Gobble, MD;  Location: [facility];   Service: Thoracic;  Laterality: Left;   Social History:  Social History   Socioeconomic History  . Marital status: Widowed    Spouse name: Not on file  . Number of children: 2  . Years of education: Not on file  . Highest education level: Not on file  Occupational History  . Not on file  Tobacco Use  . Smoking status: Former Smoker    Packs/day: 1.00    Years: 60.00    Pack years: 60.00    Types: Cigarettes    Start date: 08/21/1955    Quit date: 01/28/2019    Years since quitting: 0.5  . Smokeless tobacco: Never Used  . Tobacco comment: 3 ppd for 20+ years, has cut down  to 0.5 ppd, in the past 3 months, no cigs in 1 month  Substance and Sexual Activity  . Alcohol use: Yes    Alcohol/week: 1.0 standard drinks    Types: 1 Cans of beer per week    Comment: maybe every month  . Drug use: No  . Sexual activity: Not on file  Other Topics Concern  . Not on file  Social History Narrative  . Not on file   Social Determinants of Health   Financial Resource Strain:   . Difficulty of Paying Living Expenses: Not on file  Food Insecurity:   . Worried About Charity fundraiser in the Last Year: Not on file  . Ran Out of Food in the Last Year: Not on file  Transportation Needs: No Transportation Needs  . Lack of Transportation (Medical): No  . Lack of Transportation (Non-Medical): No  Physical Activity:   . Days of Exercise per Week: Not on file  . Minutes of Exercise per Session: Not on file  Stress:   . Feeling of Stress : Not on file  Social Connections:   . Frequency of Communication with Friends and Family: Not on file  . Frequency of Social Gatherings with Friends and Family: Not on file  . Attends Religious Services: Not on file  . Active Member of Clubs or Organizations: Not on file  . Attends Archivist Meetings: Not on file  . Marital Status: Not on file  Intimate Partner Violence:   . Fear of Current or Ex-Partner: Not on file  . Emotionally Abused: Not  on file  . Physically Abused: Not on file  . Sexually Abused: Not on file   Family History:  Family History  Problem Relation Age of Onset  . Heart disease Mother        MI in her 35's  . Stroke Mother   . Heart disease Father        CABG  . Hypertension Father   . Heart disease Sister 41       CABG  . Stroke Sister   . Diabetes Sister     Review of Systems: Constitutional:+weight loss Eyes: Denies blurriness of vision Ears, nose, mouth, throat, and face: Denies mucositis or sore throat Respiratory: +shortness of breath Cardiovascular: Denies palpitation, chest discomfort or lower extremity swelling Gastrointestinal:  Denies nausea GU: Denies dysuria or incontinence Skin: Denies abnormal skin rashes Neurological: Per HPI Musculoskeletal: Denies joint pain, back or neck discomfort. No decrease in ROM Behavioral/Psych: denies depressed mood  Physical Exam: Vitals:   08/07/19 1108  BP: (!) 153/97  Pulse: 99  Resp: 17  Temp: 100 F (37.8 C)   KPS: 70. General: Alert, cooperative, pleasant, but thin/cachectic Head: Normal EENT: No conjunctival injection or scleral icterus. Oral mucosa moist Lungs: Tachypnic, respiratory effort increased Cardiac: Regular rate and rhythm Abdomen: Soft, non-distended abdomen Skin: No rashes cyanosis or petechiae. Extremities: No clubbing or edema  Neurologic Exam: Mental Status: Awake, alert, attentive to examiner. Oriented to self and environment. Language is fluent with intact comprehension.  Cranial Nerves: Visual acuity is grossly normal. Visual fields are full. Extra-ocular movements intact. Face is symmetric, tongue midline. Motor: Tone and bulk are normal. Power is full in both arms and legs. Reflexes are symmetric, no pathologic reflexes present. Intact finger to nose bilaterally Sensory: Intact to light touch and temperature Gait: Normal and tandem gait is deferred  Labs: I have reviewed the data as listed    Component  Value Date/Time   NA 130 (L) 07/27/2019 0858   NA 130 (L) 01/26/2019 1153   K 4.5 07/27/2019 0858   CL 96 (L) 07/27/2019 0858   CO2 26 07/27/2019 0858   GLUCOSE 106 (H) 07/27/2019 0858   BUN 10 07/27/2019 0858   BUN 5 (L) 01/26/2019 1153   CREATININE 0.56 (L) 07/27/2019 0858   CREATININE 0.69 06/08/2016 1128   CALCIUM 9.3 07/27/2019 0858   PROT 6.3 (L) 07/27/2019 0858   PROT 7.1 05/27/2017 1458   ALBUMIN 3.9 07/27/2019 0858   ALBUMIN 4.9 (H) 05/27/2017 1458   AST 18 07/27/2019 0858   ALT 21 07/27/2019 0858   ALKPHOS 84 07/27/2019 0858   BILITOT 0.2 07/27/2019 0858   GFRNONAA >60 07/27/2019 0858   GFRNONAA 89 06/08/2016 1128   GFRAA >60 07/27/2019 0858   GFRAA >89 06/08/2016 1128   Lab Results  Component Value Date   WBC 5.1 07/27/2019   NEUTROABS 3.4 07/27/2019   HGB 12.4 07/27/2019   HCT 35.9 (L) 07/27/2019   MCV 89.1 07/27/2019   PLT 226 07/27/2019    Imaging:  CT ANGIO CHEST PE W OR WO CONTRAST  Result Date: 07/17/2019 CLINICAL DATA:  Restaging of right upper lobe small cell lung cancer. Recent completion of radiation therapy. Dyspnea on exertion. EXAM: CT ANGIOGRAPHY CHEST CT ABDOMEN AND PELVIS WITH CONTRAST TECHNIQUE: Multidetector CT imaging of the chest was performed using the standard protocol during bolus administration of intravenous contrast. Multiplanar CT image reconstructions and MIPs were obtained to evaluate the vascular anatomy. Multidetector CT imaging of the abdomen and pelvis was performed using the standard protocol during bolus administration of intravenous contrast. CONTRAST:  26m OMNIPAQUE IOHEXOL 350 MG/ML SOLN COMPARISON:  06/11/2019 FINDINGS: CTA CHEST FINDINGS Cardiovascular: No filling defect is identified in the pulmonary arterial tree to suggest pulmonary embolus. Coronary, aortic arch, and branch vessel atherosclerotic vascular disease. Focal myocardial thinning at the left ventricular apex, possible 0.6 cm pseudoaneurysm in this vicinity on  image 36/6. If present previously this was not readily appreciable due to differences in exam technique. Mediastinum/Nodes: A new ill-defined nodularity in the right pericardial adipose tissue measures 1.3 cm in short axis on image 74/8 and may represent a pericardial lymph node. Another right anterior pericardial node measures 0.8 cm in short axis on image 75/8, formerly 0.3 cm. Lungs/Pleura: Biapical pleuroparenchymal scarring with calcification. Sub solid right upper lobe nodule 0.6 cm in diameter on image 41/10, stable. Mild increase in the hazy density at the right lung apex for example on image 22/10. Ground-glass density nodule 1.7 cm in long axis on image 48/10 in the right upper lobe, stable by my measurements. A right lower lobe nodule is blurred by motion artifact but measures 4 by 3 mm on image 114/10, not appreciated previously. There is some airway plugging in the left lower lobe example on image 84/10, stable appearance to prior exam. A left lower lobe nodule medially on image 78/10 measures 0.8 by 0.5 cm, and is new compared to 05/22/2019. Musculoskeletal: Three stable sclerotic lesions are observed. This includes a 1 cm sclerotic lesion eccentric to the right in the T1 vertebral body, stable. 0.4 cm sclerotic lesion at T3, stable. Small nonspecific rim sclerotic lesion in the sternal manubrium about 6 mm in diameter on image 64/9, stable. 90% compression fracture at T5 and 40% compression fracture at T7, similar to the prior exam. Mildly worsened 30% inferior endplate compression fracture at T6. Review of the MIP images confirms the  above findings. CT ABDOMEN and PELVIS FINDINGS Hepatobiliary: Unremarkable Pancreas: Interposed between the pancreas and the left hepatic lobe and abutting adjacent vessel such as the celiac artery and common hepatic artery, we demonstrate a 4.3 by 1.9 by 3.6 cm soft tissue density mass. This probably represents adenopathy but is difficult to separate from the pancreas.  The pancreas appears otherwise normal. Spleen: Unremarkable Adrenals/Urinary Tract: Adrenal glands unremarkable. Pelvic position of the right kidney is again observed, with mild right hydronephrosis potentially due to narrowing at the UPJ based on the acute angulation at the UPJ. Mild scarring of the left kidney upper pole. Urinary bladder is empty. Stomach/Bowel: Sigmoid colon diverticulosis. Vascular/Lymphatic: Aortoiliac atherosclerotic vascular disease. Aortocaval node 0.9 cm in short axis on image 19/11, formerly 0.3 cm. Retrocaval node 0.9 cm in short axis on image 26/11, previously not present. Small left common and external iliac lymph nodes present such as a 0.8 cm left common iliac node on image 34/11, previously not visible. Reproductive: Prior hysterectomy. Adnexa unremarkable. Other: No supplemental non-categorized findings. Musculoskeletal: New 30% superior endplate compression fracture at L2. Review of the MIP images confirms the above findings. IMPRESSION: 1. Progressive adenopathy especially along the porta hepatis and peripancreatic region compatible with malignancy. There is some new pericardial adenopathy as well as borderline enlarged but newly prominent retroperitoneal and upper pelvic lymph nodes concerning for malignancy. 2. Similar appearance of ground-glass density nodules in the right lung. 3. No filling defect is identified in the pulmonary arterial tree to suggest pulmonary embolus. 4. Focal myocardial thinning of the left ventricular apex, potentially a small pseudoaneurysm. 5. New single pulmonary nodules in the lower lobes, early metastatic spread not excluded. 6. Three stable sclerotic lesions along the thorax, nonspecific with regard to active malignancy. 7. Stable 90% compression fracture at T5 and stable 40% compression fracture at T7. Mildly worsened 30% inferior endplate compression fracture at T6 and new 30% superior endplate compression fracture at L2. Appearance compatible  with osteoporosis. 8. Other imaging findings of potential clinical significance: Coronary atherosclerosis. Pelvic position of the right kidney with mild right hydronephrosis potentially due to narrowing at the UPJ. Sigmoid colon diverticulosis. Aortic Atherosclerosis (ICD10-I70.0). Electronically Signed   By: Van Clines M.D.   On: 07/17/2019 15:46   CT Abdomen Pelvis W Contrast  Result Date: 07/17/2019 CLINICAL DATA:  Restaging of right upper lobe small cell lung cancer. Recent completion of radiation therapy. Dyspnea on exertion. EXAM: CT ANGIOGRAPHY CHEST CT ABDOMEN AND PELVIS WITH CONTRAST TECHNIQUE: Multidetector CT imaging of the chest was performed using the standard protocol during bolus administration of intravenous contrast. Multiplanar CT image reconstructions and MIPs were obtained to evaluate the vascular anatomy. Multidetector CT imaging of the abdomen and pelvis was performed using the standard protocol during bolus administration of intravenous contrast. CONTRAST:  87m OMNIPAQUE IOHEXOL 350 MG/ML SOLN COMPARISON:  06/11/2019 FINDINGS: CTA CHEST FINDINGS Cardiovascular: No filling defect is identified in the pulmonary arterial tree to suggest pulmonary embolus. Coronary, aortic arch, and branch vessel atherosclerotic vascular disease. Focal myocardial thinning at the left ventricular apex, possible 0.6 cm pseudoaneurysm in this vicinity on image 36/6. If present previously this was not readily appreciable due to differences in exam technique. Mediastinum/Nodes: A new ill-defined nodularity in the right pericardial adipose tissue measures 1.3 cm in short axis on image 74/8 and may represent a pericardial lymph node. Another right anterior pericardial node measures 0.8 cm in short axis on image 75/8, formerly 0.3 cm. Lungs/Pleura: Biapical pleuroparenchymal scarring with calcification.  Sub solid right upper lobe nodule 0.6 cm in diameter on image 41/10, stable. Mild increase in the hazy  density at the right lung apex for example on image 22/10. Ground-glass density nodule 1.7 cm in long axis on image 48/10 in the right upper lobe, stable by my measurements. A right lower lobe nodule is blurred by motion artifact but measures 4 by 3 mm on image 114/10, not appreciated previously. There is some airway plugging in the left lower lobe example on image 84/10, stable appearance to prior exam. A left lower lobe nodule medially on image 78/10 measures 0.8 by 0.5 cm, and is new compared to 05/22/2019. Musculoskeletal: Three stable sclerotic lesions are observed. This includes a 1 cm sclerotic lesion eccentric to the right in the T1 vertebral body, stable. 0.4 cm sclerotic lesion at T3, stable. Small nonspecific rim sclerotic lesion in the sternal manubrium about 6 mm in diameter on image 64/9, stable. 90% compression fracture at T5 and 40% compression fracture at T7, similar to the prior exam. Mildly worsened 30% inferior endplate compression fracture at T6. Review of the MIP images confirms the above findings. CT ABDOMEN and PELVIS FINDINGS Hepatobiliary: Unremarkable Pancreas: Interposed between the pancreas and the left hepatic lobe and abutting adjacent vessel such as the celiac artery and common hepatic artery, we demonstrate a 4.3 by 1.9 by 3.6 cm soft tissue density mass. This probably represents adenopathy but is difficult to separate from the pancreas. The pancreas appears otherwise normal. Spleen: Unremarkable Adrenals/Urinary Tract: Adrenal glands unremarkable. Pelvic position of the right kidney is again observed, with mild right hydronephrosis potentially due to narrowing at the UPJ based on the acute angulation at the UPJ. Mild scarring of the left kidney upper pole. Urinary bladder is empty. Stomach/Bowel: Sigmoid colon diverticulosis. Vascular/Lymphatic: Aortoiliac atherosclerotic vascular disease. Aortocaval node 0.9 cm in short axis on image 19/11, formerly 0.3 cm. Retrocaval node 0.9 cm  in short axis on image 26/11, previously not present. Small left common and external iliac lymph nodes present such as a 0.8 cm left common iliac node on image 34/11, previously not visible. Reproductive: Prior hysterectomy. Adnexa unremarkable. Other: No supplemental non-categorized findings. Musculoskeletal: New 30% superior endplate compression fracture at L2. Review of the MIP images confirms the above findings. IMPRESSION: 1. Progressive adenopathy especially along the porta hepatis and peripancreatic region compatible with malignancy. There is some new pericardial adenopathy as well as borderline enlarged but newly prominent retroperitoneal and upper pelvic lymph nodes concerning for malignancy. 2. Similar appearance of ground-glass density nodules in the right lung. 3. No filling defect is identified in the pulmonary arterial tree to suggest pulmonary embolus. 4. Focal myocardial thinning of the left ventricular apex, potentially a small pseudoaneurysm. 5. New single pulmonary nodules in the lower lobes, early metastatic spread not excluded. 6. Three stable sclerotic lesions along the thorax, nonspecific with regard to active malignancy. 7. Stable 90% compression fracture at T5 and stable 40% compression fracture at T7. Mildly worsened 30% inferior endplate compression fracture at T6 and new 30% superior endplate compression fracture at L2. Appearance compatible with osteoporosis. 8. Other imaging findings of potential clinical significance: Coronary atherosclerosis. Pelvic position of the right kidney with mild right hydronephrosis potentially due to narrowing at the UPJ. Sigmoid colon diverticulosis. Aortic Atherosclerosis (ICD10-I70.0). Electronically Signed   By: Van Clines M.D.   On: 07/17/2019 15:46   MR TOTAL SPINE METS SCREENING  Result Date: 07/30/2019 CLINICAL DATA:  Metastatic lung cancer. Staging. Compression fracture shown by  CT on 07/17/2019 EXAM: MRI TOTAL SPINE WITHOUT AND WITH  CONTRAST TECHNIQUE: Multisequence MR imaging of the spine from the cervical spine to the sacrum was performed prior to and following IV contrast administration for evaluation of spinal metastatic disease. CONTRAST:  7m GADAVIST GADOBUTROL 1 MMOL/ML IV SOLN COMPARISON:  CT 07/17/2019 FINDINGS: MRI CERVICAL SPINE FINDINGS Alignment: Normal Vertebrae: Normal Cord: Normal Posterior Fossa, vertebral arteries, paraspinal tissues: Negative Disc levels: Mild non-compressive cervical spondylosis. MRI THORACIC SPINE FINDINGS Alignment:  Increased kyphotic curvature due to fractures. Vertebrae: Marrow space focus in the right side of the T1 vertebral body as shown by previous imaging, indeterminate for benign versus malignant. Lack of significant change over time favors benign nature. Vertebra plana at T5 as seen previously. Inferior endplate fracture at T6 without progression. Mild residual edema. Superior endplate fracture at T7 without compression. Mild residual edema. All of these fractures look like benign fractures. Superior endplate fracture at TD63not visible on the previous CT and therefore new. This also looks benign. Loss of height of only about 10%. Cord: Leptomeningeal disease in the lower thoracic region, better seen at lumbar MRI and described below. Paraspinal and other soft tissues: See results of chest CT. Disc levels: No significant degenerative changes. Ordinary disc bulges and facet osteoarthritis. MRI LUMBAR SPINE FINDINGS Segmentation:  5 lumbar type vertebral bodies. Alignment:  Normal Vertebrae: Recent superior endplate fracture at L2 with edema. This looks like a benign fracture. Loss of height of about 10%. No sign of metastatic lesion in the lumbar or sacral region. Mild discogenic edema at the inferior endplate of L4 associated with a small Schmorl's node. Conus medullaris: Extends to the L1 level. Leptomeningeal metastatic disease in the region from T11 below. This includes 5 mm metastatic  nodular deposits at the L1 level. Other small nodular foci along the nerve roots. Paraspinal and other soft tissues: Otherwise negative Disc levels: L4-5 disc bulge with mild stenosis of the lateral recesses. 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 Nodular metastatic deposits along the surface of the cord and along the nerve roots. Nodular deposits are largest at the L1 level where there are 2 deposits measuring about 5 mm. Compression fractures at T5, T6, T7, T11 and L2 which look benign. Edema and enhancement at several of the levels indicating ongoing healing. T11 fracture is new since the CT scan of 07/17/2019. Discogenic endplate enhancement at L4-5. Nonspecific marrow space focus on the right side of the T1 vertebral body. This does not appear to have changed since last year and is therefore likely benign. Electronically Signed   By: MNelson ChimesM.D.   On: 07/30/2019 15:01    CSouth HooksettClinician Interpretation: I have personally reviewed the CNS specific radiological images as listed.  My interpretation, in the context of the patient's clinical presentation, is progression in leptomeningeal pattern   Assessment/Plan Le[place]eal [place]  Ms. Micheal presents today with clinical and radiographic picture consistent with progressive leptomeningeal deposits within thecal space.  There are both nodular and pial "sugarcoating" features on imaging.    We again extensively discussed clinical significant and treatment pathways for LMD, including consideration of palliative/hospice referral.  She understands the condition is not curable, and CNS directed treatments would be essentially palliative in nature.  If interested, we could refer her for focal radiation or radiosurgery to active sites within the spine.  Do not recommend any intrathecal chemotherapy.  She would like to think over our  discussion today and review options  with her family.  For back pain, she is limited by listed allergy to opiates.  We will order a DME lower brace given likely contribution of lumbar compression fractures.  We will plan on reaching out to her via phone early next week.    We appreciate the opportunity to participate in the care of Pamela Russell.    All questions were answered. The patient knows to call the clinic with any problems, questions or concerns. No barriers to learning were detected.  The total time spent in the encounter was 40 minutes and more than 50% was on counseling and review of test results   Ventura Sellers, MD Medical Director of Neuro-Oncology [facility] [place] 08/07/19 10:55 AM

## 2019-08-08 ENCOUNTER — Telehealth: Payer: Self-pay | Admitting: Internal Medicine

## 2019-08-08 NOTE — Telephone Encounter (Signed)
Scheduled appt per 12/18 los.  Spoke with pt daughter and she is aware of the appt date and time.

## 2019-08-10 ENCOUNTER — Inpatient Hospital Stay (HOSPITAL_BASED_OUTPATIENT_CLINIC_OR_DEPARTMENT_OTHER): Payer: Medicare Other | Admitting: Internal Medicine

## 2019-08-10 DIAGNOSIS — C3411 Malignant neoplasm of upper lobe, right bronchus or lung: Secondary | ICD-10-CM | POA: Diagnosis not present

## 2019-08-10 DIAGNOSIS — C7931 Secondary malignant neoplasm of brain: Secondary | ICD-10-CM

## 2019-08-10 DIAGNOSIS — C7949 Secondary malignant neoplasm of other parts of nervous system: Secondary | ICD-10-CM | POA: Diagnosis not present

## 2019-08-10 NOTE — Progress Notes (Signed)
I connected with Pamela Russell on 08/10/19 at 11:00 AM EST by telephone visit and verified that I am speaking with the correct person using two identifiers.  I discussed the limitations, risks, security and privacy concerns of performing an evaluation and management service by telemedicine and the availability of in-person appointments. I also discussed with the patient that there may be a patient responsible charge related to this service. The patient expressed understanding and agreed to proceed.  Other persons participating in the visit and their role in the encounter:  n/a  Patient's location:  Home  Provider's location:  Office  Chief Complaint:  Leptomeningeal Metastases  History of Present Ilness: Pamela Russell has no new complaints today.  She has spent time this weekend discussing with her family her desire to stop therapy given her recent progression in the spine and lack of curative treatment options.  She expresses a desire to transition to hospice care at this time. Observations: Language and cognition at baseline Assessment and Plan: Will discuss with primary oncologist Dr. Julien Nordmann patients' desire to transition to hospice.  We are happy to make the referral Follow Up Instructions: Follow up only as needed  I discussed the assessment and treatment plan with the patient.  The patient was provided an opportunity to ask questions and all were answered.  The patient agreed with the plan and demonstrated understanding of the instructions.    The patient was advised to call back or seek an in-person evaluation if the symptoms worsen or if the condition fails to improve as anticipated.  I provided 5-10 minutes of non-face-to-face time during this enocunter.  Ventura Sellers, MD   I provided 10 minutes of non face-to-face telephone visit time during this encounter, and > 50% was spent counseling as documented under my assessment & plan.

## 2019-08-11 ENCOUNTER — Telehealth: Payer: Self-pay | Admitting: Internal Medicine

## 2019-08-11 NOTE — Telephone Encounter (Signed)
No los per 12/21.

## 2019-08-12 ENCOUNTER — Telehealth: Payer: Self-pay | Admitting: Medical Oncology

## 2019-08-12 ENCOUNTER — Telehealth: Payer: Self-pay | Admitting: Radiation Oncology

## 2019-08-12 NOTE — Telephone Encounter (Addendum)
Referred to [place]. Dr Julien Nordmann will be attending.

## 2019-08-12 NOTE — Telephone Encounter (Signed)
  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  Interval Since Last Radiation:  4 weeks   07/01/2019-07/13/2019: The patient received 30 Gy in 10 fractions to the whole brain.  09/03/2018-09/12/2018 SBRT: The RUL tumorreceived 54 Gyin 3 fractions at 18 Gyper fraction.  The LUL tumor received 50 Gy in 5 fractions at 10 Gy per fraction.  Narrative:  The patient was contacted today for routine follow-up. During treatment she tolerate radiotherapy well. She has elected however to proceed with hospice based care and is getting set up with their team.   Impression/Plan: 1. 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 The patient has been enrolled in hospice care. I reached out to her daughter Pamela Russell, and encouraged her to let us know how we could help but encouraged her for their strength as a family in making this decision to make her mother's quality of life the priority for her care.    Carola Rhine, PAC

## 2019-08-17 ENCOUNTER — Ambulatory Visit: Payer: Medicare Other | Admitting: Internal Medicine

## 2019-08-17 ENCOUNTER — Inpatient Hospital Stay: Payer: Medicare Other

## 2019-08-17 ENCOUNTER — Ambulatory Visit: Payer: Medicare Other

## 2019-08-17 ENCOUNTER — Other Ambulatory Visit: Payer: Medicare Other

## 2019-08-17 ENCOUNTER — Inpatient Hospital Stay: Payer: Medicare Other | Admitting: Physician Assistant

## 2019-09-14 ENCOUNTER — Ambulatory Visit: Payer: Medicare Other

## 2019-09-14 ENCOUNTER — Other Ambulatory Visit: Payer: Medicare Other

## 2019-09-14 ENCOUNTER — Ambulatory Visit: Payer: Medicare Other | Admitting: Physician Assistant

## 2019-10-02 ENCOUNTER — Telehealth: Payer: Self-pay | Admitting: Medical Oncology

## 2019-10-08 NOTE — Progress Notes (Signed)
  Radiation Oncology         [phone] ________________________________  Name: GERDA YIN MRN: [id number]  Date: 07/13/2019  DOB: 09-20-1945  End of Treatment Note  Diagnosis:   brain metastasis   Indication for treatment::  palliative       Radiation treatment dates:   07/01/19 - 07/13/19  Site/dose:   The patient was treated with a course of whole brain radiation therapy to a dose of 30 Gy in 10 fractions.  This was accomplished using a 2 field technique with additional reduced fields as necessary to improve dose homogeneity.  Narrative: The patient tolerated radiation treatment relatively well.   No unexpected difficulties in terms of acute toxicity.  The skin held up to treatment fairly well.  Plan: The patient has completed radiation treatment. The patient will return to radiation oncology clinic for routine followup in one month. I advised the patient to call or return sooner if they have any questions or concerns related to their recovery or treatment. ________________________________  Jodelle Gross, M.D., Ph.D.

## 2019-10-19 NOTE — Telephone Encounter (Signed)
Pt died at 3:30 am today.

## 2019-10-19 DEATH — deceased

## 2019-11-11 ENCOUNTER — Encounter: Payer: Self-pay | Admitting: Radiation Therapy

## 2021-01-04 IMAGING — DX CHEST - 2 VIEW
2 series · 2 of 2 positions shown · non-contrast
Comparison: Radiographs October 21, 2018.  CT scan January 16, 2019.

CLINICAL DATA: Shortness of breath.

EXAM:
CHEST - 2 VIEW

[chest pa]
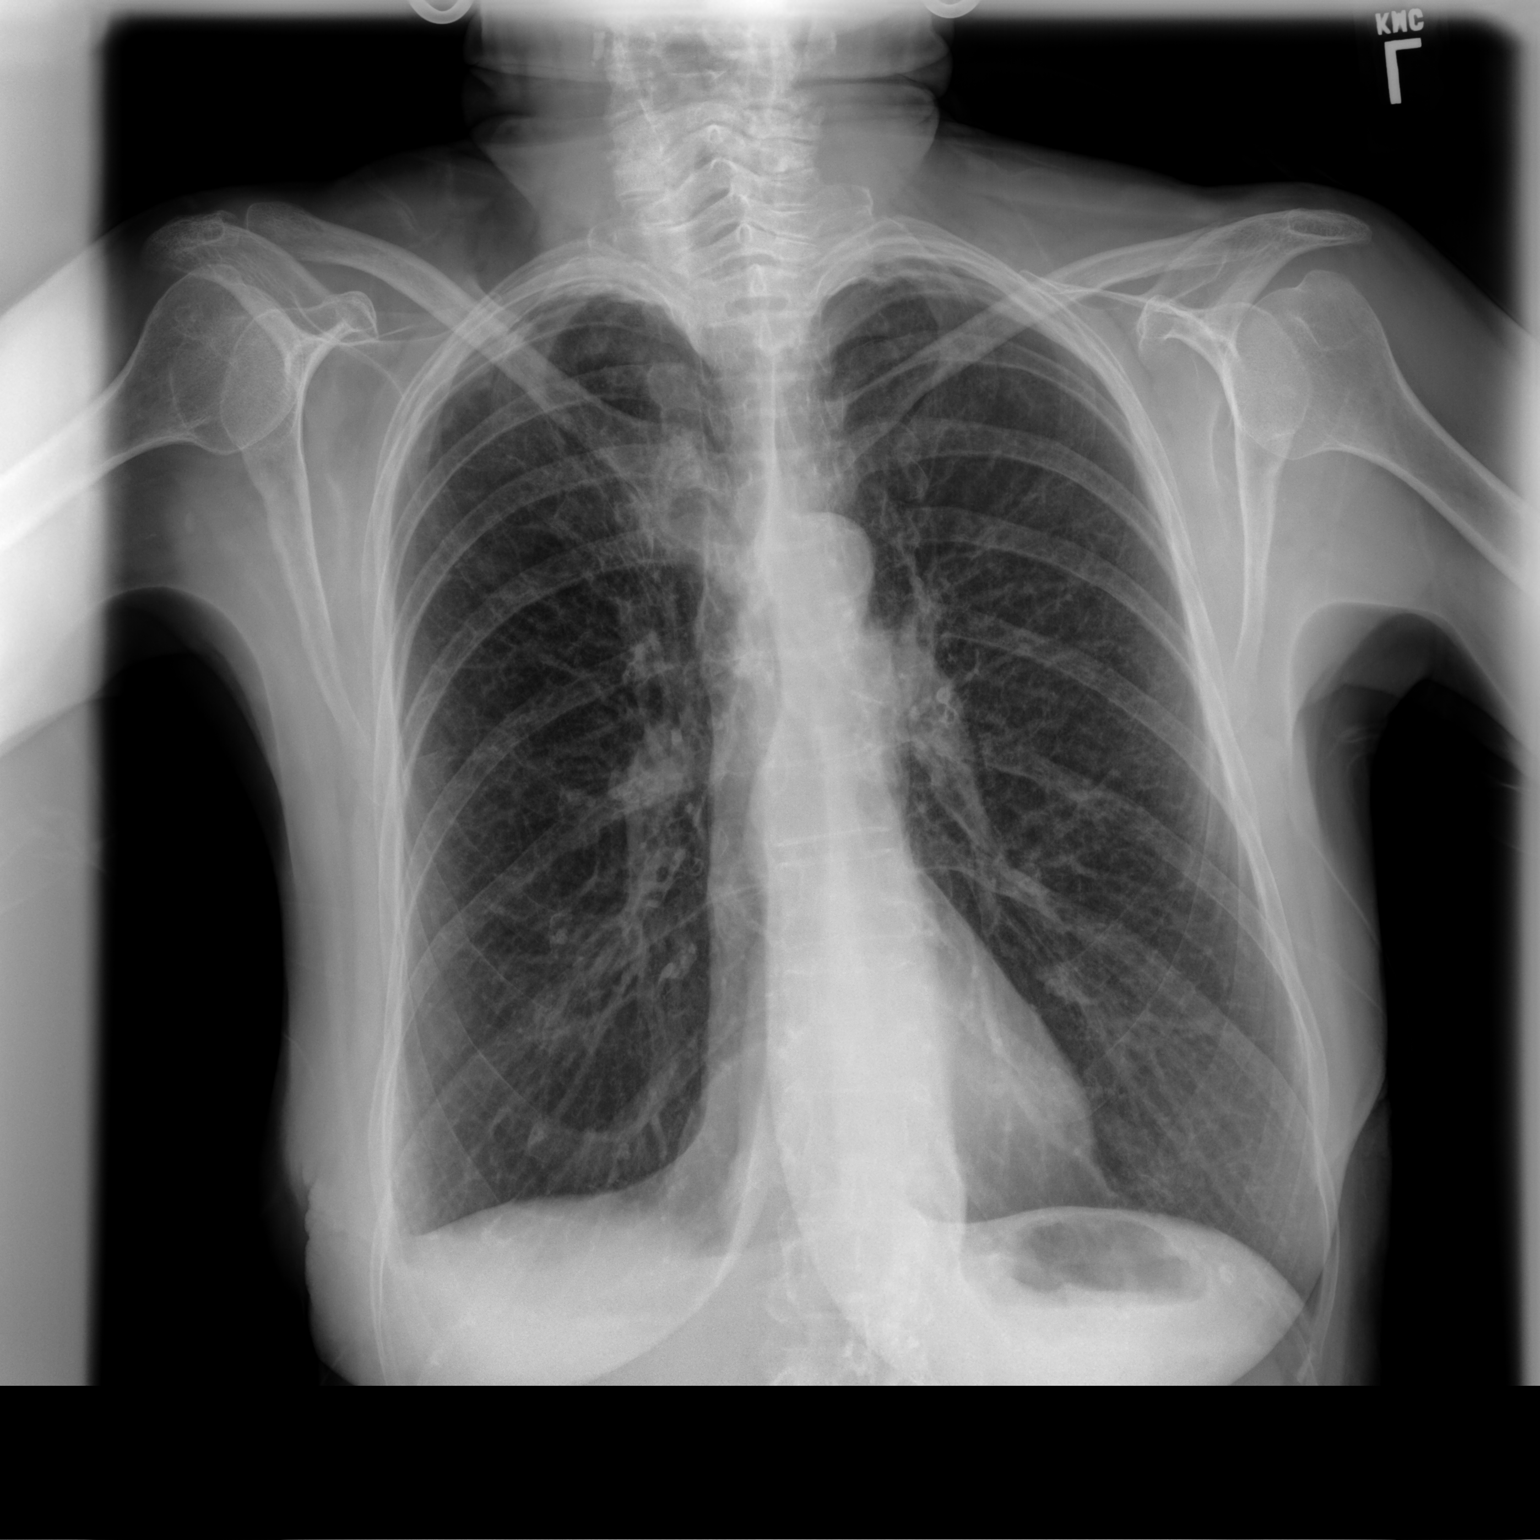

[chest lat]
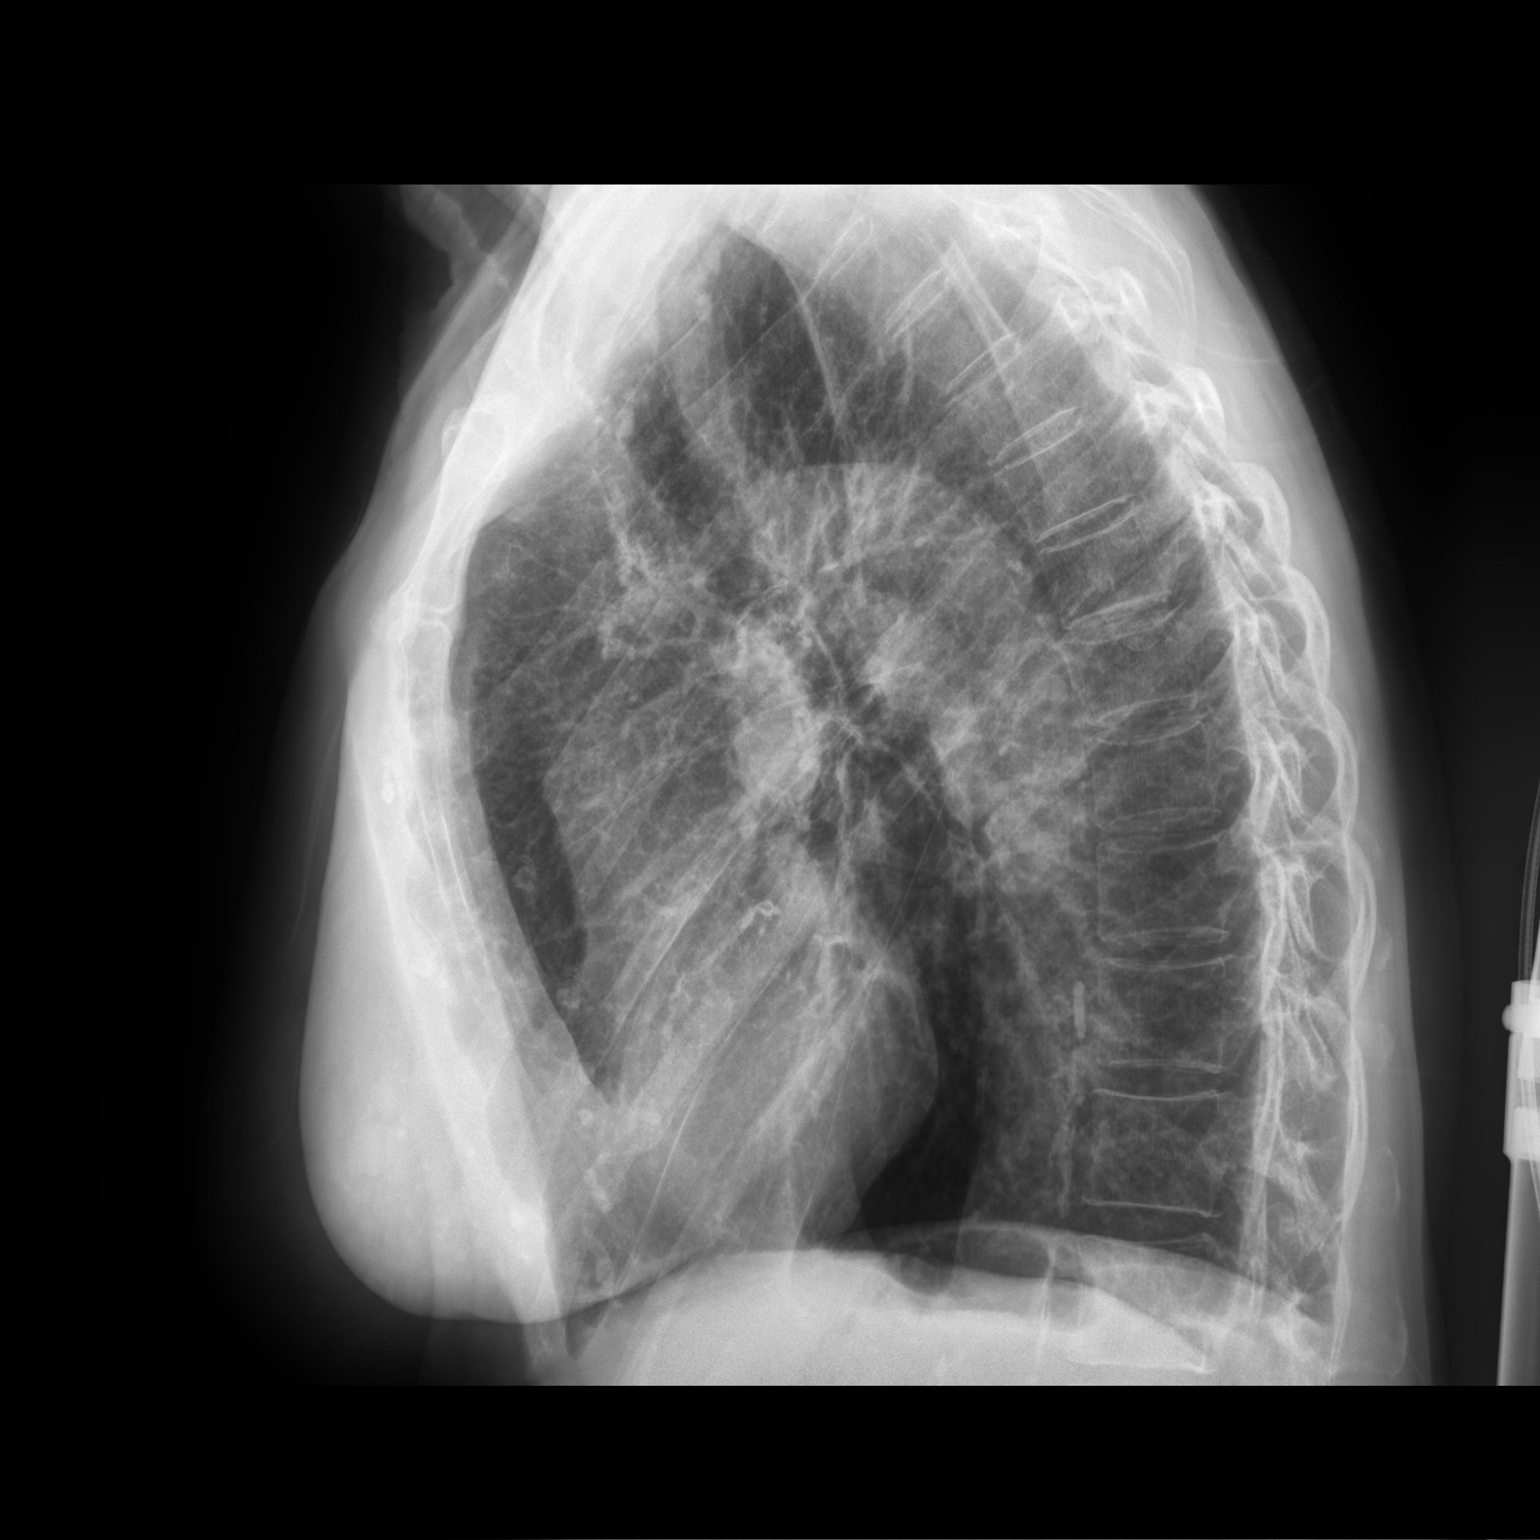

[2 of 2 positions shown; findings below may reference images not displayed]

FINDINGS: The heart size and mediastinal contours are within normal limits.
Both lungs are clear. Atherosclerosis of thoracic aorta is noted. No
pneumothorax or pleural effusion is noted. Hyperexpansion of the
lungs is noted. The visualized skeletal structures are unremarkable.
IMPRESSION: No acute cardiopulmonary abnormality seen. Hyperexpansion of the
lungs is noted consistent with chronic obstructive pulmonary
disease.

Aortic Atherosclerosis ([id number]).

## 2021-03-01 IMAGING — CT CT ANGIOGRAPHY NECK
2 of 7 series · 8 of 33 positions shown · IV contrast (ISOVUE)
Comparison: Prior head CT from earlier the same day.

CLINICAL DATA: Initial evaluation for acute diplopia.



[Series 5: cta head neck · axial · 0.44mm/px · z∈[-236,-132]mm · 2 of 158 slices shown]
[im 53/158  soft-tissue]
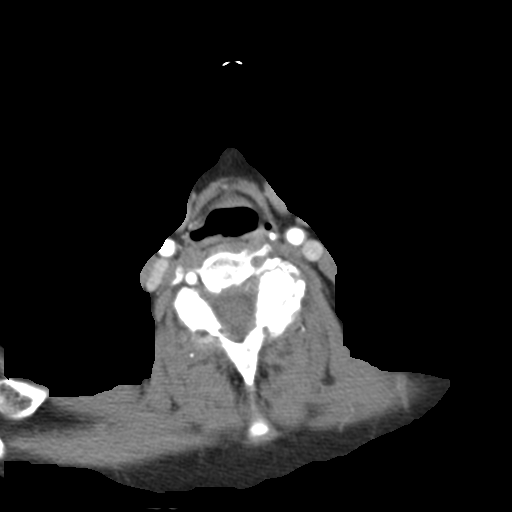
[im 105/158  soft-tissue]
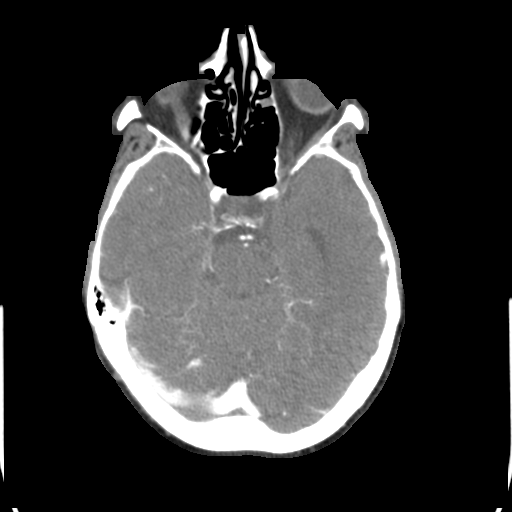

[Series 7: ax thin · axial · 0.34mm/px · z∈[-311,-80]mm · 6 of 322 slices shown]
[im 46/322  soft-tissue]
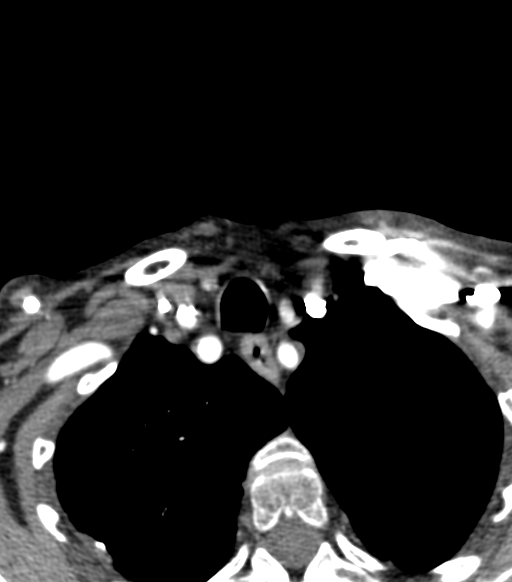
[im 92/322  bone]
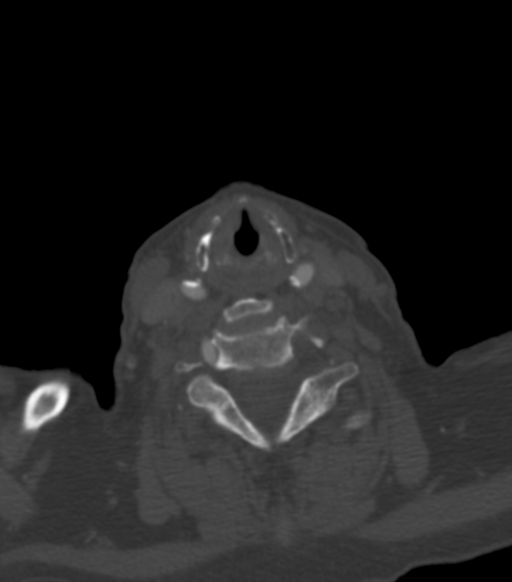
[im 138/322  soft-tissue]
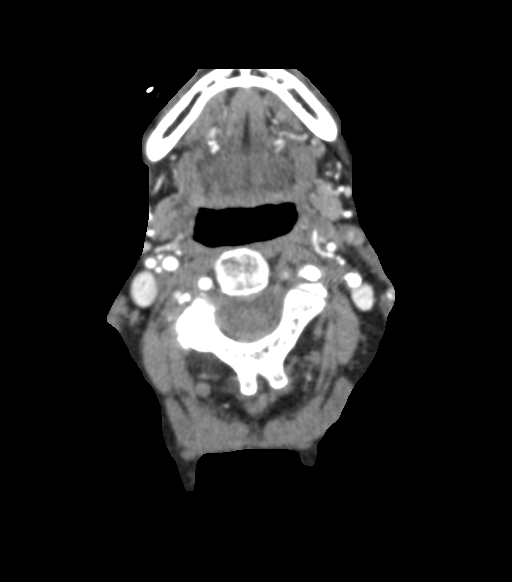
[im 184/322  bone]
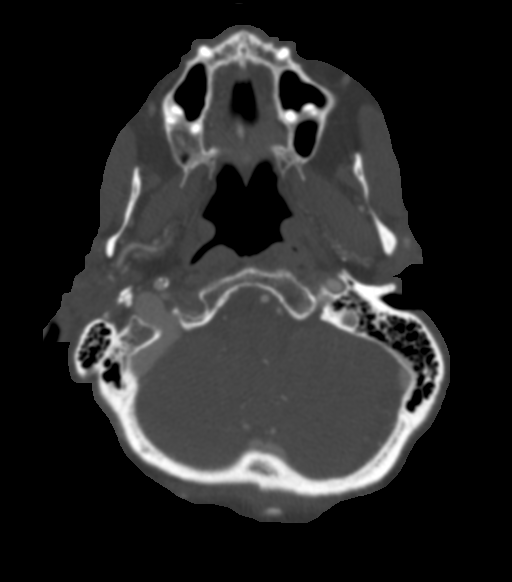
[im 230/322  soft-tissue]
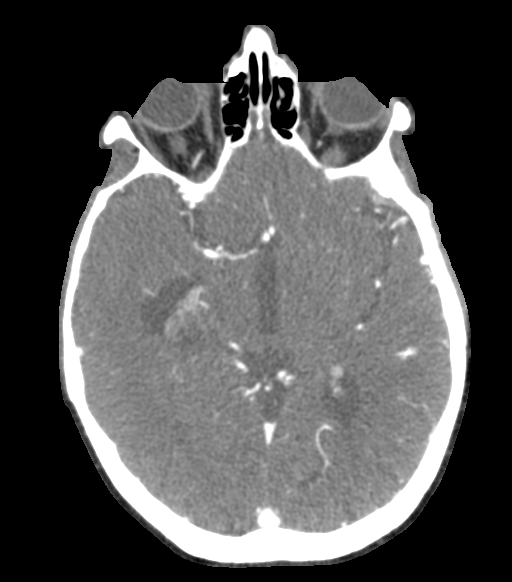
[im 276/322  bone]
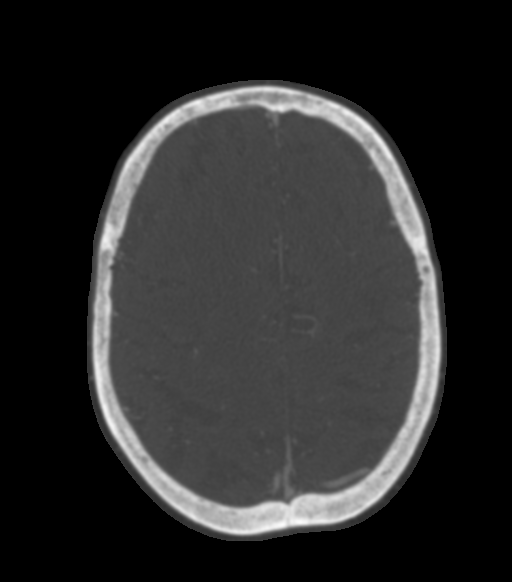

[8 of 33 positions shown; findings below may reference images not displayed]

FINDINGS: CTA NECK FINDINGS

Aortic arch: Partially visualized aortic arch of normal caliber.
Origin of the right brachiocephalic and left common carotid arteries
not visualized on this exam. Scattered atheromatous plaque within
the visualized aortic arch and about the origin of the left
subclavian artery without hemodynamically significant stenosis.

Right carotid system: Eccentric calcified plaque about the origin of
the right CCA with associated narrowing of up to approximately 40%
by NASCET criteria. Right CCA mildly tortuous with scattered
atheromatous plaque distally without high-grade stenosis. Bulky
calcified plaque about the right bifurcation/proximal right ICA.
Associated stenosis of up to approximately 50% about the origin of
the right ICA distally, right ICA mildly tortuous with scattered
atheromatous plaque without additional high-grade stenosis,
dissection, or occlusion.

Left carotid system: Origin of the left CCA not visible on this
exam. Scattered atheromatous plaque throughout the visualized left
CCA with mild multifocal narrowing. Bulky mixed plaque about the
left bifurcation/proximal left ICA with associated stenosis of up to
50% by NASCET criteria. Additional atheromatous plaque within the
distal left ICA with associated focal stenosis of up to 65% by
NASCET criteria (series 7, image 148). Left ICA otherwise patent to
the skull base.

Vertebral arteries: Both vertebral arteries arise from the
subclavian arteries. Right vertebral artery dominant. Mild scattered
atheromatous plaque within the right V1 and V2 segments with
associated mild multifocal stenosis. Right vertebral otherwise
widely patent to the skull base. Hypoplastic left vertebral artery
occluded at its origin, and remains largely occluded within the
neck. Distal reconstitution via muscular branches at the left V3
segment (series 7, image 163). Attenuated irregular flow seen within
the hypoplastic left vertebral artery as it crosses into the cranial
vault.

Skeleton: Advanced compression deformity partially seen involving
the T5 vertebral body with at least 80% height loss, age
indeterminate, but new relative to recent CT from 01/16/2019.
Irregular sclerotic lesion within the T1 vertebral body stable as
compared to multiple previous exams, and presumably benign. No other
discrete lytic or blastic osseous lesions. Moderate multilevel facet
arthrosis noted within the cervical spine, left slightly worse than
right. Degenerative changes noted about the TMJs bilaterally.

Other neck: No other acute soft tissue abnormality within the neck.
Prominent 1 cm left no noted adjacent to the left lobe of thyroid
(series 5, image 31).

Upper chest: Visualized upper chest demonstrates no acute finding.
Upper lobe predominant centrilobular emphysema. Right-sided
Port-A-Cath noted.

Review of the MIP images confirms the above findings

CTA HEAD FINDINGS

Anterior circulation: Petrous right ICA widely patent. Focal
atheromatous plaque within the petrous left ICA with associated
short-segment mild stenosis. Extensive atheromatous plaque within
the cavernous/supraclinoid ICAs with resultant moderate to severe
diffuse stenosis (estimated 50-75% bilaterally, left worse than
right). ICA termini well perfused. Right A1 patent. A hypoplastic
left A1 appears occluded. Normal anterior communicating artery.
Anterior cerebral arteries patent to their distal aspects without
stenosis or occlusion. No M1 stenosis or occlusion. Normal MCA
bifurcations. Distal MCA branches well perfused and symmetric.

Posterior circulation: Focal plaque within the dominant right
vertebral artery as it crosses into the cranial vault without
significant stenosis. Dominant right V4 segment otherwise patent to
the vertebrobasilar junction. Patent right PICA. Hypoplastic left
vertebral artery attenuated and irregular. Superimposed
short-segment moderate to severe proximal left V4 stenosis prior to
the takeoff of the left PICA (series 7, image 151). Additional
short-segment moderate stenosis within the distal left V4 segment
just prior to the vertebrobasilar junction. Left PICA patent.
Basilar irregular but widely patent to its distal aspect. Superior
cerebral arteries patent bilaterally. Both of the posterior cerebral
arteries primarily supplied via the basilar. PCAs demonstrate
scattered multifocal atheromatous irregularity but are well perfused
to their distal aspects without high-grade stenosis.

Venous sinuses: Patent.

Anatomic variants: None significant.

Review of the MIP images confirms the above findings
IMPRESSION: 1. Negative CTA for emergent large vessel occlusion.
2. Bulky atheromatous plaque about the origins of the internal
carotid arteries with associated stenoses of up to 50% by NASCET
criteria bilaterally. Additional short-segment 50% stenosis
involving the distal left ICA as above.
3. Heavy atherosclerotic change throughout the carotid siphons with
associated moderate to severe multifocal stenoses, left worse than
right.
4. Hypoplastic left vertebral artery, largely occluded in the neck,
with distal reconstitution at the V3 segment. Superimposed moderate
to severe multifocal left V4 stenoses as above.
5. Hypoplastic and occluded left A1 segment. Anterior cerebral
arteries supplied via the right carotid artery system.
6. Severe compression deformity involving the T5 vertebral body, age
indeterminate, but new relative to most recent chest CT from
01/16/2019. Correlation with history and physical exam recommended.
7. Ill-defined sclerotic lesion within the T1 vertebral body,
indeterminate, but stable relative to multiple previous exams.
8. Emphysema.

Critical Value/emergent results were called by telephone at the time
of interpretation on 03/18/2019 at [DATE] to Dr. KATERINE BUSCH ,
who verbally acknowledged these results.

## 2023-07-10 NOTE — Telephone Encounter (Signed)
Telephone call
